# Patient Record
Sex: Female | Born: 1960 | Race: White | Hispanic: No | Smoking: Current every day smoker
Health system: Southern US, Community
[De-identification: ages and names within clinical notes are randomized; demographics above are authoritative.]

## PROBLEM LIST (undated history)

## (undated) DIAGNOSIS — K219 Gastro-esophageal reflux disease without esophagitis: Secondary | ICD-10-CM

## (undated) DIAGNOSIS — J40 Bronchitis, not specified as acute or chronic: Secondary | ICD-10-CM

## (undated) DIAGNOSIS — M5136 Other intervertebral disc degeneration, lumbar region: Secondary | ICD-10-CM

## (undated) DIAGNOSIS — M1990 Unspecified osteoarthritis, unspecified site: Secondary | ICD-10-CM

## (undated) DIAGNOSIS — F329 Major depressive disorder, single episode, unspecified: Secondary | ICD-10-CM

## (undated) DIAGNOSIS — R221 Localized swelling, mass and lump, neck: Secondary | ICD-10-CM

## (undated) DIAGNOSIS — E042 Nontoxic multinodular goiter: Secondary | ICD-10-CM

## (undated) DIAGNOSIS — E039 Hypothyroidism, unspecified: Secondary | ICD-10-CM

## (undated) DIAGNOSIS — F4310 Post-traumatic stress disorder, unspecified: Secondary | ICD-10-CM

## (undated) DIAGNOSIS — Z8719 Personal history of other diseases of the digestive system: Secondary | ICD-10-CM

## (undated) DIAGNOSIS — IMO0002 Reserved for concepts with insufficient information to code with codable children: Secondary | ICD-10-CM

## (undated) DIAGNOSIS — Z951 Presence of aortocoronary bypass graft: Secondary | ICD-10-CM

## (undated) DIAGNOSIS — G4730 Sleep apnea, unspecified: Secondary | ICD-10-CM

## (undated) DIAGNOSIS — M51369 Other intervertebral disc degeneration, lumbar region without mention of lumbar back pain or lower extremity pain: Secondary | ICD-10-CM

## (undated) DIAGNOSIS — G5603 Carpal tunnel syndrome, bilateral upper limbs: Secondary | ICD-10-CM

## (undated) DIAGNOSIS — G8929 Other chronic pain: Secondary | ICD-10-CM

## (undated) DIAGNOSIS — H269 Unspecified cataract: Secondary | ICD-10-CM

## (undated) DIAGNOSIS — G709 Myoneural disorder, unspecified: Secondary | ICD-10-CM

## (undated) DIAGNOSIS — T7840XA Allergy, unspecified, initial encounter: Secondary | ICD-10-CM

## (undated) DIAGNOSIS — F319 Bipolar disorder, unspecified: Secondary | ICD-10-CM

## (undated) DIAGNOSIS — I10 Essential (primary) hypertension: Secondary | ICD-10-CM

## (undated) DIAGNOSIS — I2510 Atherosclerotic heart disease of native coronary artery without angina pectoris: Secondary | ICD-10-CM

## (undated) DIAGNOSIS — E7800 Pure hypercholesterolemia, unspecified: Secondary | ICD-10-CM

## (undated) DIAGNOSIS — I219 Acute myocardial infarction, unspecified: Secondary | ICD-10-CM

## (undated) DIAGNOSIS — M549 Dorsalgia, unspecified: Secondary | ICD-10-CM

## (undated) DIAGNOSIS — R0989 Other specified symptoms and signs involving the circulatory and respiratory systems: Secondary | ICD-10-CM

## (undated) DIAGNOSIS — F419 Anxiety disorder, unspecified: Secondary | ICD-10-CM

## (undated) DIAGNOSIS — R9430 Abnormal result of cardiovascular function study, unspecified: Secondary | ICD-10-CM

## (undated) DIAGNOSIS — J189 Pneumonia, unspecified organism: Secondary | ICD-10-CM

## (undated) DIAGNOSIS — F32A Depression, unspecified: Secondary | ICD-10-CM

## (undated) HISTORY — DX: Reserved for concepts with insufficient information to code with codable children: IMO0002

## (undated) HISTORY — DX: Unspecified osteoarthritis, unspecified site: M19.90

## (undated) HISTORY — DX: Nontoxic multinodular goiter: E04.2

## (undated) HISTORY — DX: Dorsalgia, unspecified: M54.9

## (undated) HISTORY — PX: BACK SURGERY: SHX140

## (undated) HISTORY — PX: TUBAL LIGATION: SHX77

## (undated) HISTORY — PX: BREAST SURGERY: SHX581

## (undated) HISTORY — DX: Major depressive disorder, single episode, unspecified: F32.9

## (undated) HISTORY — DX: Localized swelling, mass and lump, neck: R22.1

## (undated) HISTORY — PX: CHOLECYSTECTOMY: SHX55

## (undated) HISTORY — PX: COLONOSCOPY: SHX174

## (undated) HISTORY — DX: Depression, unspecified: F32.A

## (undated) HISTORY — DX: Allergy, unspecified, initial encounter: T78.40XA

## (undated) HISTORY — DX: Other chronic pain: G89.29

## (undated) HISTORY — PX: BREAST EXCISIONAL BIOPSY: SUR124

## (undated) HISTORY — DX: Acute myocardial infarction, unspecified: I21.9

## (undated) HISTORY — PX: WRIST SURGERY: SHX841

## (undated) HISTORY — DX: Myoneural disorder, unspecified: G70.9

## (undated) HISTORY — DX: Unspecified cataract: H26.9

## (undated) HISTORY — DX: Other specified symptoms and signs involving the circulatory and respiratory systems: R09.89

## (undated) HISTORY — DX: Abnormal result of cardiovascular function study, unspecified: R94.30

## (undated) HISTORY — DX: Presence of aortocoronary bypass graft: Z95.1

---

## 1982-05-01 HISTORY — PX: ABDOMINAL HYSTERECTOMY: SHX81

## 1999-01-24 ENCOUNTER — Encounter: Admission: RE | Admit: 1999-01-24 | Discharge: 1999-04-24 | Payer: Self-pay

## 1999-05-17 ENCOUNTER — Encounter: Payer: Self-pay | Admitting: Emergency Medicine

## 1999-05-17 ENCOUNTER — Emergency Department (HOSPITAL_COMMUNITY): Admission: EM | Admit: 1999-05-17 | Discharge: 1999-05-17 | Payer: Self-pay | Admitting: Emergency Medicine

## 2000-10-17 ENCOUNTER — Other Ambulatory Visit: Admission: RE | Admit: 2000-10-17 | Discharge: 2000-10-17 | Payer: Self-pay | Admitting: Internal Medicine

## 2000-11-13 ENCOUNTER — Encounter: Admission: RE | Admit: 2000-11-13 | Discharge: 2000-11-13 | Payer: Self-pay | Admitting: Internal Medicine

## 2000-11-13 ENCOUNTER — Encounter: Payer: Self-pay | Admitting: Internal Medicine

## 2000-12-17 ENCOUNTER — Emergency Department (HOSPITAL_COMMUNITY): Admission: EM | Admit: 2000-12-17 | Discharge: 2000-12-17 | Payer: Self-pay | Admitting: *Deleted

## 2000-12-17 ENCOUNTER — Encounter: Payer: Self-pay | Admitting: *Deleted

## 2001-05-01 ENCOUNTER — Emergency Department (HOSPITAL_COMMUNITY): Admission: EM | Admit: 2001-05-01 | Discharge: 2001-05-01 | Payer: Self-pay | Admitting: Emergency Medicine

## 2001-05-21 ENCOUNTER — Emergency Department (HOSPITAL_COMMUNITY): Admission: EM | Admit: 2001-05-21 | Discharge: 2001-05-21 | Payer: Self-pay | Admitting: Emergency Medicine

## 2001-05-27 ENCOUNTER — Ambulatory Visit (HOSPITAL_COMMUNITY): Admission: RE | Admit: 2001-05-27 | Discharge: 2001-05-27 | Payer: Self-pay | Admitting: Orthopedic Surgery

## 2001-05-27 ENCOUNTER — Encounter: Payer: Self-pay | Admitting: Orthopedic Surgery

## 2001-05-28 ENCOUNTER — Emergency Department (HOSPITAL_COMMUNITY): Admission: EM | Admit: 2001-05-28 | Discharge: 2001-05-28 | Payer: Self-pay | Admitting: Emergency Medicine

## 2001-09-19 ENCOUNTER — Emergency Department (HOSPITAL_COMMUNITY): Admission: EM | Admit: 2001-09-19 | Discharge: 2001-09-20 | Payer: Self-pay | Admitting: Emergency Medicine

## 2002-08-02 ENCOUNTER — Ambulatory Visit (HOSPITAL_COMMUNITY): Admission: RE | Admit: 2002-08-02 | Discharge: 2002-08-02 | Payer: Self-pay | Admitting: Orthopedic Surgery

## 2002-08-02 ENCOUNTER — Encounter: Payer: Self-pay | Admitting: Orthopedic Surgery

## 2003-08-18 ENCOUNTER — Emergency Department (HOSPITAL_COMMUNITY): Admission: EM | Admit: 2003-08-18 | Discharge: 2003-08-18 | Payer: Self-pay | Admitting: Emergency Medicine

## 2003-10-07 ENCOUNTER — Emergency Department (HOSPITAL_COMMUNITY): Admission: EM | Admit: 2003-10-07 | Discharge: 2003-10-07 | Payer: Self-pay | Admitting: Emergency Medicine

## 2003-12-12 ENCOUNTER — Emergency Department (HOSPITAL_COMMUNITY): Admission: EM | Admit: 2003-12-12 | Discharge: 2003-12-12 | Payer: Self-pay | Admitting: Emergency Medicine

## 2005-09-04 ENCOUNTER — Emergency Department (HOSPITAL_COMMUNITY): Admission: EM | Admit: 2005-09-04 | Discharge: 2005-09-04 | Payer: Self-pay | Admitting: Emergency Medicine

## 2006-05-01 HISTORY — PX: CORONARY ARTERY BYPASS GRAFT: SHX141

## 2006-11-09 ENCOUNTER — Emergency Department (HOSPITAL_COMMUNITY): Admission: EM | Admit: 2006-11-09 | Discharge: 2006-11-09 | Payer: Self-pay | Admitting: Emergency Medicine

## 2006-11-20 ENCOUNTER — Ambulatory Visit: Payer: Self-pay | Admitting: Internal Medicine

## 2006-11-20 ENCOUNTER — Inpatient Hospital Stay (HOSPITAL_COMMUNITY): Admission: EM | Admit: 2006-11-20 | Discharge: 2006-11-28 | Payer: Self-pay | Admitting: Emergency Medicine

## 2006-11-20 DIAGNOSIS — I219 Acute myocardial infarction, unspecified: Secondary | ICD-10-CM

## 2006-11-20 HISTORY — DX: Acute myocardial infarction, unspecified: I21.9

## 2006-11-21 ENCOUNTER — Ambulatory Visit: Payer: Self-pay | Admitting: Cardiothoracic Surgery

## 2006-11-28 ENCOUNTER — Ambulatory Visit: Payer: Self-pay | Admitting: *Deleted

## 2006-12-19 ENCOUNTER — Ambulatory Visit: Payer: Self-pay | Admitting: Cardiology

## 2006-12-19 LAB — CONVERTED CEMR LAB
Basophils Absolute: 0 10*3/uL (ref 0.0–0.1)
Calcium: 9.2 mg/dL (ref 8.4–10.5)
Chloride: 107 meq/L (ref 96–112)
Eosinophils Absolute: 0.3 10*3/uL (ref 0.0–0.6)
Eosinophils Relative: 4.2 % (ref 0.0–5.0)
GFR calc non Af Amer: 96 mL/min
MCHC: 34.6 g/dL (ref 30.0–36.0)
MCV: 86.3 fL (ref 78.0–100.0)
Platelets: 265 10*3/uL (ref 150–400)
RBC: 4.23 M/uL (ref 3.87–5.11)
WBC: 7.1 10*3/uL (ref 4.5–10.5)

## 2006-12-21 ENCOUNTER — Encounter: Admission: RE | Admit: 2006-12-21 | Discharge: 2006-12-21 | Payer: Self-pay | Admitting: Cardiothoracic Surgery

## 2006-12-21 ENCOUNTER — Ambulatory Visit: Payer: Self-pay | Admitting: Cardiothoracic Surgery

## 2006-12-25 ENCOUNTER — Ambulatory Visit: Payer: Self-pay | Admitting: Internal Medicine

## 2006-12-28 ENCOUNTER — Ambulatory Visit: Payer: Self-pay | Admitting: Cardiothoracic Surgery

## 2007-01-04 ENCOUNTER — Ambulatory Visit: Payer: Self-pay | Admitting: Cardiothoracic Surgery

## 2007-01-18 ENCOUNTER — Ambulatory Visit: Payer: Self-pay | Admitting: Cardiology

## 2007-02-06 ENCOUNTER — Ambulatory Visit: Payer: Self-pay | Admitting: Internal Medicine

## 2007-02-06 LAB — CONVERTED CEMR LAB
ALT: 8 U/L
AST: 13 U/L
Albumin: 4.2 g/dL
Alkaline Phosphatase: 119 U/L — ABNORMAL HIGH
BUN: 16 mg/dL
CO2: 19 meq/L
Calcium: 9 mg/dL
Chloride: 110 meq/L
Cholesterol: 114 mg/dL
Creatinine, Ser: 0.7 mg/dL
Glucose, Bld: 111 mg/dL — ABNORMAL HIGH
HDL: 28 mg/dL — ABNORMAL LOW
LDL Cholesterol: 61 mg/dL
Potassium: 4 meq/L
Sodium: 142 meq/L
Total Bilirubin: 0.4 mg/dL
Total CHOL/HDL Ratio: 4.1
Total Protein: 6.9 g/dL
Triglycerides: 125 mg/dL
VLDL: 25 mg/dL

## 2007-02-14 ENCOUNTER — Ambulatory Visit: Payer: Self-pay | Admitting: Internal Medicine

## 2007-02-14 ENCOUNTER — Encounter (HOSPITAL_COMMUNITY): Admission: RE | Admit: 2007-02-14 | Discharge: 2007-05-01 | Payer: Self-pay | Admitting: Cardiology

## 2007-02-25 ENCOUNTER — Ambulatory Visit: Payer: Self-pay | Admitting: Cardiology

## 2007-04-19 ENCOUNTER — Ambulatory Visit: Payer: Self-pay

## 2007-05-01 ENCOUNTER — Ambulatory Visit (HOSPITAL_COMMUNITY): Admission: RE | Admit: 2007-05-01 | Discharge: 2007-05-02 | Payer: Self-pay | Admitting: Specialist

## 2007-05-02 ENCOUNTER — Encounter (HOSPITAL_COMMUNITY): Admission: RE | Admit: 2007-05-02 | Discharge: 2007-06-29 | Payer: Self-pay | Admitting: Cardiology

## 2007-05-02 HISTORY — PX: KNEE SURGERY: SHX244

## 2007-05-17 ENCOUNTER — Ambulatory Visit: Payer: Self-pay | Admitting: Internal Medicine

## 2007-05-17 LAB — CONVERTED CEMR LAB
Basophils Relative: 0 % (ref 0–1)
Hemoglobin: 15 g/dL (ref 12.0–15.0)
Lymphs Abs: 2.7 10*3/uL (ref 0.7–4.0)
MCHC: 32.1 g/dL (ref 30.0–36.0)
MCV: 92.1 fL (ref 78.0–100.0)
Monocytes Absolute: 0.5 10*3/uL (ref 0.1–1.0)
Monocytes Relative: 5 % (ref 3–12)
Neutro Abs: 5.9 10*3/uL (ref 1.7–7.7)
RBC: 5.07 M/uL (ref 3.87–5.11)

## 2007-08-02 ENCOUNTER — Ambulatory Visit: Payer: Self-pay | Admitting: Cardiology

## 2007-09-02 ENCOUNTER — Ambulatory Visit: Payer: Self-pay | Admitting: Internal Medicine

## 2007-09-02 ENCOUNTER — Encounter (INDEPENDENT_AMBULATORY_CARE_PROVIDER_SITE_OTHER): Payer: Self-pay | Admitting: Family Medicine

## 2007-09-03 ENCOUNTER — Ambulatory Visit (HOSPITAL_COMMUNITY): Admission: RE | Admit: 2007-09-03 | Discharge: 2007-09-03 | Payer: Self-pay | Admitting: Family Medicine

## 2008-04-17 ENCOUNTER — Ambulatory Visit: Payer: Self-pay | Admitting: Internal Medicine

## 2008-05-08 ENCOUNTER — Ambulatory Visit: Payer: Self-pay | Admitting: Cardiology

## 2008-05-21 ENCOUNTER — Ambulatory Visit: Payer: Self-pay | Admitting: Internal Medicine

## 2008-05-21 ENCOUNTER — Encounter (INDEPENDENT_AMBULATORY_CARE_PROVIDER_SITE_OTHER): Payer: Self-pay | Admitting: Adult Health

## 2008-05-21 LAB — CONVERTED CEMR LAB
Albumin: 4.1 g/dL (ref 3.5–5.2)
CO2: 18 meq/L — ABNORMAL LOW (ref 19–32)
Calcium: 9 mg/dL (ref 8.4–10.5)
Chloride: 108 meq/L (ref 96–112)
Cholesterol: 128 mg/dL (ref 0–200)
Glucose, Bld: 119 mg/dL — ABNORMAL HIGH (ref 70–99)
Sodium: 140 meq/L (ref 135–145)
Total Bilirubin: 0.6 mg/dL (ref 0.3–1.2)
Total Protein: 6.8 g/dL (ref 6.0–8.3)
Triglycerides: 153 mg/dL — ABNORMAL HIGH (ref <150)
VLDL: 31 mg/dL (ref 0–40)

## 2008-07-20 ENCOUNTER — Ambulatory Visit: Payer: Self-pay | Admitting: Internal Medicine

## 2008-08-05 ENCOUNTER — Encounter: Admission: RE | Admit: 2008-08-05 | Discharge: 2008-08-05 | Payer: Self-pay | Admitting: Internal Medicine

## 2008-09-23 ENCOUNTER — Ambulatory Visit: Payer: Self-pay | Admitting: Internal Medicine

## 2008-09-24 ENCOUNTER — Ambulatory Visit: Payer: Self-pay | Admitting: Internal Medicine

## 2008-10-01 ENCOUNTER — Ambulatory Visit (HOSPITAL_COMMUNITY): Admission: RE | Admit: 2008-10-01 | Discharge: 2008-10-01 | Payer: Self-pay | Admitting: Internal Medicine

## 2008-10-27 ENCOUNTER — Ambulatory Visit: Payer: Self-pay | Admitting: Internal Medicine

## 2008-10-29 ENCOUNTER — Ambulatory Visit: Payer: Self-pay | Admitting: Family Medicine

## 2008-11-05 ENCOUNTER — Ambulatory Visit: Payer: Self-pay | Admitting: Internal Medicine

## 2008-11-19 ENCOUNTER — Ambulatory Visit: Payer: Self-pay | Admitting: Internal Medicine

## 2008-11-20 ENCOUNTER — Emergency Department (HOSPITAL_COMMUNITY): Admission: EM | Admit: 2008-11-20 | Discharge: 2008-11-20 | Payer: Self-pay | Admitting: Emergency Medicine

## 2008-11-20 DIAGNOSIS — F17200 Nicotine dependence, unspecified, uncomplicated: Secondary | ICD-10-CM | POA: Insufficient documentation

## 2008-11-21 ENCOUNTER — Emergency Department (HOSPITAL_COMMUNITY): Admission: EM | Admit: 2008-11-21 | Discharge: 2008-11-21 | Payer: Self-pay | Admitting: Emergency Medicine

## 2008-11-27 ENCOUNTER — Ambulatory Visit: Payer: Self-pay | Admitting: Cardiology

## 2008-12-10 ENCOUNTER — Ambulatory Visit: Payer: Self-pay | Admitting: Internal Medicine

## 2009-01-13 ENCOUNTER — Encounter (INDEPENDENT_AMBULATORY_CARE_PROVIDER_SITE_OTHER): Payer: Self-pay | Admitting: Adult Health

## 2009-01-13 ENCOUNTER — Ambulatory Visit: Payer: Self-pay | Admitting: Internal Medicine

## 2009-01-13 LAB — CONVERTED CEMR LAB
ALT: 10 U/L
AST: 18 U/L
Albumin: 4.2 g/dL
Alkaline Phosphatase: 98 U/L
BUN: 18 mg/dL
CO2: 20 meq/L
Calcium: 8.9 mg/dL
Chloride: 108 meq/L
Cholesterol: 128 mg/dL
Creatinine, Ser: 0.69 mg/dL
Glucose, Bld: 142 mg/dL — ABNORMAL HIGH
HDL: 32 mg/dL — ABNORMAL LOW
LDL Cholesterol: 69 mg/dL
Potassium: 4.1 meq/L
Sodium: 140 meq/L
Total Bilirubin: 0.4 mg/dL
Total CHOL/HDL Ratio: 4
Total Protein: 6.6 g/dL
Triglycerides: 135 mg/dL
VLDL: 27 mg/dL

## 2009-01-27 ENCOUNTER — Ambulatory Visit: Payer: Self-pay | Admitting: Internal Medicine

## 2009-01-27 ENCOUNTER — Ambulatory Visit (HOSPITAL_COMMUNITY): Admission: RE | Admit: 2009-01-27 | Discharge: 2009-01-27 | Payer: Self-pay | Admitting: Internal Medicine

## 2009-01-27 ENCOUNTER — Emergency Department (HOSPITAL_COMMUNITY): Admission: EM | Admit: 2009-01-27 | Discharge: 2009-01-27 | Payer: Self-pay | Admitting: Emergency Medicine

## 2009-02-03 ENCOUNTER — Ambulatory Visit: Payer: Self-pay | Admitting: Internal Medicine

## 2009-02-11 ENCOUNTER — Ambulatory Visit: Payer: Self-pay | Admitting: Internal Medicine

## 2009-04-28 ENCOUNTER — Ambulatory Visit: Payer: Self-pay | Admitting: Internal Medicine

## 2009-04-28 ENCOUNTER — Encounter (INDEPENDENT_AMBULATORY_CARE_PROVIDER_SITE_OTHER): Payer: Self-pay | Admitting: Adult Health

## 2009-04-28 LAB — CONVERTED CEMR LAB
ALT: 9 units/L (ref 0–35)
CO2: 20 meq/L (ref 19–32)
Calcium: 9.4 mg/dL (ref 8.4–10.5)
Chloride: 106 meq/L (ref 96–112)
Cholesterol: 150 mg/dL (ref 0–200)
Creatinine, Ser: 0.66 mg/dL (ref 0.40–1.20)
Glucose, Bld: 108 mg/dL — ABNORMAL HIGH (ref 70–99)
Total Bilirubin: 0.6 mg/dL (ref 0.3–1.2)
Total CHOL/HDL Ratio: 4.7
Total Protein: 7 g/dL (ref 6.0–8.3)
Triglycerides: 207 mg/dL — ABNORMAL HIGH (ref <150)
VLDL: 41 mg/dL — ABNORMAL HIGH (ref 0–40)

## 2009-05-06 ENCOUNTER — Ambulatory Visit (HOSPITAL_COMMUNITY): Admission: RE | Admit: 2009-05-06 | Discharge: 2009-05-06 | Payer: Self-pay | Admitting: Family Medicine

## 2009-07-28 ENCOUNTER — Ambulatory Visit: Payer: Self-pay | Admitting: Internal Medicine

## 2009-09-29 ENCOUNTER — Ambulatory Visit: Payer: Self-pay | Admitting: Internal Medicine

## 2009-09-29 ENCOUNTER — Encounter (INDEPENDENT_AMBULATORY_CARE_PROVIDER_SITE_OTHER): Payer: Self-pay | Admitting: Adult Health

## 2009-09-29 LAB — CONVERTED CEMR LAB
BUN: 14 mg/dL (ref 6–23)
Basophils Relative: 1 % (ref 0–1)
CO2: 23 meq/L (ref 19–32)
Calcium: 9.9 mg/dL (ref 8.4–10.5)
Chloride: 106 meq/L (ref 96–112)
Cholesterol: 123 mg/dL (ref 0–200)
Creatinine, Ser: 0.59 mg/dL (ref 0.40–1.20)
Eosinophils Absolute: 0.1 10*3/uL (ref 0.0–0.7)
Eosinophils Relative: 1 % (ref 0–5)
Glucose, Bld: 104 mg/dL — ABNORMAL HIGH (ref 70–99)
HCT: 43.6 % (ref 36.0–46.0)
HDL: 35 mg/dL — ABNORMAL LOW (ref >39)
Lymphs Abs: 3.4 10*3/uL (ref 0.7–4.0)
MCHC: 31.9 g/dL (ref 30.0–36.0)
MCV: 89.5 fL (ref 78.0–100.0)
Monocytes Relative: 5 % (ref 3–12)
Neutrophils Relative %: 56 % (ref 43–77)
RBC: 4.87 M/uL (ref 3.87–5.11)
Total Bilirubin: 0.4 mg/dL (ref 0.3–1.2)
Total CHOL/HDL Ratio: 3.5
Triglycerides: 133 mg/dL (ref <150)
VLDL: 27 mg/dL (ref 0–40)
WBC: 9.3 10*3/uL (ref 4.0–10.5)

## 2009-09-30 ENCOUNTER — Ambulatory Visit: Payer: Self-pay | Admitting: Internal Medicine

## 2009-10-12 ENCOUNTER — Ambulatory Visit: Payer: Self-pay | Admitting: Internal Medicine

## 2009-10-13 ENCOUNTER — Ambulatory Visit: Payer: Self-pay | Admitting: Internal Medicine

## 2009-11-03 ENCOUNTER — Ambulatory Visit: Payer: Self-pay | Admitting: Cardiology

## 2009-12-28 ENCOUNTER — Ambulatory Visit: Payer: Self-pay | Admitting: Internal Medicine

## 2009-12-29 ENCOUNTER — Ambulatory Visit: Payer: Self-pay | Admitting: Family Medicine

## 2010-02-08 ENCOUNTER — Ambulatory Visit (HOSPITAL_COMMUNITY): Admission: RE | Admit: 2010-02-08 | Discharge: 2010-02-08 | Payer: Self-pay | Admitting: Family Medicine

## 2010-04-26 ENCOUNTER — Encounter (INDEPENDENT_AMBULATORY_CARE_PROVIDER_SITE_OTHER): Payer: Self-pay | Admitting: *Deleted

## 2010-04-26 LAB — CONVERTED CEMR LAB
ALT: <8 units/L (ref 0–35)
AST: 13 units/L (ref 0–37)
Albumin ELP: 58.2 % (ref 55.8–66.1)
Alpha-1-Globulin: 5.4 % — ABNORMAL HIGH (ref 2.9–4.9)
Anti Nuclear Antibody(ANA): NEGATIVE
Beta Globulin: 6.5 % (ref 4.7–7.2)
CO2: 23 meq/L (ref 19–32)
Calcium: 9.2 mg/dL (ref 8.4–10.5)
Chloride: 109 meq/L (ref 96–112)
Creatinine, Ser: 0.7 mg/dL (ref 0.40–1.20)
HCV Ab: NEGATIVE
Hep B Core Total Ab: NEGATIVE
Potassium: 3.6 meq/L (ref 3.5–5.3)
Sed Rate: 19 mm/hr (ref 0–22)
Sodium: 142 meq/L (ref 135–145)
Total Protein, Serum Electrophoresis: 6.3 g/dL (ref 6.0–8.3)
Total Protein: 6.3 g/dL (ref 6.0–8.3)

## 2010-05-22 ENCOUNTER — Encounter: Payer: Self-pay | Admitting: Cardiothoracic Surgery

## 2010-05-23 ENCOUNTER — Encounter: Payer: Self-pay | Admitting: Internal Medicine

## 2010-05-31 NOTE — Assessment & Plan Note (Signed)
Summary: f1y   Visit Type:  1 year follow up  CC:  No cardiac complains.  History of Present Illness: Stable at current time.  Doing reasonably well, and staying active.  Unfortunately, continues to smoke.  Restarted when her daughter got sick, and has not been able to quit.  Does stay very active.    Current Medications (verified): 1)  Fish Oil 1200 Mg Caps (Omega-3 Fatty Acids) .... Take 1 Capsule By Mouth Once A Day 2)  Aspirin 81 Mg Tbec (Aspirin) .... Take One Tablet By Mouth Daily 3)  Toprol Xl 25 Mg Xr24h-Tab (Metoprolol Succinate) .... Take 1 Tablet By Mouth Once A Day 4)  Lipitor 40 Mg Tabs (Atorvastatin Calcium) .... Take One Tablet By Mouth Daily. 5)  Claritin 10 Mg Tabs (Loratadine) .... As Needed 6)  Neurontin 100 Mg Caps (Gabapentin) .... Take 1 Capsule By Mouth Two Times A Day 7)  Calcium Carbonate-Vitamin D 600-400 Mg-Unit  Tabs (Calcium Carbonate-Vitamin D) .... Take 1 Tablet By Mouth Two Times A Day 8)  Multivitamins   Tabs (Multiple Vitamin) .... Take 1 Tablet By Mouth Once A Day 9)  Cymbalta 60 Mg Cpep (Duloxetine Hcl) .... Take 1 Capsule By Mouth Once A Day 10)  Fish Oil 1000 Mg Caps (Omega-3 Fatty Acids) .... Take 1 Capsule By Mouth Two Times A Day  Allergies: 1)  ! Morphine 2)  ! Ibuprofen 3)  ! * Steroids By Mouth 4)  ! * Bees  Past History:  Past Medical History: Last updated: 11/20/2008 Current Problems:  CAD (ICD-414.00)- HYPERCHOLESTEROLEMIA (ICD-272.0) DEPRESSION (ICD-311) BACK PAIN, CHRONIC (ICD-724.5) TOBACCO ABUSE (ICD-305.1)  Past Surgical History: Last updated: 11/20/2008   Left knee arthroscopy- 2008  coronary artery bypass grafting x3 (left internal mammary artery to LAD, saphenous vein graft to RV marginal, saphenous vein graft to posterior descending).  -2008  hysterectomy  jaw surgery.        Family History: Last updated: 11/20/2008  Notable for coronary artery disease in both her mother   and her father.   Social  History: Last updated: 11/20/2008  She is married.  She has a39 year old daughter who has   recurrent cancer.  She smokes half a pack of cigarettes per day for the   past 25 years.  She works as an Airline pilot.      Vital Signs:  Patient profile:   50 year old female Height:      61 inches Weight:      157 pounds BMI:     29.77 Pulse rate:   66 / minute Pulse rhythm:   regular Resp:     18 per minute BP sitting:   106 / 60  (left arm) Cuff size:   large  Vitals Entered By: Vikki Ports (November 03, 2009 12:22 PM)  Physical Exam  General:  alert, young female in NAD. Head:  normocephalic and atraumatic Eyes:  PERRLA/EOM intact; conjunctiva and lids normal. Neck:  No bruits. Lungs:  Clear bilaterally to auscultation and percussion. Heart:  PMI non displaced. Normal S1 and S2.  Without murmur or rub.  Extremities:  birth mark. R leg.   EKG  Procedure date:  11/03/2009  Findings:      NSR. Inferior MI, indeterminate age.   Impression & Recommendations:  Problem # 1:  CAD, ARTERY BYPASS GRAFT (ICD-414.04)  stable at present.  Continues on medical therpay.  Role of smoking reviewed with patient in detail . The following medications were removed from the  medication list:    Plavix 75 Mg Tabs (Clopidogrel bisulfate) .Marland Kitchen... Take one tablet by mouth daily Her updated medication list for this problem includes:    Aspirin 81 Mg Tbec (Aspirin) .Marland Kitchen... Take one tablet by mouth daily    Toprol Xl 25 Mg Xr24h-tab (Metoprolol succinate) .Marland Kitchen... Take 1 tablet by mouth once a day  Orders: EKG w/ Interpretation (93000)  Problem # 2:  HYPERCHOLESTEROLEMIA (ICD-272.0)  followed at [facility].  LDL 77, HDL 32. Her updated medication list for this problem includes:    Lipitor 40 Mg Tabs (Atorvastatin calcium) .Marland Kitchen... Take one tablet by mouth daily.  Orders: EKG w/ Interpretation (93000)  Problem # 3:  TOBACCO ABUSE (ICD-305.1) still struggling with this.  See above.  Discussed the  importance in office.    Patient Instructions: 1)  Your physician recommends that you continue on your current medications as directed. Please refer to the Current Medication list given to you today. 2)  Your physician wants you to follow-up in:   1 YEAR. You will receive a reminder letter in the mail two months in advance. If you don't receive a letter, please call our office to schedule the follow-up appointment.

## 2010-08-10 ENCOUNTER — Emergency Department (HOSPITAL_COMMUNITY)
Admission: EM | Admit: 2010-08-10 | Discharge: 2010-08-10 | Disposition: A | Discharge status: Home / Self Care | Payer: Self-pay | Attending: Emergency Medicine | Admitting: Emergency Medicine

## 2010-08-10 ENCOUNTER — Inpatient Hospital Stay (INDEPENDENT_AMBULATORY_CARE_PROVIDER_SITE_OTHER)
Admission: RE | Admit: 2010-08-10 | Discharge: 2010-08-10 | Disposition: A | Discharge status: Home / Self Care | Payer: Self-pay | Source: Ambulatory Visit | Attending: Emergency Medicine | Admitting: Emergency Medicine

## 2010-08-10 DIAGNOSIS — M79609 Pain in unspecified limb: Secondary | ICD-10-CM | POA: Insufficient documentation

## 2010-08-10 DIAGNOSIS — E119 Type 2 diabetes mellitus without complications: Secondary | ICD-10-CM | POA: Insufficient documentation

## 2010-08-10 DIAGNOSIS — I10 Essential (primary) hypertension: Secondary | ICD-10-CM | POA: Insufficient documentation

## 2010-08-10 DIAGNOSIS — I8000 Phlebitis and thrombophlebitis of superficial vessels of unspecified lower extremity: Secondary | ICD-10-CM | POA: Insufficient documentation

## 2010-08-10 DIAGNOSIS — I252 Old myocardial infarction: Secondary | ICD-10-CM | POA: Insufficient documentation

## 2010-08-23 ENCOUNTER — Telehealth: Payer: Self-pay | Admitting: Cardiology

## 2010-08-23 ENCOUNTER — Ambulatory Visit (HOSPITAL_COMMUNITY)
Admission: RE | Admit: 2010-08-23 | Discharge: 2010-08-23 | Disposition: A | Discharge status: Home / Self Care | Payer: Self-pay | Source: Ambulatory Visit | Attending: Family Medicine | Admitting: Family Medicine

## 2010-08-23 DIAGNOSIS — M79609 Pain in unspecified limb: Secondary | ICD-10-CM | POA: Insufficient documentation

## 2010-08-23 DIAGNOSIS — M7989 Other specified soft tissue disorders: Secondary | ICD-10-CM | POA: Insufficient documentation

## 2010-08-23 DIAGNOSIS — I82819 Embolism and thrombosis of superficial veins of unspecified lower extremities: Secondary | ICD-10-CM | POA: Insufficient documentation

## 2010-08-23 NOTE — Telephone Encounter (Signed)
Left message for pt to call back  °

## 2010-08-23 NOTE — Telephone Encounter (Signed)
I spoke with the pt and she developed knots in her right leg about 2 weeks ago.  The pt did go to the ER for evaluation and was diagnosed with superficial thrombophlebitis.  The had a repeat doppler today with [facility] and was started on Coumadin 5mg  by Dr Clelia Croft at [facility].  The pt will be followed in the coumadin clinic at [facility].  They are attempting to get the pt an appt with physician at [facility].  The pt was calling our office to see if she needed an appt with Dr Riley Kill.  I made her aware that at this time she could continue follow-up with Dr Clelia Croft and [facility].  The pt will call back to make an appt with Dr Riley Kill in July.

## 2010-08-23 NOTE — Telephone Encounter (Signed)
Pt calling with info for nurse re some problems she been having

## 2010-08-25 ENCOUNTER — Encounter (INDEPENDENT_AMBULATORY_CARE_PROVIDER_SITE_OTHER): Payer: Self-pay | Admitting: Vascular Surgery

## 2010-08-25 DIAGNOSIS — I8000 Phlebitis and thrombophlebitis of superficial vessels of unspecified lower extremity: Secondary | ICD-10-CM

## 2010-08-26 NOTE — Assessment & Plan Note (Signed)
OFFICE VISIT  [name], [name] M DOB:  [birth year]                                       08/25/2010 GMWNU#:[id number]  CHIEF COMPLAINT:  Right leg pain.  HISTORY OF PRESENT ILLNESS:  The patient is a 50 year old female referred by Dr. Clelia Croft with [facility] for evaluation of right lower extremity pain.  The patient has apparently had recent episodes of superficial thrombophlebitis in the right leg.  The patient noted she had developed several knots in her leg over the last few weeks.  She had a venous duplex ultrasound performed on April 11 and on April 24.  This showed no evidence of DVT but multiple areas of thrombosed superficial varicosities.  The pain has increased over the last 2 weeks.  She has currently been taking Percocet for the pain with some relief.  The patient has a history of long-term chronic right leg swelling.  This has been intermittent in character in the past.  She has no prior history of DVT.  She has no family history of hypercoagulable state. She denies any prior right leg trauma or operations.  She did have her left saphenous vein used for coronary artery bypass grafting in 2009. She was recently placed on Coumadin by Dr. Clelia Croft for these multiple superficial thrombosed varicosities.  The patient has a history of a "birth mark" in this right lower extremity which is unchanged.  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  PAST MEDICAL HISTORY:  Is also significant for hypertension, elevated cholesterol and coronary artery disease.  She had coronary artery bypass grafting in 2008.  She also has a history of neuropathy and chronic back pain and is on disability for this.  PAST SURGICAL HISTORY:  Coronary artery bypass grafting, left knee operation.  SOCIAL HISTORY:  She is married, she has 2 children.  She currently smokes  half a pack of cigarettes per day.  She does not consume alcohol regularly.  FAMILY HISTORY:  Coronary artery disease at early age in her mother and father.  REVIEW OF SYSTEMS:  Height is 5 feet 1 inch, 150 pounds. VASCULAR:  As described above. CARDIAC:  She has a history of chest pain but not recently. ENT:  She has had some decline in her eyesight. PSYCHIATRIC:  She has mild depression and anxiety. All other systems were negative.  MEDICATIONS: 1. Lipitor 20 mg once a day. 2. Toprol 40 mg once a day. 3. Neurontin 200 mg t.i.d. 4. Coumadin 5 mg once daily. 5. Fish Oil 1200 mg twice a day. 6. Tramadol 50 mg four times a day. 7. Percocet 10/325 as needed for pain.  ALLERGIES:  She has allergies listed to oral steroids which apparently have caused angioedema; morphine which caused nausea and vomiting; ibuprofen which caused a rash.  PHYSICAL EXAM:  Vital signs:  Blood pressure is 101/58 in the right arm, heart rate 71 and regular.  Temperature is 97.9.  HEENT:  Unremarkable. Neck:  Has 2+ carotid pulses without bruit.  Chest:  Clear to auscultation.  Cardiac:  Regular rate and rhythm without murmur. Abdomen:  Soft, nontender, nondistended.  No masses.  Extremities:  She has 2+ femoral and 2+ dorsalis pedis pulses bilaterally.  The right calf is warm to the touch  compared to the left.  There are a large cluster of posterior medial varicosities in the calf which are slightly tender to palpation.  These are not present in the left lower extremity.  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  Neurological:  Shows symmetric upper extremity and lower extremity motor strength which is 5/5.  Skin:  Has no open ulcers or rashes.  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  I believe the best course of action for right now is conservative management to  try to decrease the inflammation and see if her symptoms will improve somewhat.  Although she is at some slight risk of increased bleeding, I believe she does need a course of anti-inflammatory and I am going to start her on Naprosyn 250 mg twice a day today.  She will follow up with me in 2-3 weeks.  As the inflammation decreases we will get a venous reflux exam to see if she has evidence of incompetency and also make sure that the deep system is patent.  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  If she continues to be symptomatic we would consider excision of some of the varicosities in her right leg for symptomatic relief.  We will make sure that she fails conservative measures though before considering this.    Janetta Hora. Fields, MD Electronically Signed  CEF/MEDQ  D:  08/25/2010  T:  08/26/2010  Job:  [id number]

## 2010-09-13 NOTE — Cardiovascular Report (Signed)
[name], [name]                ACCOUNT NO.:  [id number]   MEDICAL RECORD NO.:  [id number]          PATIENT TYPE:  INP   LOCATION:  2399                         FACILITY:  [facility]   PHYSICIAN:  Arturo Morton. Riley Kill, MD, FACCDATE OF BIRTH:  1960-07-09   DATE OF PROCEDURE:  11/21/2006  DATE OF DISCHARGE:                            CARDIAC CATHETERIZATION   INDICATIONS:  The patient is a 50 year old female with multiple issues,  who presented to the emergency room with evidence of an acute inferior  wall myocardial infarction after developing chest pain at home.  She was  seen by Dr. Gala Romney, and brought promptly and emergently to the  cardiac catheterization laboratory for further evaluation.  In the  laboratory, she was quickly prepped, and consent was obtained by verbal  consent.   PROCEDURES:  1. Left heart catheterization.  2. Selective coronary arteriography.  3. Selective left ventriculography.  4. Aortic root aortography.  5. Percutaneous angioplasty and stenting of the mid and proximal right      coronary artery.   DESCRIPTION OF PROCEDURE:  The patient was brought promptly to the  cardiac catheterization laboratory, and seen by Dr. Gala Romney as  described above.  She was prepped and draped in the usual fashion, and  through an anterior puncture the right femoral artery was easily  entered.  A 6-French sheath was initially placed.  We then performed  views of the left coronary artery with standard Judkins catheters.  This  was followed by use of a guiding catheter for RCA angiography.  This  demonstrated a high-grade stenosis in the mid right coronary.  She had  received previously intravenous heparin and ACT was checked and found to  be appropriate.  Chewable aspirin and oral clopidogrel were  administered, the first on transfer to [place]. [facility].  The patient received bivalirudin.  ACT from the heparin was nearly  therapeutic.  The lesion was crossed  with a Prowater wire.  Initial  inflation was performed with a 2 mm balloon.  This was followed by a 2  mm x 12 Maverick.  Even with low level inflations, the patient developed  a retrograde dissection extending from the lesion to what appeared to be  back to the ostium.  This was reviewed by Dr. Donata Clay and myself.  We  initially dilated then with a 3 mm balloon, and the mid vessel was  subsequently stented using a 3 x 23 Vision stent.  There still appeared  to be adventitial staining, and this appeared to extend up into the  adventitial surface overlying the ostium.  We used a 3.5 x 32 Liberte  stent then to stent across into the previously stented area, and also  back to and crossing the ostium.  With this, there was marked  improvement in the appearance of the artery.  There was some adventitial  staining that appeared to be right at the aortic root, we post dilated  with a 4 mm balloon and Dr. Donata Clay came to the laboratory to review  the films with [place].  Post dilatation was then  done with a 4 mm balloon  and there was excellent antegrade flow with resolution of any EKG  changes.  Moreover, the patient was kept in the laboratory for nearly an  hour, and there was no evidence of significant progression of the  adventitial staining around the ostium.  Left ventriculography was then  performed in the RAO projection followed by aortic root aortography.  Dr. Donata Clay and I agree the patient should be treated medically at  this point, with a follow-up CT in 24-48 hours.  I then explained the  procedure to the family.  Following this, the femoral sheath was sewn  into place after removal of all catheters.  She was taken to the  coronary care unit in satisfactory clinical condition.   HEMODYNAMIC DATA:  1. Central aortic pressure initially was 125/69, mean 91.  2. Left ventricular pressure 173/23.  3. No gradient on pullback across aortic valve.   ANGIOGRAPHIC DATA:  1.  Ventriculography in the RAO projection reveals an area of      inferobasal hypo to akinesis.  Ejection fraction estimate would be      in the range of 45%.  There appears to be at least 1+ mitral      regurgitation.  2. The aortic root demonstrates slight contrast hang-up over what      appears to be the adventitial space overlying the ostium of the      right coronary.  There does not appear to be any evidence of      significant aortic dissection, and there is no evidence of aortic      regurgitation.  3. The left main is free of critical disease.  4. The left anterior descending artery demonstrates some what appears      to be vasoconstriction.  There is a 50% area of narrowing just      proximal to the septal perforator.  The distal vessel has mild      luminal irregularity.  5. The circumflex has a marginal that has a superior branch with about      50% ostial narrowing and inferior branch with 30% narrowing.  There      is an AV circumflex without critical disease.  6. The right coronary artery is a fairly large-caliber vessel.  There      is subtotal occlusion in the midvessel.  Following balloon      dilatation with a 2 and then a 2.5 Maverick, there appeared to be      retrograde dissection from the lesion site extending back to the      ostium.  This whole area was entirely covered with two overlapping      stents.  The mid stent was a 3.5 x 23 Vision stent and the proximal      stent was a 3.5 x 32 Liberte stent.  There was a generous area of      overlap.  The stents were aggressively post dilated using 4 mm      Quantum Maverick balloon.  There was good runoff into the distal      vessel with what appeared to be TIMI-3 flow.   CONCLUSION:  1. 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  2. 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  3. No evidence of significant aortic dissection.  4. Mild to moderate reduction in left ventricular function with an      inferobasal wall motion abnormality and concomitant mitral      regurgitation.   PLAN:  1. A CT will be obtained at 24-48 hours to ensure the integrity of the      aorta.  2. Aspirin and Plavix will be given according to protocol.  3. Cardiac rehab will be initiated.  4. Risk factor modification will be necessary to improve long-term      outcome.      Arturo Morton. Riley Kill, MD, [facility]  Electronically Signed     TDS/MEDQ  D:  11/21/2006  T:  11/21/2006  Job:  [id number]   cc:   Bevelyn Buckles. Bensimhon, MD  [facility]

## 2010-09-13 NOTE — Op Note (Signed)
NAMEKANESHA, [name]                ACCOUNT NO.:  [id number]   MEDICAL RECORD NO.:  [id number]          PATIENT TYPE:  INP   LOCATION:  2316                         FACILITY:  [facility]   PHYSICIAN:  Kerin Perna, M.D.  DATE OF BIRTH:  01/02/1961   DATE OF PROCEDURE:  11/21/2006  DATE OF DISCHARGE:                               OPERATIVE REPORT   OPERATIONS:  1. Emergency coronary artery bypass grafting x3 (left internal mammary      artery to LAD, saphenous vein graft to RV marginal, saphenous vein      graft to posterior descending).  2. Endoscopic vein harvest of the greater saphenous vein from the left      leg.   SURGEON:  Kerin Perna, M.D.   FIRST ASSISTANT:  Salvatore Decent. Dorris Fetch, M.D.   SECOND ASSISTANT:  Rowe Clack P.A.-C.Marland Kitchen   PREOPERATIVE DIAGNOSIS:  Acute inferior wall myocardial infarction, with  reocclusion of right coronary following percutaneous coronary  intervention, complicated by dissection.   POSTOPERATIVE DIAGNOSIS:  Acute inferior wall myocardial infarction,  with reocclusion of right coronary following percutaneous coronary  intervention, complicated by dissection.   ANESTHESIA:  General.   INDICATIONS:  The patient is a 50 year old white female smoker who  presented on the evening of November 20, 2006 with acute chest pain, nausea,  and shortness of breath, and EKG changes of an inferior MI.  She went  directly to the catheterization lab, where the right coronary was opened  with a PCI by Dr. Riley Kill.  This was associated with a probable  dissection of the right coronary which was treated with two 23 mm  coronary stents.  She remained stable for the next 5 hours, but then  developed chest pain with EKG changes and was found in the  catheterization lab to have occlusion of the right coronary.  This was  opened with percutaneous intervention.  However, it was not felt that  this would result on a long-term a stable outcome.  Emergency surgical  revascularization was discussed with Dr. Riley Kill, and it was his  recommendation that we proceed with that at this time due to the high  risk of rethrombosis of the stented vessel.  I discussed the situation  the patient in the catheterization lab and also reviewed the situation  with the patient's family in the waiting room and discussed the  indications, benefits, and alternatives of emergency heart bypass  surgery for coronary disease.  She also had a 50% stenosis of the  proximal to mid-LAD.  They understood that because of her high dose of  anticoagulation including Plavix, Angiomax, and Integrilin, that she  would be at risk for postoperative bleeding and transfusion  requirements.  Both patient and family demonstrated their understanding  agreed to proceed with surgery under what I felt was an informed  consent.   OPERATIVE FINDINGS:  Transesophageal echo showed the inferior wall to be  hypokinetic.  There was 1+ mitral regurgitation.  Following placement of  the bypass grafts, LV function was baseline, but the hemodynamics were  very stable.  The patient  received a platelet transfusion in the  operating room for persistent coagulopathy after reversal of heparin  with protamine.   PROCEDURE:  The patient was brought directly from the catheterization  lab the operating room, where general anesthesia was induced, and a  transesophageal 2D echocardiogram was performed by the anesthesiologist.  The chest was prepped and draped, as was the rest of the body, and the  patient underwent a sternal incision with endoscopic vein harvest from  the left leg simultaneously.  The left internal mammary artery was  harvested as a pedicle graft from its origin at the subclavian vessels.  The sternal retractor was placed, and the pericardium was opened and  suspended.  After the vein had been harvested and inspected and found be  adequate, the patient was given heparin, and pursestrings were placed  in  the ascending aorta and right atrium.  The patient was cannulated and  placed on bypass.  The coronaries were identified for grafting.  The  posterior descending was a small vessel, 1 mm, but graftable.  The RV  marginal was slightly bigger, 1.2 mm vessel, graftable, and the LAD was  a 1.5 mm vessel.  The inferior wall was carefully inspected and there is  no evidence of hemorrhage or edema or old scarring as well.  Cardioplegia catheters were placed for both antegrade and retrograde  cold blood cardioplegia, and the patient was cooled to 32 degrees.  The  aortic crossclamp was applied, and cardioplegia was delivered.  There  was good cardioplegic arrest, with  septal temperature less than the 12  degrees.   The distal coronary anastomoses were performed.  The first distal  anastomosis was the RV marginal branch on the right.  This was a large  1.25 mm vessel, with proximal disease and prior occlusion.  A reverse  saphenous vein was sewn end-to-side with running 7-0 Prolene, and there  was good flow through graft.  The second distal anastomosis was the  posterior descending branch which was actually in the posterolateral  distribution on the inferolateral wall.  A reverse saphenous vein was  sewn end-to-side with a running 8-0 Prolene to this 1 mm vessel, with  adequate flow through graft, and cardioplegia was redosed.  The third  distal anastomosis was to the distal third of the LAD, where there was a  1.5-mm vessel.  More proximally, it was intramyocardial.  The left IMA  pedicle was brought through an opening created in the left lateral  pericardium.  It was brought down onto the LAD and sewn end-to-side with  a running 8-Prolene Prolene.  There was good flow through the  anastomosis after briefly releasing the pedicle bulldog on the mammary  artery.  The bulldog was reapplied, and the pedicle was secured to the  epicardium.  Cardioplegia was redosed.   While the crossclamp was  still in place, two proximal vein anastomoses  were placed on the ascending aorta using a 4 mm punch with a running 6-0  Prolene.  Prior to tying down the final proximal anastomosis, air was  vented from the coronaries using a dose of retrograde warm blood  cardioplegia (Hot Shot).  The final proximal anastomosis was tied down,  and the crossclamp was removed.   The heart resumed a spontaneous rhythm.  Air was aspirated from the vein  grafts with a 27-gauge needle, and these were opened and they had good  flow.  Hemostasis was documented at the proximal and distal anastomoses.  The patient  was rewarmed to 37 degrees.  Temporary pacing wires were  applied.  The lungs re-expanded, and the ventilator was resumed.  The  patient was weaned from bypass without difficulty, with stable  hemodynamics.  Protamine was administered without adverse reaction.  The  cannulas were removed.  The mediastinum was irrigated with warm  antibiotic irrigation.  The leg incision was irrigated and closed in a  standard fashion.  The pericardium was closed superiorly.  Two  mediastinal and a left pleural chest tube were placed and brought out  through separate  incisions.  The sternum was closed with interrupted steel wire.  The  pectoralis fascia was closed with a running #1 Vicryl.  The subcutaneous  and skin layers were closed with a running Vicryl, and sterile dressings  were applied.  Total bypass time was 95 minutes.  The crossclamp time  was 60 minutes.      Kerin Perna, M.D.  Electronically Signed     PV/MEDQ  D:  11/21/2006  T:  11/22/2006  Job:  [id number]   cc:   Arturo Morton. Riley Kill, MD, [facility]  [facility] Office

## 2010-09-13 NOTE — Assessment & Plan Note (Signed)
[facility]                            CARDIOLOGY OFFICE NOTE   NAME:[name], [name]                       MRN:          [id number]  DATE:12/19/2006                            DOB:          06/25/1960    HISTORY OF PRESENT ILLNESS:  [name] is in today for a follow up visit.  In general, she has been stable.  She has had some sharp right sided  chest discomfort which has been going on since her surgery.  The patient  presented with an acute infarction and underwent stenting of the right  coronary artery and a moderately complicated procedure.  She developed  early stent thrombosis and was reopened.  However, we felt that the risk  of rethrombosis was relatively high, and we recommended going ahead and  performing emergent coronary artery bypass graft surgery.  She generally  did well with this and is slowly and gradually improved.  A point of  emphasis has been to get her to not smoke.  She has had chronic low back  pain as well and some depression.  Since discharge from the hospital,  she has gradually improved.   CURRENT MEDICATIONS:  1. Plavix 75 mg daily.  2. Paxil 20 mg daily.  3. Fish oil 1200 mg daily.  4. Aspirin 81 mg daily.  5. Toprol XL 25 mg daily.  6. Lipitor 40 mg daily.   PHYSICAL EXAMINATION:  GENERAL:  She is alert and oriented in no acute  distress.  There is some tenderness over the chest and the median  sternotomy is well-healed.  EXTREMITIES:  Do not reveal significant edema.   STUDIES:  Electrocardiogram demonstrates normal sinus rhythm with T wave  inversion in V3-6 and the inferior leads but preserved R waves in the  inferior leads with small inferior cues.   Hemoglobin 12.6, hematocrit 36.5, white cell count 7100.  BUN and  creatinine are normal with potassium of 4.0.   IMPRESSION:  1. Status post emergent cardiac coronary artery bypass graft surgery      for recurrent stent thrombosis following percutaneous coronary      angioplasty.  2. Depression.  3. History of tobacco use.  4. Question of hyperthrombotic state or nonresponsiveness to      antiplatelet agents, although, currently doing well.   PLAN:  1  Return to clinic in 4-6 weeks.  1. Follow up with cardiac surgeons.  2. Encouraged discontinuation of smoking.     Arturo Morton. Riley Kill, MD, [facility]  Electronically Signed    TDS/MedQ  DD: 01/18/2007  DT: 01/18/2007  Job #: [id number]

## 2010-09-13 NOTE — Assessment & Plan Note (Signed)
[facility]                            CARDIOLOGY OFFICE NOTE   NAME:[name]                       MRN:          [id number]  DATE:02/25/2007                            DOB:          1960-06-22    [name] is in today for a followup visit. To briefly summarize, she  is really doing pretty well. She denies any ongoing chest pain. She was  in rehab and not having any major problems. She had a lipid profile done  with a cholesterol of 114, HDL of 28 and LDL of 61. Glucose was 111 and  alkaline phosphatase was slightly elevated. She has been in rehab  without much difficulty.   MEDICATIONS:  1. Plavix 75 mg daily.  2. Paxil 20 mg daily.  3. Fish oil 1200 mg daily.  4. Aspirin 81 mg daily.  5. Toprol XL 25 mg daily.  6. Lipitor 40 mg q nightly.   PHYSICAL EXAMINATION:  Blood pressure 120/70, pulse 68.  The lung fields are clear.  Cardiac rhythm is regular. There is no murmur, rub or gallop noted.  EXTREMITIES: Reveal no edema.   IMPRESSION:  1. Coronary disease status post emergency coronary artery bypass graft      surgery after abrupt re-closure in the setting of myocardial      infarction.  2. Hypercholesterolemia, on lipid lowering therapy.  3. Former tobacco user.   PLAN:  Return to clinic in six weeks for a stress test.     Arturo Morton. [name] Kill, MD, [facility]  Electronically Signed    TDS/MedQ  DD: 02/25/2007  DT: 02/25/2007  Job #: [id number]

## 2010-09-13 NOTE — Assessment & Plan Note (Signed)
[facility]                            CARDIOLOGY OFFICE NOTE   NAME:[name], SARAHANNE NOVAKOWSKI                       MRN:          [id number]  DATE:01/18/2007                            DOB:          1960-06-01    [name] is in for a followup visit.  In general, she has really been quite  stable.  She has not been having any ongoing chest pain.  She feels  really quite well.  She underwent revascularization surgery.  She had a  stitch incision infection at the distal end of her sternum, but this was  treated with antibiotics and is essentially healed, and Dr. Donata Clay  has turned her over to our care at this point.  She has only smoked  once, and it made her sick so she stopped.  She does need arthroscopic  surgery, but Dr. Donata Clay told her this should be put off until  November and I agree.  She is now on Ultram.   MEDICATIONS:  1. Plavix 75 mg daily.  2. Paxil 20 mg daily.  3. Fish oil daily.  4. Aspirin 81 mg daily.  5. Toprol XL 25 mg daily.  6. Lipitor 40 daily.   PHYSICAL EXAMINATION:  The blood pressure is 108/70, the pulse is 60.  The lung fields, really, are quite clear.  The median sternotomy is healing nicely.  There is no obvious stitch infection at this point in time.  The extremities reveal no edema.   No EKG was done today.   IMPRESSION:  1. Coronary artery disease, status post acute inferior wall infarction      treated with stenting and subsequent urgent revascularization for      surgery for early stent thrombosis.  2. Hypercholesterolemia, on lipid-lowering therapy.  3. Former smoker.  4. Orthopedic issues.   DISPOSITION:  1. I think it would be safe for her to stop Plavix at the present      time.  2. Return to clinic in approximately 6 weeks.  3. We will do preoperative evaluation at that time.  4. Repeat chest x-ray when she returns in 6 weeks to review prior      nodule and left ventricular bulge.     Arturo Morton.  Riley Kill, MD, [facility]  Electronically Signed    TDS/MedQ  DD: 01/18/2007  DT: 01/18/2007  Job #: [id number]

## 2010-09-13 NOTE — Discharge Summary (Signed)
[name], [name]                ACCOUNT NO.:  [id number]   MEDICAL RECORD NO.:  [id number]          PATIENT TYPE:  INP   LOCATION:  2030                         FACILITY:  [facility]   PHYSICIAN:  Kerin Perna, M.D.  DATE OF BIRTH:  [birth year]   DATE OF ADMISSION:  11/20/2006  DATE OF DISCHARGE:  11/28/2006                               DISCHARGE SUMMARY   HISTORY OF PRESENT ILLNESS:  The patient is a 50 year old female with no  previous cardiac history.  [name] does have a history of ongoing tobacco  use, depression, chronic low back pain on multiple narcotics.  [name] has a  strong family history of coronary artery disease, both in her mother and  father.  [name] has undergone recent jaw surgery, on the day prior to  admission, to replace some missing bone, which [name] states had been  depleted due to her chronic narcotic medications.  On the date of  admission, [name] had an onset of severe chest pain associated with  shortness of breath and was promptly seen by EMS.  An EKG, in the field,  showed inferior ST elevation with some minor posterior involvement.  On  arrival to the emergency room, [name] was pain free with resolution of her  ST segment elevation.  [name] was seen promptly by cardiology with the  initial assessment of an acute inferior posterior ST elevation  myocardial infarction and was taken promptly to the cardiac  catheterization lab.   REVIEW OF SYSTEMS:  Please see the history and physical noted at the  time of admission.   PAST MEDICAL HISTORY:  1. Notable for chronic low back pain on multiple narcotics pending      surgery.  2. Recent jaw surgery.  3. Depression.  4. Ongoing tobacco use, 1/2 a pack a day x20 years.   MEDICATIONS PRIOR TO ADMISSION:  Included, although may not have been  complete, Valium, Darvocet, Paxil, penicillin, hydromorphone.   ALLERGIES:  [name], DUE TO NAUSEA.   SOCIAL HISTORY:  Please see the history and physical done at the  time of  admission.   FAMILY HISTORY:  Please see the history and physical done at the time of  admission.   PHYSICAL EXAMINATION:  Please see the history and physical done at the  time of admission.   HOSPITAL COURSE:  The patient was taken promptly to the cardiac  catheterization lab by Dr. Riley Kill.  A PTCA and stent of the right  coronary artery was done with a 99% lesion going to 0%.  [name] did have  evidence of retrograde dissection of the right coronary with a lesion  back to the ostium after dilation.  The artery was stented and Dr. Donata Clay of cardiac surgery was consulted.  The PTCA showed a small area of  ostial and root staining, which did not progress over an hour.  A root  injection showed no ascending dissection with unobstructive flow to the  right coronary artery stent.  The patient, unfortunately, developed  recurrent chest pain.  Additionally, [name] had recurrent ST elevation and  was taken back to the catheterization lab.  The patient was found to  have a proximal occlusion, as well as some bifurcated dissection below  the stent with possible extension to both the AV and acute marginal  arteries.  A repeat PTCA of the proximal and distal RCA was done.  Reestablishment of TIMI III flow was obtained.  Dr. Donata Clay was again  reconsulted and emergent surgery was felt to be the best option for  proceeding.   PROCEDURE:  On November 21, 2006, [name] was taken promptly from the cardiac  catheterization lab to the cardiac OR, where [name] underwent the following  procedure emergently, coronary artery bypass grafting x3.  The following  grafts were placed:  1. Left internal mammary artery to the left anterior descending.  2. Saphenous vein graft to the right coronary artery.  3. Saphenous vein graft to the posterior descending.   [name] tolerated this procedure well and was taken to the surgical  intensive care unit in stable condition.   POSTOPERATIVE HOSPITAL COURSE:  Patient  has, overall, done well.  [name]  initially did require some pressor support with Neo-Synephrine and  dopamine for hypotension.  This has been improved and [name] was weaned  without difficulty from the vent.  [name] has had some postoperative  bronchitis, which required aggressive respiratory therapy and pulmonary  toilet.  This has shown a gradual improvement with time.  Oxygen has  been weaned and [name] maintained good saturations on room air.  The  patient did have some bronchitis component to her pulmonary status and  [name] was treated, as well as with a course of oral Ceftin, which [name] will  finish as an outpatient.  [name] has required a general diuresis, but has  responded well to Lasix.  All routine lines, monitors and drainage  devices has been discontinued in the standard fashion.   LABORATORY DATA:  Reveal a mild postoperative anemia with most recent  hemoglobin and hematocrit, dated November 27, 2006, of 9.9 and 28.1  respectively.  Electrolytes, BUN and creatinine are within normal  limits.  [name] has some clotting parameters being checked with current  results pending.  It is questioned as to whether [name] may be a poor  responder to Plavix.  These labs are investigating and are being  followed by the cardiologist.   The patient has seen the smoking cessation consultant and [name] hopes to  quit.  [name] has been given education and contact information in this  regard for further followup.   Her overall status is felt to be stable for discharge on November 28, 2006.   MEDICATIONS AT DISCHARGE:  Are as follows:  1. Aspirin 81 mg daily.  2. Toprol-XL 25 mg daily.  3. Lipitor 40 mg daily.  4. Ultram  50 mg 1-2 every 6 hours as needed.  5. [name] is to continue her hydromorphone 4 mg 3 times daily as needed      for her severe pain as [name] did previously at home.   OTHER MEDICATIONS:  Include:  1. Ceftin 500 mg twice daily for an additional 7 days.  2. Plavix 75 mg daily.  3. Claritin 10 mg daily as  needed.  4. Paxil 20 mg daily.  5. [name] is also to continue her fish oil supplement that [name] was taking      previously.   FOLLOWUP:  The patient was instructed to follow up with Dr. Riley Kill in 2  weeks.  Dr. Donata Clay on  December 21, 2006 at 11:30 a.m.   FINAL DIAGNOSIS:  Acute ST elevation myocardial infarction, requiring  percutaneous transluminal coronary angioplasty and stenting with  subsequent emergent cardiac surgery as described above for surgical  evaluation.   OTHER DIAGNOSES:  Include:  1. Chronic tobacco abuse.  2. Recent jaw surgery.  3. Depression.  4. Chronic low back pain on multiple narcotics preoperatively.      Rowe Clack, P.A.-C.      Kerin Perna, M.D.  Electronically Signed    WEG/MEDQ  D:  11/28/2006  T:  11/28/2006  Job:  [id number]   cc:   Arturo Morton. Riley Kill, MD, [facility]  Jene Every, M.D.

## 2010-09-13 NOTE — Assessment & Plan Note (Signed)
[facility]                            CARDIOLOGY OFFICE NOTE   NAME:[name], BAKER KOGLER                       MRN:          [id number]  DATE:05/11/2008                            DOB:          [birth year]    [name] is in for a followup visit.  Generally, she is getting along  reasonably well from a cardiac standpoint.  She has really been somewhat  depressed.  Unfortunately, her daughter, who had had a widespread  malignancy recently died.  She would like to go to the gym.  She is back  to smoking about one-fourth packs of cigarettes a day.  In addition, she  has gained much of the weight back.   CURRENT MEDICATIONS:  1. Plavix 75 mg daily.  2. Paxil 20 mg daily.  3. Fish oil 1200 mg daily.  4. Aspirin 81 mg daily.  5. Toprol-XL 25 mg daily.  6. Lipitor 40 mg nightly.   PHYSICAL EXAMINATION:  GENERAL:  She is alert and oriented and in no  distress.  VITAL SIGNS:  Blood pressure is 118/70 and the pulse is 62.  LUNG:  Fields are clear.  CARDIAC:  Rhythm is regular without an S4 gallop.   Electrocardiogram demonstrates normal sinus rhythm.  There are inferior  Qs compatible with inferior infarct of indeterminate age.   IMPRESSION:  1. Coronary artery disease with subsequent abrupt closure in urgent      revascularization surgery.  2. Hypercholesterolemia on lipid lowering therapy.  3. Continued tobacco use.   PLAN:  1. Return to clinic in 6 months.  2. Follow up on the patient's sugar with fasting lipid, liver profile,      and glucose.  3. Counseling regarding tobacco use today.     Arturo Morton. Riley Kill, MD, [facility]  Electronically Signed    TDS/MedQ  DD: 05/11/2008  DT: 05/11/2008  Job #: [id number]

## 2010-09-13 NOTE — Procedures (Signed)
[facility]                              EXERCISE TREADMILL   NAME:[name], [name] PEDRETTI                       MRN:          [id number]  DATE:04/19/2007                            DOB:          06/15/1960    Exercise tolerance test.   Duration of exercise 8 minutes 31 seconds, maximum heart rate 121,  percent of PMHR 69%.   COMMENTS:  [name] exercised today on the Bruce protocol.  Overall  exercise tolerance was relatively good.  She experienced no chest pain.  The test was terminated due to fatigue.  The rest electrocardiogram  demonstrates normal sinus rhythm at maximum stress.  No significant ST  depression was noted.  The study did not reveal significant ischemia at  a good work load, although the heart rate was subdiagnostic due to beta  blockade therapy.   CONCLUSIONS:  Excellent exercise tolerance in a patient who has  undergone revascularization surgery with an emergent procedure with an  internal mammary to the left anterior descending, saphenous vein graft  to the right ventricular marginal, and saphenous vein graft to the  posterior descending.  The patient's lipids are under control.  She is  currently not smoking.  Based on her findings continued medical therapy  will be warranted and we will see her back in followup in 4 months.  At  that time, we may elect to discontinue her Plavix.     Arturo Morton. Riley Kill, MD, [facility]  Electronically Signed    TDS/MedQ  DD: 04/19/2007  DT: 04/20/2007  Job #: [id number]

## 2010-09-13 NOTE — Assessment & Plan Note (Signed)
OFFICE VISIT   [name], [name]  DOB:  10-27-1960                                        January 04, 2007  CHART #:  [id number]   CURRENT PROBLEMS:  1. Emergency CABG x3 11/21/2006 for acute DMI with occlusion of the      right coronary.  2. Tenderness and swelling of a lower sternal incision.   PRESENT ILLNESS:  [name] returns for a wound check. She was placed  on oral Keflex for some mild swelling with a lower sternal incision with  a suture abscess which was removed. She has completed a course of Keflex  and now feels much better. She still has some mild generalized sternal  soreness, but is otherwise progressing with her rehabilitation and has  sent no other significant complaints.   PHYSICAL EXAMINATION:  VITAL SIGNS:  Blood pressure 110/70, pulse 70,  respirations 18, saturation 97%.  CHEST:  Breath sounds are clear. The sternum is stable. There is no more  indurated erythema or signs of an infection.   PLAN:  She will finish her course of antibiotics and continue with her  rehab program. She will return as needed.   [name], M.D.  Electronically Signed   PV/MEDQ  D:  01/04/2007  T:  01/05/2007  Job:  [id number]

## 2010-09-13 NOTE — H&P (Signed)
NAMECHANTALLE, [name]                ACCOUNT NO.:  [id number]   MEDICAL RECORD NO.:  [id number]          PATIENT TYPE:  INP   LOCATION:  1824                         FACILITY:  [facility]   PHYSICIAN:  Bevelyn Buckles. Bensimhon, MDDATE OF BIRTH:  [birth year]   DATE OF ADMISSION:  11/20/2006  DATE OF DISCHARGE:                              HISTORY & PHYSICAL   PRIMARY CARE PHYSICIAN:  Unknown.   ORTHOPEDIST:  Jene Every, M.D.   REASON FOR ADMISSION:  Acute anterior/posterior ST elevation, myocardial  infarction.   HISTORY:  [name] is a 50 year old woman without any history of  previous cardiac disease.  She does have a history of ongoing tobacco  use, depression, and chronic low back pain on multiple narcotics.  She  also has a strong family history of coronary artery disease in both her  mother and her father.  Apparently she underwent jaw surgery yesterday  to replace some missing bone which she says has been depleted due to her  chronic narcotic medications.   Tonight about 9:30 she had the onset of severe chest pain and shortness  of breath while at rest.  EMS was activated.  EKG in the field showed  inferior ST elevation with some minor posterior involvement.  On arrival  to the ER she was pain free with resolution of her ST segment elevation.   REVIEW OF SYSTEMS:  She actually does admit that she had an episode of  chest pain last Saturday.  She denies any palpitations.  No orthopnea,  no PND, no upper or lower extremity edema.  She has not had any melena  or bright red blood per rectum.  She does have chronic back pain and is  pending back surgery.  The remainder of the review of systems is  negative except for HPI and problem list.   PAST MEDICAL HISTORY:  1. Notable for chronic low back pain on multiple narcotics pending      surgery.  2. Recent jaw surgery.  3. Depression.  4. Ongoing tobacco use, half pack a day x20 years.   CURRENT MEDICATIONS:  She does not have her  exact list.  Apparently she  is on Valium, Darvocet, Paxil twice a day, penicillin for her jaw  surgery, and hydromorphone.   ALLERGIES:  She is intolerant of MORPHINE due to nausea.   SOCIAL HISTORY:  She is married.  She has a 104 year old daughter who has  recurrent cancer.  She smokes half a pack of cigarettes per day for the  past 25 years.  She works as an Airline pilot.   FAMILY HISTORY:  Notable for coronary artery disease in both her mother  and her father.   PHYSICAL EXAMINATION:  She is lying flat in bed.  She is obviously  anxious and tearful.  No acute distress otherwise.  Blood pressure is 130/80, heart rate is 63.  Oxygen saturations are in  the high 90s on 2 L nasal cannula.  HEENT:  Normal.  NECK:  Supple.  There is no JVD.  Carotids are 2+ bilaterally and  bruits.  There is no  lymphadenopathy or thyromegaly.  CARDIAC:  PMI is nondisplaced.  She has distant heart sounds.  She has a  regular rate and rhythm.  No murmurs, rubs, or gallops..  LUNGS:  Clear with decreased air movement throughout.  ABDOMEN:  Soft, nontender, nondistended.  There is no  hepatosplenomegaly.  No bruits, no masses.  Good bowel sounds.  EXTREMITIES:  Warm with no cyanosis, clubbing, or edema.  Distal pulses  are 1+ bilaterally.  There are no femoral bruits.  No rashes.  NEUROLOGIC:  She is alert and oriented x3.  Cranial nerves II-XII are  intact.  She moves all four extremities without difficulty.  Affect is  tearful.   LABORATORY DATA:  All labs are pending.   EKG by EMS shows sinus rhythm with 2-3 mm of ST elevation inferiorly  with ST depression in V1 and V2.  Current EKG shows just minimal ST  elevation in lead III and F.   ASSESSMENT:  1. Acute inferior-posterior ST elevation and myocardial infarction.  2. Tobacco use, ongoing.  3. Depression.  4. Chronic back pain with heavy narcotic use.   PLAN:  Plan will be to take her emergently to the cardiac  catheterization lab.  She has  been given four baby aspirin, started on  nitroglycerin, and heparin.      Bevelyn Buckles. Bensimhon, MD  Electronically Signed     DRB/MEDQ  D:  11/20/2006  T:  11/21/2006  Job:  [id number]

## 2010-09-13 NOTE — Assessment & Plan Note (Signed)
OFFICE VISIT   [name], [name]  DOB:  08-15-1960                                        December 21, 2006  CHART #:  [id number]   CURRENT PROBLEMS:  1. Status post emergency CABG times three, 11/21/2006, for acute DMI      with occlusion of right coronary PCI and dissection.  2. History of smoking.  3. Chronic low back pain.  4. Depression.   PRESENT ILLNESS:  [name] is a 50 year old reformed smoker who  returns for her first office visit after undergoing emergency CABG x 3  one month ago when she presented with an acute MI and was treated with  PCI with initial profusion, but was subsequent thrombosis of the stents.  She had two vessel coronary disease and subsequently underwent IMA  grafting of the LAD and vein grafts of the artery marginal and posterior  descending without complication.  She was discharged home on the fifth  postoperative day in sinus rhythm on aspirin, Toprol XL 25 mg, Lipitor  40 mg, Plavix 75 mg, Paxil 20 mg and short course of Lasix and potassium  and Xopenex inhaler.  Since returning home she has done very well.  There has been no recurrent angina.  The surgical incisions are healing  well and she anxious to improve her activity level.   PHYSICAL EXAMINATION:  Blood pressure 103/70, pulse 70 and regular,  respirations 18, saturation 96%.  She is alert and pleasant.  Breath  sounds are clear and equal.  The sternum is stable and well-healed.  Cardiac rhythm is regular without S3, gallop or rub.  Her leg incision  is well-healed and there is no peripheral edema.  Peripheral pulses are  intact.   A PA and lateral chest x-ray taken today shows clear lung fields, no  pleural effusion, with sternal wires well-aligned.   IMPRESSION AND PLAN:  The patient has done well one month out after  emergency CABG.  I told her that she could resume driving and normal  daily activities but to avoid lifting more than 15 to 20 pounds until  three months after surgery.  She was encouraged to start cardiac rehab.  She was encouraged also to remain off cigarettes, which she has been  successful with for the past 30 days.  She will continue her current  medications, including Plavix, under Dr. Rosalyn Charters direction.  I will  see her back as needed.   Kerin Perna, M.D.  Electronically Signed   PV/MEDQ  D:  12/21/2006  T:  12/22/2006  Job:  [id number]   cc:   Arturo Morton. Riley Kill, MD, [facility]

## 2010-09-13 NOTE — Op Note (Signed)
[name], [name]                ACCOUNT NO.:  [id number]   MEDICAL RECORD NO.:  [id number]          PATIENT TYPE:  INP   LOCATION:  2316                         FACILITY:  [facility]   PHYSICIAN:  Bedelia Person, M.D.        DATE OF BIRTH:  1961-05-01   DATE OF PROCEDURE:  11/21/2006  DATE OF DISCHARGE:                               OPERATIVE REPORT   PROCEDURE:  Intraoperative transesophageal echocardiography.   The patient is emergent coronary artery bypass grafting having suffered  an acute myocardial infarction. She has no history of esophageal or  gastric medical conditions.  TEE will be used to intraoperatively to  assess left ventricular function as well as valvular function.   After induction of general anesthesia, the airway was secured with an  oral endotracheal tube.  The gastric contents were suctioned with an  orogastric tube which was hen removed.  The transesophageal probe was  heavily lubricated and placed in a sleeve which was also heavily  lubricated and placed blindly down the oropharynx to the 40-45 cm mark.  Throughout the case, it remained in that position and in the neutral  unflexed position during bypass.  At the completion of the case, the  probe was removed.  There was no evidence of oral or pharyngeal damage.  The prebypass examination revealed the left ventricle to be normal size.  There was severe hypokinesis of the inferior wall.  No other segmental  defects were detected.  The left atrium was normal in size.  The  appendage was clean.  The septum was intact.  The mitral valve had two  leaflets that opened and closed appropriately.  There was no evidence of  calcification or other masses.  The color Doppler revealed a trace  central mitral insufficiency flow.  The aortic valve had three leaflets.  They all opened and closed appropriately.  There was no evidence of  calcification.  Color Doppler revealed no aortic insufficiency.  The  right heart exam showed  tricuspid valve to be normal in appearance and  function.  Color Doppler did reveal mild tricuspid insufficiency with  the Swan-Ganz catheter across the valve.  The patient underwent coronary  artery bypass grafting under cardiopulmonary bypass with completion of  the bypass.  Low dose inotropic support with dopamine had been  instituted.  The postbypass examination  showed the left ventricle to  now be dynamic in function.  There continued to remain severe  hypokinesis to akinesis of the inferior wall.  No other new segmental  defects were detected, and as noted earlier, the remainder of the left  ventricle was dynamic in function.  Mitral valve again exhibited just  trace insufficiency flow in the central pattern.  Aortic valve was again  normal and there were no other significant changes on the examination.           ______________________________  Bedelia Person, M.D.     LK/MEDQ  D:  11/22/2006  T:  11/22/2006  Job:  [id number]

## 2010-09-13 NOTE — Assessment & Plan Note (Signed)
OFFICE VISIT   [name], [name]  DOB:  12-18-1960                                        December 28, 2006  CHART #:  [id number]   [name] returns for a wound check.  She is complaining of some  tightness and swelling over the lower sternal incision.   PHYSICAL EXAMINATION:  On exam today, she has a temperature of 100.2.  She has a small suture abscess at the lower sternal incision.  The  suture is removed and there is no deep sinus tract.  She has some  indurated scar tissue at the lower pole of the incision.  The sternum  itself is stable.  Breath sounds are clear and the heart rate is  regular.  Her blood pressure is 100/70.   PLAN:  The patient will be given a course of oral Keflex 500 t.i.d. and  return for a wound check in one week.   Kerin Perna, M.D.  Electronically Signed   PV/MEDQ  D:  12/28/2006  T:  12/29/2006  Job:  [id number]

## 2010-09-13 NOTE — Assessment & Plan Note (Signed)
[facility]                            CARDIOLOGY OFFICE NOTE   NAME:[name], [name] HORNBECK                       MRN:          [id number]  DATE:08/02/2007                            DOB:          1960-09-14    [name] is in for follow-up.  She has been stable.  She has not been  having any ongoing chest pain.  She has unfortunately started to smoke  again and is smoking three cigarettes a day.  She has also gained quite  a bit of weight much related to the stress of her daughter.  Her  daughter is undergoing chemotherapy, and has had significant progression  in her sarcoma.  The patient denies any current symptoms.   MEDICATIONS:  1. Plavix 75 mg daily.  2. Paxil 20 mg daily.  3. Fish oil 1200 mg daily.  4. Aspirin 81 mg daily.  5. Toprol XL 25 mg daily.  6. Lipitor 40 mg q.h.s.   PHYSICAL EXAMINATION:  GENERAL:  She is alert and oriented,  nondistressed.  VITAL SIGNS:  Blood pressure is 114/76, pulse 61.  LUNGS:  Lung fields are clear.  NECK:  Jugular veins are not distended.  There are no carotid bruits.  CHEST:  The sternotomy is well-healed.  CARDIAC:  Cardiac rhythm is regular.   Electrocardiogram demonstrates normal sinus rhythm.  There is evidence  of inferior Q waves.  Otherwise, it is unremarkable.  There are Q's in  V5 and V6.   IMPRESSION:  1. Coronary artery disease with acute stent thrombosis leading to      reopening and then subsequent urgent revascularization surgery.  2. Hypercholesterolemia on lipid lowering therapy.  3. Smoker.  4. Orthopedic issues.   PLAN:  1. Continue current medical regimen.  2. Return to clinic in six months.  3. Encourage her to see her primary, and she should have her sugar      rechecked at some point.    Arturo Morton. Riley Kill, MD, [facility]  Electronically Signed   TDS/MedQ  DD: 08/02/2007  DT: 08/02/2007  Job #: [id number]

## 2010-09-13 NOTE — Op Note (Signed)
[name], [name]                ACCOUNT NO.:  [id number]   MEDICAL RECORD NO.:  [id number]          PATIENT TYPE:  AMB   LOCATION:  DAY                          FACILITY:  [facility]   PHYSICIAN:  Jene Every, M.D.    DATE OF BIRTH:  [birth year]   DATE OF PROCEDURE:  05/01/2007  DATE OF DISCHARGE:                               OPERATIVE REPORT   PREOPERATIVE DIAGNOSIS:  Medial meniscus tear left knee.   POSTOPERATIVE DIAGNOSIS:  Medial meniscus tear left knee, grade 3  chondromalacia medial femoral condyle, grade 3 chondromalacia patella.   PROCEDURE PERFORMED:  Left knee arthroscopy, partial medial  meniscectomy, chondroplasty medial femoral condyle, chondroplasty of  patella.   ANESTHESIA:  General.   ASSISTANT:  None.   BRIEF HISTORY AND INDICATION:  A 50 year old with refractory knee pain,  MRI indicating possible meniscus tear, patellofemoral pain as well.  Operative intervention indicated for diagnosis and treatment.  Risks and  benefits discussed including bleeding, infection, damage to  neurovascular structures, no change in symptoms, worsening symptoms,  need for repeat debridement in the future, etc.   DESCRIPTION OF PROCEDURE:  The patient in supine position, after  induction of adequate general anesthesia, 1 gram of Kefzol, left lower  extremity was prepped and draped in the usual sterile fashion.  A  lateral parapatellar portal and superomedial parapatellar portal was  fashioned with a #11 blade.  Ingress cannula atraumatically placed.  Irrigant was utilized to insufflate the joint.  Under direct  visualization, medial parapatellar portal was fashioned with a #11 blade  after localization with an 18 gauge needle sparing the medial meniscus.  No evidence of significant grade 3 changes of the femoral condyle  weightbearing service.  A 4.2 Cuda was introduced and utilized to  perform chondroplasty of loose cartilaginous debris.  This was further  contoured with a  ArthroWand.  There was a small tear in the anterior  horn of the medial meniscus.  This was shaved with a 3.2 Cuda shaver as  well as a ArthroWand.  The remnants of the medial meniscus was stable to  probe palpation.  ACL and PCL was unremarkable.  Lateral compartment  revealed normal femoral condyle.  Tibial plateau and meniscus stable to  probe palpation without evidence of tearing.   The patellofemoral joint, however, indicated significant degenerative  grade 3 changes of patella.  This was shaved with a 4.2 Cuda shaver and  then further contoured with an ArthroWand.  There was normal  patellofemoral tracking.  Gutters were unremarkable.  Sulcus was  unremarkable as well.  The knee was copiously lavaged, all compartments  were reexamined.  There was no other loose cartilaginous debris or  residual meniscus tear amenable to arthroscopic intervention.  Full  flexion extension without incarceration of the meniscus.   All instrumentation was then removed.  Portals were closed with 4-0  nylon simple suture, 25% Marcaine with epinephrine was infiltrated in  the joint.  Wound was dressed sterilely.  She was awoken without  difficulty and transported to the recovery room in satisfactory  addition.   The patient had  no complication.   COMPLICATIONS:  None.      Jene Every, M.D.  Electronically Signed     JB/MEDQ  D:  05/01/2007  T:  05/01/2007  Job:  [id number]

## 2010-09-13 NOTE — Cardiovascular Report (Signed)
NAMEDEEM, MARMOL                ACCOUNT NO.:  [id number]   MEDICAL RECORD NO.:  [id number]          PATIENT TYPE:  INP   LOCATION:  2316                         FACILITY:  [facility]   PHYSICIAN:  Arturo Morton. Riley Kill, MD, FACCDATE OF BIRTH:  1960/11/08   DATE OF PROCEDURE:  11/21/2006  DATE OF DISCHARGE:                            CARDIAC CATHETERIZATION   INDICATIONS:  Ms. Hyacinth Meeker presented late last evening with an acute  inferior wall myocardial infarction.  She underwent emergency cardiac  catheterization which demonstrated a high-grade stenosis in the mid  right coronary.  Reperfusion was successfully reestablished with some  resolution of chest pain.  The infarct lesion was then subsequently  dilated.  She had a fairly significant dissection and we were unsure as  to whether this represented a retrograde and antegrade dissection from  the lesion site as it occurred just after the 2.5-mm balloon  predilatation, or whether it could represent a more proximal guide  dissection.  However, just after balloon dilatation at the infarct site,  the true lumen was clearly mostly reestablished.  The lesion was then  stented successfully and we placed a second stent proximally up to the  ostium.  With this, there was an excellent angiographic result.  We had  Dr. Donata Clay come to the catheterization laboratory.  The  anticoagulation strategy of the HORIZONS trial was used.  We considered  using glycoprotein inhibitors following the procedure, but because of  the adventitial staining along the aortic root we elected not to use  eptifibatide or ReoPro.  The patient did well and went to the CCU in  satisfactory clinical condition.  She then developed recurrent chest  pain with ST-elevation and was brought back emergently to the cardiac  catheterization for further evaluation.Marland Kitchen   PROCEDURE:  Percutaneous angioplasty of the proximal and distal right  coronary artery.   DESCRIPTION OF THE  PROCEDURE:  The patient was brought back to the  catheterization laboratory from the CCU.  The 6-French sheath was re-  exchanged for a 6-French sheath, and intravenous heparin was  administered.  ACTs were checked.  We then administered glycoprotein  inhibitors, and a repeat ACT was checked.  The vessel was noted to be  occluded and the adventitial staining was no longer present.  The  occlusion was approximately 10-15 mm inside the vessel.  We subsequently  were able to get wires down both the acute marginal branch and into the  distal vessel.  A dilatation was done in the distal vessel beyond the  acute marginal takeoff, and also more proximally.  The more proximal  area where the site of total occlusion was was dilated with a 4-mm  balloon.  Much of this in the proximal and mid stent appeared to be  clot, but more distally had the appearance of continued dissection  distally, which as previously noted was noted just after the 2.5-mm  balloon dilatation at the acute infarct site.  With wires down both  vessels and re-establishment of flow, the patient's ST elevation  basically resolved and she was chest-pain free.  I brought  Dr. Donata Clay  to the catheterization laboratory and he and I discussed the case  extensively.  Given all the circumstances, there appeared to be good  target sites in the posterolateral and inferolateral segment.  Also, the  initial sets of enzymes were not markedly increased, and the reperfusion  times had been short in both instances.  The patient did appear to be  perfectly stable at this point in time.  Given all the circumstances, it  was felt that long-term flow could be best established in the acute  marginal and distal right coronary artery by revascularization surgery.  In addition, the patient does have evidence of moderate plaquing of  other coronary vessels as well.  We felt there was still significant  viable myocardium in the inferior territory and it  was felt that  revascularization surgery would give her her best option at long-term  result.  With this, we did have good flow and we elected to remove all  the catheters.  She had a small hematoma in the right groin and the 6-  French sheath was upgraded to a 7-French sheath.  Eptifibatide was  discontinued.  Plans were made to take her to the operating room for  revascularization surgery.  This was explained to the family as well as  the patient.   ANGIOGRAPHIC DATA:  At the completion of the procedure last night, the  vessel was widely patent.  Following representation this morning, the  vessel was totally occluded approximately 10-15 mm within the stent.  After re-establishment distally, there was evidence of what appeared to  be continued dissection probably extending into the continuation portion  of the right coronary artery as previously noted.  Likewise, there  appeared to be some probably into the acute marginal, but both distal  targets appeared to be graftable according to Dr. Donata Clay.   CONCLUSIONS:  1. Acute myocardial infarction treated with primary stenting with      subsequent dissection and adjunct stenting.  2. Acute reocclusion, probably related to both thrombosis as well as      continued distal dissection, with successful re-establishment of      flow.   DISPOSITION:  The urgent revascularization surgery has been recommended.  The patient left the laboratory in very stable condition, and with  resolution of chest pain and ST elevation.      Arturo Morton. Riley Kill, MD, [facility]  Electronically Signed     TDS/MEDQ  D:  11/21/2006  T:  11/21/2006  Job:  [id number]

## 2010-09-15 ENCOUNTER — Encounter (INDEPENDENT_AMBULATORY_CARE_PROVIDER_SITE_OTHER): Payer: Self-pay

## 2010-09-15 ENCOUNTER — Ambulatory Visit (INDEPENDENT_AMBULATORY_CARE_PROVIDER_SITE_OTHER): Payer: Self-pay | Admitting: Vascular Surgery

## 2010-09-15 DIAGNOSIS — M79609 Pain in unspecified limb: Secondary | ICD-10-CM

## 2010-09-15 DIAGNOSIS — I8000 Phlebitis and thrombophlebitis of superficial vessels of unspecified lower extremity: Secondary | ICD-10-CM

## 2010-09-16 NOTE — Assessment & Plan Note (Signed)
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  She was last seen on April 26.  At that time she had been placed on Coumadin for superficial thrombosis of veins as well as a thrombophlebitis in her right leg.  She was placed on nonsteroidals at that time.  She returns today for further followup.  She states that the pain has essentially resolved in her right leg.  She has noticed some more prominent varicosities in the right leg.  PHYSICAL EXAM:  Blood pressure is 129/77 in the left arm, heart rate is 66 and regular.  Temperature is 97.8.  Right lower extremity has multiple varicosities but this is much improved from her previous visit and is now essentially nontender to touch.  She had a venous duplex ultrasound today which showed a thrombosed varicose vein but no reflux in the deep or superficial venous system on the right side.  The patient's symptoms of thrombophlebitis are now resolving.  I believe she can stop her nonsteroidal anti-inflammatories at this point.  I believe also she has probably had sufficient Coumadin therapy at this point since she has been on Coumadin for almost a month.  Her risk of propagating to a DVT from this at this point should be fairly low.  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  Most likely these can usually be treated with nonsteroidal anti-inflammatories.  We also did prescribe for her today compression garments for her right lower extremity and I gave her Ace wraps today to use until all the pain is completely resolved before using a  compression stocking.  She will follow up with [place] on an as- needed basis.    Janetta Hora. Fields, MD Electronically Signed  CEF/MEDQ  D:  09/15/2010  T:  09/16/2010  Job:  [id number]  cc:   Norberto Sorenson, MD

## 2010-09-24 NOTE — Procedures (Unsigned)
DUPLEX DEEP VENOUS EXAM - LOWER EXTREMITY  INDICATION:  Pain/swelling.  HISTORY:  Edema:  Chronic right lower extremity swelling. Trauma/Surgery:  No. Pain:  Right medial calf tenderness. PE:  No. Previous DVT:  No, history of thrombosis of right calf varicosities. Anticoagulants:  Coumadin.  DUPLEX EXAM:               CFV   SFV   PopV  PTV    GSV               R  L  R  L  R  L  R   L  R  L Thrombosis    o  o  o     o     o      o Spontaneous   +  +  +     +     +      + Phasic        +  +  +     +     +      + Augmentation  +  +  +     +     +      + Compressible  +  +  +     +     +      + Competent     +  +  +     +     +      +  Legend:  + - yes  o - no  p - partial  D - decreased   IMPRESSION: 1. No evidence of deep venous thrombosis noted in the right lower     extremity. 2. 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  No evidence of thrombus noted in the right     great or small saphenous veins. 3. No clinically significant reflux of >500 milliseconds noted in the     right lower extremity venous system.         _____________________________ Janetta Hora Fields, MD  CH/MEDQ  D:  09/16/2010  T:  09/16/2010  Job:  [id number]

## 2011-01-24 ENCOUNTER — Telehealth: Payer: Self-pay | Admitting: *Deleted

## 2011-02-01 ENCOUNTER — Ambulatory Visit (INDEPENDENT_AMBULATORY_CARE_PROVIDER_SITE_OTHER): Payer: Self-pay | Admitting: Cardiothoracic Surgery

## 2011-02-01 ENCOUNTER — Ambulatory Visit
Admission: RE | Admit: 2011-02-01 | Discharge: 2011-02-01 | Disposition: A | Discharge status: Home / Self Care | Payer: Self-pay | Source: Ambulatory Visit | Attending: Cardiothoracic Surgery | Admitting: Cardiothoracic Surgery

## 2011-02-01 ENCOUNTER — Other Ambulatory Visit: Payer: Self-pay | Admitting: Cardiothoracic Surgery

## 2011-02-01 ENCOUNTER — Encounter: Payer: Self-pay | Admitting: Cardiothoracic Surgery

## 2011-02-01 ENCOUNTER — Telehealth: Payer: Self-pay

## 2011-02-01 VITALS — BP 121/76 | HR 71 | Resp 16 | Ht 62.0 in | Wt 154.0 lb

## 2011-02-01 DIAGNOSIS — I2510 Atherosclerotic heart disease of native coronary artery without angina pectoris: Secondary | ICD-10-CM

## 2011-02-01 DIAGNOSIS — G8929 Other chronic pain: Secondary | ICD-10-CM | POA: Insufficient documentation

## 2011-02-01 DIAGNOSIS — K432 Incisional hernia without obstruction or gangrene: Secondary | ICD-10-CM

## 2011-02-01 NOTE — Patient Instructions (Signed)
Stop smoking and lose 10 lbs. Return if the small incisional hernia is more uncomfortable.

## 2011-02-01 NOTE — Progress Notes (Signed)
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 She is felt a small knot or popping sensation at the lower left and of the a sternal incision. The sternal incision healed well. The chest x-ray shows no fractured or displaced sternal wires. The patient has gained considerable weight since her surgery due to a depression over the death of her daughter. She has been followed by Dr. Tedra [place] for coronary disease since surgery and has no evidence recurrent angina or new cardiac issues.  Current Outpatient Prescriptions  Medication Sig Dispense Refill  . aspirin 81 MG tablet Take 81 mg by mouth daily.        Marland Kitchen atorvastatin (LIPITOR) 40 MG tablet Take 40 mg by mouth daily.        . DULoxetine (CYMBALTA) 60 MG capsule Take 60 mg by mouth daily.        . fish oil-omega-3 fatty acids 1000 MG capsule Take 2 g by mouth daily.        Marland Kitchen gabapentin (NEURONTIN) 300 MG capsule Take 300 mg by mouth 2 (two) times daily. Takes 2 in the am and 4 in the pm       . HYDROcodone-acetaminophen (VICODIN) 5-500 MG per tablet Take 1 tablet by mouth every 6 (six) hours as needed.        . Magnesium 250 MG TABS Take by mouth 1 day or 1 dose.        . metoprolol succinate (TOPROL-XL) 25 MG 24 hr tablet Take 25 mg by mouth daily.        . traMADol (ULTRAM) 50 MG tablet Take 40 mg by mouth 3 (three) times daily. Takes 2 tabs in the am, one tab at noon, and one tab in the pm          Review of Systems: No fever no night sweats she has lost 8 pounds on a diet. No difficulty with the saphenous vein harvest from the left leg.  Physical Exam Blood pressure 120/78 pulse 70 and regular saturation 95% on room air she is afebrile. General appearance is a young Caucasian female no acute distress. Breath sounds are clear and equal. The sternum is stable without click or movement. There is no sign of cellulitis. At the distal aspect of the sternal incision there is a  less than a fingertip of fascial defect of the left with some fat the is easily reduced. Cardiac rhythm is regular no murmur or gallop.     Diagnostic Tests: PA and lateral chest x-ray shows intact sternal wires normal cardiac silhouette no pleural effusion normal cardiac size.    Impression: Small incisional hernia at the distal aspect of the sternal incision. This should not cause significant symptoms or represent a risk for incarceration.    Plan: She'll return here if the small defect gets worse more symptomatic or she wishes reexam.

## 2011-02-03 LAB — COMPREHENSIVE METABOLIC PANEL
Albumin: 3.5
BUN: 11
Creatinine, Ser: 0.67
GFR calc Af Amer: >60
Total Protein: 6.5

## 2011-02-03 LAB — CBC
HCT: 41.9
MCV: 85
Platelets: 242
RDW: 15.5

## 2011-02-13 LAB — CBC
HCT: 22.2 — ABNORMAL LOW
HCT: 25.4 — ABNORMAL LOW
HCT: 26.3 — ABNORMAL LOW
HCT: 28 — ABNORMAL LOW
HCT: 28.1 — ABNORMAL LOW
HCT: 28.7 — ABNORMAL LOW
HCT: 29.9 — ABNORMAL LOW
HCT: 30.8 — ABNORMAL LOW
HCT: 31.3 — ABNORMAL LOW
HCT: 41.2
HCT: 43.6
Hemoglobin: 10.1 — ABNORMAL LOW
Hemoglobin: 10.6 — ABNORMAL LOW
Hemoglobin: 10.9 — ABNORMAL LOW
Hemoglobin: 14.1
Hemoglobin: 15.1 — ABNORMAL HIGH
Hemoglobin: 7.7 — CL
Hemoglobin: 8.6 — ABNORMAL LOW
Hemoglobin: 9.1 — ABNORMAL LOW
Hemoglobin: 9.7 — ABNORMAL LOW
Hemoglobin: 9.7 — ABNORMAL LOW
Hemoglobin: 9.9 — ABNORMAL LOW
MCHC: 33.6
MCHC: 33.8
MCHC: 33.9
MCHC: 34.2
MCHC: 34.5
MCHC: 34.7
MCHC: 34.7
MCHC: 34.8
MCHC: 34.9
MCHC: 35.1
MCV: 86.1
MCV: 86.5
MCV: 86.7
MCV: 87
MCV: 87
MCV: 87.1
MCV: 88.2
MCV: 88.4
MCV: 88.5
MCV: 88.7
Platelets: 131 — ABNORMAL LOW
Platelets: 133 — ABNORMAL LOW
Platelets: 152
Platelets: 172
Platelets: 182
Platelets: 189
Platelets: 241
Platelets: 299
Platelets: 341
RBC: 2.55 — ABNORMAL LOW
RBC: 2.88 — ABNORMAL LOW
RBC: 3.02 — ABNORMAL LOW
RBC: 3.23 — ABNORMAL LOW
RBC: 3.24 — ABNORMAL LOW
RBC: 3.24 — ABNORMAL LOW
RBC: 3.38 — ABNORMAL LOW
RBC: 3.49 — ABNORMAL LOW
RBC: 3.6 — ABNORMAL LOW
RBC: 4.72
RBC: 5.07
RDW: 12.9
RDW: 13.1
RDW: 13.2
RDW: 13.2
RDW: 13.2
RDW: 13.4
RDW: 13.4
RDW: 13.6
RDW: 13.6
WBC: 10.6 — ABNORMAL HIGH
WBC: 11.2 — ABNORMAL HIGH
WBC: 12.4 — ABNORMAL HIGH
WBC: 12.6 — ABNORMAL HIGH
WBC: 12.7 — ABNORMAL HIGH
WBC: 12.9 — ABNORMAL HIGH
WBC: 13 — ABNORMAL HIGH
WBC: 7.6
WBC: 8.2
WBC: 8.3

## 2011-02-13 LAB — I-STAT EC8
Acid-Base Excess: 1
BUN: 5 — ABNORMAL LOW
BUN: 5 — ABNORMAL LOW
Bicarbonate: 24.3 — ABNORMAL HIGH
Bicarbonate: 27.1 — ABNORMAL HIGH
Chloride: 102
Glucose, Bld: 118 — ABNORMAL HIGH
HCT: 26 — ABNORMAL LOW
HCT: 31 — ABNORMAL LOW
Hemoglobin: 10.5 — ABNORMAL LOW
Hemoglobin: 8.8 — ABNORMAL LOW
Operator id: 241191
Operator id: 284241
Potassium: 3.3 — ABNORMAL LOW
Potassium: 3.5
Sodium: 140
Sodium: 143
TCO2: 21
TCO2: 26
pCO2 arterial: 22.1 — ABNORMAL LOW
pCO2 arterial: 47.1 — ABNORMAL HIGH
pH, Arterial: 7.576 — ABNORMAL HIGH

## 2011-02-13 LAB — PROTIME-INR
INR: 0.9
INR: 1.1
INR: 1.2
Prothrombin Time: 14
Prothrombin Time: 15.1
Prothrombin Time: 33.2 — ABNORMAL HIGH

## 2011-02-13 LAB — COMPREHENSIVE METABOLIC PANEL
ALT: 10
Alkaline Phosphatase: 84
Alkaline Phosphatase: 86
BUN: 10
CO2: 23
Chloride: 105
Chloride: 99
Glucose, Bld: 128 — ABNORMAL HIGH
Glucose, Bld: 129 — ABNORMAL HIGH
Potassium: 3.7
Potassium: 4
Sodium: 138
Total Bilirubin: 0.7
Total Protein: 6.5

## 2011-02-13 LAB — CREATININE, SERUM
Creatinine, Ser: 0.58
Creatinine, Ser: 0.66
GFR calc Af Amer: >60
GFR calc Af Amer: >60
GFR calc non Af Amer: >60
GFR calc non Af Amer: >60

## 2011-02-13 LAB — CARDIAC PANEL(CRET KIN+CKTOT+MB+TROPI)
CK, MB: 213 — ABNORMAL HIGH
CK, MB: 5.3 — ABNORMAL HIGH
Relative Index: 11.4 — ABNORMAL HIGH
Total CK: 1870 — ABNORMAL HIGH
Troponin I: 31.35

## 2011-02-13 LAB — BASIC METABOLIC PANEL
BUN: 11
BUN: 12
BUN: 3 — ABNORMAL LOW
BUN: 7
BUN: 9
BUN: 9
CO2: 24
CO2: 27
CO2: 29
CO2: 30
CO2: 31
CO2: 32
Calcium: 8 — ABNORMAL LOW
Calcium: 8.2 — ABNORMAL LOW
Calcium: 8.5
Calcium: 8.6
Calcium: 8.6
Calcium: 8.8
Chloride: 100
Chloride: 101
Chloride: 103
Chloride: 104
Chloride: 108
Chloride: 99
Creatinine, Ser: 0.56
Creatinine, Ser: 0.59
Creatinine, Ser: 0.66
Creatinine, Ser: 0.67
Creatinine, Ser: 0.69
Creatinine, Ser: 0.71
GFR calc Af Amer: >60
GFR calc Af Amer: >60
GFR calc Af Amer: >60
GFR calc Af Amer: >60
GFR calc Af Amer: >60
GFR calc Af Amer: >60
GFR calc non Af Amer: >60
GFR calc non Af Amer: >60
GFR calc non Af Amer: >60
GFR calc non Af Amer: >60
GFR calc non Af Amer: >60
GFR calc non Af Amer: >60
Glucose, Bld: 103 — ABNORMAL HIGH
Glucose, Bld: 107 — ABNORMAL HIGH
Glucose, Bld: 107 — ABNORMAL HIGH
Glucose, Bld: 115 — ABNORMAL HIGH
Glucose, Bld: 118 — ABNORMAL HIGH
Glucose, Bld: 97
Potassium: 3.3 — ABNORMAL LOW
Potassium: 3.5
Potassium: 3.6
Potassium: 3.7
Potassium: 3.7
Potassium: 3.8
Sodium: 135
Sodium: 136
Sodium: 137
Sodium: 137
Sodium: 139
Sodium: 141

## 2011-02-13 LAB — POCT CARDIAC MARKERS
CKMB, poc: <1 — ABNORMAL LOW
Myoglobin, poc: 61

## 2011-02-13 LAB — POCT I-STAT 4, (NA,K, GLUC, HGB,HCT)
Glucose, Bld: 119 — ABNORMAL HIGH
Glucose, Bld: 99
HCT: 23 — ABNORMAL LOW
HCT: 23 — ABNORMAL LOW
HCT: 35 — ABNORMAL LOW
Hemoglobin: 11.9 — ABNORMAL LOW
Hemoglobin: 7.5 — CL
Hemoglobin: 7.8 — CL
Hemoglobin: 7.8 — CL
Operator id: 3342
Potassium: 3.2 — ABNORMAL LOW
Potassium: 4.3
Potassium: 4.6
Sodium: 137
Sodium: 141

## 2011-02-13 LAB — POCT I-STAT 3, ART BLOOD GAS (G3+)
Acid-base deficit: 5 — ABNORMAL HIGH
Bicarbonate: 19.2 — ABNORMAL LOW
Bicarbonate: 24.8 — ABNORMAL HIGH
O2 Saturation: 100
O2 Saturation: 94
O2 Saturation: 96
Operator id: 241191
Operator id: 241191
Patient temperature: 36.3
Patient temperature: 37.9
pCO2 arterial: 36.5
pCO2 arterial: 39.9
pH, Arterial: 7.355
pO2, Arterial: 307 — ABNORMAL HIGH
pO2, Arterial: 55 — ABNORMAL LOW

## 2011-02-13 LAB — PREPARE FRESH FROZEN PLASMA

## 2011-02-13 LAB — I-STAT 8, (EC8 V) (CONVERTED LAB)
Acid-Base Excess: 6 — ABNORMAL HIGH
Acid-Base Excess: 9 — ABNORMAL HIGH
Chloride: 100
Chloride: 98
Glucose, Bld: 138 — ABNORMAL HIGH
HCT: 36
Hemoglobin: 10.5 — ABNORMAL LOW
Hemoglobin: 12.2
Operator id: 193041
Potassium: 3.3 — ABNORMAL LOW
Potassium: 3.8
Sodium: 137
TCO2: 31

## 2011-02-13 LAB — LIPID PANEL
LDL Cholesterol: 170 — ABNORMAL HIGH
Triglycerides: 88

## 2011-02-13 LAB — DIFFERENTIAL
Basophils Absolute: 0
Basophils Relative: 0
Basophils Relative: 2 — ABNORMAL HIGH
Eosinophils Absolute: 0.1
Eosinophils Absolute: 0.2
Monocytes Absolute: 0.5
Monocytes Relative: 7
Neutro Abs: 10.4 — ABNORMAL HIGH
Neutrophils Relative %: 50
Neutrophils Relative %: 82 — ABNORMAL HIGH

## 2011-02-13 LAB — POCT I-STAT GLUCOSE
Glucose, Bld: 161 — ABNORMAL HIGH
Operator id: 238531
Operator id: 3342

## 2011-02-13 LAB — CK TOTAL AND CKMB (NOT AT ARMC)
CK, MB: 125.5 — ABNORMAL HIGH
Relative Index: 7.6 — ABNORMAL HIGH
Total CK: 1644 — ABNORMAL HIGH

## 2011-02-13 LAB — FACTOR 5 LEIDEN

## 2011-02-13 LAB — CROSSMATCH
ABO/RH(D): O POS
Antibody Screen: NEGATIVE

## 2011-02-13 LAB — BLOOD GAS, ARTERIAL
Acid-base deficit: 3.6 — ABNORMAL HIGH
Bicarbonate: 21.5
O2 Saturation: 93.2
Patient temperature: 98.6
TCO2: 22.8
pO2, Arterial: 71 — ABNORMAL LOW

## 2011-02-13 LAB — PROTHROMBIN GENE MUTATION

## 2011-02-13 LAB — APTT
aPTT: 32
aPTT: >200

## 2011-02-13 LAB — MAGNESIUM
Magnesium: 2.1
Magnesium: 2.2
Magnesium: 2.7 — ABNORMAL HIGH

## 2011-02-13 LAB — POCT I-STAT CREATININE
Creatinine, Ser: 0.8
Operator id: 274862

## 2011-02-13 LAB — PREPARE PLATELET PHERESIS

## 2011-02-13 LAB — POCT I-STAT 3, VENOUS BLOOD GAS (G3P V)
O2 Saturation: 73
TCO2: 21
pH, Ven: 7.296

## 2011-02-28 ENCOUNTER — Other Ambulatory Visit: Payer: Self-pay | Admitting: Family Medicine

## 2011-02-28 ENCOUNTER — Ambulatory Visit (HOSPITAL_COMMUNITY)
Admission: RE | Admit: 2011-02-28 | Discharge: 2011-02-28 | Disposition: A | Discharge status: Home / Self Care | Payer: Self-pay | Source: Ambulatory Visit | Attending: Family Medicine | Admitting: Family Medicine

## 2011-02-28 DIAGNOSIS — Z01818 Encounter for other preprocedural examination: Secondary | ICD-10-CM | POA: Insufficient documentation

## 2011-02-28 DIAGNOSIS — M25532 Pain in left wrist: Secondary | ICD-10-CM

## 2011-03-30 ENCOUNTER — Emergency Department (HOSPITAL_COMMUNITY): Payer: Self-pay

## 2011-03-30 ENCOUNTER — Emergency Department (HOSPITAL_COMMUNITY)
Admission: EM | Admit: 2011-03-30 | Discharge: 2011-03-31 | Disposition: A | Discharge status: Home / Self Care | Payer: Self-pay | Attending: Emergency Medicine | Admitting: Emergency Medicine

## 2011-03-30 ENCOUNTER — Encounter (HOSPITAL_COMMUNITY): Payer: Self-pay | Admitting: Emergency Medicine

## 2011-03-30 DIAGNOSIS — R0602 Shortness of breath: Secondary | ICD-10-CM | POA: Insufficient documentation

## 2011-03-30 DIAGNOSIS — R05 Cough: Secondary | ICD-10-CM

## 2011-03-30 DIAGNOSIS — E119 Type 2 diabetes mellitus without complications: Secondary | ICD-10-CM | POA: Insufficient documentation

## 2011-03-30 DIAGNOSIS — R059 Cough, unspecified: Secondary | ICD-10-CM | POA: Insufficient documentation

## 2011-03-30 DIAGNOSIS — R5381 Other malaise: Secondary | ICD-10-CM | POA: Insufficient documentation

## 2011-03-30 DIAGNOSIS — I252 Old myocardial infarction: Secondary | ICD-10-CM | POA: Insufficient documentation

## 2011-03-30 DIAGNOSIS — R0789 Other chest pain: Secondary | ICD-10-CM | POA: Insufficient documentation

## 2011-03-30 DIAGNOSIS — R002 Palpitations: Secondary | ICD-10-CM | POA: Insufficient documentation

## 2011-03-30 DIAGNOSIS — I2510 Atherosclerotic heart disease of native coronary artery without angina pectoris: Secondary | ICD-10-CM | POA: Insufficient documentation

## 2011-03-30 DIAGNOSIS — E7800 Pure hypercholesterolemia, unspecified: Secondary | ICD-10-CM | POA: Insufficient documentation

## 2011-03-30 DIAGNOSIS — M549 Dorsalgia, unspecified: Secondary | ICD-10-CM | POA: Insufficient documentation

## 2011-03-30 DIAGNOSIS — Z79899 Other long term (current) drug therapy: Secondary | ICD-10-CM | POA: Insufficient documentation

## 2011-03-30 DIAGNOSIS — I10 Essential (primary) hypertension: Secondary | ICD-10-CM | POA: Insufficient documentation

## 2011-03-30 DIAGNOSIS — R269 Unspecified abnormalities of gait and mobility: Secondary | ICD-10-CM | POA: Insufficient documentation

## 2011-03-30 DIAGNOSIS — F329 Major depressive disorder, single episode, unspecified: Secondary | ICD-10-CM | POA: Insufficient documentation

## 2011-03-30 DIAGNOSIS — Z9889 Other specified postprocedural states: Secondary | ICD-10-CM | POA: Insufficient documentation

## 2011-03-30 DIAGNOSIS — Z7982 Long term (current) use of aspirin: Secondary | ICD-10-CM | POA: Insufficient documentation

## 2011-03-30 DIAGNOSIS — G8929 Other chronic pain: Secondary | ICD-10-CM | POA: Insufficient documentation

## 2011-03-30 DIAGNOSIS — F3289 Other specified depressive episodes: Secondary | ICD-10-CM | POA: Insufficient documentation

## 2011-03-30 DIAGNOSIS — R5383 Other fatigue: Secondary | ICD-10-CM | POA: Insufficient documentation

## 2011-03-30 HISTORY — DX: Essential (primary) hypertension: I10

## 2011-03-30 HISTORY — DX: Pure hypercholesterolemia, unspecified: E78.00

## 2011-03-30 HISTORY — DX: Atherosclerotic heart disease of native coronary artery without angina pectoris: I25.10

## 2011-03-30 LAB — BASIC METABOLIC PANEL
CO2: 21 mEq/L (ref 19–32)
Calcium: 9.5 mg/dL (ref 8.4–10.5)
Chloride: 106 mEq/L (ref 96–112)
Creatinine, Ser: 0.91 mg/dL (ref 0.50–1.10)
Glucose, Bld: 205 mg/dL — ABNORMAL HIGH (ref 70–99)

## 2011-03-30 LAB — CBC
HCT: 44 % (ref 36.0–46.0)
Hemoglobin: 15.6 g/dL — ABNORMAL HIGH (ref 12.0–15.0)
MCH: 30.4 pg (ref 26.0–34.0)
MCV: 85.8 fL (ref 78.0–100.0)
RBC: 5.13 MIL/uL — ABNORMAL HIGH (ref 3.87–5.11)

## 2011-03-30 LAB — POCT I-STAT TROPONIN I

## 2011-03-30 MED ORDER — OXYCODONE-ACETAMINOPHEN 5-325 MG PO TABS
2.0000 | ORAL_TABLET | Freq: Once | ORAL | Status: DC
  Filled 2011-03-30: qty 2

## 2011-03-30 MED ORDER — ONDANSETRON HCL 4 MG/2ML IJ SOLN
4.0000 mg | Freq: Once | INTRAMUSCULAR | Status: AC
  Administered 2011-03-31: 4 mg via INTRAVENOUS
  Filled 2011-03-30: qty 2

## 2011-03-30 MED ORDER — ALBUTEROL SULFATE (5 MG/ML) 0.5% IN NEBU
5.0000 mg | INHALATION_SOLUTION | Freq: Once | RESPIRATORY_TRACT | Status: AC
  Administered 2011-03-30: 5 mg via RESPIRATORY_TRACT
  Filled 2011-03-30: qty 1

## 2011-03-30 MED ORDER — SODIUM CHLORIDE 0.9 % IV BOLUS (SEPSIS): 1000.0000 mL | Freq: Once | INTRAVENOUS | Status: DC

## 2011-03-30 MED ORDER — KETOROLAC TROMETHAMINE 30 MG/ML IJ SOLN
30.0000 mg | Freq: Once | INTRAMUSCULAR | Status: AC
  Administered 2011-03-31: 30 mg via INTRAVENOUS
  Filled 2011-03-30: qty 1

## 2011-03-30 MED ORDER — IPRATROPIUM BROMIDE 0.02 % IN SOLN
0.5000 mg | RESPIRATORY_TRACT | Status: AC
  Administered 2011-03-30: 0.5 mg via RESPIRATORY_TRACT
  Filled 2011-03-30: qty 2.5

## 2011-03-30 NOTE — ED Provider Notes (Signed)
History     CSN: [id number] Arrival date & time: 03/30/2011  8:19 PM   First MD Initiated Contact with Patient 03/30/11 2121      Chief Complaint  Patient presents with  . Palpitations    (Consider location/radiation/quality/duration/timing/severity/associated sxs/prior treatment) HPI Comments: Patient reports to the emergency department with a chief complaint of shortness of breath, cough, and chest palpitations.  While in the emergency department she began having chest pain that was intermittent.  She is currently not having any chest pain but is concerned because it feels similar to when she had her bypass surgery.  Patient states that she's recently had a cold with a productive cough for the past week.  She denies fevers, night sweats, chills, orthopnea, PND, unilateral leg swelling or lower extremity edema, and DOE.  Of note piece patient says she has a history in of possible DVT or thrombophlebitis and she was on Coumadin however she is not taking it now.  She is currently having right calf pain and lower back pain as well.  The history is provided by the patient.    Past Medical History  Diagnosis Date  . Back pain, chronic   . Depression   . Myocardial infarction 11/20/06  . Hypertension   . Hypercholesteremia   . Diabetes mellitus   . Coronary artery disease     Past Surgical History  Procedure Date  . Mandible surgery   . Coronary artery bypass graft   . Knee surgery     Family History  Problem Relation Age of Onset  . Heart disease Mother   . Heart disease Father     History  Substance Use Topics  . Smoking status: Current Everyday Smoker  . Smokeless tobacco: Never Used  . Alcohol Use: No    OB History    Grav Para Term Preterm Abortions TAB SAB Ect Mult Living                  Review of Systems  Constitutional: Positive for fatigue. Negative for fever, chills and diaphoresis.  HENT: Positive for congestion. Negative for sore throat, rhinorrhea,  sneezing, trouble swallowing and sinus pressure.   Eyes: Negative for visual disturbance.  Respiratory: Positive for cough, chest tightness, shortness of breath and wheezing.   Cardiovascular: Positive for chest pain and palpitations.  Gastrointestinal: Negative for nausea, vomiting, abdominal pain, diarrhea and blood in stool.  Genitourinary: Negative for dysuria, urgency, hematuria and flank pain.  Musculoskeletal: Positive for back pain and gait problem.  Skin: Negative for rash.  Neurological: Negative for syncope, weakness, numbness and headaches.  All other systems reviewed and are negative.    Allergies  Ibuprofen; Prednisone; and Morphine  Home Medications   Current Outpatient Rx  Name Route Sig Dispense Refill  . ASPIRIN 81 MG PO TABS Oral Take 81 mg by mouth daily.      Marland Kitchen BIOTIN PO Oral Take 1 tablet by mouth daily.      . DULOXETINE HCL 60 MG PO CPEP Oral Take 60 mg by mouth daily.      . OMEGA-3 FATTY ACIDS 1000 MG PO CAPS Oral Take 2 g by mouth daily.      Marland Kitchen GABAPENTIN 300 MG PO CAPS Oral Take 300 mg by mouth 3 (three) times daily. Take one capsule every morning, 1 every afternoon, and 2 at bedtime    . HYDROCODONE-ACETAMINOPHEN 5-500 MG PO TABS Oral Take 1 tablet by mouth every 6 (six) hours as needed.      Marland Kitchen  METOPROLOL SUCCINATE 25 MG PO TB24 Oral Take 25 mg by mouth daily.      Marland Kitchen ROSUVASTATIN CALCIUM 20 MG PO TABS Oral Take 20 mg by mouth at bedtime.      . TRAMADOL HCL 50 MG PO TABS Oral Take 50 mg by mouth 3 (three) times daily. Takes 2 tabs in the am, one tab at noon, and one tab in the pm    . TRAZODONE HCL 50 MG PO TABS Oral Take 50 mg by mouth at bedtime.        BP 141/69  Pulse 79  Temp 97.8 F (36.6 C)  Resp 18  SpO2 96%  Physical Exam  Nursing note and vitals reviewed. Constitutional: She is oriented to person, place, and time. She appears well-developed and well-nourished. No distress.  HENT:  Head: Normocephalic and atraumatic.  Eyes: EOM are  normal.       Normal appearance  Neck: Normal range of motion. Neck supple.  Cardiovascular: Normal rate, regular rhythm, normal heart sounds and intact distal pulses.   Pulmonary/Chest: Effort normal and breath sounds normal.  Abdominal: Soft. Bowel sounds are normal. She exhibits no distension and no mass. There is no tenderness. There is no rebound and no guarding.       No CVA tenderness  Neurological: She is alert and oriented to person, place, and time.  Skin: Skin is warm and dry. No rash noted.  Psychiatric: She has a normal mood and affect. Her behavior is normal.    ED Course  Procedures (including critical care time)  Labs Reviewed  CBC - Abnormal; Notable for the following:    WBC 10.9 (*)    RBC 5.13 (*)    Hemoglobin 15.6 (*)    All other components within normal limits  BASIC METABOLIC PANEL - Abnormal; Notable for the following:    Potassium 3.3 (*)    Glucose, Bld 205 (*)    GFR calc non Af Amer 73 (*)    GFR calc Af Amer 84 (*)    All other components within normal limits  POCT I-STAT TROPONIN I  I-STAT TROPONIN I  I-STAT TROPONIN I   No results found.   No diagnosis found.  Pt states she currently takes Toradol and Percocet for her back pain.  The same medication will be given here for pain relief.  She states she does not have an allergy to these medications.  Pt presents with URI like symptoms. Acute CAD ruled out with negative troponin x2, CXR & EKG. These results have been discussed with Cardiology.   Dr. Gala Romney the on call cardiologist for [facility] was consulted who states they will follow up with the pt as an OP next week. She has been instructed to call and set up that appointment.   Pt is hemodynamically stable and has no complaints.   MDM  Cough SOB          [place], [place] 03/31/11 [phone]

## 2011-03-30 NOTE — ED Notes (Signed)
PT. REPORTS PALPITATIONS WITH SOB AND PRODUCTIVE COUGH ONSET THIS AFTERNOON .

## 2011-03-31 ENCOUNTER — Encounter (HOSPITAL_COMMUNITY): Payer: Self-pay | Admitting: Radiology

## 2011-03-31 MED ORDER — IOHEXOL 300 MG/ML  SOLN
100.0000 mL | Freq: Once | INTRAMUSCULAR | Status: AC | PRN
  Administered 2011-03-31: 100 mL via INTRAVENOUS

## 2011-03-31 NOTE — ED Provider Notes (Signed)
Medical screening examination/treatment/procedure(s) were performed by non-physician practitioner and as supervising physician I was immediately available for consultation/collaboration.  Ethelda Chick, MD 03/31/11 [phone]

## 2011-03-31 NOTE — ED Notes (Signed)
D/c instructions reviewed w/ pt and family - pt and family deny any further questions or concerns at present.\ 

## 2011-04-05 ENCOUNTER — Ambulatory Visit (INDEPENDENT_AMBULATORY_CARE_PROVIDER_SITE_OTHER): Payer: Self-pay | Admitting: Cardiology

## 2011-04-05 ENCOUNTER — Encounter: Payer: Self-pay | Admitting: Cardiology

## 2011-04-05 DIAGNOSIS — I2510 Atherosclerotic heart disease of native coronary artery without angina pectoris: Secondary | ICD-10-CM

## 2011-04-05 DIAGNOSIS — E7800 Pure hypercholesterolemia, unspecified: Secondary | ICD-10-CM

## 2011-04-05 DIAGNOSIS — I8000 Phlebitis and thrombophlebitis of superficial vessels of unspecified lower extremity: Secondary | ICD-10-CM

## 2011-04-05 DIAGNOSIS — F17200 Nicotine dependence, unspecified, uncomplicated: Secondary | ICD-10-CM

## 2011-04-05 NOTE — Patient Instructions (Addendum)
Your physician has requested that you have an exercise tolerance test. For further information please visit [email or web address] Please also follow instruction sheet, as given.  Your physician has requested that you have an echocardiogram. Echocardiography is a painless test that uses sound waves to create images of your heart. It provides your doctor with information about the size and shape of your heart and how well your heart's chambers and valves are working. This procedure takes approximately one hour. There are no restrictions for this procedure.  Your physician recommends that you continue on your current medications as directed. Please refer to the Current Medication list given to you today.  THE PT WILL CALL BACK TO SCHEDULE GXT AND ECHO AFTER SHE IS FEELING BETTER.

## 2011-04-09 DIAGNOSIS — I8000 Phlebitis and thrombophlebitis of superficial vessels of unspecified lower extremity: Secondary | ICD-10-CM | POA: Insufficient documentation

## 2011-04-09 NOTE — Assessment & Plan Note (Signed)
Currently on statins.  Should have follow up labs.  Not been done in a while.

## 2011-04-09 NOTE — Progress Notes (Signed)
HPI:  [name] is in for a follow up visit.  She is overall doing pretty well.  She denies any chest pain at present.  She did go to see Dr. Donata Clay about her lower sternum, as the lower cartilage was out of line.  No specific treatment was planned at this time.  We did go over in great detail the need to take care of herself so that recurrent problems would not occur.  She is aware of what she needs to do to maintain good cardiac habits---even if she has a way to go on this.  She has continued to smoke, and in review of her vascular study, she did have superficial phlebitis, but not deep.  She was apparently seen in OP clinic for this, although the details are lacking.   Current Outpatient Prescriptions  Medication Sig Dispense Refill  . aspirin 81 MG tablet Take 81 mg by mouth 2 (two) times daily.       Marland Kitchen BIOTIN PO Take 1 tablet by mouth daily.        . DULoxetine (CYMBALTA) 60 MG capsule Take 60 mg by mouth daily.        . fish oil-omega-3 fatty acids 1000 MG capsule Take 2 g by mouth daily.        Marland Kitchen gabapentin (NEURONTIN) 300 MG capsule Take 300 mg by mouth 3 (three) times daily. Take one capsule every morning, 1 every afternoon, and 2 at bedtime      . HYDROcodone-acetaminophen (VICODIN) 5-500 MG per tablet Take 1 tablet by mouth every 6 (six) hours as needed.        . metoprolol succinate (TOPROL-XL) 25 MG 24 hr tablet Take 25 mg by mouth daily.        . rosuvastatin (CRESTOR) 20 MG tablet Take 20 mg by mouth at bedtime.        . traMADol (ULTRAM) 50 MG tablet Take 50 mg by mouth 3 (three) times daily. Takes 2 tabs in the am, one tab at noon, and one tab in the pm      . traZODone (DESYREL) 50 MG tablet Take 50 mg by mouth at bedtime.          Allergies  Allergen Reactions  . Ibuprofen Swelling and Rash  . Prednisone Swelling and Rash  . Morphine     REACTION: Nausea    Past Medical History  Diagnosis Date  . Back pain, chronic   . Depression   . Myocardial infarction  11/20/06  . Hypertension   . Hypercholesteremia   . Diabetes mellitus   . Coronary artery disease     Past Surgical History  Procedure Date  . Mandible surgery   . Coronary artery bypass graft   . Knee surgery     Family History  Problem Relation Age of Onset  . Heart disease Mother   . Cancer Mother   . Heart disease Father   . Stroke Father     History   Social History  . Marital Status: Widowed    Spouse Name: N/A    Number of Children: N/A  . Years of Education: N/A   Occupational History  . Not on file.   Social History Main Topics  . Smoking status: Current Everyday Smoker  . Smokeless tobacco: Never Used  . Alcohol Use: No  . Drug Use: Not on file  . Sexually Active: Not on file   Other Topics Concern  . Not on file  Social History Narrative  . No narrative on file    ROS: Please see the HPI.  All other systems reviewed and negative.  PHYSICAL EXAM:  BP 130/68  Pulse 60  Ht 5\' 2"  (1.575 m)  Wt 71.215 kg (157 lb)  BMI 28.72 kg/m2  General: Well developed, well nourished, very pleasant female,  in no acute distress. Head:  Normocephalic and atraumatic. Neck: no JVD Lungs: Clear to auscultation and percussion. Heart: Normal S1 and S2.  No murmur, rubs or gallops.  Abdomen:  Normal bowel sounds; soft; non tender; no organomegaly Pulses: Pulses normal in all 4 extremities. Extremities: No clubbing or cyanosis. No edema.  Multiple varicosities.  Neurologic: Alert and oriented x 3.  EKG:  WNL  ASSESSMENT AND PLAN:

## 2011-04-09 NOTE — Assessment & Plan Note (Signed)
Not quite sure what the status of this was.  She apparently might have been on warfarin for a bit.  Not currently.  Role of smoking reviewed.

## 2011-04-09 NOTE — Progress Notes (Signed)
Patient ID: [name], female   DOB: [birth year], 50 y.o.   MRN: [id number]

## 2011-04-09 NOTE — Assessment & Plan Note (Addendum)
No active symptoms.  Continue medical regimen.  Encouraged not to smoke.

## 2011-04-09 NOTE — Assessment & Plan Note (Signed)
She understands the variables, and has set a quit date.  Lets see if she can stick to it.

## 2011-04-10 ENCOUNTER — Telehealth: Payer: Self-pay

## 2011-04-10 NOTE — Telephone Encounter (Signed)
[name]  She needs a lipid and liver profile. TS

## 2011-04-12 ENCOUNTER — Telehealth: Payer: Self-pay | Admitting: *Deleted

## 2011-04-12 NOTE — Telephone Encounter (Signed)
Script sent to pt for labs to [facility]

## 2011-04-12 NOTE — Telephone Encounter (Signed)
Spoke with [name], aware dr Riley Kill wants her to have lipid and hepatic checked. She is having blood work at [facility], script sent to [name] for her to have labs done at that time.

## 2011-04-20 ENCOUNTER — Encounter: Payer: Self-pay | Admitting: Cardiology

## 2011-04-28 ENCOUNTER — Encounter: Payer: Self-pay | Admitting: Cardiology

## 2011-05-03 ENCOUNTER — Ambulatory Visit (HOSPITAL_COMMUNITY): Payer: Self-pay | Attending: Cardiology | Admitting: Radiology

## 2011-05-03 DIAGNOSIS — I2510 Atherosclerotic heart disease of native coronary artery without angina pectoris: Secondary | ICD-10-CM | POA: Insufficient documentation

## 2011-05-03 DIAGNOSIS — E7800 Pure hypercholesterolemia, unspecified: Secondary | ICD-10-CM

## 2011-05-03 DIAGNOSIS — E785 Hyperlipidemia, unspecified: Secondary | ICD-10-CM | POA: Insufficient documentation

## 2011-05-08 ENCOUNTER — Telehealth: Payer: Self-pay | Admitting: Cardiology

## 2011-05-08 NOTE — Telephone Encounter (Signed)
PT RTN [name], PLS CALL [phone]

## 2011-05-08 NOTE — Telephone Encounter (Signed)
Pt aware of Echo results by phone.  

## 2011-05-08 NOTE — Telephone Encounter (Signed)
Left message to call back  

## 2011-05-08 NOTE — Telephone Encounter (Signed)
New msg Pt was calling about echo results please call

## 2011-05-15 ENCOUNTER — Encounter: Payer: Self-pay | Admitting: Physician Assistant

## 2011-08-19 ENCOUNTER — Emergency Department (HOSPITAL_COMMUNITY): Payer: Medicare Other

## 2011-08-19 ENCOUNTER — Encounter (HOSPITAL_COMMUNITY): Payer: Self-pay | Admitting: *Deleted

## 2011-08-19 ENCOUNTER — Emergency Department (HOSPITAL_COMMUNITY)
Admission: EM | Admit: 2011-08-19 | Discharge: 2011-08-19 | Disposition: A | Discharge status: Home / Self Care | Payer: Medicare Other | Attending: Emergency Medicine | Admitting: Emergency Medicine

## 2011-08-19 DIAGNOSIS — R0602 Shortness of breath: Secondary | ICD-10-CM | POA: Diagnosis not present

## 2011-08-19 DIAGNOSIS — M549 Dorsalgia, unspecified: Secondary | ICD-10-CM | POA: Insufficient documentation

## 2011-08-19 DIAGNOSIS — E119 Type 2 diabetes mellitus without complications: Secondary | ICD-10-CM | POA: Insufficient documentation

## 2011-08-19 DIAGNOSIS — G8929 Other chronic pain: Secondary | ICD-10-CM | POA: Diagnosis not present

## 2011-08-19 DIAGNOSIS — M25559 Pain in unspecified hip: Secondary | ICD-10-CM | POA: Diagnosis not present

## 2011-08-19 DIAGNOSIS — S0180XA Unspecified open wound of other part of head, initial encounter: Secondary | ICD-10-CM | POA: Diagnosis not present

## 2011-08-19 DIAGNOSIS — R079 Chest pain, unspecified: Secondary | ICD-10-CM | POA: Insufficient documentation

## 2011-08-19 DIAGNOSIS — W19XXXA Unspecified fall, initial encounter: Secondary | ICD-10-CM

## 2011-08-19 DIAGNOSIS — F329 Major depressive disorder, single episode, unspecified: Secondary | ICD-10-CM | POA: Diagnosis not present

## 2011-08-19 DIAGNOSIS — E7800 Pure hypercholesterolemia, unspecified: Secondary | ICD-10-CM | POA: Diagnosis not present

## 2011-08-19 DIAGNOSIS — S2239XA Fracture of one rib, unspecified side, initial encounter for closed fracture: Secondary | ICD-10-CM | POA: Diagnosis not present

## 2011-08-19 DIAGNOSIS — M25519 Pain in unspecified shoulder: Secondary | ICD-10-CM | POA: Insufficient documentation

## 2011-08-19 DIAGNOSIS — W108XXA Fall (on) (from) other stairs and steps, initial encounter: Secondary | ICD-10-CM | POA: Insufficient documentation

## 2011-08-19 DIAGNOSIS — Z79899 Other long term (current) drug therapy: Secondary | ICD-10-CM | POA: Insufficient documentation

## 2011-08-19 DIAGNOSIS — I2510 Atherosclerotic heart disease of native coronary artery without angina pectoris: Secondary | ICD-10-CM | POA: Insufficient documentation

## 2011-08-19 DIAGNOSIS — Z9181 History of falling: Secondary | ICD-10-CM | POA: Diagnosis not present

## 2011-08-19 DIAGNOSIS — S0990XA Unspecified injury of head, initial encounter: Secondary | ICD-10-CM | POA: Insufficient documentation

## 2011-08-19 DIAGNOSIS — I252 Old myocardial infarction: Secondary | ICD-10-CM | POA: Insufficient documentation

## 2011-08-19 DIAGNOSIS — S0181XA Laceration without foreign body of other part of head, initial encounter: Secondary | ICD-10-CM

## 2011-08-19 DIAGNOSIS — F3289 Other specified depressive episodes: Secondary | ICD-10-CM | POA: Diagnosis not present

## 2011-08-19 DIAGNOSIS — M545 Low back pain, unspecified: Secondary | ICD-10-CM | POA: Diagnosis not present

## 2011-08-19 DIAGNOSIS — I10 Essential (primary) hypertension: Secondary | ICD-10-CM | POA: Diagnosis not present

## 2011-08-19 DIAGNOSIS — Z7982 Long term (current) use of aspirin: Secondary | ICD-10-CM | POA: Diagnosis not present

## 2011-08-19 DIAGNOSIS — R51 Headache: Secondary | ICD-10-CM | POA: Insufficient documentation

## 2011-08-19 LAB — URINALYSIS, ROUTINE W REFLEX MICROSCOPIC
Ketones, ur: NEGATIVE mg/dL
Leukocytes, UA: NEGATIVE
Nitrite: NEGATIVE
Specific Gravity, Urine: 1.022 (ref 1.005–1.030)
pH: 5.5 (ref 5.0–8.0)

## 2011-08-19 MED ORDER — OXYCODONE-ACETAMINOPHEN 5-325 MG PO TABS
1.0000 | ORAL_TABLET | Freq: Once | ORAL | Status: AC
  Administered 2011-08-19: 1 via ORAL
  Filled 2011-08-19: qty 1

## 2011-08-19 MED ORDER — DIPHENHYDRAMINE HCL 25 MG PO CAPS
25.0000 mg | ORAL_CAPSULE | Freq: Once | ORAL | Status: AC
  Administered 2011-08-19: 25 mg via ORAL
  Filled 2011-08-19: qty 1

## 2011-08-19 MED ORDER — TETANUS-DIPHTH-ACELL PERTUSSIS 5-2.5-18.5 LF-MCG/0.5 IM SUSP
0.5000 mL | Freq: Once | INTRAMUSCULAR | Status: AC
  Administered 2011-08-19: 0.5 mL via INTRAMUSCULAR
  Filled 2011-08-19: qty 0.5

## 2011-08-19 MED ORDER — OXYCODONE-ACETAMINOPHEN 5-325 MG PO TABS: 1.0000 | ORAL_TABLET | Freq: Four times a day (QID) | ORAL | Status: AC | PRN

## 2011-08-19 NOTE — Discharge Instructions (Signed)
Return to the ED with any concerns including difficulty breathing, vomiting, seizure activity, fainting, redness or drainage from wound, fever, or any other alarming symptoms  You should use the incentive spirometer approximately 10 times per hour to help prevent you from developing pneumonia

## 2011-08-19 NOTE — ED Notes (Signed)
To ED for eval after falling down 6 steps and hitting head on concrete blocks. Right side rib pain, head pain, and right shoulder pain. Denies neck pain. Speaks in full clear sentences.

## 2011-08-19 NOTE — ED Provider Notes (Signed)
I saw and evaluated the patient, reviewed the resident's note and I agree with the findings and plan.  I saw this patient primarily, laceration was repaired by resident  Ethelda Chick, MD 08/19/11 [phone]

## 2011-08-19 NOTE — ED Provider Notes (Signed)
History     CSN: [id number]  Arrival date & time 08/19/11  1724   First MD Initiated Contact with Patient 08/19/11 1755      Chief Complaint  Patient presents with  . Fall  . Shortness of Breath    (Consider location/radiation/quality/duration/timing/severity/associated sxs/prior treatment) HPI Patient presents after a fall tonight. She states that she was at home and fell down her front steps and hit head on concrete. She estimates she fell down 6 steps. She complains of pain in her right ribs, right shoulder and right side of her low back. She didn't strike her head. She denies losing consciousness. She's had no vomiting or seizure activity. She denies any pain in her neck. She states that movement and palpation as well as taking deep breaths makes her pain worse. She has not taken anything prior to arrival for pain. There no other associated systemic symptoms. There no other alleviating or modifying factors. She denies using alcohol or other illicit substances tonight prior to the fall  Past Medical History  Diagnosis Date  . Back pain, chronic   . Depression   . Myocardial infarction 11/20/06  . Hypertension   . Hypercholesteremia   . Diabetes mellitus   . Coronary artery disease     Past Surgical History  Procedure Date  . Mandible surgery   . Coronary artery bypass graft   . Knee surgery     Family History  Problem Relation Age of Onset  . Heart disease Mother   . Cancer Mother   . Heart disease Father   . Stroke Father     History  Substance Use Topics  . Smoking status: Current Everyday Smoker  . Smokeless tobacco: Never Used  . Alcohol Use: No    OB History    Grav Para Term Preterm Abortions TAB SAB Ect Mult Living                  Review of Systems ROS reviewed and all otherwise negative except for mentioned in HPI  Allergies  Ibuprofen; Prednisone; Morphine; and Coconut oil  Home Medications   Current Outpatient Rx  Name Route Sig Dispense  Refill  . ASPIRIN 81 MG PO TABS Oral Take 81 mg by mouth 2 (two) times daily.     Marland Kitchen BIOTIN PO Oral Take 1 tablet by mouth daily.      . DULOXETINE HCL 60 MG PO CPEP Oral Take 60 mg by mouth daily.      . OMEGA-3 FATTY ACIDS 1000 MG PO CAPS Oral Take 2 g by mouth daily.      Marland Kitchen GABAPENTIN 300 MG PO CAPS Oral Take 300 mg by mouth 3 (three) times daily. Take one capsule every morning, 1 every afternoon, and 2 at bedtime    . HYDROCODONE-ACETAMINOPHEN 5-500 MG PO TABS Oral Take 1 tablet by mouth every 6 (six) hours as needed.      Marland Kitchen MELATONIN 10 MG PO CAPS Oral Take 1 tablet by mouth daily.    Marland Kitchen METOPROLOL SUCCINATE ER 25 MG PO TB24 Oral Take 25 mg by mouth daily.      Marland Kitchen ROSUVASTATIN CALCIUM 20 MG PO TABS Oral Take 20 mg by mouth at bedtime.      . TRAMADOL HCL 50 MG PO TABS Oral Take 50 mg by mouth 3 (three) times daily. Takes 2 tabs in the am, one tab at noon, and one tab in the pm    . TRAZODONE HCL 50 MG  PO TABS Oral Take 50 mg by mouth at bedtime.      . OXYCODONE-ACETAMINOPHEN 5-325 MG PO TABS Oral Take 1-2 tablets by mouth every 6 (six) hours as needed for pain. 15 tablet 0    BP 125/89  Pulse 67  Temp(Src) 98.3 F (36.8 C) (Oral)  Resp 16  SpO2 100% Vitals reviewed Physical Exam Physical Examination: General appearance - alert, uncomfortable appearing, and in no distress Mental status - alert, oriented to person, place, and time Head- contusion over right forehead, brow Face- no significant bony tenderness to palpation, no midface tenderness or instability Eyes - pupils equal and reactive, extraocular eye movements intact Mouth - mucous membranes moist, pharynx normal without lesions Chest - breath sounds symmetric clear to auscultation, no wheezes, rales or rhonchi, symmetric air entry, chest wall with tenderness to palpation on right side at mid axillary line, no crepitus, no step offs Heart - normal rate, regular rhythm, normal S1, S2, no murmurs, rubs, clicks or gallops Abdomen  - soft, nontender, nondistended, no masses or organomegaly Back- lumbar ttp, no c/t tenderness. Also right paraspinous tenderness and CVA tenderness Musculoskeletal - pain with ROM of right shoulder and right hip, no bony point tenderness, otherwise no joint tenderness, deformity or swelling, extremities distally NVI Extremities - peripheral pulses normal, no pedal edema, no clubbing or cyanosis Skin- small abrasion/superficial laceration < 1cm of right forehead Psych-anxious, cooperative  ED Course  Procedures (including critical care time)   Labs Reviewed  URINALYSIS, ROUTINE W REFLEX MICROSCOPIC  LAB REPORT - SCANNED   No results found.   1. Rib fracture   2. Fall   3. Minor head injury   4. Laceration of forehead       MDM  Patient presents after a fall with pain in her right hip right shoulder low back right chest wall and after having this heavy overhead. Workup in the ED reveals a nondisplaced right-sided rib fracture with no associated pneumothorax or hemothorax. She otherwise had reassuring x-rays. A small laceration over her right brow/forehead was repaired by the resident as in a separate procedure note was documented. She was given pain control as well as incentive spirometer for her rib fracture. She was discharged with strict return precautions and was agreeable with this plan.        Ethelda Chick, MD 08/24/11 [phone]

## 2011-08-19 NOTE — ED Provider Notes (Signed)
History     CSN: [id number]  Arrival date & time 08/19/11  1724   First MD Initiated Contact with Patient 08/19/11 1755      Chief Complaint  Patient presents with  . Fall  . Shortness of Breath    (Consider location/radiation/quality/duration/timing/severity/associated sxs/prior treatment) HPI  Past Medical History  Diagnosis Date  . Back pain, chronic   . Depression   . Myocardial infarction 11/20/06  . Hypertension   . Hypercholesteremia   . Diabetes mellitus   . Coronary artery disease     Past Surgical History  Procedure Date  . Mandible surgery   . Coronary artery bypass graft   . Knee surgery     Family History  Problem Relation Age of Onset  . Heart disease Mother   . Cancer Mother   . Heart disease Father   . Stroke Father     History  Substance Use Topics  . Smoking status: Current Everyday Smoker  . Smokeless tobacco: Never Used  . Alcohol Use: No    OB History    Grav Para Term Preterm Abortions TAB SAB Ect Mult Living                  Review of Systems  Allergies  Ibuprofen; Prednisone; Morphine; and Coconut oil  Home Medications   Current Outpatient Rx  Name Route Sig Dispense Refill  . ASPIRIN 81 MG PO TABS Oral Take 81 mg by mouth 2 (two) times daily.     Marland Kitchen BIOTIN PO Oral Take 1 tablet by mouth daily.      . DULOXETINE HCL 60 MG PO CPEP Oral Take 60 mg by mouth daily.      . OMEGA-3 FATTY ACIDS 1000 MG PO CAPS Oral Take 2 g by mouth daily.      Marland Kitchen GABAPENTIN 300 MG PO CAPS Oral Take 300 mg by mouth 3 (three) times daily. Take one capsule every morning, 1 every afternoon, and 2 at bedtime    . HYDROCODONE-ACETAMINOPHEN 5-500 MG PO TABS Oral Take 1 tablet by mouth every 6 (six) hours as needed.      Marland Kitchen MELATONIN 10 MG PO CAPS Oral Take 1 tablet by mouth daily.    Marland Kitchen METOPROLOL SUCCINATE ER 25 MG PO TB24 Oral Take 25 mg by mouth daily.      Marland Kitchen ROSUVASTATIN CALCIUM 20 MG PO TABS Oral Take 20 mg by mouth at bedtime.      . TRAMADOL HCL  50 MG PO TABS Oral Take 50 mg by mouth 3 (three) times daily. Takes 2 tabs in the am, one tab at noon, and one tab in the pm    . TRAZODONE HCL 50 MG PO TABS Oral Take 50 mg by mouth at bedtime.      . OXYCODONE-ACETAMINOPHEN 5-325 MG PO TABS Oral Take 1-2 tablets by mouth every 6 (six) hours as needed for pain. 15 tablet 0    BP 124/65  Pulse 64  Temp(Src) 98.1 F (36.7 C) (Oral)  Resp 22  SpO2 98%  Physical Exam  ED Course  LACERATION REPAIR Date/Time: 08/19/2011 11:08 PM Performed by: Consuello Masse Authorized by: Ethelda Chick Consent: Verbal consent obtained. Patient identity confirmed: verbally with patient and arm band Body area: head/neck Location details: forehead Laceration length: 1 cm Foreign bodies: no foreign bodies Tendon involvement: none Nerve involvement: none Vascular damage: no Anesthesia method: Given lac repair was only 2 sutures pt opted for no local anesthetic.  Patient sedated: no Preparation: Patient was prepped and draped in the usual sterile fashion. Irrigation solution: saline Irrigation method: syringe Amount of cleaning: standard Debridement: none Degree of undermining: none Skin closure: 5-0 nylon Number of sutures: 2 Technique: simple Approximation: close Approximation difficulty: simple Patient tolerance: Patient tolerated the procedure well with no immediate complications.   (including critical care time)   Labs Reviewed  URINALYSIS, ROUTINE W REFLEX MICROSCOPIC   Dg Ribs Unilateral W/chest Right  08/19/2011  *RADIOLOGY REPORT*  Clinical Data: Fall, shortness of breath, right rib pain  RIGHT RIBS AND CHEST - 3+ VIEW  Comparison: CT chest dated 03/30/2011  Findings: Lungs are essentially clear. No pleural effusion or pneumothorax.  The heart is top normal in size. Postsurgical changes related to prior CABG.  Right lateral 7th rib fracture.  IMPRESSION: Right lateral 7th rib fracture.  Original Report Authenticated By: Charline Bills, M.D.   Dg Lumbar Spine Complete  08/19/2011  *RADIOLOGY REPORT*  Clinical Data: Fall, low back pain  LUMBAR SPINE - COMPLETE 4+ VIEW  Comparison: MRI lumbar spine dated 02/08/2010  Findings: Five lumbar-type vertebral bodies.  Mild straightening of the lumbar spine, unchanged.  No evidence of fracture or dislocation.  Vertebral body heights are maintained.  Mild multilevel degenerative changes, most prominent at L5-S1.  The visualized bony pelvis appears intact.  Surgical clips in the left upper abdomen.  IMPRESSION: No fracture or dislocation is seen.  Mild multilevel degenerative changes.  Original Report Authenticated By: Charline Bills, M.D.   Dg Shoulder Right  08/19/2011  *RADIOLOGY REPORT*  Clinical Data: Fall, right shoulder/scapula pain  RIGHT SHOULDER - 2+ VIEW  Comparison: None.  Findings: No fracture or dislocation is seen.  The joint spaces are preserved.  The visualized soft tissues are unremarkable.  Visualized right lung is clear.  IMPRESSION: No fracture or dislocation is seen.  Original Report Authenticated By: Charline Bills, M.D.   Dg Hip Complete Right  08/19/2011  *RADIOLOGY REPORT*  Clinical Data: Fall, right hip pain  RIGHT HIP - COMPLETE 2+ VIEW  Comparison: None.  Findings: No fracture or dislocation is seen.  The bilateral hip joint spaces are symmetric.  Visualized bony pelvis appears intact.  Degenerative changes of the lower lumbar spine.  IMPRESSION: No fracture or dislocation is seen.  Original Report Authenticated By: Charline Bills, M.D.   Ct Head Wo Contrast  08/19/2011  *RADIOLOGY REPORT*  Clinical Data: The patient fell.  Shortness of breath.  CT HEAD WITHOUT CONTRAST  Technique:  Contiguous axial images were obtained from the base of the skull through the vertex without contrast.  Comparison: None.  Findings: The study is technically limited due to patient positioning.  As imaged, the ventricles and sulci appear symmetrical.  No mass effect or midline  shift.  No abnormal extra- axial fluid collections.  Gray-white matter junctions appear distinct.  Basal cisterns are not effaced.  Ventricles are not dilated.  No evidence of acute intracranial hemorrhage.  There is some mucosal thickening in the paranasal sinuses without acute air- fluid level.  No depressed fractures in the skull.  IMPRESSION: No acute intracranial abnormalities demonstrated.  Original Report Authenticated By: Marlon Pel, M.D.     1. Rib fracture   2. Fall   3. Minor head injury   4. Laceration of forehead       MDM  See procedure note.        Consuello Masse, MD 08/19/11 [phone]

## 2011-08-19 NOTE — ED Notes (Signed)
Resident aware pt is in room with suture cart.

## 2011-09-01 DIAGNOSIS — M545 Low back pain, unspecified: Secondary | ICD-10-CM | POA: Diagnosis not present

## 2011-10-31 DIAGNOSIS — M545 Low back pain, unspecified: Secondary | ICD-10-CM | POA: Diagnosis not present

## 2011-10-31 DIAGNOSIS — R69 Illness, unspecified: Secondary | ICD-10-CM | POA: Diagnosis not present

## 2011-10-31 DIAGNOSIS — I8000 Phlebitis and thrombophlebitis of superficial vessels of unspecified lower extremity: Secondary | ICD-10-CM | POA: Diagnosis not present

## 2011-10-31 DIAGNOSIS — M4800 Spinal stenosis, site unspecified: Secondary | ICD-10-CM | POA: Diagnosis not present

## 2011-10-31 DIAGNOSIS — E785 Hyperlipidemia, unspecified: Secondary | ICD-10-CM | POA: Diagnosis not present

## 2011-11-01 ENCOUNTER — Other Ambulatory Visit (HOSPITAL_COMMUNITY): Payer: Self-pay | Admitting: Family Medicine

## 2011-11-01 ENCOUNTER — Ambulatory Visit (HOSPITAL_COMMUNITY)
Admission: RE | Admit: 2011-11-01 | Discharge: 2011-11-01 | Disposition: A | Discharge status: Home / Self Care | Payer: Medicare Other | Source: Ambulatory Visit | Attending: Family Medicine | Admitting: Family Medicine

## 2011-11-01 DIAGNOSIS — R930 Abnormal findings on diagnostic imaging of skull and head, not elsewhere classified: Secondary | ICD-10-CM | POA: Diagnosis not present

## 2011-11-01 DIAGNOSIS — Z4789 Encounter for other orthopedic aftercare: Secondary | ICD-10-CM | POA: Diagnosis not present

## 2011-11-01 DIAGNOSIS — F4310 Post-traumatic stress disorder, unspecified: Secondary | ICD-10-CM | POA: Diagnosis not present

## 2011-11-01 DIAGNOSIS — R52 Pain, unspecified: Secondary | ICD-10-CM | POA: Diagnosis not present

## 2011-11-01 DIAGNOSIS — S2239XA Fracture of one rib, unspecified side, initial encounter for closed fracture: Secondary | ICD-10-CM | POA: Diagnosis not present

## 2011-11-01 DIAGNOSIS — F319 Bipolar disorder, unspecified: Secondary | ICD-10-CM | POA: Diagnosis not present

## 2011-11-01 DIAGNOSIS — W19XXXA Unspecified fall, initial encounter: Secondary | ICD-10-CM | POA: Insufficient documentation

## 2011-11-22 DIAGNOSIS — F4310 Post-traumatic stress disorder, unspecified: Secondary | ICD-10-CM | POA: Diagnosis not present

## 2011-11-22 DIAGNOSIS — F319 Bipolar disorder, unspecified: Secondary | ICD-10-CM | POA: Diagnosis not present

## 2012-01-23 DIAGNOSIS — F4310 Post-traumatic stress disorder, unspecified: Secondary | ICD-10-CM | POA: Diagnosis not present

## 2012-01-23 DIAGNOSIS — F319 Bipolar disorder, unspecified: Secondary | ICD-10-CM | POA: Diagnosis not present

## 2012-02-01 DIAGNOSIS — F4310 Post-traumatic stress disorder, unspecified: Secondary | ICD-10-CM | POA: Diagnosis not present

## 2012-02-01 DIAGNOSIS — F319 Bipolar disorder, unspecified: Secondary | ICD-10-CM | POA: Diagnosis not present

## 2012-02-08 ENCOUNTER — Ambulatory Visit (INDEPENDENT_AMBULATORY_CARE_PROVIDER_SITE_OTHER): Payer: Medicare Other | Admitting: Family Medicine

## 2012-02-08 VITALS — BP 122/58 | HR 76 | Temp 98.7°F | Resp 16 | Ht 62.0 in | Wt 159.4 lb

## 2012-02-08 DIAGNOSIS — E7800 Pure hypercholesterolemia, unspecified: Secondary | ICD-10-CM | POA: Diagnosis not present

## 2012-02-08 DIAGNOSIS — Z79899 Other long term (current) drug therapy: Secondary | ICD-10-CM | POA: Diagnosis not present

## 2012-02-08 DIAGNOSIS — I8000 Phlebitis and thrombophlebitis of superficial vessels of unspecified lower extremity: Secondary | ICD-10-CM

## 2012-02-08 DIAGNOSIS — R7309 Other abnormal glucose: Secondary | ICD-10-CM | POA: Diagnosis not present

## 2012-02-08 DIAGNOSIS — E119 Type 2 diabetes mellitus without complications: Secondary | ICD-10-CM | POA: Diagnosis not present

## 2012-02-08 DIAGNOSIS — M549 Dorsalgia, unspecified: Secondary | ICD-10-CM

## 2012-02-08 DIAGNOSIS — I2510 Atherosclerotic heart disease of native coronary artery without angina pectoris: Secondary | ICD-10-CM | POA: Diagnosis not present

## 2012-02-08 DIAGNOSIS — R7303 Prediabetes: Secondary | ICD-10-CM | POA: Insufficient documentation

## 2012-02-08 DIAGNOSIS — G589 Mononeuropathy, unspecified: Secondary | ICD-10-CM

## 2012-02-08 DIAGNOSIS — K219 Gastro-esophageal reflux disease without esophagitis: Secondary | ICD-10-CM

## 2012-02-08 DIAGNOSIS — G629 Polyneuropathy, unspecified: Secondary | ICD-10-CM | POA: Insufficient documentation

## 2012-02-08 DIAGNOSIS — M48061 Spinal stenosis, lumbar region without neurogenic claudication: Secondary | ICD-10-CM | POA: Insufficient documentation

## 2012-02-08 LAB — COMPREHENSIVE METABOLIC PANEL
Albumin: 4.1 g/dL (ref 3.5–5.2)
Alkaline Phosphatase: 109 U/L (ref 39–117)
BUN: 12 mg/dL (ref 6–23)
Creat: 0.79 mg/dL (ref 0.50–1.10)
Glucose, Bld: 100 mg/dL — ABNORMAL HIGH (ref 70–99)
Potassium: 4.2 mEq/L (ref 3.5–5.3)

## 2012-02-08 MED ORDER — METOPROLOL SUCCINATE ER 25 MG PO TB24: 25.0000 mg | ORAL_TABLET | Freq: Every day | ORAL | Status: DC

## 2012-02-08 MED ORDER — GABAPENTIN 400 MG PO CAPS: 800.0000 mg | ORAL_CAPSULE | Freq: Three times a day (TID) | ORAL | Status: DC

## 2012-02-08 MED ORDER — LISINOPRIL 5 MG PO TABS: 5.0000 mg | ORAL_TABLET | Freq: Every day | ORAL | Status: DC

## 2012-02-08 MED ORDER — ROSUVASTATIN CALCIUM 20 MG PO TABS: 20.0000 mg | ORAL_TABLET | Freq: Every day | ORAL | Status: DC

## 2012-02-08 MED ORDER — OMEPRAZOLE 40 MG PO CPDR: 40.0000 mg | DELAYED_RELEASE_CAPSULE | Freq: Every day | ORAL | Status: DC

## 2012-02-08 MED ORDER — OXYCODONE-ACETAMINOPHEN 5-325 MG PO TABS: 1.0000 | ORAL_TABLET | Freq: Three times a day (TID) | ORAL | Status: DC | PRN

## 2012-02-08 NOTE — Progress Notes (Signed)
Subjective:    Patient ID: [name], female    DOB: 09/09/1960, 51 y.o.   MRN: [id number] Chief Complaint  Patient presents with  . 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 She is a former pt of mine from [facility] and is here to re-establish care.  Went to [employer] w/ bipolar and PTSD and realized she been living w/ it for a while.  Doing seroquiel 150 and 50 prn qhs and helping sleeping and slight better in moods.  But gaining weight Sugar has been running high 200s fasting Vision changing Needing more vicodin has right leg and foot becoming more painful and now traveling to left low back and hip.  Keeping on falling from Rt foot and leg being numb.  Gabapentin definitely helping. Doesn't know if it is idiopathic or from worsening spintal stenosis or DM. Ribs still hurting from injury.  Past Medical History  Diagnosis Date  . Back pain, chronic   . Depression   . Myocardial infarction 11/20/06  . Hypertension   . Hypercholesteremia   . Coronary artery disease   . Allergy   . Arthritis   . Neuromuscular disorder   . Heart attack   . Bronchitis     hx-no inhalers at home  . Diabetes mellitus     diet controlled  . DDD (degenerative disc disease), lumbar    Current Outpatient Prescriptions on File Prior to Visit  Medication Sig Dispense Refill  . aspirin 81 MG tablet Take 81 mg by mouth 2 (two) times daily.       . DULoxetine (CYMBALTA) 60 MG capsule Take 60 mg by mouth daily.        . fish oil-omega-3 fatty acids 1000 MG capsule Take 2 g by mouth daily.         No current facility-administered medications on file prior to visit.   Allergies  Allergen Reactions  . Ibuprofen Swelling and Rash  . Prednisone Swelling and Rash  .  Morphine     REACTION: Nausea  . Coconut Oil Swelling and Rash     Review of Systems  Constitutional: Positive for unexpected weight change. Negative for fever and chills.  Eyes: Positive for visual disturbance.  Respiratory: Positive for choking. Negative for cough and shortness of breath.   Cardiovascular: Positive for chest pain and leg swelling. Negative for palpitations.  Gastrointestinal: Negative for nausea, vomiting, abdominal pain, diarrhea and constipation.  Genitourinary: Positive for flank pain. Negative for urgency, frequency, decreased urine volume and difficulty urinating.  Musculoskeletal: Positive for myalgias, back pain, arthralgias and gait problem. Negative for joint swelling.  Skin: Positive for color change. Negative for rash.  Neurological: Positive for tremors, weakness and numbness. Negative for seizures.  Hematological: Bruises/bleeds easily.  Psychiatric/Behavioral: Positive for sleep disturbance. Negative for dysphoric mood. The patient is not nervous/anxious.       BP 122/58  Pulse 76  Temp(Src) 98.7 F (37.1 C) (Oral)  Resp 16  Ht 5\' 2"  (1.575 m)  Wt 159 lb 6.4 oz (72.303 kg)  BMI 29.15 kg/m2  SpO2 94% Objective:   Physical Exam  Constitutional: She is oriented to person, place, and time. She appears well-developed and well-nourished. No distress.  HENT:  Head: Normocephalic and atraumatic.  Right Ear: External ear normal.  Left Ear: External ear normal.  Eyes: Conjunctivae are normal. No scleral icterus.  Neck: Normal range of motion. Neck supple. No thyromegaly present.  Cardiovascular: Normal rate, regular rhythm, normal heart sounds and intact distal pulses.   Pulmonary/Chest: Effort normal and breath sounds normal. No respiratory distress.  Musculoskeletal: Normal range of motion. She exhibits no edema.       Right hip: Normal.       Left hip: Normal.       Lumbar back: She exhibits tenderness, pain and spasm. She exhibits normal range of  motion, no bony tenderness and no deformity.  Lymphadenopathy:    She has no cervical adenopathy.  Neurological: She is alert and oriented to person, place, and time. She displays atrophy. No sensory deficit. She exhibits abnormal muscle tone. Coordination and gait abnormal.  Reflex Scores:      Patellar reflexes are 0 on the right side and 1+ on the left side.      Achilles reflexes are 0 on the right side and 1+ on the left side. Skin: Skin is warm and dry. She is not diaphoretic. No erythema.  Psychiatric: She has a normal mood and affect. Her behavior is normal.          Results for orders placed in visit on 02/08/12  COMPREHENSIVE METABOLIC PANEL      Result Value Range   Sodium 143  135 - 145 mEq/L   Potassium 4.2  3.5 - 5.3 mEq/L   Chloride 109  96 - 112 mEq/L   CO2 26  19 - 32 mEq/L   Glucose, Bld 100 (*) 70 - 99 mg/dL   BUN 12  6 - 23 mg/dL   Creat 1.61  0.96 - 0.45 mg/dL   Total Bilirubin 0.4  0.3 - 1.2 mg/dL   Alkaline Phosphatase 109  39 - 117 U/L   AST 13  0 - 37 U/L   ALT <8  0 - 35 U/L   Total Protein 6.7  6.0 - 8.3 g/dL   Albumin 4.1  3.5 - 5.2 g/dL   Calcium 9.6  8.4 - 40.9 mg/dL  MICROALBUMIN, URINE      Result Value Range   Microalb, Ur 0.50  0.00 - 1.89 mg/dL  POCT GLYCOSYLATED HEMOGLOBIN (HGB A1C)      Result Value Range   Hemoglobin A1C 5.9      Assessment & Plan:   HYPERCHOLESTEROLEMIA - goal LDL <70. Check at f/u  Diabetes mellitus - Plan: POCT glycosylated hemoglobin (Hb A1C), Microalbumin, urine, Ambulatory referral to Ophthalmology - diet controlled and a1c in pre-DM range so really pt is now qualifying into pre-DM!!  Unfortunately, she is gaining wait due to her MSK prob so continue to monitor.  CAD - s/p MI  BACK PAIN, CHRONIC due to lumbar spinal stenosis with new/worsening neuropathy/radiculopathy- Plan: MR Lumbar Spine Wo Contrast  Superficial phlebitis and thrombophlebitis of the leg - stable  Encounter for long-term (current) use  of other medications - Plan: Comprehensive metabolic panel, DRUG SCREEN, URINE, NO CONFIRMATION  Gastroesophageal reflux disease  Meds ordered this encounter  Medications  . QUEtiapine (SEROQUEL) 100 MG tablet    Sig: Take 300 mg by mouth at bedtime. Takes 150 mg at night and then if trouble falling asleep takes 50 mg extended release  . oxyCODONE-acetaminophen (ROXICET) 5-325 MG per tablet    Sig: Take 1 tablet by mouth every 8 (eight) hours as needed for pain.  Dispense:  90 tablet    Refill:  0  . gabapentin (NEURONTIN) 400 MG capsule    Sig: Take 2 capsules (800 mg total) by mouth 3 (three) times daily. Take one capsule every morning, 1 every afternoon, and 2 at bedtime    Dispense:  180 capsule    Refill:  2  . lisinopril (PRINIVIL,ZESTRIL) 5 MG tablet    Sig: Take 1 tablet (5 mg total) by mouth daily.    Dispense:  30 tablet    Refill:  1  . omeprazole (PRILOSEC) 40 MG capsule    Sig: Take 1 capsule (40 mg total) by mouth daily.    Dispense:  30 capsule    Refill:  3  . rosuvastatin (CRESTOR) 20 MG tablet    Sig: Take 1 tablet (20 mg total) by mouth at bedtime.    Dispense:  30 tablet    Refill:  2  . metoprolol succinate (TOPROL-XL) 25 MG 24 hr tablet    Sig: Take 1 tablet (25 mg total) by mouth daily.    Dispense:  90 tablet    Refill:  3

## 2012-02-08 NOTE — Patient Instructions (Addendum)
Diabetic Neuropathy  Diabetic neuropathy is a common complication caused by diabetes. Neuropathy is a term that means nerve disease or damage. If your diabetes is uncontrolled and you have high blood glucose (sugar) levels, over time, this can lead to damage to nerves throughout your body. There are three types of diabetic neuropathy:   · Peripheral.  · Autonomic.  · Focal.  PERIPHERAL NEUROPATHY  Peripheral neuropathy is the most common form of diabetic neuropathy. It causes damage to the nerves of the feet and legs and eventually the hands and arms.   SYMPTOMS   Peripheral neuropathy occurs slowly over time. The peripheral nerves sense touch, hot and cold, and pain. When these nerves no longer work:   · Your feet become numb.  · You can no longer feel pressure or pain in your feet.  · You may have burning, stabbing or aching pain.  This can lead to:  · Thick calluses over pressure areas.  · Pressure sores.  · Ulcers. Ulcers can become infected with germs (bacteria) and can even lead to infection in the bones of the feet.  DIAGNOSIS   The diagnosis of diabetic neuropathy is difficult at best. Sensory function testing can be done with:  · Light touch using a monofilament.  · Vibration with tuning fork.  · Sharp sensation with pin prick  Other tests that can help diagnose neuropathy are:  · Nerve Conduction Velocities (NCV). This checks the transmission of electrical current through a nerve.  · Electromyography (EMG). This shows how muscles respond to electrical signals transmitted by nearby nerves.  · Quantitative sensory testing, which is used to assess how your nerves respond to vibration and changes in temperature.  AUTONOMIC NEUROPATHY  The autonomic nervous system controls functions that you do not think about. Examples would be:   · Heart beat.  · Regulation of body temperature.  · Blood pressure.  · Urination.  · Digestion.  · Sweating.  · Sexual function.  SYMPTOMS   The symptoms of autonomic neuropathy vary  depending on which nerves are affected.   · There can be problems with digestion such as:  · Feeling sick to your stomach (nausea).  · Vomiting.  · Bloating.  · Constipation.  · Diarrhea.  · Abdominal pain.  · Difficulty with urination may occur because of the inability to sense when your bladder is full. You may have urine leakage (incontinence) or inability to empty your bladder completely (retention).  · Palpitations or a feeling of an abnormal heart beat.  · Blood pressure drops on arising (orthostatic hypotension). This can happen when you first sit up or stand up. It causes you to feel:  · Dizzy.  · Weak.  · Faint.  · Sexual functioning:  · In men, inability to attain and maintain an erection.  · In women, vaginal dryness and problems with decreased sexual desire and arousal.  DIAGNOSIS   Diagnosis is often based on reported symptoms. Tell your medical caregiver if you experience:   · Dizziness.  · Constipation.  · Diarrhea.  · Inappropriate urination or inability to urinate.  · Inability to get or maintain an erection.  Tests that may be done include:  · An EKG or Holter Monitor. These are tests that can help show problems with the heart rate or heart rhythm.  · X-rays can be used to find if there are problems with your ability to properly empty food from your stomach into the small intestine after eating.  FOCAL   NEUROPATHY  Focal neuropathy affects just one nerve tract and occurs suddenly. However, it usually improves by itself over time. It does not cause long term damage, and treatments are usually needed only until the problem improves.  SYMPTOMS   Examples include:   · Abnormal eye movements or abnormal alignment of both eyes.  · Weakness in the wrist.  · Foot drop, which results in inability to lift the foot properly. This causes abnormal walking or foot movement.  DIAGNOSIS   Diagnosis is made based on your symptoms and what your caregiver finds on your exam. Other tests that may be done  include:  · Nerve Conduction Velocities (NCV). This checks the transmission of electrical current through a nerve.  · Electromyography (EMG). This shows how muscles respond to electrical signals transmitted by nearby nerves.  · Quantitative sensory testing, which is used to assess how your nerves respond to vibration and changes in temperature.  TREATMENT  Once nerve damage occurs it cannot be reversed. The goal of treatment is to keep the disease from getting worse. If it gets worse, it will affect more nerve fibers. Controlling your blood (sugar) is the key. You will need to keep your blood glucose and A1c at the target range prescribed by your caregiver. Things that will help control blood glucose levels include:  · Blood glucose monitoring.  · Meal planning.  · Physical activity.  · Diabetes medication.  Over time, maintaining lower blood glucose levels helps lessen symptoms.  Sometimes, prescription pain medicine is needed. Focal neuropathy can be painful and unpredictable and occurs most often in older adults with diabetes.   SEEK MEDICAL CARE IF:   · You develop peripheral nerve symptoms such as burning, numbness, or pain in your feet, legs or hands.  · You develop autonomic nerve symptoms such as:  · Dizziness.  · Abnormal urinary control.  · Inability to get an erection.  · You develop focal nerve symptoms such as sudden abnormal eye movements or sudden foot drop.  Document Released: 06/26/2001 Document Revised: 07/10/2011 Document Reviewed: 09/25/2008  [employer]® Patient Information ©2013 [employer].

## 2012-02-13 ENCOUNTER — Other Ambulatory Visit: Payer: Self-pay | Admitting: *Deleted

## 2012-02-13 MED ORDER — GABAPENTIN 400 MG PO CAPS: 800.0000 mg | ORAL_CAPSULE | Freq: Three times a day (TID) | ORAL | Status: DC

## 2012-02-13 NOTE — Progress Notes (Signed)
Your kidney and liver test came back perfect.

## 2012-02-14 ENCOUNTER — Telehealth: Payer: Self-pay

## 2012-02-15 ENCOUNTER — Ambulatory Visit
Admission: RE | Admit: 2012-02-15 | Discharge: 2012-02-15 | Disposition: A | Discharge status: Home / Self Care | Payer: Medicare Other | Source: Ambulatory Visit | Attending: Family Medicine | Admitting: Family Medicine

## 2012-02-15 DIAGNOSIS — M47817 Spondylosis without myelopathy or radiculopathy, lumbosacral region: Secondary | ICD-10-CM | POA: Diagnosis not present

## 2012-02-15 DIAGNOSIS — M48061 Spinal stenosis, lumbar region without neurogenic claudication: Secondary | ICD-10-CM

## 2012-02-15 DIAGNOSIS — M549 Dorsalgia, unspecified: Secondary | ICD-10-CM

## 2012-02-15 DIAGNOSIS — M4310 Spondylolisthesis, site unspecified: Secondary | ICD-10-CM | POA: Diagnosis not present

## 2012-02-15 DIAGNOSIS — G629 Polyneuropathy, unspecified: Secondary | ICD-10-CM

## 2012-02-19 ENCOUNTER — Other Ambulatory Visit: Payer: Self-pay | Admitting: Family Medicine

## 2012-02-19 DIAGNOSIS — M549 Dorsalgia, unspecified: Secondary | ICD-10-CM

## 2012-02-19 DIAGNOSIS — M48061 Spinal stenosis, lumbar region without neurogenic claudication: Secondary | ICD-10-CM

## 2012-02-19 DIAGNOSIS — G629 Polyneuropathy, unspecified: Secondary | ICD-10-CM

## 2012-02-23 ENCOUNTER — Ambulatory Visit: Payer: Medicare Other | Admitting: Family Medicine

## 2012-02-29 DIAGNOSIS — E119 Type 2 diabetes mellitus without complications: Secondary | ICD-10-CM | POA: Diagnosis not present

## 2012-03-01 ENCOUNTER — Encounter: Payer: Self-pay | Admitting: Family Medicine

## 2012-03-14 ENCOUNTER — Telehealth: Payer: Self-pay

## 2012-03-14 DIAGNOSIS — M4807 Spinal stenosis, lumbosacral region: Secondary | ICD-10-CM

## 2012-03-14 NOTE — Telephone Encounter (Signed)
Patient is needing a refill on her percocet for her to pick up   [facility]#: [phone]

## 2012-03-15 NOTE — Telephone Encounter (Signed)
Dr. Clelia Croft, I see that you filled this 02/08/2012, but I'm not sure what your long-term plan is.  Please advise. Reply back to the clinical message pool. Thanks.

## 2012-03-19 DIAGNOSIS — F4310 Post-traumatic stress disorder, unspecified: Secondary | ICD-10-CM | POA: Diagnosis not present

## 2012-03-19 DIAGNOSIS — F319 Bipolar disorder, unspecified: Secondary | ICD-10-CM | POA: Diagnosis not present

## 2012-03-19 MED ORDER — OXYCODONE-ACETAMINOPHEN 5-325 MG PO TABS: 1.0000 | ORAL_TABLET | Freq: Three times a day (TID) | ORAL | Status: DC | PRN

## 2012-03-19 NOTE — Telephone Encounter (Signed)
Referral entered  

## 2012-03-19 NOTE — Telephone Encounter (Signed)
Thanks, instead of doing 2 Rx for her, [name] has signed your order for the Percocet and I have advised patient to pick up the Rx today. She states her insurance does not start until January she will have [facility] She states the Neurosurgeons do not take [facility]. She has seen Dr Shelle Iron at [facility] in the past for her back, he is an orthopedic Writer. She is agreeable to see him (in January) if he participates with [facility]. I can put in referral if you agree. If his office does not take [facility] and [facility] both take [employer], and both of these offices do participate with [employer], and they also both have spine specialists. Please let me know. [name]

## 2012-03-19 NOTE — Telephone Encounter (Signed)
Wonderful! Thanks Lanora Manis.  And thank you Amy for finding out possible referrals - that will work perfectly.  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

## 2012-03-19 NOTE — Telephone Encounter (Signed)
Patient called back to check on status of refill, I explained we are waiting on provider approval.

## 2012-03-19 NOTE — Telephone Encounter (Signed)
Yes, I will refill her percocet 5/325mg  1 tab po q8hrs prn pain. Disp #90, No refills.  Unfortunately, I will not be in the office until Fri to sign a rx so she will be able to pick it on Sat.  To help with her pain until then, please call in norco 5/325 mg 1 tab po q4hrs prn pain, Disp #20, no refills. Has pt's insurance came through yet?  Can we get her neurosurgery referral going?  I am worried that she could have permanent spinal nerve damage if we keep waiting. . . .  Thanks

## 2012-03-23 ENCOUNTER — Ambulatory Visit (INDEPENDENT_AMBULATORY_CARE_PROVIDER_SITE_OTHER): Payer: Medicare Other | Admitting: Family Medicine

## 2012-03-23 VITALS — BP 106/64 | HR 63 | Temp 97.8°F | Resp 16 | Ht 62.0 in | Wt 159.0 lb

## 2012-03-23 DIAGNOSIS — M48061 Spinal stenosis, lumbar region without neurogenic claudication: Secondary | ICD-10-CM | POA: Diagnosis not present

## 2012-03-23 DIAGNOSIS — M549 Dorsalgia, unspecified: Secondary | ICD-10-CM

## 2012-03-23 DIAGNOSIS — G8929 Other chronic pain: Secondary | ICD-10-CM

## 2012-03-23 DIAGNOSIS — J40 Bronchitis, not specified as acute or chronic: Secondary | ICD-10-CM | POA: Diagnosis not present

## 2012-03-23 MED ORDER — BENZONATATE 200 MG PO CAPS: 200.0000 mg | ORAL_CAPSULE | Freq: Three times a day (TID) | ORAL | Status: DC | PRN

## 2012-03-23 MED ORDER — OXYCODONE-ACETAMINOPHEN 5-325 MG PO TABS: 2.0000 | ORAL_TABLET | ORAL | Status: DC | PRN

## 2012-03-23 MED ORDER — AZITHROMYCIN 250 MG PO TABS: ORAL_TABLET | ORAL | Status: DC

## 2012-03-23 NOTE — Progress Notes (Signed)
Subjective:    Patient ID: [name], female    DOB: [birth year], 51 y.o.   MRN: [id number]  HPI  Dr. Casimiro Needle Hilts if dr. Shelle Iron can't see her.  Started feeling ill 3-4d prev with cough and she "wanted to get ahead of it." Last yr she got bronchitis and then was sick all last winter - could never really shake it. Dry cough and tickling itching throat and ears plugged. Some rhinitis and pnd. Temp was slightly over 100 - sweats at baseline w/ menopause.  Sleeping poorly at baseline but worse due to cough. Can't take any otc cough/cold meds other than coricidin products.  Seroquel increased to 300 qhs to try to sleep and going to consider sleep study to look at oxygen o/n.  Severe back pain radiating to left hip - feels like weight is pressing down onto her back and hips.  Has had several falls from left leg going out - numbness and weakness getting worse.  Difficult to walk - is using a cane.  Is considering going to the ER for a shot of dilaudid just so she can be out of pain for 24 hrs.  She had some vicodin left from before we switched her to the percocet and she has had to take a vicodin about 2 hrs after a percocet as it is wearing off - she was previously controlled on tid percocet but she thinks pain is worsening and she is becoming tolerant.  She is going to be referred to orthopedic spine surgery as soon as her [employer] medicare goes through after 05/02/2011. She would like to see Dr. Shelle Iron as she has seen him previously for a Worker's Comp issue but if he won't take her insurance she would be happy to see Dr. Lavada Mesi - he has taken care of her family members prev. Past Medical History  Diagnosis Date  . Back pain, chronic   . Depression   . Myocardial infarction 11/20/06  . Hypertension   . Hypercholesteremia   . Diabetes mellitus   . Coronary artery disease   . Allergy   . Arthritis     Current Outpatient Prescriptions on File Prior to Visit  Medication Sig Dispense  Refill  . aspirin 81 MG tablet Take 81 mg by mouth 2 (two) times daily.       Marland Kitchen BIOTIN PO Take 1 tablet by mouth daily.        . DULoxetine (CYMBALTA) 60 MG capsule Take 60 mg by mouth daily.        . fish oil-omega-3 fatty acids 1000 MG capsule Take 2 g by mouth daily.        Marland Kitchen gabapentin (NEURONTIN) 400 MG capsule Take 2 capsules (800 mg total) by mouth 3 (three) times daily.  180 capsule  2  . lisinopril (PRINIVIL,ZESTRIL) 5 MG tablet Take 1 tablet (5 mg total) by mouth daily.  30 tablet  1  . Melatonin 10 MG CAPS Take 1 tablet by mouth daily.      . metoprolol succinate (TOPROL-XL) 25 MG 24 hr tablet Take 1 tablet (25 mg total) by mouth daily.  90 tablet  3  . omeprazole (PRILOSEC) 40 MG capsule Take 1 capsule (40 mg total) by mouth daily.  30 capsule  3  . oxyCODONE-acetaminophen (ROXICET) 5-325 MG per tablet Take 1 tablet by mouth every 8 (eight) hours as needed for pain.  90 tablet  0  . QUEtiapine (SEROQUEL) 100 MG tablet Take 300  mg by mouth at bedtime. Takes 150 mg at night and then if trouble falling asleep takes 50 mg extended release      . rosuvastatin (CRESTOR) 20 MG tablet Take 1 tablet (20 mg total) by mouth at bedtime.  30 tablet  2  . traMADol (ULTRAM) 50 MG tablet Take 50 mg by mouth 3 (three) times daily. Takes 2 tabs in the am, one tab at noon, and one tab in the pm         Review of Systems  Constitutional: Positive for fever, diaphoresis and fatigue. Negative for chills and appetite change.  HENT: Positive for ear pain, congestion, sore throat, rhinorrhea, voice change and postnasal drip. Negative for hearing loss, nosebleeds, sneezing, trouble swallowing, sinus pressure and ear discharge.   Eyes: Negative for discharge and itching.  Respiratory: Positive for cough. Negative for shortness of breath.   Cardiovascular: Positive for leg swelling.  Musculoskeletal: Positive for myalgias, back pain, arthralgias and gait problem. Negative for joint swelling.  Skin: Positive  for color change.  Neurological: Positive for weakness and numbness.  Hematological: Negative for adenopathy. Bruises/bleeds easily.  Psychiatric/Behavioral: Positive for sleep disturbance.      BP 106/64  Pulse 63  Temp 97.8 F (36.6 C) (Oral)  Resp 16  Ht 5\' 2"  (1.575 m)  Wt 159 lb (72.122 kg)  BMI 29.08 kg/m2  SpO2 92% Objective:   Physical Exam  Constitutional: She is oriented to person, place, and time. She appears well-developed and well-nourished. No distress.  HENT:  Head: Normocephalic and atraumatic.  Right Ear: Tympanic membrane, external ear and ear canal normal.  Left Ear: Tympanic membrane, external ear and ear canal normal.  Nose: Mucosal edema and rhinorrhea present.  Mouth/Throat: Uvula is midline and mucous membranes are normal. No oropharyngeal exudate, posterior oropharyngeal edema, posterior oropharyngeal erythema or tonsillar abscesses.  Eyes: Conjunctivae normal are normal. Right eye exhibits no discharge. Left eye exhibits no discharge. No scleral icterus.  Neck: Normal range of motion. Neck supple.  Cardiovascular: Normal rate, regular rhythm, normal heart sounds and intact distal pulses.   Pulmonary/Chest: Effort normal and breath sounds normal.  Lymphadenopathy:       Head (right side): Submandibular adenopathy present. No preauricular and no posterior auricular adenopathy present.       Head (left side): Submandibular adenopathy present. No preauricular and no posterior auricular adenopathy present.    She has no cervical adenopathy.       Right: No supraclavicular adenopathy present.       Left: No supraclavicular adenopathy present.  Neurological: She is alert and oriented to person, place, and time. Gait abnormal.  Skin: Skin is warm and dry. She is not diaphoretic. No erythema.  Psychiatric: She has a normal mood and affect. Her behavior is normal.      Assessment & Plan:   1. Bronchitis  azithromycin (ZITHROMAX) 250 MG tablet, benzonatate  (TESSALON) 200 MG capsule  2. Back pain, chronic  oxyCODONE-acetaminophen (ROXICET) 5-325 MG per tablet  3. Spinal stenosis of lumbar region  oxyCODONE-acetaminophen (ROXICET) 5-325 MG per tablet.  Ok to increase oxycodone - changed sig from 1 tab q8hrs prn to 1-2 tabs q4hrs prn. Pt received rx for #90 tabs earlier this wk - about 4d prev - written on my behalf by [place], [place].  I gave pt an additional rx for #150 tabs today with the new sig. Pt will call when she runs out to let me know how her pain control is going.  Referral to Dr. Shelle Iron was placed by Amy on my behalf earlier this wk.

## 2012-03-25 ENCOUNTER — Telehealth: Payer: Self-pay

## 2012-03-25 NOTE — Telephone Encounter (Signed)
MRI printed and ready for pick up at front desk. Patient notified.

## 2012-03-25 NOTE — Telephone Encounter (Signed)
Patient states that she needs a copy of the MRI Results for her to pick up.  Please call patient when records are ready for pick-up.    [facility]#: [phone]

## 2012-04-04 ENCOUNTER — Other Ambulatory Visit: Payer: Self-pay | Admitting: *Deleted

## 2012-04-04 MED ORDER — LISINOPRIL 5 MG PO TABS: 5.0000 mg | ORAL_TABLET | Freq: Every day | ORAL | Status: DC

## 2012-04-05 NOTE — Telephone Encounter (Signed)
none

## 2012-05-06 ENCOUNTER — Telehealth: Payer: Self-pay

## 2012-05-06 MED ORDER — LISINOPRIL 5 MG PO TABS: 5.0000 mg | ORAL_TABLET | Freq: Every day | ORAL | Status: DC

## 2012-05-06 NOTE — Telephone Encounter (Signed)
Sent this in

## 2012-05-06 NOTE — Telephone Encounter (Signed)
[name] from [facility] states that they are unable to e prescribe medications at this time, this pt needs a refill on lisinopril 5 mg 1 daily. [facility]# [phone] [name] was the pharmacist that called for this request)

## 2012-05-07 ENCOUNTER — Telehealth: Payer: Self-pay

## 2012-05-07 NOTE — Telephone Encounter (Signed)
PT IN NEED OF HER OXYCODONE. PLEASE CALL [phone] WHEN READY FOR P/U

## 2012-05-08 ENCOUNTER — Other Ambulatory Visit: Payer: Self-pay | Admitting: Family Medicine

## 2012-05-08 DIAGNOSIS — M48061 Spinal stenosis, lumbar region without neurogenic claudication: Secondary | ICD-10-CM

## 2012-05-08 DIAGNOSIS — M549 Dorsalgia, unspecified: Secondary | ICD-10-CM

## 2012-05-08 DIAGNOSIS — G8929 Other chronic pain: Secondary | ICD-10-CM

## 2012-05-08 MED ORDER — OXYCODONE-ACETAMINOPHEN 5-325 MG PO TABS: 2.0000 | ORAL_TABLET | ORAL | Status: DC | PRN

## 2012-05-08 NOTE — Telephone Encounter (Signed)
rx printed and ready for pick up

## 2012-05-09 NOTE — Telephone Encounter (Signed)
Tried to call pt on # she left but VM not set up. LMOM for pt on H/cell # on record w/message Rx is ready for p/up.

## 2012-05-17 DIAGNOSIS — F319 Bipolar disorder, unspecified: Secondary | ICD-10-CM | POA: Diagnosis not present

## 2012-05-17 DIAGNOSIS — F4310 Post-traumatic stress disorder, unspecified: Secondary | ICD-10-CM | POA: Diagnosis not present

## 2012-05-19 ENCOUNTER — Telehealth: Payer: Self-pay | Admitting: Radiology

## 2012-05-19 NOTE — Telephone Encounter (Signed)
Atoravastatin 40 mg requested from [facility]. Please advise, looks like Crestor was given most recently please advise.

## 2012-05-20 ENCOUNTER — Other Ambulatory Visit: Payer: Self-pay | Admitting: Radiology

## 2012-05-20 MED ORDER — ROSUVASTATIN CALCIUM 20 MG PO TABS: 20.0000 mg | ORAL_TABLET | Freq: Every day | ORAL | Status: DC

## 2012-05-20 NOTE — Telephone Encounter (Signed)
We have received paper fax requesting the same. Please call patient to verify that they are indeed taking what we have on file, the Crestor.

## 2012-05-20 NOTE — Telephone Encounter (Signed)
She has not taken the Crestor, but wants to try. Advised this is sent for her, if it is too costly or she has any problems she will call me back.

## 2012-06-07 ENCOUNTER — Telehealth: Payer: Self-pay

## 2012-06-07 MED ORDER — TRAMADOL HCL 50 MG PO TABS: 50.0000 mg | ORAL_TABLET | Freq: Three times a day (TID) | ORAL | Status: DC

## 2012-06-07 NOTE — Telephone Encounter (Signed)
Yes, fine to refill x 2

## 2012-06-07 NOTE — Telephone Encounter (Signed)
Pharmacist called and stated that she had faxed over a request for RF and we had sent back asking for electronic request. They are unable to send electronically. Pt is requesting RF of Tramadol 50 mg 1 -2 tabs Q6 hrs prn pain #120. Pt last filled on 04/02/12. Dr Clelia Croft, do you want to RF this? Needs to be sent to [facility].

## 2012-06-07 NOTE — Telephone Encounter (Signed)
Sent this in for her. 

## 2012-06-10 ENCOUNTER — Telehealth: Payer: Self-pay | Admitting: Radiology

## 2012-06-10 ENCOUNTER — Other Ambulatory Visit: Payer: Self-pay | Admitting: Family Medicine

## 2012-06-10 DIAGNOSIS — M48061 Spinal stenosis, lumbar region without neurogenic claudication: Secondary | ICD-10-CM

## 2012-06-10 DIAGNOSIS — M549 Dorsalgia, unspecified: Secondary | ICD-10-CM

## 2012-06-10 DIAGNOSIS — G8929 Other chronic pain: Secondary | ICD-10-CM

## 2012-06-10 MED ORDER — OXYCODONE-ACETAMINOPHEN 5-325 MG PO TABS: 2.0000 | ORAL_TABLET | ORAL | Status: DC | PRN

## 2012-06-10 NOTE — Telephone Encounter (Signed)
Printed - hold for my sig later today please.  Also, please remind pt that she is due for an OV when she next needs a refill.  And has her new insurance came through yet so she can see a neurosurgeon before she develops irreversible weakness and numbness?

## 2012-06-10 NOTE — Telephone Encounter (Signed)
Please advise, patient is asking for Oxycodone renewal, please advise [phone]

## 2012-06-10 NOTE — Telephone Encounter (Signed)
Called patient to advise Rx ready for pick up, she has appt with you this Friday.

## 2012-06-14 ENCOUNTER — Ambulatory Visit: Payer: Medicare Other | Admitting: Family Medicine

## 2012-06-21 ENCOUNTER — Encounter: Payer: Self-pay | Admitting: Family Medicine

## 2012-06-21 ENCOUNTER — Ambulatory Visit (INDEPENDENT_AMBULATORY_CARE_PROVIDER_SITE_OTHER): Payer: Medicare Other | Admitting: Family Medicine

## 2012-06-21 VITALS — BP 106/56 | HR 75 | Temp 98.5°F | Resp 16 | Ht 62.0 in | Wt 156.4 lb

## 2012-06-21 DIAGNOSIS — M48061 Spinal stenosis, lumbar region without neurogenic claudication: Secondary | ICD-10-CM

## 2012-06-21 DIAGNOSIS — L659 Nonscarring hair loss, unspecified: Secondary | ICD-10-CM | POA: Diagnosis not present

## 2012-06-21 DIAGNOSIS — M549 Dorsalgia, unspecified: Secondary | ICD-10-CM

## 2012-06-21 DIAGNOSIS — Z23 Encounter for immunization: Secondary | ICD-10-CM | POA: Diagnosis not present

## 2012-06-21 LAB — CBC WITH DIFFERENTIAL/PLATELET
Hemoglobin: 16.1 g/dL — ABNORMAL HIGH (ref 12.0–15.0)
Lymphocytes Relative: 46 % (ref 12–46)
Lymphs Abs: 3.6 10*3/uL (ref 0.7–4.0)
Monocytes Relative: 9 % (ref 3–12)
Neutro Abs: 3.4 10*3/uL (ref 1.7–7.7)
Neutrophils Relative %: 42 % — ABNORMAL LOW (ref 43–77)
RBC: 5.45 MIL/uL — ABNORMAL HIGH (ref 3.87–5.11)
WBC: 7.9 10*3/uL (ref 4.0–10.5)

## 2012-06-21 LAB — TSH: TSH: 0.703 u[IU]/mL (ref 0.350–4.500)

## 2012-06-21 MED ORDER — RANITIDINE HCL 150 MG PO TABS: 150.0000 mg | ORAL_TABLET | Freq: Two times a day (BID) | ORAL | Status: DC

## 2012-06-21 MED ORDER — TRAMADOL HCL 50 MG PO TABS: 50.0000 mg | ORAL_TABLET | Freq: Three times a day (TID) | ORAL | Status: DC

## 2012-06-21 MED ORDER — OXYCODONE-ACETAMINOPHEN 5-325 MG PO TABS: 2.0000 | ORAL_TABLET | ORAL | Status: DC | PRN

## 2012-06-21 MED ORDER — CARVEDILOL 3.125 MG PO TABS: 3.1250 mg | ORAL_TABLET | Freq: Two times a day (BID) | ORAL | Status: DC

## 2012-06-21 MED ORDER — ROSUVASTATIN CALCIUM 20 MG PO TABS: 20.0000 mg | ORAL_TABLET | Freq: Every day | ORAL | Status: DC

## 2012-06-21 NOTE — Progress Notes (Signed)
Subjective:    Patient ID: [name], female    DOB: 06-Jun-1960, 52 y.o.   MRN: [id number] Chief Complaint  Patient presents with  . Medication Refill    HPI  Going to try to loose weight.  loosing her hair.  Has a cane. Has had a few falls - been able to catch self and often holds her furniture when walking through her house. She can tell when she doesn't take her tramadol.  No sharp pain going down hip.  Past Medical History  Diagnosis Date  . Back pain, chronic   . Depression   . Myocardial infarction 11/20/06  . Hypertension   . Hypercholesteremia   . Diabetes mellitus   . Coronary artery disease   . Allergy   . Arthritis    Current Outpatient Prescriptions on File Prior to Visit  Medication Sig Dispense Refill  . aspirin 81 MG tablet Take 81 mg by mouth 2 (two) times daily.       . DULoxetine (CYMBALTA) 60 MG capsule Take 60 mg by mouth daily.        . fish oil-omega-3 fatty acids 1000 MG capsule Take 2 g by mouth daily.        Marland Kitchen gabapentin (NEURONTIN) 400 MG capsule Take 2 capsules (800 mg total) by mouth 3 (three) times daily.  180 capsule  2  . lisinopril (PRINIVIL,ZESTRIL) 5 MG tablet Take 1 tablet (5 mg total) by mouth daily.  30 tablet  2  . metoprolol succinate (TOPROL-XL) 25 MG 24 hr tablet Take 1 tablet (25 mg total) by mouth daily.  90 tablet  3  . omeprazole (PRILOSEC) 40 MG capsule Take 1 capsule (40 mg total) by mouth daily.  30 capsule  3  . oxyCODONE-acetaminophen (ROXICET) 5-325 MG per tablet Take 2 tablets by mouth every 4 (four) hours as needed for pain.  150 tablet  0  . QUEtiapine (SEROQUEL) 100 MG tablet Take 300 mg by mouth at bedtime. Takes 150 mg at night and then if trouble falling asleep takes 50 mg extended release      . rosuvastatin (CRESTOR) 20 MG tablet Take 1 tablet (20 mg total) by mouth at bedtime.  90 tablet  0  . traMADol (ULTRAM) 50 MG tablet Take 1 tablet (50 mg total) by mouth 3 (three) times daily.  30 tablet  1  .  azithromycin (ZITHROMAX) 250 MG tablet Take 2 tabs PO x 1 dose, then 1 tab PO QD x 4 days  6 tablet  0  . benzonatate (TESSALON) 200 MG capsule Take 1 capsule (200 mg total) by mouth 3 (three) times daily as needed for cough.  30 capsule  0  . BIOTIN PO Take 1 tablet by mouth daily.        . Melatonin 10 MG CAPS Take 1 tablet by mouth daily.       No current facility-administered medications on file prior to visit.   History   Social History  . Marital Status: Married    Spouse Name: N/A    Number of Children: N/A  . Years of Education: N/A   Social History Main Topics  . Smoking status: Current Every Day Smoker  . Smokeless tobacco: Never Used  . Alcohol Use: No  . Drug Use: None  . Sexually Active: None   Other Topics Concern  . None   Social History Narrative  . None     Review of Systems  Constitutional: Positive for  fatigue. Negative for fever, chills, diaphoresis and appetite change.  HENT: Positive for dental problem.   Eyes: Negative for visual disturbance.  Respiratory: Negative for cough, chest tightness and shortness of breath.   Cardiovascular: Positive for leg swelling. Negative for chest pain and palpitations.  Endocrine:       Loosing hair  Genitourinary: Negative for decreased urine volume.  Musculoskeletal: Positive for myalgias, back pain, joint swelling, arthralgias and gait problem.  Skin: Positive for color change. Negative for rash.  Neurological: Negative for syncope and headaches.  Hematological: Negative for adenopathy. Bruises/bleeds easily.  Psychiatric/Behavioral: Negative for self-injury and dysphoric mood.      BP 106/56  Pulse 75  Temp(Src) 98.5 F (36.9 C) (Oral)  Resp 16  Ht 5\' 2"  (1.575 m)  Wt 156 lb 6.4 oz (70.943 kg)  BMI 28.6 kg/m2  SpO2 93% Objective:   Physical Exam  Constitutional: She is oriented to person, place, and time. She appears well-developed and well-nourished. No distress.  HENT:  Head: Normocephalic and  atraumatic.  Right Ear: External ear normal.  Left Ear: External ear normal.  Eyes: Conjunctivae are normal. No scleral icterus.  Neck: Normal range of motion. Neck supple. No thyromegaly present.  Cardiovascular: Normal rate, regular rhythm, normal heart sounds and intact distal pulses.   Pulmonary/Chest: Effort normal and breath sounds normal. No respiratory distress.  Musculoskeletal: She exhibits no edema.  Lymphadenopathy:    She has no cervical adenopathy.  Neurological: She is alert and oriented to person, place, and time. She displays atrophy and abnormal reflex. She exhibits abnormal muscle tone. Gait abnormal.  Skin: Skin is warm and dry. She is not diaphoretic. No erythema.  Psychiatric: She has a normal mood and affect. Her behavior is normal.      Assessment & Plan:  will have made an appt w/ dentist by 3 mo f/u.  Can go to [facility] for prophylactic vaccination and inoculation against influenza - Plan: Flu vaccine greater than or equal to 3yo preservative free IM  Back pain, chronic due to Spinal stenosis of lumbar region - Plan: TSH, CBC with Differential, DISCONTINUED: oxyCODONE-acetaminophen (ROXICET) 5-325 MG per tablet, DISCONTINUED: oxyCODONE-acetaminophen (ROXICET) 5-325 MG per tablet, DISCONTINUED: oxyCODONE-acetaminophen (ROXICET) 5-325 MG per tablet.  Pt just got insurance and has been delaying f/u with neurosurgery - Dr. Shelle Iron - as she is worried about spinal surgery though know she needs it before she develops more weakness in her legs that could be irreversible. Pt will have made appt w/ Dr. Shelle Iron to review MRI and weakness/numbness/poor gait before our next appt.  Pt is excited that under her new health insurance, they will help her get memberships to [employer], [employer]. I encouraged pt to join [employer] so she can do water therapy/aerobics.  Hair loss - Plan: TSH, CBC with Differential  HPL - not fasting today - Needs lipid panel at next visit in 3 mos.    Meds ordered  this encounter  Medications  . traMADol (ULTRAM) 50 MG tablet    Sig: Take 1 tablet (50 mg total) by mouth 3 (three) times daily.    Dispense:  90 tablet    Refill:  3  . ranitidine (ZANTAC) 150 MG tablet    Sig: Take 1 tablet (150 mg total) by mouth 2 (two) times daily.    Dispense:  60 tablet    Refill:  0  . carvedilol (COREG) 3.125 MG tablet    Sig: Take 1 tablet (3.125 mg total) by mouth  2 (two) times daily with a meal.    Dispense:  60 tablet    Refill:  3  . rosuvastatin (CRESTOR) 20 MG tablet    Sig: Take 1 tablet (20 mg total) by mouth at bedtime.    Dispense:  90 tablet    Refill:  0  . DISCONTD: oxyCODONE-acetaminophen (ROXICET) 5-325 MG per tablet    Sig: Take 2 tablets by mouth every 4 (four) hours as needed for pain. May fill on or after 07/08/12.    Dispense:  150 tablet    Refill:  0  . DISCONTD: oxyCODONE-acetaminophen (ROXICET) 5-325 MG per tablet    Sig: Take 2 tablets by mouth every 4 (four) hours as needed for pain. May fill on or after 08/08/12.    Dispense:  150 tablet    Refill:  0  . DISCONTD: oxyCODONE-acetaminophen (ROXICET) 5-325 MG per tablet    Sig: Take 2 tablets by mouth every 4 (four) hours as needed for pain. May fill on or after 09/07/12.    Dispense:  150 tablet    Refill:  0

## 2012-07-08 ENCOUNTER — Emergency Department (HOSPITAL_COMMUNITY): Payer: Medicare HMO

## 2012-07-08 ENCOUNTER — Encounter (HOSPITAL_COMMUNITY): Payer: Self-pay | Admitting: *Deleted

## 2012-07-08 ENCOUNTER — Emergency Department (HOSPITAL_COMMUNITY)
Admission: EM | Admit: 2012-07-08 | Discharge: 2012-07-08 | Disposition: A | Discharge status: Home / Self Care | Payer: Medicare HMO | Attending: Emergency Medicine | Admitting: Emergency Medicine

## 2012-07-08 DIAGNOSIS — S82843A Displaced bimalleolar fracture of unspecified lower leg, initial encounter for closed fracture: Secondary | ICD-10-CM | POA: Diagnosis not present

## 2012-07-08 DIAGNOSIS — S82891A Other fracture of right lower leg, initial encounter for closed fracture: Secondary | ICD-10-CM

## 2012-07-08 DIAGNOSIS — I10 Essential (primary) hypertension: Secondary | ICD-10-CM | POA: Insufficient documentation

## 2012-07-08 DIAGNOSIS — F3289 Other specified depressive episodes: Secondary | ICD-10-CM | POA: Insufficient documentation

## 2012-07-08 DIAGNOSIS — E119 Type 2 diabetes mellitus without complications: Secondary | ICD-10-CM | POA: Insufficient documentation

## 2012-07-08 DIAGNOSIS — I2510 Atherosclerotic heart disease of native coronary artery without angina pectoris: Secondary | ICD-10-CM | POA: Insufficient documentation

## 2012-07-08 DIAGNOSIS — Z8674 Personal history of sudden cardiac arrest: Secondary | ICD-10-CM | POA: Insufficient documentation

## 2012-07-08 DIAGNOSIS — I252 Old myocardial infarction: Secondary | ICD-10-CM | POA: Insufficient documentation

## 2012-07-08 DIAGNOSIS — S8253XA Displaced fracture of medial malleolus of unspecified tibia, initial encounter for closed fracture: Secondary | ICD-10-CM | POA: Insufficient documentation

## 2012-07-08 DIAGNOSIS — Z951 Presence of aortocoronary bypass graft: Secondary | ICD-10-CM | POA: Insufficient documentation

## 2012-07-08 DIAGNOSIS — G8929 Other chronic pain: Secondary | ICD-10-CM | POA: Insufficient documentation

## 2012-07-08 DIAGNOSIS — Y92009 Unspecified place in unspecified non-institutional (private) residence as the place of occurrence of the external cause: Secondary | ICD-10-CM | POA: Insufficient documentation

## 2012-07-08 DIAGNOSIS — F17200 Nicotine dependence, unspecified, uncomplicated: Secondary | ICD-10-CM | POA: Insufficient documentation

## 2012-07-08 DIAGNOSIS — F329 Major depressive disorder, single episode, unspecified: Secondary | ICD-10-CM | POA: Insufficient documentation

## 2012-07-08 DIAGNOSIS — Y9389 Activity, other specified: Secondary | ICD-10-CM | POA: Insufficient documentation

## 2012-07-08 DIAGNOSIS — Z7982 Long term (current) use of aspirin: Secondary | ICD-10-CM | POA: Insufficient documentation

## 2012-07-08 DIAGNOSIS — W1789XA Other fall from one level to another, initial encounter: Secondary | ICD-10-CM | POA: Insufficient documentation

## 2012-07-08 DIAGNOSIS — M549 Dorsalgia, unspecified: Secondary | ICD-10-CM | POA: Insufficient documentation

## 2012-07-08 DIAGNOSIS — Z8669 Personal history of other diseases of the nervous system and sense organs: Secondary | ICD-10-CM | POA: Insufficient documentation

## 2012-07-08 DIAGNOSIS — E7800 Pure hypercholesterolemia, unspecified: Secondary | ICD-10-CM | POA: Insufficient documentation

## 2012-07-08 DIAGNOSIS — Z8739 Personal history of other diseases of the musculoskeletal system and connective tissue: Secondary | ICD-10-CM | POA: Insufficient documentation

## 2012-07-08 DIAGNOSIS — Z79899 Other long term (current) drug therapy: Secondary | ICD-10-CM | POA: Insufficient documentation

## 2012-07-08 NOTE — Progress Notes (Signed)
Orthopedic Tech Progress Note Patient Details:  [name] [facility] 06/17/1960 [id number] ASO applied to Right LE; crutches fitted for patient height and comfort. Double checked with attending ER nurse to make sure ankle ASO brace was all doctor wanted for patient because of xray findings. Nurse checked again with doctor who stated that yes, ankle ASO was all patient would need at this time. Confirmed with nurse and patient set for discharge.  Ortho Devices Type of Ortho Device: ASO;Crutches Ortho Device/Splint Interventions: Application   Asia R Thompson 07/08/2012, 11:35 AM

## 2012-07-08 NOTE — ED Notes (Signed)
Per EMS- pt was walking when she slipped on her porch. Pt noted to have bruising and swelling to rt ankle. Pt received 200 mcg of fentanyl en route. No LOC.

## 2012-07-08 NOTE — ED Notes (Signed)
Ortho paged. 

## 2012-07-08 NOTE — ED Provider Notes (Signed)
I saw and evaluated the patient, reviewed the resident's note and I agree with the findings and plan.  Pt with ankle injury.  Swelling noted lateral malleolus with ttp.  No 5th MT ttp or proximal fibula ttp.  X-rays ordered.  Results show bimaleolar fracture.  Will place in splint, crutches, ice.  Follow up with ortho.  Celene Kras, MD 07/08/12 [phone]

## 2012-07-08 NOTE — ED Provider Notes (Addendum)
History     CSN: [id number]  Arrival date & time 07/08/12  4098   First MD Initiated Contact with Patient 07/08/12 [phone]      Chief Complaint  Patient presents with  . Ankle Injury   Patient is a 52 y.o. female presenting with lower extremity injury.  Ankle Injury Associated symptoms include joint swelling. Pertinent negatives include no chest pain, headaches, numbness or weakness.   52 y/o F with significant cardiac Hx and chronic back pain that is brought to ED by EMS secondary to a fall injuring her right ankle. Pt describes the event as she was at her home standing on her deck and stepped on the very edge losing balance and falling on her right ankle. This happens around 8:00 am, a neighbor witness the fall, pick pt from floor and place her inside pt's car. Since she could not drive, pt activate EMS who gave her 200 mg Fentanyl for pain and brought her here. Pt denies any other area of injury or pain besides her chronic back pain. She also denies any symptoms of lightheadedness, dizziness, numbness or weakness prior or after the event.  Pain is controlled at this time.  Past Medical History  Diagnosis Date  . Back pain, chronic   . Depression   . Myocardial infarction 11/20/06  . Hypertension   . Hypercholesteremia   . Diabetes mellitus   . Coronary artery disease   . Allergy   . Arthritis   . Neuromuscular disorder   . Heart attack     Past Surgical History  Procedure Laterality Date  . Mandible surgery    . Coronary artery bypass graft    . Knee surgery    . Abdominal hysterectomy    . Tubal ligation      Family History  Problem Relation Age of Onset  . Heart disease Mother   . Cancer Mother   . Heart disease Father   . Stroke Father   . Hypertension Sister   . Hypertension Brother   . Cancer Daughter   . Hypertension Maternal Grandmother   . Hypertension Maternal Grandfather   . Cancer Maternal Grandfather   . Hypertension Paternal Grandmother   .  Hypertension Paternal Grandfather     History  Substance Use Topics  . Smoking status: Current Every Day Smoker  . Smokeless tobacco: Never Used  . Alcohol Use: No    Review of Systems  HENT: Negative.   Respiratory: Negative.   Cardiovascular: Negative for chest pain and palpitations.  Gastrointestinal: Negative.   Genitourinary: Negative.   Musculoskeletal: Positive for back pain and joint swelling.  Skin: Positive for color change.  Neurological: Negative for dizziness, syncope, weakness, light-headedness, numbness and headaches.    Allergies  Ibuprofen; Prednisone; Morphine; and Coconut oil  Home Medications   Current Outpatient Rx  Name  Route  Sig  Dispense  Refill  . aspirin 81 MG tablet   Oral   Take 81 mg by mouth 2 (two) times daily.          Marland Kitchen BIOTIN PO   Oral   Take 1 tablet by mouth daily.           . carvedilol (COREG) 3.125 MG tablet   Oral   Take 1 tablet (3.125 mg total) by mouth 2 (two) times daily with a meal.   60 tablet   3   . DULoxetine (CYMBALTA) 60 MG capsule   Oral   Take 60 mg by mouth  daily.           . fish oil-omega-3 fatty acids 1000 MG capsule   Oral   Take 2 g by mouth daily.           Marland Kitchen gabapentin (NEURONTIN) 400 MG capsule   Oral   Take 2 capsules (800 mg total) by mouth 3 (three) times daily.   180 capsule   2   . lisinopril (PRINIVIL,ZESTRIL) 5 MG tablet   Oral   Take 1 tablet (5 mg total) by mouth daily.   30 tablet   2   . Melatonin 10 MG CAPS   Oral   Take 1 tablet by mouth daily.         Marland Kitchen omeprazole (PRILOSEC) 40 MG capsule   Oral   Take 1 capsule (40 mg total) by mouth daily.   30 capsule   3   . oxyCODONE-acetaminophen (ROXICET) 5-325 MG per tablet   Oral   Take 2 tablets by mouth every 4 (four) hours as needed for pain. May fill on or after 09/07/12.   150 tablet   0   . QUEtiapine (SEROQUEL) 100 MG tablet   Oral   Take 300 mg by mouth at bedtime. Takes 150 mg at night and then if  trouble falling asleep takes 50 mg extended release         . ranitidine (ZANTAC) 150 MG tablet   Oral   Take 1 tablet (150 mg total) by mouth 2 (two) times daily.   60 tablet   0   . rosuvastatin (CRESTOR) 20 MG tablet   Oral   Take 1 tablet (20 mg total) by mouth at bedtime.   90 tablet   0   . traMADol (ULTRAM) 50 MG tablet   Oral   Take 1 tablet (50 mg total) by mouth 3 (three) times daily.   90 tablet   3     BP 132/81  Pulse 60  Temp(Src) 98 F (36.7 C) (Oral)  Resp 14  SpO2 94%  Physical Exam Gen:  NAD HEENT: Moist mucous membranes  CV: Regular rate and rhythm, no murmurs rubs or gallops PULM: Clear to auscultation bilaterally. No wheezes/rales/rhonchi ABD: Soft, non tender, non distended, normal bowel sounds EXT: Edema and ecchymosis on right ankle. Tenderness on posterior edge of lateral malleoli.  Skin: Nevi along upper portion of right leg. Small excoriation about 3 cm on frontal aspect of right ankle.     Neuro: Alert and oriented x3. No focalization   ED Course  Procedures (including critical care time)  Labs Reviewed - No data to display Dg Ankle Complete Right  07/08/2012  *RADIOLOGY REPORT*  Clinical Data: Ankle pain and swelling after injury.  RIGHT ANKLE - COMPLETE 3+ VIEW  Comparison: None.  Findings: Moderately displaced oblique fracture of distal fibula is noted with overlying soft tissue swelling.  Mildly displaced fracture of medial malleolus is noted as well.  IMPRESSION: Mildly displaced medial malleolar fracture.  Moderately displaced oblique distal fibular fracture.   Original Report Authenticated By: Lupita Raider.,  M.D.     1. Ankle fracture, right, closed, initial encounter     MDM  Complete right ankle XR showed Displaced Medial Malleolar Fracture and Moderately Displaced Oblique Distal Fibular Fracture>> ankle splint and crutches. Evaluation by Orthopedic as outpatient   Dayarmys Piloto de Criselda Peaches, MD 07/08/12 1012  Dayarmys  Piloto de Criselda Peaches, MD 07/08/12 1155

## 2012-07-09 NOTE — ED Provider Notes (Signed)
Medical screening examination/treatment/procedure(s) were performed by non-physician practitioner and as supervising physician I was immediately available for consultation/collaboration.   Celene Kras, MD 07/09/12 [phone]

## 2012-07-11 ENCOUNTER — Encounter (HOSPITAL_BASED_OUTPATIENT_CLINIC_OR_DEPARTMENT_OTHER)
Admission: RE | Admit: 2012-07-11 | Discharge: 2012-07-11 | Disposition: A | Discharge status: Home / Self Care | Payer: Medicare HMO | Source: Ambulatory Visit | Attending: Orthopedic Surgery | Admitting: Orthopedic Surgery

## 2012-07-11 ENCOUNTER — Other Ambulatory Visit: Payer: Self-pay

## 2012-07-11 ENCOUNTER — Other Ambulatory Visit: Payer: Self-pay | Admitting: Orthopedic Surgery

## 2012-07-11 ENCOUNTER — Encounter (HOSPITAL_BASED_OUTPATIENT_CLINIC_OR_DEPARTMENT_OTHER): Payer: Self-pay | Admitting: *Deleted

## 2012-07-11 LAB — BASIC METABOLIC PANEL
BUN: 15 mg/dL (ref 6–23)
CO2: 22 mEq/L (ref 19–32)
Calcium: 9.2 mg/dL (ref 8.4–10.5)
GFR calc non Af Amer: >90 mL/min (ref >90)
Glucose, Bld: 162 mg/dL — ABNORMAL HIGH (ref 70–99)

## 2012-07-11 NOTE — Progress Notes (Signed)
Pt came in for ekg and bmet-reviewed all notes with dr Jamison Oka talked with pt-will not need to see dr Tedra Senegal prior to surgery

## 2012-07-11 NOTE — Progress Notes (Signed)
Patient's previous ekg and ekg from today reviewed by Dr. Sampson Goon. Ok for surgery tomorrow.

## 2012-07-12 ENCOUNTER — Encounter (HOSPITAL_BASED_OUTPATIENT_CLINIC_OR_DEPARTMENT_OTHER): Payer: Self-pay | Admitting: Anesthesiology

## 2012-07-12 ENCOUNTER — Ambulatory Visit (HOSPITAL_BASED_OUTPATIENT_CLINIC_OR_DEPARTMENT_OTHER)
Admission: RE | Admit: 2012-07-12 | Discharge: 2012-07-12 | Disposition: A | Discharge status: Home / Self Care | Payer: Medicare HMO | Source: Ambulatory Visit | Attending: Orthopedic Surgery | Admitting: Orthopedic Surgery

## 2012-07-12 ENCOUNTER — Encounter (HOSPITAL_BASED_OUTPATIENT_CLINIC_OR_DEPARTMENT_OTHER)
Admission: RE | Disposition: A | Discharge status: Home / Self Care | Payer: Self-pay | Source: Ambulatory Visit | Attending: Orthopedic Surgery

## 2012-07-12 ENCOUNTER — Ambulatory Visit (HOSPITAL_BASED_OUTPATIENT_CLINIC_OR_DEPARTMENT_OTHER): Payer: Medicare HMO | Admitting: Anesthesiology

## 2012-07-12 ENCOUNTER — Encounter (HOSPITAL_BASED_OUTPATIENT_CLINIC_OR_DEPARTMENT_OTHER): Payer: Self-pay | Admitting: Certified Registered"

## 2012-07-12 DIAGNOSIS — I2510 Atherosclerotic heart disease of native coronary artery without angina pectoris: Secondary | ICD-10-CM | POA: Insufficient documentation

## 2012-07-12 DIAGNOSIS — E7800 Pure hypercholesterolemia, unspecified: Secondary | ICD-10-CM | POA: Insufficient documentation

## 2012-07-12 DIAGNOSIS — Z951 Presence of aortocoronary bypass graft: Secondary | ICD-10-CM | POA: Insufficient documentation

## 2012-07-12 DIAGNOSIS — Z01812 Encounter for preprocedural laboratory examination: Secondary | ICD-10-CM | POA: Insufficient documentation

## 2012-07-12 DIAGNOSIS — I252 Old myocardial infarction: Secondary | ICD-10-CM | POA: Insufficient documentation

## 2012-07-12 DIAGNOSIS — F17200 Nicotine dependence, unspecified, uncomplicated: Secondary | ICD-10-CM | POA: Insufficient documentation

## 2012-07-12 DIAGNOSIS — F3289 Other specified depressive episodes: Secondary | ICD-10-CM | POA: Insufficient documentation

## 2012-07-12 DIAGNOSIS — E119 Type 2 diabetes mellitus without complications: Secondary | ICD-10-CM | POA: Insufficient documentation

## 2012-07-12 DIAGNOSIS — Z01810 Encounter for preprocedural cardiovascular examination: Secondary | ICD-10-CM | POA: Insufficient documentation

## 2012-07-12 DIAGNOSIS — S82853A Displaced trimalleolar fracture of unspecified lower leg, initial encounter for closed fracture: Secondary | ICD-10-CM | POA: Insufficient documentation

## 2012-07-12 DIAGNOSIS — F329 Major depressive disorder, single episode, unspecified: Secondary | ICD-10-CM | POA: Insufficient documentation

## 2012-07-12 DIAGNOSIS — X500XXA Overexertion from strenuous movement or load, initial encounter: Secondary | ICD-10-CM | POA: Insufficient documentation

## 2012-07-12 DIAGNOSIS — M129 Arthropathy, unspecified: Secondary | ICD-10-CM | POA: Insufficient documentation

## 2012-07-12 DIAGNOSIS — I10 Essential (primary) hypertension: Secondary | ICD-10-CM | POA: Insufficient documentation

## 2012-07-12 HISTORY — PX: ORIF ANKLE FRACTURE: SHX5408

## 2012-07-12 HISTORY — DX: Other intervertebral disc degeneration, lumbar region without mention of lumbar back pain or lower extremity pain: M51.369

## 2012-07-12 HISTORY — DX: Other intervertebral disc degeneration, lumbar region: M51.36

## 2012-07-12 HISTORY — DX: Bronchitis, not specified as acute or chronic: J40

## 2012-07-12 LAB — GLUCOSE, CAPILLARY
Glucose-Capillary: 124 mg/dL — ABNORMAL HIGH (ref 70–99)
Glucose-Capillary: 145 mg/dL — ABNORMAL HIGH (ref 70–99)

## 2012-07-12 LAB — POCT HEMOGLOBIN-HEMACUE: Hemoglobin: 15.6 g/dL — ABNORMAL HIGH (ref 12.0–15.0)

## 2012-07-12 SURGERY — OPEN REDUCTION INTERNAL FIXATION (ORIF) ANKLE FRACTURE
Anesthesia: General | Site: Ankle | Laterality: Right | Wound class: Clean

## 2012-07-12 MED ORDER — EPHEDRINE SULFATE 50 MG/ML IJ SOLN
INTRAMUSCULAR | Status: DC | PRN
  Administered 2012-07-12: 5 mg via INTRAVENOUS
  Administered 2012-07-12: 10 mg via INTRAVENOUS

## 2012-07-12 MED ORDER — MIDAZOLAM HCL 5 MG/5ML IJ SOLN
INTRAMUSCULAR | Status: DC | PRN
  Administered 2012-07-12: 2 mg via INTRAVENOUS

## 2012-07-12 MED ORDER — FENTANYL CITRATE 0.05 MG/ML IJ SOLN: 50.0000 ug | INTRAMUSCULAR | Status: DC | PRN

## 2012-07-12 MED ORDER — KETOROLAC TROMETHAMINE 30 MG/ML IJ SOLN
INTRAMUSCULAR | Status: DC | PRN
  Administered 2012-07-12: 30 mg via INTRAVENOUS

## 2012-07-12 MED ORDER — CEFAZOLIN SODIUM-DEXTROSE 2-3 GM-% IV SOLR
2.0000 g | Freq: Once | INTRAVENOUS | Status: AC
  Administered 2012-07-12: 2 g via INTRAVENOUS

## 2012-07-12 MED ORDER — FENTANYL CITRATE 0.05 MG/ML IJ SOLN
INTRAMUSCULAR | Status: DC | PRN
  Administered 2012-07-12: 100 ug via INTRAVENOUS
  Administered 2012-07-12 (×3): 25 ug via INTRAVENOUS
  Administered 2012-07-12: 50 ug via INTRAVENOUS

## 2012-07-12 MED ORDER — OXYCODONE-ACETAMINOPHEN 5-325 MG PO TABS: 1.0000 | ORAL_TABLET | Freq: Four times a day (QID) | ORAL | Status: DC | PRN

## 2012-07-12 MED ORDER — METOCLOPRAMIDE HCL 5 MG/ML IJ SOLN: 10.0000 mg | Freq: Once | INTRAMUSCULAR | Status: DC | PRN

## 2012-07-12 MED ORDER — MIDAZOLAM HCL 2 MG/2ML IJ SOLN: 1.0000 mg | INTRAMUSCULAR | Status: DC | PRN

## 2012-07-12 MED ORDER — CEFAZOLIN SODIUM-DEXTROSE 2-3 GM-% IV SOLR
2.0000 g | INTRAVENOUS | Status: AC
  Administered 2012-07-12: 2 g via INTRAVENOUS

## 2012-07-12 MED ORDER — DEXAMETHASONE SODIUM PHOSPHATE 4 MG/ML IJ SOLN
INTRAMUSCULAR | Status: DC | PRN
  Administered 2012-07-12: 4 mg via INTRAVENOUS

## 2012-07-12 MED ORDER — OXYCODONE HCL 5 MG PO TABS
5.0000 mg | ORAL_TABLET | Freq: Once | ORAL | Status: AC | PRN
  Administered 2012-07-12: 5 mg via ORAL

## 2012-07-12 MED ORDER — PROPOFOL 10 MG/ML IV BOLUS
INTRAVENOUS | Status: DC | PRN
  Administered 2012-07-12: 160 mg via INTRAVENOUS

## 2012-07-12 MED ORDER — LACTATED RINGERS IV SOLN
INTRAVENOUS | Status: DC
  Administered 2012-07-12 (×2): via INTRAVENOUS

## 2012-07-12 MED ORDER — ACETAMINOPHEN 10 MG/ML IV SOLN
1000.0000 mg | Freq: Once | INTRAVENOUS | Status: AC
Start: 2012-07-12 — End: 2012-07-12
  Administered 2012-07-12: 1000 mg via INTRAVENOUS

## 2012-07-12 MED ORDER — METOCLOPRAMIDE HCL 5 MG/ML IJ SOLN
INTRAMUSCULAR | Status: DC | PRN
  Administered 2012-07-12: 10 mg via INTRAVENOUS

## 2012-07-12 MED ORDER — ONDANSETRON HCL 4 MG/2ML IJ SOLN
INTRAMUSCULAR | Status: DC | PRN
  Administered 2012-07-12: 4 mg via INTRAVENOUS

## 2012-07-12 MED ORDER — OXYCODONE HCL 5 MG/5ML PO SOLN: 5.0000 mg | Freq: Once | ORAL | Status: AC | PRN

## 2012-07-12 MED ORDER — BUPIVACAINE HCL (PF) 0.5 % IJ SOLN
INTRAMUSCULAR | Status: DC | PRN
  Administered 2012-07-12: 20 mL

## 2012-07-12 MED ORDER — SCOPOLAMINE 1 MG/3DAYS TD PT72
1.0000 | MEDICATED_PATCH | Freq: Once | TRANSDERMAL | Status: DC
  Administered 2012-07-12: 1.5 mg via TRANSDERMAL

## 2012-07-12 MED ORDER — POVIDONE-IODINE 7.5 % EX SOLN: Freq: Once | CUTANEOUS | Status: DC

## 2012-07-12 MED ORDER — HYDROMORPHONE HCL PF 1 MG/ML IJ SOLN
0.2500 mg | INTRAMUSCULAR | Status: DC | PRN
  Administered 2012-07-12 (×3): 0.5 mg via INTRAVENOUS

## 2012-07-12 MED ORDER — LIDOCAINE HCL (CARDIAC) 20 MG/ML IV SOLN
INTRAVENOUS | Status: DC | PRN
  Administered 2012-07-12: 65 mg via INTRAVENOUS

## 2012-07-12 SURGICAL SUPPLY — 76 items
APL SKNCLS STERI-STRIP NONHPOA (GAUZE/BANDAGES/DRESSINGS)
BANDAGE ELASTIC 4 VELCRO ST LF (GAUZE/BANDAGES/DRESSINGS) ×2 IMPLANT
BANDAGE ELASTIC 6 VELCRO ST LF (GAUZE/BANDAGES/DRESSINGS) IMPLANT
BANDAGE ESMARK 6X9 LF (GAUZE/BANDAGES/DRESSINGS) IMPLANT
BENZOIN TINCTURE PRP APPL 2/3 (GAUZE/BANDAGES/DRESSINGS) IMPLANT
BIT DRILL 2.5X2.75 QC CALB (BIT) ×1 IMPLANT
BIT DRILL 2.9 CANN QC NONSTRL (BIT) ×1 IMPLANT
BIT DRILL 3.5X5.5 QC CALB (BIT) ×1 IMPLANT
BLADE SURG 15 STRL LF DISP TIS (BLADE) ×1 IMPLANT
BLADE SURG 15 STRL SS (BLADE) ×2
BNDG CMPR 9X4 STRL LF SNTH (GAUZE/BANDAGES/DRESSINGS) ×1
BNDG CMPR 9X6 STRL LF SNTH (GAUZE/BANDAGES/DRESSINGS) ×1
BNDG ESMARK 4X9 LF (GAUZE/BANDAGES/DRESSINGS) ×2 IMPLANT
BNDG ESMARK 6X9 LF (GAUZE/BANDAGES/DRESSINGS) ×2
CANISTER SUCTION 1200CC (MISCELLANEOUS) IMPLANT
CLOTH BEACON ORANGE TIMEOUT ST (SAFETY) ×2 IMPLANT
COVER TABLE BACK 60X90 (DRAPES) ×2 IMPLANT
CUFF TOURNIQUET SINGLE 18IN (TOURNIQUET CUFF) IMPLANT
DECANTER SPIKE VIAL GLASS SM (MISCELLANEOUS) IMPLANT
DRAPE EXTREMITY T 121X128X90 (DRAPE) ×2 IMPLANT
DRAPE OEC MINIVIEW 54X84 (DRAPES) ×2 IMPLANT
DRAPE U 20/CS (DRAPES) ×2 IMPLANT
DRAPE U-SHAPE 47X51 STRL (DRAPES) ×1 IMPLANT
DRSG EMULSION OIL 3X3 NADH (GAUZE/BANDAGES/DRESSINGS) ×2 IMPLANT
DURAPREP 26ML APPLICATOR (WOUND CARE) ×2 IMPLANT
ELECT REM PT RETURN 9FT ADLT (ELECTROSURGICAL) ×2
ELECTRODE REM PT RTRN 9FT ADLT (ELECTROSURGICAL) ×1 IMPLANT
GAUZE SPONGE 4X4 16PLY XRAY LF (GAUZE/BANDAGES/DRESSINGS) IMPLANT
GLOVE BIO SURGEON STRL SZ 6.5 (GLOVE) ×1 IMPLANT
GLOVE BIOGEL PI IND STRL 7.0 (GLOVE) IMPLANT
GLOVE BIOGEL PI IND STRL 8 (GLOVE) ×2 IMPLANT
GLOVE BIOGEL PI INDICATOR 7.0 (GLOVE) ×2
GLOVE BIOGEL PI INDICATOR 8 (GLOVE) ×2
GLOVE ECLIPSE 7.5 STRL STRAW (GLOVE) ×6 IMPLANT
GOWN BRE IMP PREV XXLGXLNG (GOWN DISPOSABLE) ×2 IMPLANT
GOWN PREVENTION PLUS XLARGE (GOWN DISPOSABLE) ×3 IMPLANT
GOWN PREVENTION PLUS XXLARGE (GOWN DISPOSABLE) ×2 IMPLANT
K-WIRE ACE 1.6X6 (WIRE) ×4
KWIRE ACE 1.6X6 (WIRE) IMPLANT
NEEDLE HYPO 22GX1.5 SAFETY (NEEDLE) IMPLANT
NS IRRIG 1000ML POUR BTL (IV SOLUTION) ×2 IMPLANT
PACK BASIN DAY SURGERY FS (CUSTOM PROCEDURE TRAY) ×2 IMPLANT
PAD CAST 4YDX4 CTTN HI CHSV (CAST SUPPLIES) ×1 IMPLANT
PADDING CAST ABS 4INX4YD NS (CAST SUPPLIES) ×3
PADDING CAST ABS COTTON 4X4 ST (CAST SUPPLIES) ×1 IMPLANT
PADDING CAST COTTON 4X4 STRL (CAST SUPPLIES) ×2
PADDING CAST COTTON 6X4 STRL (CAST SUPPLIES) IMPLANT
PENCIL BUTTON HOLSTER BLD 10FT (ELECTRODE) ×2 IMPLANT
PLATE ACE 100DEG 7HOLE (Plate) ×2 IMPLANT
SCREW ACE CAN 4.0 34M (Screw) ×1 IMPLANT
SCREW ACE CAN 4.0 44M (Screw) ×1 IMPLANT
SCREW CORTICAL 3.5MM  12MM (Screw) ×3 IMPLANT
SCREW CORTICAL 3.5MM 12MM (Screw) IMPLANT
SCREW NLOCK CANC HEX 4X14 (Screw) ×1 IMPLANT
SCREW NLOCK CANC HEX 4X16 (Screw) ×1 IMPLANT
SPLINT FAST PLASTER 5X30 (CAST SUPPLIES)
SPLINT FIBERGLASS 4X30 (CAST SUPPLIES) ×1 IMPLANT
SPLINT PLASTER CAST FAST 5X30 (CAST SUPPLIES) IMPLANT
SPONGE GAUZE 4X4 12PLY (GAUZE/BANDAGES/DRESSINGS) ×2 IMPLANT
SPONGE LAP 4X18 X RAY DECT (DISPOSABLE) IMPLANT
STAPLER VISISTAT (STAPLE) IMPLANT
STOCKINETTE 6  STRL (DRAPES) ×1
STOCKINETTE 6 STRL (DRAPES) ×1 IMPLANT
STRIP CLOSURE SKIN 1/2X4 (GAUZE/BANDAGES/DRESSINGS) IMPLANT
SUCTION FRAZIER TIP 10 FR DISP (SUCTIONS) ×1 IMPLANT
SUT ETHILON 4 0 PS 2 18 (SUTURE) ×1 IMPLANT
SUT MON AB 4-0 PC3 18 (SUTURE) IMPLANT
SUT VIC AB 2-0 SH 27 (SUTURE) ×2
SUT VIC AB 2-0 SH 27XBRD (SUTURE) ×1 IMPLANT
SUT VIC AB 3-0 FS2 27 (SUTURE) IMPLANT
SUT VICRYL 4-0 PS2 18IN ABS (SUTURE) IMPLANT
SYR 20CC LL (SYRINGE) IMPLANT
SYR BULB 3OZ (MISCELLANEOUS) ×2 IMPLANT
TOWEL OR NON WOVEN STRL DISP B (DISPOSABLE) ×2 IMPLANT
TUBE CONNECTING 20X1/4 (TUBING) IMPLANT
UNDERPAD 30X30 INCONTINENT (UNDERPADS AND DIAPERS) ×2 IMPLANT

## 2012-07-12 NOTE — Anesthesia Procedure Notes (Signed)
Procedure Name: LMA Insertion Date/Time: 07/12/2012 9:33 AM Performed by: Verlan Friends Pre-anesthesia Checklist: Patient identified, Emergency Drugs available, Suction available, Patient being monitored and Timeout performed Patient Re-evaluated:Patient Re-evaluated prior to inductionOxygen Delivery Method: Circle System Utilized Preoxygenation: Pre-oxygenation with 100% oxygen Intubation Type: IV induction Ventilation: Mask ventilation without difficulty LMA: LMA inserted LMA Size: 4.0 Number of attempts: 1 Airway Equipment and Method: bite block Placement Confirmation: positive ETCO2 Tube secured with: Tape Dental Injury: Teeth and Oropharynx as per pre-operative assessment

## 2012-07-12 NOTE — Progress Notes (Signed)
Post op d/c: pt states has med contract with Dr. Hadassah Pais re: pain medication has 120 of percocet at home.  Does not want today's Rx for percocet given by Dr Luiz Blare. Parents in attendance and aware of conversation. Rx shredded and witnessed by [employer].

## 2012-07-12 NOTE — Anesthesia Preprocedure Evaluation (Signed)
Anesthesia Evaluation  Patient identified by MRN, date of birth, ID band Patient awake    Reviewed: Allergy & Precautions, H&P , NPO status , Patient's Chart, lab work & pertinent test results, reviewed documented beta blocker date and time   Airway Mallampati: II TM Distance: >3 FB Neck ROM: full    Dental   Pulmonary neg pulmonary ROS,  breath sounds clear to auscultation        Cardiovascular hypertension, On Medications and On Home Beta Blockers + CAD, + Past MI, + CABG and + Peripheral Vascular Disease Rhythm:regular     Neuro/Psych PSYCHIATRIC DISORDERS  Neuromuscular disease    GI/Hepatic negative GI ROS, Neg liver ROS,   Endo/Other  diabetes, Oral Hypoglycemic Agents  Renal/GU negative Renal ROS  negative genitourinary   Musculoskeletal   Abdominal   Peds  Hematology negative hematology ROS (+)   Anesthesia Other Findings See surgeon's H&P   Reproductive/Obstetrics negative OB ROS                           Anesthesia Physical Anesthesia Plan  ASA: III  Anesthesia Plan: General   Post-op Pain Management:    Induction: Intravenous  Airway Management Planned: LMA  Additional Equipment:   Intra-op Plan:   Post-operative Plan: Extubation in OR  Informed Consent: I have reviewed the patients History and Physical, chart, labs and discussed the procedure including the risks, benefits and alternatives for the proposed anesthesia with the patient or authorized representative who has indicated his/her understanding and acceptance.   Dental Advisory Given  Plan Discussed with: CRNA and Surgeon  Anesthesia Plan Comments:         Anesthesia Quick Evaluation

## 2012-07-12 NOTE — H&P (Signed)
PREOPERATIVE H&P  Chief Complaint:R ankle pain  HPI: [name] is a 52 y.o. female who presents for evaluation of r ankle pain. It has been present for greater than 5 days and has been worsening. She has failed conservative measures. Pain is rated as severe.  Past Medical History  Diagnosis Date  . Back pain, chronic   . Depression   . Myocardial infarction 11/20/06  . Hypertension   . Hypercholesteremia   . Coronary artery disease   . Allergy   . Arthritis   . Neuromuscular disorder   . Heart attack   . Bronchitis     hx-no inhalers at home  . Diabetes mellitus     diet controlled  . DDD (degenerative disc disease), lumbar    Past Surgical History  Procedure Laterality Date  . Mandible surgery  2008  . Knee surgery  2009    lt   . Tubal ligation    . Breast surgery      rt br cystx2  . Abdominal hysterectomy  1984  . Coronary artery bypass graft  2008   History   Social History  . Marital Status: Married    Spouse Name: N/A    Number of Children: N/A  . Years of Education: N/A   Social History Main Topics  . Smoking status: Current Every Day Smoker -- 0.50 packs/day  . Smokeless tobacco: Never Used  . Alcohol Use: No  . Drug Use: None  . Sexually Active: None   Other Topics Concern  . None   Social History Narrative  . None   Family History  Problem Relation Age of Onset  . Heart disease Mother   . Cancer Mother   . Heart disease Father   . Stroke Father   . Hypertension Sister   . Hypertension Brother   . Cancer Daughter   . Hypertension Maternal Grandmother   . Hypertension Maternal Grandfather   . Cancer Maternal Grandfather   . Hypertension Paternal Grandmother   . Hypertension Paternal Grandfather    Allergies  Allergen Reactions  . Ibuprofen Swelling and Rash  . Prednisone Swelling and Rash  . Morphine     REACTION: Nausea  . Coconut Oil Swelling and Rash   Prior to Admission medications   Medication Sig Start  Date End Date Taking? Authorizing Matty Vanroekel  aspirin 81 MG tablet Take 81 mg by mouth 2 (two) times daily.     Historical Isatu Macinnes, MD  carvedilol (COREG) 3.125 MG tablet Take 1 tablet (3.125 mg total) by mouth 2 (two) times daily with a meal. 06/21/12   Sherren Mocha, MD  docusate sodium (COLACE) 100 MG capsule Take 100 mg by mouth 2 (two) times daily.    Historical Khailee Mick, MD  DULoxetine (CYMBALTA) 60 MG capsule Take 60 mg by mouth daily.      Historical Eavan Gonterman, MD  fish oil-omega-3 fatty acids 1000 MG capsule Take 2 g by mouth daily.      Historical Burna Atlas, MD  gabapentin (NEURONTIN) 400 MG capsule Take 2 capsules (800 mg total) by mouth 3 (three) times daily. 02/13/12   Sherren Mocha, MD  lisinopril (PRINIVIL,ZESTRIL) 5 MG tablet Take 1 tablet (5 mg total) by mouth daily. 05/06/12   Heather Jaquita Rector, PA-C  metoprolol succinate (TOPROL-XL) 25 MG 24 hr tablet Take 25 mg by mouth at bedtime.    Historical Candies Palm, MD  omeprazole (PRILOSEC) 40 MG capsule Take 1 capsule (40 mg total) by mouth  daily. 02/08/12   Sherren Mocha, MD  oxyCODONE-acetaminophen (ROXICET) 5-325 MG per tablet Take 2 tablets by mouth every 4 (four) hours as needed for pain. May fill on or after 09/07/12. 06/21/12   Sherren Mocha, MD  QUEtiapine (SEROQUEL XR) 300 MG 24 hr tablet Take 300 mg by mouth at bedtime. 300 mg + 50 mg=350 mg    Historical Abigaile Rossie, MD  QUEtiapine (SEROQUEL XR) 50 MG TB24 Take 50 mg by mouth at bedtime. 50 mg + 300 mg=350 mg    Historical Elspeth Blucher, MD  ranitidine (ZANTAC) 150 MG tablet Take 1 tablet (150 mg total) by mouth 2 (two) times daily. 06/21/12   Sherren Mocha, MD  rosuvastatin (CRESTOR) 20 MG tablet Take 1 tablet (20 mg total) by mouth at bedtime. 06/21/12   Sherren Mocha, MD  traMADol (ULTRAM) 50 MG tablet Take 1 tablet (50 mg total) by mouth 3 (three) times daily. 06/21/12   Sherren Mocha, MD     Positive ROS: none  All other systems have been reviewed and were otherwise negative with the exception of those  mentioned in the HPI and as above.  Physical Exam: There were no vitals filed for this visit.  General: Alert, no acute distress Cardiovascular: No pedal edema Respiratory: No cyanosis, no use of accessory musculature GI: No organomegaly, abdomen is soft and non-tender Skin: No lesions in the area of chief complaint Neurologic: Sensation intact distally Psychiatric: Patient is competent for consent with normal mood and affect Lymphatic: No axillary or cervical lymphadenopathy  MUSCULOSKELETAL: r ankle +STS and pain all ROM  X-RAY: bimal ankle fracture with mortise widening  Assessment/Plan: bi- malleolar fracture right  Plan for Procedure(s): OPEN REDUCTION INTERNAL FIXATION (ORIF) RIGHT ANKLE FRACTURE  The risks benefits and alternatives were discussed with the patient including but not limited to the risks of nonoperative treatment, versus surgical intervention including infection, bleeding, nerve injury, malunion, nonunion, hardware prominence, hardware failure, need for hardware removal, blood clots, cardiopulmonary complications, morbidity, mortality, among others, and they were willing to proceed.  Predicted outcome is good, although there will be at least a six to nine month expected recovery.  GRAVES,JOHN L, MD 07/12/2012 7:22 AM

## 2012-07-12 NOTE — Brief Op Note (Signed)
07/12/2012  10:51 AM  PATIENT:  [name]  52 y.o. female  PRE-OPERATIVE DIAGNOSIS:  bi- malleolar fracture right   POST-OPERATIVE DIAGNOSIS:  bi mallelar fracture of right ankle  PROCEDURE:  Procedure(s): OPEN REDUCTION INTERNAL FIXATION (ORIF) RIGHT ANKLE FRACTURE (Right)  SURGEON:  Surgeon(s) and Role:    * Harvie Junior, MD - Primary  PHYSICIAN ASSISTANT:   ASSISTANTS: bethune   ANESTHESIA:   general  EBL:  Total I/O In: 1000 [I.V.:1000] Out: -   BLOOD ADMINISTERED:none  DRAINS: none   LOCAL MEDICATIONS USED:  MARCAINE     SPECIMEN:  No Specimen  DISPOSITION OF SPECIMEN:  N/A  COUNTS:  YES  TOURNIQUET:  * Missing tourniquet times found for documented tourniquets in log:  16109 *  DICTATION: .Other Dictation: Dictation Number [id number]  PLAN OF CARE: Discharge to home after PACU  PATIENT DISPOSITION:  PACU - hemodynamically stable.   Delay start of Pharmacological VTE agent (>24hrs) due to surgical blood loss or risk of bleeding: no

## 2012-07-12 NOTE — Anesthesia Postprocedure Evaluation (Signed)
Anesthesia Post Note  Patient: [name]  Procedure(s) Performed: Procedure(s) (LRB): OPEN REDUCTION INTERNAL FIXATION (ORIF) RIGHT ANKLE FRACTURE (Right)  Anesthesia type: General  Patient location: PACU  Post pain: Pain level controlled  Post assessment: Patient's Cardiovascular Status Stable  Last Vitals:  Filed Vitals:   07/12/12 1300  BP: 112/57  Pulse: 72  Temp:   Resp: 13    Post vital signs: Reviewed and stable  Level of consciousness: alert  Complications: No apparent anesthesia complications

## 2012-07-12 NOTE — Transfer of Care (Signed)
Immediate Anesthesia Transfer of Care Note  Patient: [name]  Procedure(s) Performed: Procedure(s): OPEN REDUCTION INTERNAL FIXATION (ORIF) RIGHT ANKLE FRACTURE (Right)  Patient Location: PACU  Anesthesia Type:General  Level of Consciousness: sedated and unresponsive  Airway & Oxygen Therapy: Patient Spontanous Breathing and Patient connected to face mask oxygen  Post-op Assessment: Report given to PACU RN and Post -op Vital signs reviewed and stable  Post vital signs: Reviewed and stable  Complications: No apparent anesthesia complications

## 2012-07-15 ENCOUNTER — Encounter (HOSPITAL_BASED_OUTPATIENT_CLINIC_OR_DEPARTMENT_OTHER): Payer: Self-pay | Admitting: Orthopedic Surgery

## 2012-07-15 NOTE — Op Note (Signed)
NAMEJORDAIN, RADIN        ACCOUNT NO.:  [id number]  MEDICAL RECORD NO.:  [id number]  LOCATION:                                 FACILITY:  PHYSICIAN:  Harvie Junior, M.D.   DATE OF BIRTH:  [birth year]  DATE OF PROCEDURE:  07/12/2012 DATE OF DISCHARGE:                              OPERATIVE REPORT   PREOPERATIVE DIAGNOSIS:  Trimalleolar ankle fracture, right.  POSTOPERATIVE DIAGNOSIS:  Trimalleolar ankle fracture, right.  PROCEDURE:  Open reduction and internal fixation of medial malleolus with 2 cannulated screws and fixation of lateral malleolus with a 7-hole 1/3rd tubular plate and interfragmentary fixation.  SURGEON:  Harvie Junior, M.D.  ASSISTANT:  Marshia Ly, PA  ANESTHESIA:  General.  BRIEF HISTORY:  [name] is a 52 year old female, who had a twisting injury to her ankle suffered a comminuted fracture of the ankle.  We were consulted and talked about treating her that night versus an outpatient.  She wanted to be brought to the office and treated as an outpatient.  She showed up at the office.  X-ray showed a trimalleolar ankle fracture.  We talked about treatment options and felt that open reduction and internal fixation is the most appropriate course of action.  She was brought to the operating room for this procedure.  DESCRIPTION OF PROCEDURE:  The patient was brought to the operating room and after adequate anesthesia was obtained with general anesthetic, the patient was placed supine on the operating table.  The right leg was prepped and draped in usual sterile fashion.  Following this, the leg was exsanguinated.  Blood pressure tourniquet was inflated to 300 mmHg. Following this, a lateral incision was made in the subcutaneous tissue down to the level of fibula.  The fibular fracture was identified, manipulatively reduced, held in place.  Initial healing elements were removed as well as curetting the bone to get an anatomic  reduction. Once the patella was held with a clamp, attention turned to the medial side.  On the medial side, we were able to identify a small fragment which was held into an absolute anatomic reduced position and then we were able to put 2 screws across this to hold it in place to get good antirotation and compression.  Once it was done, attention turned back to the lateral side where an interfragmentary screw was placed with the fibula being held in anatomic reduction with a clamp.  A 7 hole 1/3rd tubular was used because the upper spiral of the fracture did kind of go in the bottom hole of the 6 hole plate.  We felt that 7 would be a better choice and this was used.  We got 3 great screws proximally, 2 screws in the distal fragment.  Anatomic alignment was achieved. Fluoro at this time showed anatomic alignment with good screw position and fixation.  The posterior malleolus was well reduced.  At this point, the wound was copiously and thoroughly lavaged, suctioned dry, closed on each side with 4-0 Vicryl on the medial side, 2-0 Vicryl on the lateral, and the skin was closed with 4-0 nylon interrupted.  Sterile compressive dressing was applied.  U and posterior splint with a posterior splint covering  along the great toe and sterile compressive dressing was then applied over the splint and the patient was taken to the recovery room and she was noted to be in satisfactory condition.  Estimated blood loss for procedure was none.     Harvie Junior, M.D.     Ranae Plumber  D:  07/12/2012  T:  07/13/2012  Job:  [id number]

## 2012-07-18 DIAGNOSIS — F319 Bipolar disorder, unspecified: Secondary | ICD-10-CM | POA: Diagnosis not present

## 2012-07-18 DIAGNOSIS — F4310 Post-traumatic stress disorder, unspecified: Secondary | ICD-10-CM | POA: Diagnosis not present

## 2012-07-29 ENCOUNTER — Telehealth: Payer: Self-pay

## 2012-07-29 NOTE — Telephone Encounter (Signed)
Yes to both.  I saw the ER note about pt too!!! That is horrible - I'm so sorry it happened to her.  Yes, fine to the oxycodone - I really appreciate her being so diligent about her contract. She can take try the zantac but if she is still having symptoms, then switch the omeprazole.

## 2012-07-29 NOTE — Telephone Encounter (Signed)
Called patient to advise.   Left detailed message

## 2012-07-29 NOTE — Telephone Encounter (Signed)
PT STATES SHE HAD FALLEN AND BROKE BOTH ANKLES SHE NOW HAVE PINS IN THEM AND HAD TO DOUBLE UP ON HER PAIN MEDS. WOULD LIKE TO SPEAK WITH SOMEONE ABOUT THE PAIN MEDICINES SHE WAS GIVEN FROM ANOTHER DR. PLEASE CALL [phone]

## 2012-07-29 NOTE — Telephone Encounter (Signed)
Called her, she states she has a pain contract with you. She did have recent ankle surg, plates and screws/ Dr Luiz Blare gave her a Rx for #40 of oxycodone she wants you to okay before she gets this filled. She wants to know also if you want her to take the omeprazole or the Zantac, I advised to take the Zantac.

## 2012-08-06 ENCOUNTER — Other Ambulatory Visit: Payer: Self-pay | Admitting: Radiology

## 2012-08-06 MED ORDER — LISINOPRIL 5 MG PO TABS: 5.0000 mg | ORAL_TABLET | Freq: Every day | ORAL | Status: DC

## 2012-08-06 NOTE — Telephone Encounter (Signed)
Pharmacy/ lisinopril

## 2012-08-09 ENCOUNTER — Telehealth: Payer: Self-pay

## 2012-08-09 NOTE — Telephone Encounter (Signed)
PT DROPPED OFF AN APPLICATION FOR DISABILITY PARKING PLACARD AND A CERTIFICATION OF DISABILITY PAPER.  BOTH OF THESE ARE TO GO TO DR. SHAW SHE SAYS.  PLEASE CALL [phone] WHEN THEY ARE READY TO PICK UP

## 2012-08-13 NOTE — Telephone Encounter (Signed)
These were placed at front desk for pick up patient was advised.

## 2012-08-13 NOTE — Telephone Encounter (Signed)
Completed yesterday and given to assistant.

## 2012-08-19 ENCOUNTER — Other Ambulatory Visit: Payer: Self-pay | Admitting: Radiology

## 2012-08-19 ENCOUNTER — Telehealth: Payer: Self-pay

## 2012-08-19 MED ORDER — RANITIDINE HCL 150 MG PO TABS: 150.0000 mg | ORAL_TABLET | Freq: Two times a day (BID) | ORAL | Status: DC

## 2012-08-19 NOTE — Telephone Encounter (Signed)
Advised her they are ready for pickup.

## 2012-08-19 NOTE — Telephone Encounter (Signed)
Pt states she dropped off two forms for dr Clelia Croft about two weeks ago. States one was for a handicap placard and the other was for taxes regarding her disability.   Pt has not heard back about these forms.   Best: [phone]  bf

## 2012-08-19 NOTE — Telephone Encounter (Signed)
Pharmacy called about zantac, this is sent. Dr Clelia Croft wants her to continue this.

## 2012-09-02 ENCOUNTER — Other Ambulatory Visit: Payer: Self-pay

## 2012-09-02 MED ORDER — GABAPENTIN 400 MG PO CAPS: 800.0000 mg | ORAL_CAPSULE | Freq: Three times a day (TID) | ORAL | Status: DC

## 2012-09-20 ENCOUNTER — Other Ambulatory Visit: Payer: Self-pay | Admitting: Physician Assistant

## 2012-09-20 MED ORDER — RANITIDINE HCL 150 MG PO TABS: 150.0000 mg | ORAL_TABLET | Freq: Two times a day (BID) | ORAL | Status: DC

## 2012-09-27 ENCOUNTER — Ambulatory Visit (INDEPENDENT_AMBULATORY_CARE_PROVIDER_SITE_OTHER): Payer: Medicare PPO | Admitting: Family Medicine

## 2012-09-27 ENCOUNTER — Encounter: Payer: Self-pay | Admitting: Family Medicine

## 2012-09-27 VITALS — BP 140/60 | HR 59 | Temp 98.2°F | Resp 16 | Ht 62.0 in | Wt 150.8 lb

## 2012-09-27 DIAGNOSIS — E7800 Pure hypercholesterolemia, unspecified: Secondary | ICD-10-CM

## 2012-09-27 DIAGNOSIS — I25810 Atherosclerosis of coronary artery bypass graft(s) without angina pectoris: Secondary | ICD-10-CM

## 2012-09-27 DIAGNOSIS — R7303 Prediabetes: Secondary | ICD-10-CM

## 2012-09-27 DIAGNOSIS — E785 Hyperlipidemia, unspecified: Secondary | ICD-10-CM

## 2012-09-27 DIAGNOSIS — M549 Dorsalgia, unspecified: Secondary | ICD-10-CM

## 2012-09-27 DIAGNOSIS — Z79899 Other long term (current) drug therapy: Secondary | ICD-10-CM

## 2012-09-27 DIAGNOSIS — M48061 Spinal stenosis, lumbar region without neurogenic claudication: Secondary | ICD-10-CM

## 2012-09-27 DIAGNOSIS — I8003 Phlebitis and thrombophlebitis of superficial vessels of lower extremities, bilateral: Secondary | ICD-10-CM

## 2012-09-27 LAB — COMPREHENSIVE METABOLIC PANEL
ALT: <8 U/L (ref 0–35)
AST: 13 U/L (ref 0–37)
Albumin: 4 g/dL (ref 3.5–5.2)
Alkaline Phosphatase: 110 U/L (ref 39–117)
Potassium: 4.5 mEq/L (ref 3.5–5.3)
Sodium: 140 mEq/L (ref 135–145)
Total Protein: 6.6 g/dL (ref 6.0–8.3)

## 2012-09-27 LAB — LIPID PANEL
HDL: 32 mg/dL — ABNORMAL LOW (ref >39)
LDL Cholesterol: 78 mg/dL (ref 0–99)

## 2012-09-27 MED ORDER — OXYCODONE-ACETAMINOPHEN 5-325 MG PO TABS: 1.0000 | ORAL_TABLET | Freq: Four times a day (QID) | ORAL | Status: DC | PRN

## 2012-09-27 MED ORDER — OMEPRAZOLE 40 MG PO CPDR: 40.0000 mg | DELAYED_RELEASE_CAPSULE | Freq: Every day | ORAL | Status: DC

## 2012-09-27 MED ORDER — LISINOPRIL 5 MG PO TABS: 5.0000 mg | ORAL_TABLET | Freq: Every day | ORAL | Status: DC

## 2012-09-27 MED ORDER — CARVEDILOL 3.125 MG PO TABS: 3.1250 mg | ORAL_TABLET | Freq: Two times a day (BID) | ORAL | Status: DC

## 2012-09-27 NOTE — Progress Notes (Signed)
Subjective:    Patient ID: [name], female    DOB: [birth year], 52 y.o.   MRN: [id number] Chief Complaint  Patient presents with  . Follow-up    MEDICATIONS  . Medication Refill   HPI  [name] is here to f/u on her chronic medical conditions and get meds refilled.  She fell 10 wks ago (beginning of March) with severe ankle fracture resulting in extensive ORIF - plates and screws.  Doing better - is now weight bearing but still swells a lot when on her feet so wearing compression stockings.  Still a lot of pain wear the plate is and can't push off the ball of her foot but she is regaining motion and overall healing well. She does not that her back pain did a lot better when she was using her walking during the ankle recovery and is now worsening again now that she is walking w/o assistance.  Dr. Luiz Blare, from [facility] who repaired her ankle, noted that she had had a lot of falls as well as a sig abnml L-spine MRI so suggested pt follow-up with the spine specialist at [facility]. Pt has to have her records and scans sent over from Dr. Ermelinda Das office before she can schedule.  She doesn't want to do injections - they didn't help at all - and she is not looking forward to being off her feet for back surgery - just joined the [employer] and wants to start exercising and loosing weight w/ her mother as soon as her ankle can tolerate it. Very proud that she managed to loose a few lbs in the past sev mos due to watching diet despite her immobilization.  No desire to increase pain medication any further despite uncontrolled pain due to severe constipation - stopped tramadol. Really wasn't helping and was expensive.  Toprol making her loose her hair  Getting 5 hrs sleep/night now which is an improvement.  Knows she needs to cont using walker/cane but not - will restart.  Past Medical History  Diagnosis Date  . Back pain, chronic   . Depression   . Myocardial infarction 11/20/06  .  Hypertension   . Hypercholesteremia   . Coronary artery disease   . Allergy   . Arthritis   . Neuromuscular disorder   . Heart attack   . Bronchitis     hx-no inhalers at home  . Diabetes mellitus     diet controlled  . DDD (degenerative disc disease), lumbar    Past Surgical History  Procedure Laterality Date  . Mandible surgery  2008  . Knee surgery  2009    lt   . Tubal ligation    . Breast surgery      rt br cystx2  . Abdominal hysterectomy  1984  . Coronary artery bypass graft  2008  . Orif ankle fracture Right 07/12/2012    Procedure: OPEN REDUCTION INTERNAL FIXATION (ORIF) RIGHT ANKLE FRACTURE;  Surgeon: Harvie Junior, MD;  Location:  SURGERY CENTER;  Service: Orthopedics;  Laterality: Right;   Current Outpatient Prescriptions on File Prior to Visit  Medication Sig Dispense Refill  . aspirin 81 MG tablet Take 81 mg by mouth 2 (two) times daily.       Marland Kitchen docusate sodium (COLACE) 100 MG capsule Take 100 mg by mouth 2 (two) times daily.      . DULoxetine (CYMBALTA) 60 MG capsule Take 60 mg by mouth daily.        Marland Kitchen  fish oil-omega-3 fatty acids 1000 MG capsule Take 2 g by mouth daily.        Marland Kitchen gabapentin (NEURONTIN) 400 MG capsule Take 2 capsules (800 mg total) by mouth 3 (three) times daily.  180 capsule  2  . QUEtiapine (SEROQUEL XR) 300 MG 24 hr tablet Take 300 mg by mouth at bedtime. 300 mg + 50 mg=350 mg      . QUEtiapine (SEROQUEL XR) 50 MG TB24 Take 50 mg by mouth at bedtime. 50 mg + 300 mg=350 mg      . ranitidine (ZANTAC) 150 MG tablet Take 1 tablet (150 mg total) by mouth 2 (two) times daily.  180 tablet  1  . rosuvastatin (CRESTOR) 20 MG tablet Take 1 tablet (20 mg total) by mouth at bedtime.  90 tablet  0   No current facility-administered medications on file prior to visit.   Allergies  Allergen Reactions  . Ibuprofen Swelling and Rash  . Prednisone Swelling and Rash  . Morphine     REACTION: Nausea  . Coconut Oil Swelling and Rash   History    Social History  . Marital Status: Married    Spouse Name: N/A    Number of Children: N/A  . Years of Education: N/A   Social History Main Topics  . Smoking status: Current Every Day Smoker -- 0.50 packs/day  . Smokeless tobacco: Never Used  . Alcohol Use: No  . Drug Use: None  . Sexually Active: None   Other Topics Concern  . None   Social History Narrative  . None    Review of Systems  Constitutional: Positive for fatigue. Negative for fever and chills.  Gastrointestinal: Negative for nausea, vomiting, abdominal pain, diarrhea and constipation.  Genitourinary: Negative for urgency, frequency, decreased urine volume and difficulty urinating.  Musculoskeletal: Positive for myalgias, back pain, joint swelling, arthralgias and gait problem.  Skin: Positive for color change and rash. Negative for wound.  Neurological: Positive for weakness and numbness.  Hematological: Negative for adenopathy. Bruises/bleeds easily.  Psychiatric/Behavioral: Positive for sleep disturbance.     BP 140/60  Pulse 59  Temp(Src) 98.2 F (36.8 C) (Oral)  Resp 16  Ht 5\' 2"  (1.575 m)  Wt 150 lb 12.8 oz (68.402 kg)  BMI 27.57 kg/m2  SpO2 93% Objective:   Physical Exam  Constitutional: She is oriented to person, place, and time. She appears well-developed and well-nourished. No distress.  HENT:  Head: Normocephalic and atraumatic.  Right Ear: External ear normal.  Left Ear: External ear normal.  Eyes: Conjunctivae are normal. No scleral icterus.  Neck: Normal range of motion. Neck supple. No thyromegaly present.  Cardiovascular: Normal rate, regular rhythm, normal heart sounds and intact distal pulses.   Pulses:      Dorsalis pedis pulses are 2+ on the right side.       Posterior tibial pulses are 2+ on the right side.  Pulmonary/Chest: Effort normal and breath sounds normal. No respiratory distress.  Musculoskeletal: She exhibits no edema.       Right ankle: She exhibits decreased range of  motion, swelling and ecchymosis. She exhibits no laceration and normal pulse. Tenderness.  Good flexion and extension at ankle, no inversion/eversion. Scars healed well  Lymphadenopathy:    She has no cervical adenopathy.  Neurological: She is alert and oriented to person, place, and time.  Skin: Skin is warm and dry. Rash noted. Rash is macular. She is not diaphoretic. There is erythema.  Diffuse non-blanching macular  erythema over right lower extremity due to chronic SVT  Psychiatric: She has a normal mood and affect. Her behavior is normal.      Assessment & Plan:  Encounter for long-term (current) use of other medications - Plan: Comprehensive metabolic panel, Lipid panel  Superficial phlebitis and thrombophlebitis of the leg, bilateral -asa and compression, chronic  BACK PAIN, CHRONIC due to Spinal stenosis of lumbar region - [facility] reviewed and appropriate.  Oxycodone refilled x 3 mos - no refill needed till 9/10 so pt will f/u in August. At that time, she plans to have established with the neurosurg/spine specialist at [facility]. Release signed today so we can send recs and she will req records transferred to [facility] from Dr. Ermelinda Das office.  Has to start using pref walker but at least cane at ALL times as pt is a HUGE fall risk due to her lower ext weakness and numbness - pt understands and is agreeable.  CAD, ARTERY BYPASS GRAFT - statin, bb, acei, asa - use carvedilol rather than toprol due to pt's concern of hair loss from the toprol.  Glucose intolerance (pre-diabetes) - a1c 5.9 6 mos prev. Recheck at f/u  HYPERCHOLESTEROLEMIA - Goal LDL <100 - check flp today  Meds ordered this encounter  Medications  . DISCONTD: oxyCODONE-acetaminophen (PERCOCET/ROXICET) 5-325 MG per tablet    Sig: Take 1-2 tablets by mouth every 6 (six) hours as needed for pain. NEED REFILL PER PATIENT 3 MONTHS  . DISCONTD: oxyCODONE-acetaminophen (PERCOCET/ROXICET) 5-325 MG per tablet    Sig:  Take 1-2 tablets by mouth every 6 (six) hours as needed for pain. May fill on or after 10/08/12    Dispense:  150 tablet    Refill:  0  . DISCONTD: oxyCODONE-acetaminophen (PERCOCET/ROXICET) 5-325 MG per tablet    Sig: Take 1-2 tablets by mouth every 6 (six) hours as needed for pain. May fill on or after 11/07/12    Dispense:  150 tablet    Refill:  0  . oxyCODONE-acetaminophen (PERCOCET/ROXICET) 5-325 MG per tablet    Sig: Take 1-2 tablets by mouth every 6 (six) hours as needed for pain. May fill on or after 12/08/12    Dispense:  150 tablet    Refill:  0  . DISCONTD: omeprazole (PRILOSEC) 40 MG capsule    Sig: Take 1 capsule (40 mg total) by mouth daily.    Dispense:  30 capsule    Refill:  3  . lisinopril (PRINIVIL,ZESTRIL) 5 MG tablet    Sig: Take 1 tablet (5 mg total) by mouth daily.    Dispense:  30 tablet    Refill:  3  . carvedilol (COREG) 3.125 MG tablet    Sig: Take 1 tablet (3.125 mg total) by mouth 2 (two) times daily with a meal.    Dispense:  60 tablet    Refill:  3  . omeprazole (PRILOSEC) 40 MG capsule    Sig: Take 1 capsule (40 mg total) by mouth daily.    Dispense:  30 capsule    Refill:  3

## 2012-11-20 ENCOUNTER — Telehealth: Payer: Self-pay

## 2012-11-20 DIAGNOSIS — Z1231 Encounter for screening mammogram for malignant neoplasm of breast: Secondary | ICD-10-CM

## 2012-11-20 DIAGNOSIS — Z1239 Encounter for other screening for malignant neoplasm of breast: Secondary | ICD-10-CM

## 2012-11-20 NOTE — Telephone Encounter (Signed)
Dr.Shaw, Pt is requesting a referral to have a mammogram done. Best# [phone]

## 2012-11-20 NOTE — Telephone Encounter (Signed)
Sure, does she have any concerns or is this just her routine screening mammogram?

## 2012-11-21 NOTE — Telephone Encounter (Signed)
Pt would like just her regular screening done. No concerns at this time. She was curious about the PAP schedule?

## 2012-11-25 ENCOUNTER — Telehealth: Payer: Self-pay | Admitting: Cardiology

## 2012-11-25 NOTE — Telephone Encounter (Signed)
Left message to advise

## 2012-11-25 NOTE — Telephone Encounter (Signed)
Spoke with patient who states she does not know who her cardiologist is now that Dr. Riley Kill has retired. Patient states Dr. Riley Kill recommended a stress test at [facility] on 12/12 and she has not scheduled yet due to different life circumstances that prevented her from doing so.  I asked patient if there was a particular cardiologist that she would like to see.  Patient states she would like to see whomever has the first available appointment as she is past due for f/u.  I advised patient that the first available appointment is with Dr. Myrtis Ser 8/25 but that I have to send her information to him for his approval. Patient verbalized understanding. I booked 30 min appointment with Dr. Myrtis Ser at 2:15 on 8/25 in [facility] so that the spot would be held.  Please notify patient if Dr. Myrtis Ser does not approve so that appointment can be rescheduled with someone else.

## 2012-11-25 NOTE — Telephone Encounter (Signed)
Yes, please order screening mammogram.  Pt does not need pap because she has had hysterectomy (not for cancer) if I recall correctly.  If she does have cervix - needs 3 yrs from last as long as all have been normal in the past 10 yrs.

## 2012-11-25 NOTE — Telephone Encounter (Signed)
New problem   Pt calling to see who she'll be seeing since dr Riley Kill retired

## 2012-11-27 NOTE — Telephone Encounter (Signed)
**Note De-Identified  Obfuscation** Dr Myrtis Ser will see this pt. Appointment has been confirmed with pt.

## 2012-12-04 ENCOUNTER — Other Ambulatory Visit: Payer: Self-pay

## 2012-12-04 MED ORDER — GABAPENTIN 400 MG PO CAPS: 800.0000 mg | ORAL_CAPSULE | Freq: Three times a day (TID) | ORAL | Status: DC

## 2012-12-05 ENCOUNTER — Ambulatory Visit
Admission: RE | Admit: 2012-12-05 | Discharge: 2012-12-05 | Disposition: A | Discharge status: Home / Self Care | Payer: Medicare HMO | Source: Ambulatory Visit | Attending: Family Medicine | Admitting: Family Medicine

## 2012-12-05 DIAGNOSIS — Z1231 Encounter for screening mammogram for malignant neoplasm of breast: Secondary | ICD-10-CM

## 2012-12-06 LAB — LIPID PANEL: LDL Cholesterol: 88 mg/dL

## 2012-12-08 ENCOUNTER — Ambulatory Visit (INDEPENDENT_AMBULATORY_CARE_PROVIDER_SITE_OTHER): Payer: Medicare PPO | Admitting: Family Medicine

## 2012-12-08 ENCOUNTER — Ambulatory Visit: Payer: Medicare PPO

## 2012-12-08 VITALS — BP 110/60 | HR 67 | Temp 98.0°F | Resp 16 | Ht 61.5 in | Wt 158.0 lb

## 2012-12-08 DIAGNOSIS — F17200 Nicotine dependence, unspecified, uncomplicated: Secondary | ICD-10-CM

## 2012-12-08 DIAGNOSIS — R7303 Prediabetes: Secondary | ICD-10-CM

## 2012-12-08 DIAGNOSIS — R599 Enlarged lymph nodes, unspecified: Secondary | ICD-10-CM

## 2012-12-08 DIAGNOSIS — R7309 Other abnormal glucose: Secondary | ICD-10-CM

## 2012-12-08 DIAGNOSIS — R590 Localized enlarged lymph nodes: Secondary | ICD-10-CM

## 2012-12-08 LAB — POCT CBC
Hemoglobin: 15.1 g/dL (ref 12.2–16.2)
Lymph, poc: 2.9 (ref 0.6–3.4)
MCH, POC: 30.1 pg (ref 27–31.2)
MCHC: 32.5 g/dL (ref 31.8–35.4)
MPV: 9.8 fL (ref 0–99.8)
POC MID %: 6.3 %M (ref 0–12)
WBC: 7.9 10*3/uL (ref 4.6–10.2)

## 2012-12-08 LAB — T3, FREE: T3, Free: 2.6 pg/mL (ref 2.3–4.2)

## 2012-12-08 LAB — RPR

## 2012-12-08 LAB — TSH: TSH: 0.589 u[IU]/mL (ref 0.350–4.500)

## 2012-12-08 MED ORDER — AMOXICILLIN-POT CLAVULANATE 875-125 MG PO TABS: 1.0000 | ORAL_TABLET | Freq: Two times a day (BID) | ORAL | Status: DC

## 2012-12-08 NOTE — Progress Notes (Signed)
Subjective:    Patient ID: [name], female    DOB: 20-Sep-1960, 52 y.o.   MRN: [id number] Chief Complaint  Patient presents with  . Adenopathy    on right side x 3 week    HPI  Current Outpatient Prescriptions on File Prior to Visit  Medication Sig Dispense Refill  . aspirin 81 MG tablet Take 81 mg by mouth 2 (two) times daily.       . carvedilol (COREG) 3.125 MG tablet Take 1 tablet (3.125 mg total) by mouth 2 (two) times daily with a meal.  60 tablet  3  . docusate sodium (COLACE) 100 MG capsule Take 100 mg by mouth 2 (two) times daily.      . DULoxetine (CYMBALTA) 60 MG capsule Take 60 mg by mouth daily.        . fish oil-omega-3 fatty acids 1000 MG capsule Take 2 g by mouth daily.        Marland Kitchen gabapentin (NEURONTIN) 400 MG capsule Take 2 capsules (800 mg total) by mouth 3 (three) times daily.  180 capsule  2  . lisinopril (PRINIVIL,ZESTRIL) 5 MG tablet Take 1 tablet (5 mg total) by mouth daily.  30 tablet  3  . oxyCODONE-acetaminophen (PERCOCET/ROXICET) 5-325 MG per tablet Take 1-2 tablets by mouth every 6 (six) hours as needed for pain. May fill on or after 12/08/12  150 tablet  0  . QUEtiapine (SEROQUEL XR) 300 MG 24 hr tablet Take 300 mg by mouth at bedtime. 300 mg + 50 mg=350 mg      . QUEtiapine (SEROQUEL XR) 50 MG TB24 Take 50 mg by mouth at bedtime. 50 mg + 300 mg=350 mg      . ranitidine (ZANTAC) 150 MG tablet Take 1 tablet (150 mg total) by mouth 2 (two) times daily.  180 tablet  1  . rosuvastatin (CRESTOR) 20 MG tablet Take 1 tablet (20 mg total) by mouth at bedtime.  90 tablet  0  . omeprazole (PRILOSEC) 40 MG capsule Take 1 capsule (40 mg total) by mouth daily.  30 capsule  3   No current facility-administered medications on file prior to visit.     Review of Systems  Gastrointestinal:       Heartburn      BP 110/60  Pulse 67  Temp(Src) 98 F (36.7 C) (Oral)  Resp 16  Ht 5' 1.5" (1.562 m)  Wt 158 lb (71.668 kg)  BMI 29.37 kg/m2  SpO2  95% Objective:   Physical Exam        Results for orders placed in visit on 12/08/12  POCT CBC      Result Value Range   WBC 7.9  4.6 - 10.2 K/uL   Lymph, poc 2.9  0.6 - 3.4   POC LYMPH PERCENT 36.5  10 - 50 %L   MID (cbc) 0.5  0 - 0.9   POC MID % 6.3  0 - 12 %M   POC Granulocyte 4.5  2 - 6.9   Granulocyte percent 57.2  37 - 80 %G   RBC 5.01  4.04 - 5.48 M/uL   Hemoglobin 15.1  12.2 - 16.2 g/dL   HCT, POC 40.9  81.1 - 47.9 %   MCV 92.9  80 - 97 fL   MCH, POC 30.1  27 - 31.2 pg   MCHC 32.5  31.8 - 35.4 g/dL   RDW, POC 91.4     Platelet Count, POC 196  142 -  424 K/uL   MPV 9.8  0 - 99.8 fL  GLUCOSE, POCT (MANUAL RESULT ENTRY)      Result Value Range   POC Glucose 87  70 - 99 mg/dl  POCT GLYCOSYLATED HEMOGLOBIN (HGB A1C)      Result Value Range   Hemoglobin A1C 5.8      [facility] reading (PRIMARY) by  Dr. Clelia Croft. CXR: No acute abnormality.  Prior cabg. Assessment & Plan:  Discuss brca testing at next visit.  Anterior cervical adenopathy - Plan: DG Chest 2 View, TSH, RPR, Epstein-Barr virus VCA antibody panel, ANA, T3, free, T4, Free, HIV antibody, POCT CBC, POCT glucose (manual entry), POCT glycosylated hemoglobin (Hb A1C), POCT SEDIMENTATION RATE  Tobacco use disorder - Plan: DG Chest 2 View, TSH, RPR, Epstein-Barr virus VCA antibody panel, ANA, T3, free, T4, Free, HIV antibody, POCT CBC, POCT glucose (manual entry), POCT glycosylated hemoglobin (Hb A1C), POCT SEDIMENTATION RATE  Glucose intolerance (pre-diabetes) - Plan: DG Chest 2 View, TSH, RPR, Epstein-Barr virus VCA antibody panel, ANA, T3, free, T4, Free, HIV antibody, POCT CBC, POCT glucose (manual entry), POCT glycosylated hemoglobin (Hb A1C), POCT SEDIMENTATION RATE  Meds ordered this encounter  Medications  . amoxicillin-clavulanate (AUGMENTIN) 875-125 MG per tablet    Sig: Take 1 tablet by mouth 2 (two) times daily.    Dispense:  20 tablet    Refill:  0

## 2012-12-09 ENCOUNTER — Encounter: Payer: Self-pay | Admitting: Family Medicine

## 2012-12-10 LAB — EPSTEIN-BARR VIRUS VCA ANTIBODY PANEL
EBV VCA IgG: 415 U/mL — ABNORMAL HIGH (ref <18.0)
EBV VCA IgM: <10 U/mL (ref <36.0)

## 2012-12-17 DIAGNOSIS — F319 Bipolar disorder, unspecified: Secondary | ICD-10-CM | POA: Diagnosis not present

## 2012-12-20 ENCOUNTER — Ambulatory Visit (INDEPENDENT_AMBULATORY_CARE_PROVIDER_SITE_OTHER): Payer: Medicare PPO | Admitting: Family Medicine

## 2012-12-20 ENCOUNTER — Encounter: Payer: Self-pay | Admitting: Cardiology

## 2012-12-20 VITALS — BP 118/76 | HR 78 | Temp 98.0°F | Resp 16 | Ht 62.0 in | Wt 156.0 lb

## 2012-12-20 DIAGNOSIS — M5136 Other intervertebral disc degeneration, lumbar region: Secondary | ICD-10-CM | POA: Insufficient documentation

## 2012-12-20 DIAGNOSIS — I2510 Atherosclerotic heart disease of native coronary artery without angina pectoris: Secondary | ICD-10-CM | POA: Insufficient documentation

## 2012-12-20 DIAGNOSIS — Z951 Presence of aortocoronary bypass graft: Secondary | ICD-10-CM

## 2012-12-20 DIAGNOSIS — G709 Myoneural disorder, unspecified: Secondary | ICD-10-CM | POA: Insufficient documentation

## 2012-12-20 DIAGNOSIS — M48061 Spinal stenosis, lumbar region without neurogenic claudication: Secondary | ICD-10-CM

## 2012-12-20 DIAGNOSIS — M549 Dorsalgia, unspecified: Secondary | ICD-10-CM

## 2012-12-20 DIAGNOSIS — G8929 Other chronic pain: Secondary | ICD-10-CM

## 2012-12-20 DIAGNOSIS — I10 Essential (primary) hypertension: Secondary | ICD-10-CM | POA: Insufficient documentation

## 2012-12-20 DIAGNOSIS — F329 Major depressive disorder, single episode, unspecified: Secondary | ICD-10-CM | POA: Insufficient documentation

## 2012-12-20 DIAGNOSIS — R9430 Abnormal result of cardiovascular function study, unspecified: Secondary | ICD-10-CM | POA: Insufficient documentation

## 2012-12-20 DIAGNOSIS — F32A Depression, unspecified: Secondary | ICD-10-CM | POA: Insufficient documentation

## 2012-12-20 DIAGNOSIS — R590 Localized enlarged lymph nodes: Secondary | ICD-10-CM

## 2012-12-20 DIAGNOSIS — E079 Disorder of thyroid, unspecified: Secondary | ICD-10-CM

## 2012-12-20 DIAGNOSIS — R599 Enlarged lymph nodes, unspecified: Secondary | ICD-10-CM

## 2012-12-20 DIAGNOSIS — M1990 Unspecified osteoarthritis, unspecified site: Secondary | ICD-10-CM | POA: Insufficient documentation

## 2012-12-20 DIAGNOSIS — E785 Hyperlipidemia, unspecified: Secondary | ICD-10-CM | POA: Insufficient documentation

## 2012-12-20 MED ORDER — CARVEDILOL 3.125 MG PO TABS: 3.1250 mg | ORAL_TABLET | Freq: Two times a day (BID) | ORAL | Status: DC

## 2012-12-20 MED ORDER — OXYCODONE-ACETAMINOPHEN 5-325 MG PO TABS: 1.0000 | ORAL_TABLET | Freq: Four times a day (QID) | ORAL | Status: DC | PRN

## 2012-12-20 MED ORDER — GABAPENTIN 400 MG PO CAPS
800.0000 mg | ORAL_CAPSULE | Freq: Three times a day (TID) | ORAL | Status: DC
Start: 2012-12-20 — End: 2013-12-03

## 2012-12-20 MED ORDER — LISINOPRIL 5 MG PO TABS: 5.0000 mg | ORAL_TABLET | Freq: Every day | ORAL | Status: DC

## 2012-12-20 MED ORDER — ROSUVASTATIN CALCIUM 20 MG PO TABS: 20.0000 mg | ORAL_TABLET | Freq: Every day | ORAL | Status: DC

## 2012-12-23 ENCOUNTER — Ambulatory Visit (INDEPENDENT_AMBULATORY_CARE_PROVIDER_SITE_OTHER): Payer: Medicare HMO | Admitting: Cardiology

## 2012-12-23 ENCOUNTER — Encounter: Payer: Self-pay | Admitting: Cardiology

## 2012-12-23 VITALS — BP 132/70 | HR 76 | Ht 62.0 in | Wt 155.0 lb

## 2012-12-23 DIAGNOSIS — F17200 Nicotine dependence, unspecified, uncomplicated: Secondary | ICD-10-CM

## 2012-12-23 DIAGNOSIS — I10 Essential (primary) hypertension: Secondary | ICD-10-CM

## 2012-12-23 DIAGNOSIS — E7800 Pure hypercholesterolemia, unspecified: Secondary | ICD-10-CM

## 2012-12-23 DIAGNOSIS — R0989 Other specified symptoms and signs involving the circulatory and respiratory systems: Secondary | ICD-10-CM | POA: Insufficient documentation

## 2012-12-23 DIAGNOSIS — R221 Localized swelling, mass and lump, neck: Secondary | ICD-10-CM

## 2012-12-23 DIAGNOSIS — I2510 Atherosclerotic heart disease of native coronary artery without angina pectoris: Secondary | ICD-10-CM

## 2012-12-23 DIAGNOSIS — R220 Localized swelling, mass and lump, head: Secondary | ICD-10-CM

## 2012-12-23 NOTE — Assessment & Plan Note (Signed)
The patient is to have ultrasound of the neck to assess a nodule in her neck. This is not the same study as the carotid Doppler that she needs at some point.

## 2012-12-23 NOTE — Assessment & Plan Note (Signed)
Patient underwent CABG 2008. Her EKG is abnormal but unchanged. She's not having any marked symptoms. Guidelines would recommend exercise testing at this time. This will be scheduled.

## 2012-12-23 NOTE — Progress Notes (Signed)
HPI  The patient is seen to follow her cardiac status. Her physician previously was Dr.Stuckey, who has retired. I have taken over her cardiology care at this point. I have reviewed the prior records. The patient underwent CABG in 2008. She has done well. She saw Dr. Riley Kill last December, 2012. He recommended an echo at that time. He was done January, 2013. Ejection fraction was 55-60% with no significant wall motion abnormalities. He also recommended a stress test. The patient has had some orthopedic injuries over time and there has not been a good opportunity proceed with testing.  She is not having any significant chest pain. She is taking appropriate medications.  The patient mentions that she has had a small nodule in her right neck that is being assessed by her primary physician. She is to have an ultrasound.  Allergies  Allergen Reactions  . Ibuprofen Swelling and Rash  . Prednisone Swelling and Rash  . Morphine     REACTION: Nausea  . Coconut Oil Swelling and Rash    Current Outpatient Prescriptions  Medication Sig Dispense Refill  . aspirin 81 MG tablet Take 81 mg by mouth 2 (two) times daily.       . carvedilol (COREG) 3.125 MG tablet Take 1 tablet (3.125 mg total) by mouth 2 (two) times daily with a meal.  60 tablet  3  . DULoxetine (CYMBALTA) 60 MG capsule Take 60 mg by mouth daily.        . fish oil-omega-3 fatty acids 1000 MG capsule Take 2 g by mouth daily.        Marland Kitchen gabapentin (NEURONTIN) 400 MG capsule Take 2 capsules (800 mg total) by mouth 3 (three) times daily.  180 capsule  2  . lisinopril (PRINIVIL,ZESTRIL) 5 MG tablet Take 1 tablet (5 mg total) by mouth daily.  30 tablet  3  . oxyCODONE-acetaminophen (PERCOCET/ROXICET) 5-325 MG per tablet Take 1-2 tablets by mouth every 6 (six) hours as needed for pain. May fill on or after 03/10/13  150 tablet  0  . QUEtiapine (SEROQUEL XR) 300 MG 24 hr tablet Take 300 mg by mouth at bedtime. 300 mg + 50 mg=350 mg      .  QUEtiapine (SEROQUEL XR) 50 MG TB24 Take 50 mg by mouth at bedtime. 50 mg + 300 mg=350 mg      . ranitidine (ZANTAC) 150 MG tablet Take 1 tablet (150 mg total) by mouth 2 (two) times daily.  180 tablet  1  . rosuvastatin (CRESTOR) 20 MG tablet Take 1 tablet (20 mg total) by mouth at bedtime.  90 tablet  1   No current facility-administered medications for this visit.    History   Social History  . Marital Status: Married    Spouse Name: N/A    Number of Children: N/A  . Years of Education: N/A   Occupational History  . Not on file.   Social History Main Topics  . Smoking status: Current Every Day Smoker -- 0.50 packs/day  . Smokeless tobacco: Never Used  . Alcohol Use: No  . Drug Use: Not on file  . Sexual Activity: Not on file   Other Topics Concern  . Not on file   Social History Narrative  . No narrative on file    Family History  Problem Relation Age of Onset  . Heart disease Mother   . Hyperlipidemia Mother   . Hypertension Mother   . Heart disease Father   . Stroke  Father   . Hypertension Father   . Hyperlipidemia Father   . Hypertension Sister   . Hypertension Brother   . Cancer Daughter     unknown  . Hypertension Maternal Grandmother   . Hypertension Maternal Grandfather   . Cancer Maternal Grandfather   . Hypertension Paternal Grandmother   . Hypertension Paternal Grandfather   . Cancer Maternal Aunt     breast and ovarian  . Cancer Paternal Aunt     breast    Past Medical History  Diagnosis Date  . Back pain, chronic   . Depression   . Myocardial infarction 11/20/06  . Hypertension   . Hypercholesteremia   . Coronary artery disease   . Allergy   . Arthritis   . Neuromuscular disorder   . Bronchitis     hx-no inhalers at home  . Diabetes mellitus     diet controlled  . DDD (degenerative disc disease), lumbar   . Ejection fraction     .  Marland Kitchen Hx of CABG     Past Surgical History  Procedure Laterality Date  . Mandible surgery  2008    . Knee surgery  2009    lt   . Tubal ligation    . Breast surgery      rt br cystx2  . Abdominal hysterectomy  1984  . Coronary artery bypass graft  2008  . Orif ankle fracture Right 07/12/2012    Procedure: OPEN REDUCTION INTERNAL FIXATION (ORIF) RIGHT ANKLE FRACTURE;  Surgeon: Harvie Junior, MD;  Location: [facility];  Service: Orthopedics;  Laterality: Right;    Patient Active Problem List   Diagnosis Date Noted  . Depression   . Coronary artery disease   . Hypertension   . Hypercholesteremia   . Neuromuscular disorder   . Arthritis   . DDD (degenerative disc disease), lumbar   . Ejection fraction   . Hx of CABG   . Glucose intolerance (pre-diabetes) 02/08/2012  . Encounter for long-term (current) use of other medications 02/08/2012  . Spinal stenosis of lumbar region 02/08/2012  . Neuropathy 02/08/2012  . Superficial phlebitis and thrombophlebitis of the leg 04/09/2011  . Back pain, chronic   . TOBACCO ABUSE 11/20/2008    ROS   Patient denies fever, chills, headache, sweats, rash, change in vision, change in hearing, chest pain, cough, nausea vomiting, urinary symptoms. All other systems are reviewed and are negative.  PHYSICAL EXAM  Patient is oriented to person time and place. Affect is normal. There is no jugulovenous distention. Lungs are clear. Respiratory effort is nonlabored. Cardiac exam reveals S1 and S2. There no clicks or significant murmurs. There is a soft right carotid bruit. The abdomen is soft. There is no peripheral edema. The area of her right ankle that was injured is healing. There no skin rashes.  Filed Vitals:   12/23/12 1413  BP: 132/70  Pulse: 76  Height: 5\' 2"  (1.575 m)  Weight: 155 lb (70.308 kg)   EKG is done today and reviewed by me. There sinus rhythm. There are old infralateral Q waves. There is no significant change from the prior tracing.  ASSESSMENT & PLAN

## 2012-12-23 NOTE — Patient Instructions (Addendum)
Your physician has requested that you have a lexiscan myoview. For further information please visit [email or web address] Please follow instruction sheet, as given.  Your physician has requested that you have a carotid duplex. This test is an ultrasound of the carotid arteries in your neck. It looks at blood flow through these arteries that supply the brain with blood. Allow one hour for this exam. There are no restrictions or special instructions.  Your physician wants you to follow-up in: 1 year. You will receive a reminder letter in the mail two months in advance. If you don't receive a letter, please call our office to schedule the follow-up appointment.

## 2012-12-23 NOTE — Assessment & Plan Note (Signed)
There is a right carotid bruit. The patient has known coronary disease. I cannot document that a Doppler has been done since her cardiac surgery in 2008. Carotid Doppler will be done.

## 2012-12-23 NOTE — Assessment & Plan Note (Signed)
Patient is on appropriate medication for her lipids.

## 2012-12-23 NOTE — Assessment & Plan Note (Signed)
Blood pressures controlled. No change in therapy. 

## 2012-12-23 NOTE — Assessment & Plan Note (Signed)
The patient is counseled to stop smoking.

## 2012-12-24 ENCOUNTER — Encounter: Payer: Self-pay | Admitting: *Deleted

## 2012-12-26 ENCOUNTER — Ambulatory Visit
Admission: RE | Admit: 2012-12-26 | Discharge: 2012-12-26 | Disposition: A | Discharge status: Home / Self Care | Payer: Medicare HMO | Source: Ambulatory Visit | Attending: Family Medicine | Admitting: Family Medicine

## 2012-12-26 ENCOUNTER — Encounter (INDEPENDENT_AMBULATORY_CARE_PROVIDER_SITE_OTHER): Payer: Medicare HMO

## 2012-12-26 DIAGNOSIS — R0989 Other specified symptoms and signs involving the circulatory and respiratory systems: Secondary | ICD-10-CM

## 2012-12-31 ENCOUNTER — Telehealth: Payer: Self-pay

## 2012-12-31 ENCOUNTER — Encounter: Payer: Self-pay | Admitting: Cardiology

## 2012-12-31 ENCOUNTER — Other Ambulatory Visit: Payer: Self-pay | Admitting: Radiology

## 2012-12-31 DIAGNOSIS — E049 Nontoxic goiter, unspecified: Secondary | ICD-10-CM

## 2012-12-31 NOTE — Telephone Encounter (Signed)
Pt called wanting to know her ultrasound results. Please call [phone]

## 2013-01-01 NOTE — Progress Notes (Signed)
Thank you for putting in those orders - they are ordered correctly.  Please make sure the imaging center knows we need a dedicated thyroid study. I sent her initially for an US of the neck soft tissues and they note that the thyroid is incidentally noticed and recommend a dedicated thyroid US but when you order a thyroid US the only option is the exact same thing I had ordered initially - an US of the neck soft tissues. Marland Kitchen Marland Kitchen

## 2013-01-01 NOTE — Telephone Encounter (Signed)
Patient was advised of report yesterday.

## 2013-01-02 ENCOUNTER — Other Ambulatory Visit: Payer: Self-pay | Admitting: Family Medicine

## 2013-01-02 ENCOUNTER — Other Ambulatory Visit: Payer: Medicare HMO

## 2013-01-02 ENCOUNTER — Ambulatory Visit
Admission: RE | Admit: 2013-01-02 | Discharge: 2013-01-02 | Disposition: A | Discharge status: Home / Self Care | Payer: Commercial Managed Care - HMO | Source: Ambulatory Visit | Attending: Family Medicine | Admitting: Family Medicine

## 2013-01-02 ENCOUNTER — Ambulatory Visit (HOSPITAL_COMMUNITY): Payer: Medicare HMO | Attending: Cardiology | Admitting: Radiology

## 2013-01-02 VITALS — BP 142/80 | HR 55 | Ht 62.0 in | Wt 156.0 lb

## 2013-01-02 DIAGNOSIS — F17200 Nicotine dependence, unspecified, uncomplicated: Secondary | ICD-10-CM | POA: Insufficient documentation

## 2013-01-02 DIAGNOSIS — Z951 Presence of aortocoronary bypass graft: Secondary | ICD-10-CM | POA: Diagnosis not present

## 2013-01-02 DIAGNOSIS — I2510 Atherosclerotic heart disease of native coronary artery without angina pectoris: Secondary | ICD-10-CM | POA: Diagnosis present

## 2013-01-02 DIAGNOSIS — Z8249 Family history of ischemic heart disease and other diseases of the circulatory system: Secondary | ICD-10-CM | POA: Diagnosis not present

## 2013-01-02 DIAGNOSIS — E785 Hyperlipidemia, unspecified: Secondary | ICD-10-CM | POA: Diagnosis not present

## 2013-01-02 DIAGNOSIS — E049 Nontoxic goiter, unspecified: Secondary | ICD-10-CM

## 2013-01-02 DIAGNOSIS — I252 Old myocardial infarction: Secondary | ICD-10-CM | POA: Insufficient documentation

## 2013-01-02 DIAGNOSIS — I10 Essential (primary) hypertension: Secondary | ICD-10-CM | POA: Diagnosis not present

## 2013-01-02 DIAGNOSIS — E041 Nontoxic single thyroid nodule: Secondary | ICD-10-CM

## 2013-01-02 MED ORDER — TECHNETIUM TC 99M SESTAMIBI GENERIC - CARDIOLITE
10.0000 | Freq: Once | INTRAVENOUS | Status: AC | PRN
  Administered 2013-01-02: 10 via INTRAVENOUS

## 2013-01-02 MED ORDER — TECHNETIUM TC 99M SESTAMIBI GENERIC - CARDIOLITE
30.0000 | Freq: Once | INTRAVENOUS | Status: AC | PRN
  Administered 2013-01-02: 30 via INTRAVENOUS

## 2013-01-02 MED ORDER — REGADENOSON 0.4 MG/5ML IV SOLN
0.4000 mg | Freq: Once | INTRAVENOUS | Status: AC
  Administered 2013-01-02: 0.4 mg via INTRAVENOUS

## 2013-01-02 NOTE — Progress Notes (Signed)
[facility] NUCLEAR MED [address] [place], [place] [address] [phone]    Cardiology Nuclear Med Study  [name] is a 52 y.o. female     MRN : [id number]     DOB: [birth year]  Procedure Date: 01/02/2013  Nuclear Med Background Indication for Stress Test:  Evaluation for Ischemia and Graft Patency History:  '08 MI>Stent-RCA, EF=45%>CABG; '13 Echo:EF=60% Cardiac Risk Factors: Family History - CAD, Hypertension, Lipids and Smoker  Symptoms:  No cardiac complaints.   Nuclear Pre-Procedure Caffeine/Decaff Intake:  None NPO After: 2 pm   Lungs:  Clear. O2 Sat: 94% on room air. IV 0.9% NS with Angio Cath:  22g  IV Site: R Antecubital  IV Started by:  Bonnita Levan, RN  Chest Size (in):  40 Cup Size: DD  Height: 5\' 2"  (1.575 m)  Weight:  156 lb (70.761 kg)  BMI:  Body mass index is 28.53 kg/(m^2). Tech Comments:  Coreg not held per protocol.    Nuclear Med Study 1 or 2 day study: 1 day  Stress Test Type:  Eugenie Birks  Reading MD: Tobias Alexander, MD  Order Authorizing Provider:  Willa Rough, MD  Resting Radionuclide: Technetium 39m Sestamibi  Resting Radionuclide Dose: 11.0 mCi   Stress Radionuclide:  Technetium 36m Sestamibi  Stress Radionuclide Dose: 33.0 mCi           Stress Protocol Rest HR: 55 Stress HR: 87  Rest BP: 142/80 Stress BP: 133/74  Exercise Time (min): 2:00 METS: n/a   Predicted Max HR: 169 bpm % Max HR: 51.48 bpm Rate Pressure Product: 01027   Dose of Adenosine (mg):  n/a Dose of Lexiscan: 0.4 mg  Dose of Atropine (mg): n/a Dose of Dobutamine: n/a mcg/kg/min (at max HR)  Stress Test Technologist: Smiley Houseman, CMA-N  Nuclear Technologist:  Harlow Asa, CNMT     Rest Procedure:  Myocardial perfusion imaging was performed at rest 45 minutes following the intravenous administration of Technetium 13m Sestamibi.  Rest ECG: NSR, possible prior anterior infarct, no ST-T wave changes  Stress Procedure:  The patient  received IV Lexiscan 0.4 mg over 15-seconds with concurrent low level exercise and then Technetium 38m Sestamibi was injected at 30-seconds while the patient continued walking one more minute.  She c/o chest fullness and lightheadedness with Lexiscan.  Quantitative spect images were obtained after a 45-minute delay.  Stress ECG: No significant change from baseline ECG  QPS Raw Data Images:  There is no interference from nuclear activity from structures below the diaphragm.   Stress Images:  There is decreased uptake in the apex and apical anterior wall. Rest Images:  There is decreased uptake in the apex and apical anterior wall. Subtraction (SDS):  There is a small, moderate intensity non-reversibible defect in the distal anterior wall and in the apex that is most consistent with a previous infarction. Transient Ischemic Dilatation (Normal <1.22):  n/a Lung/Heart Ratio (Normal <0.45):  0.42  Quantitative Gated Spect Images QGS EDV:  87 ml QGS ESV:  26 ml  Impression Exercise Capacity:  Lexiscan with low level exercise. BP Response:  Normal blood pressure response. Clinical Symptoms:  No significant symptoms noted. ECG Impression:  No significant ST segment change suggestive of ischemia. Comparison with Prior Nuclear Study: No previous nuclear study performed  Overall Impression:   1. A small fixed defect in the distal anterior wall that most likely represents prior infarction in the distal LAD.  2. There is no reversible  defect. 3. Good LV  Ejection fraction of 70% with hypokinesis of the apical anterior wall.  4. This is a low risk nuclear scan.   Tobias Alexander, Rexene Edison, MD  01/02/2013

## 2013-01-03 ENCOUNTER — Encounter: Payer: Self-pay | Admitting: Cardiology

## 2013-01-07 ENCOUNTER — Telehealth: Payer: Self-pay | Admitting: Cardiology

## 2013-01-07 ENCOUNTER — Inpatient Hospital Stay: Admission: RE | Admit: 2013-01-07 | Payer: Medicare HMO | Source: Ambulatory Visit

## 2013-01-07 ENCOUNTER — Other Ambulatory Visit (HOSPITAL_COMMUNITY)
Admission: RE | Admit: 2013-01-07 | Discharge: 2013-01-07 | Disposition: A | Discharge status: Home / Self Care | Payer: Medicare HMO | Source: Ambulatory Visit | Attending: Interventional Radiology | Admitting: Interventional Radiology

## 2013-01-07 ENCOUNTER — Ambulatory Visit
Admission: RE | Admit: 2013-01-07 | Discharge: 2013-01-07 | Disposition: A | Discharge status: Home / Self Care | Payer: Commercial Managed Care - HMO | Source: Ambulatory Visit | Attending: Family Medicine | Admitting: Family Medicine

## 2013-01-07 DIAGNOSIS — E041 Nontoxic single thyroid nodule: Secondary | ICD-10-CM

## 2013-01-07 NOTE — Telephone Encounter (Signed)
Follow Up  Pt returning call about results.

## 2013-01-10 ENCOUNTER — Ambulatory Visit (INDEPENDENT_AMBULATORY_CARE_PROVIDER_SITE_OTHER): Payer: Medicare HMO | Admitting: Endocrinology

## 2013-01-10 ENCOUNTER — Encounter: Payer: Self-pay | Admitting: Endocrinology

## 2013-01-10 VITALS — BP 132/80 | HR 70 | Ht 61.0 in | Wt 156.0 lb

## 2013-01-10 DIAGNOSIS — E042 Nontoxic multinodular goiter: Secondary | ICD-10-CM

## 2013-01-10 NOTE — Patient Instructions (Addendum)
Please come back for a follow-up appointment in 6 months When you came back, you will due to a repeat ultrasound and blood test.

## 2013-01-10 NOTE — Progress Notes (Signed)
Subjective:    Patient ID: [name], female    DOB: 12/27/1960, 52 y.o.   MRN: [id number]  HPI Pt says 1 month ago, she noted slight swelling at the anterior neck, but no assoc pain. Past Medical History  Diagnosis Date  . Back pain, chronic   . Depression   . Myocardial infarction 11/20/06  . Hypertension   . Hypercholesteremia   . Coronary artery disease   . Allergy   . Arthritis   . Neuromuscular disorder   . Bronchitis     hx-no inhalers at home  . Diabetes mellitus     diet controlled  . DDD (degenerative disc disease), lumbar   . Ejection fraction     .  Marland Kitchen Hx of CABG   . Carotid bruit   . Nodule of neck   . Multiple thyroid nodules     Past Surgical History  Procedure Laterality Date  . Mandible surgery  2008  . Knee surgery  2009    lt   . Tubal ligation    . Breast surgery      rt br cystx2  . Abdominal hysterectomy  1984  . Coronary artery bypass graft  2008  . Orif ankle fracture Right 07/12/2012    Procedure: OPEN REDUCTION INTERNAL FIXATION (ORIF) RIGHT ANKLE FRACTURE;  Surgeon: Harvie Junior, MD;  Location: [facility];  Service: Orthopedics;  Laterality: Right;    History   Social History  . Marital Status: Married    Spouse Name: N/A    Number of Children: N/A  . Years of Education: N/A   Occupational History  . Not on file.   Social History Main Topics  . Smoking status: Current Every Day Smoker -- 0.50 packs/day  . Smokeless tobacco: Never Used  . Alcohol Use: No  . Drug Use: Not on file  . Sexual Activity: Not on file   Other Topics Concern  . Not on file   Social History Narrative  . No narrative on file    Current Outpatient Prescriptions on File Prior to Visit  Medication Sig Dispense Refill  . aspirin 81 MG tablet Take 81 mg by mouth 2 (two) times daily.       . carvedilol (COREG) 3.125 MG tablet Take 1 tablet (3.125 mg total) by mouth 2 (two) times daily with a meal.  60 tablet  3  .  DULoxetine (CYMBALTA) 60 MG capsule Take 60 mg by mouth daily.        . fish oil-omega-3 fatty acids 1000 MG capsule Take 2 g by mouth daily.        Marland Kitchen gabapentin (NEURONTIN) 400 MG capsule Take 2 capsules (800 mg total) by mouth 3 (three) times daily.  180 capsule  2  . lisinopril (PRINIVIL,ZESTRIL) 5 MG tablet Take 1 tablet (5 mg total) by mouth daily.  30 tablet  3  . oxyCODONE-acetaminophen (PERCOCET/ROXICET) 5-325 MG per tablet Take 1-2 tablets by mouth every 6 (six) hours as needed for pain. May fill on or after 03/10/13  150 tablet  0  . QUEtiapine (SEROQUEL XR) 300 MG 24 hr tablet Take 300 mg by mouth at bedtime. 300 mg + 50 mg=350 mg      . QUEtiapine (SEROQUEL XR) 50 MG TB24 Take 50 mg by mouth at bedtime. 50 mg + 300 mg=350 mg      . ranitidine (ZANTAC) 150 MG tablet Take 1 tablet (150 mg total) by mouth 2 (two)  times daily.  180 tablet  1  . rosuvastatin (CRESTOR) 20 MG tablet Take 1 tablet (20 mg total) by mouth at bedtime.  90 tablet  1   No current facility-administered medications on file prior to visit.    Allergies  Allergen Reactions  . Ibuprofen Swelling and Rash  . Prednisone Swelling and Rash  . Morphine     REACTION: Nausea  . Coconut Oil Swelling and Rash    Family History  Problem Relation Age of Onset  . Heart disease Mother   . Hyperlipidemia Mother   . Hypertension Mother   . Heart disease Father   . Stroke Father   . Hypertension Father   . Hyperlipidemia Father   . Hypertension Sister   . Hypertension Brother   . Cancer Daughter     unknown  . Hypertension Maternal Grandmother   . Hypertension Maternal Grandfather   . Cancer Maternal Grandfather   . Hypertension Paternal Grandmother   . Hypertension Paternal Grandfather   . Cancer Maternal Aunt     breast and ovarian  . Cancer Paternal Aunt     breast  pt says dtr had radiation-associated benign goiter.  BP 132/80  Pulse 70  Ht 5\' 1"  (1.549 m)  Wt 156 lb (70.761 kg)  BMI 29.49 kg/m2   SpO2 96%  Review of Systems denies headache, hoarseness, double vision, palpitations, sob, diarrhea, myalgias, and tremor.  She reports weight gain, easy bruising, menopausal sxs, anxiety, rhinorrhea, acral numbness, excessive diaphoresis, and nocturia.    Objective:   Physical Exam VS: see vs page GEN: no distress HEAD: head: no deformity eyes: no periorbital swelling, no proptosis external nose and ears are normal mouth: no lesion seen NECK: supple, thyroid is not enlarged CHEST WALL: no deformity LUNGS:  Clear to auscultation CV: reg rate and rhythm, no murmur ABD: abdomen is soft, nontender.  no hepatosplenomegaly.  not distended.  no hernia.   MUSCULOSKELETAL: muscle bulk and strength are grossly normal.  no obvious joint swelling.  gait is normal and steady EXTEMITIES: no deformity.  no ulcer on the feet.  feet are of normal color and temp.   PULSES: dorsalis pedis intact bilat.  no carotid bruit NEURO:  cn 2-12 grossly intact.   readily moves all 4's.  sensation is intact to touch on the feet SKIN:  Normal texture and temperature.  No rash or suspicious lesion is visible.  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   NODES:  None palpable at the neck PSYCH: alert, oriented x3.  Does not appear anxious nor depressed.  (i reviewed thyroid US and cytol results) Lab Results  Component Value Date   TSH 0.589 12/08/2012      Assessment & Plan:  Multinodular goiter: low risk of malignancy. 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 Weight gain: not thyroid-related.

## 2013-01-31 ENCOUNTER — Ambulatory Visit (INDEPENDENT_AMBULATORY_CARE_PROVIDER_SITE_OTHER): Payer: Medicare PPO | Admitting: Family Medicine

## 2013-01-31 ENCOUNTER — Encounter: Payer: Self-pay | Admitting: Family Medicine

## 2013-01-31 VITALS — BP 124/62 | HR 68 | Temp 98.5°F | Resp 16 | Ht 63.0 in | Wt 158.0 lb

## 2013-01-31 DIAGNOSIS — M48061 Spinal stenosis, lumbar region without neurogenic claudication: Secondary | ICD-10-CM

## 2013-01-31 DIAGNOSIS — Z23 Encounter for immunization: Secondary | ICD-10-CM

## 2013-01-31 DIAGNOSIS — I2510 Atherosclerotic heart disease of native coronary artery without angina pectoris: Secondary | ICD-10-CM

## 2013-01-31 DIAGNOSIS — G8929 Other chronic pain: Secondary | ICD-10-CM

## 2013-01-31 DIAGNOSIS — M549 Dorsalgia, unspecified: Secondary | ICD-10-CM

## 2013-01-31 DIAGNOSIS — G4700 Insomnia, unspecified: Secondary | ICD-10-CM

## 2013-01-31 DIAGNOSIS — E042 Nontoxic multinodular goiter: Secondary | ICD-10-CM

## 2013-01-31 MED ORDER — OXYCODONE-ACETAMINOPHEN 5-325 MG PO TABS: 1.0000 | ORAL_TABLET | Freq: Four times a day (QID) | ORAL | Status: DC | PRN

## 2013-01-31 MED ORDER — TRAZODONE HCL 50 MG PO TABS: 50.0000 mg | ORAL_TABLET | Freq: Every evening | ORAL | Status: DC | PRN

## 2013-01-31 NOTE — Patient Instructions (Signed)
Make sure you come back to see me in mid-January and just come to the walk in clinic at 102 so that we can see about taking out that stitch in your ankle if it is still bothering you.  It if not bothering you, then schedule to see me at 104 to make sure there isn't to much of a wait.

## 2013-02-05 NOTE — Progress Notes (Signed)
Subjective:    Patient ID: [name], female    DOB: Sep 14, 1960, 52 y.o.   MRN: [id number] Chief Complaint  Patient presents with  . Medication Refill    percocet    HPI  I gave pt rx for Oct and Nov but pharmacy told her that she didn't so came in for a refill today even though she thought it was a little early.  Stitch from left ankle plate still really bothering her - wants to pop it out on her own.  Past Medical History  Diagnosis Date  . Back pain, chronic   . Depression   . Myocardial infarction 11/20/06  . Hypertension   . Hypercholesteremia   . Coronary artery disease   . Allergy   . Arthritis   . Neuromuscular disorder   . Bronchitis     hx-no inhalers at home  . Diabetes mellitus     diet controlled  . DDD (degenerative disc disease), lumbar   . Ejection fraction     .  Marland Kitchen Hx of CABG   . Carotid bruit   . Nodule of neck   . Multiple thyroid nodules    Current Outpatient Prescriptions on File Prior to Visit  Medication Sig Dispense Refill  . aspirin 81 MG tablet Take 81 mg by mouth 2 (two) times daily.       . Biotin 5 MG CAPS Take by mouth.      . carvedilol (COREG) 3.125 MG tablet Take 1 tablet (3.125 mg total) by mouth 2 (two) times daily with a meal.  60 tablet  3  . DULoxetine (CYMBALTA) 60 MG capsule Take 60 mg by mouth daily.        Marland Kitchen gabapentin (NEURONTIN) 400 MG capsule Take 2 capsules (800 mg total) by mouth 3 (three) times daily.  180 capsule  2  . lisinopril (PRINIVIL,ZESTRIL) 5 MG tablet Take 1 tablet (5 mg total) by mouth daily.  30 tablet  3  . QUEtiapine (SEROQUEL XR) 300 MG 24 hr tablet Take 300 mg by mouth at bedtime. 300 mg + 50 mg=350 mg      . QUEtiapine (SEROQUEL XR) 50 MG TB24 Take 50 mg by mouth at bedtime. 50 mg + 300 mg=350 mg      . ranitidine (ZANTAC) 150 MG tablet Take 1 tablet (150 mg total) by mouth 2 (two) times daily.  180 tablet  1  . rosuvastatin (CRESTOR) 20 MG tablet Take 1 tablet (20 mg total) by mouth at  bedtime.  90 tablet  1  . fish oil-omega-3 fatty acids 1000 MG capsule Take 2 g by mouth daily.         No current facility-administered medications on file prior to visit.   Allergies  Allergen Reactions  . Ibuprofen Swelling and Rash  . Prednisone Swelling and Rash  . Morphine     REACTION: Nausea  . Coconut Oil Swelling and Rash     Review of Systems  Constitutional: Negative for fever and chills.  Gastrointestinal: Negative for nausea, vomiting, abdominal pain, diarrhea and constipation.  Genitourinary: Negative for urgency, frequency, decreased urine volume and difficulty urinating.  Musculoskeletal: Positive for arthralgias, back pain, gait problem, joint swelling and myalgias.  Neurological: Negative for weakness and numbness.  Hematological: Negative for adenopathy. Does not bruise/bleed easily.  Psychiatric/Behavioral: Positive for sleep disturbance. Negative for dysphoric mood. The patient is not nervous/anxious.       BP 124/62  Pulse 68  Temp(Src) 98.5  F (36.9 C) (Oral)  Resp 16  Ht 5\' 3"  (1.6 m)  Wt 158 lb (71.668 kg)  BMI 28 kg/m2  SpO2 92% Objective:   Physical Exam  Constitutional: She is oriented to person, place, and time. She appears well-developed and well-nourished. No distress.  HENT:  Head: Normocephalic and atraumatic.  Right Ear: External ear normal.  Left Ear: External ear normal.  Eyes: Conjunctivae are normal. No scleral icterus.  Neck: Normal range of motion. Neck supple. No thyromegaly present.  Cardiovascular: Normal rate, regular rhythm, normal heart sounds and intact distal pulses.   Pulmonary/Chest: Effort normal and breath sounds normal. No respiratory distress.  Musculoskeletal: She exhibits no edema.  Lymphadenopathy:    She has no cervical adenopathy.  Neurological: She is alert and oriented to person, place, and time.  Skin: Skin is warm and dry. She is not diaphoretic. No erythema.  Psychiatric: She has a normal mood and  affect. Her behavior is normal.          Assessment & Plan:  Need for prophylactic vaccination and inoculation against influenza - Plan: Flu Vaccine QUAD 36+ mos IM  Insomnia  Spinal stenosis of lumbar region  Back pain, chronic - Reprinted prior rx. Pt to f/u in 3 months for additional refills - check [facility] again at that time though appropriate to date.  Multiple thyroid nodules - benign.  Going to have repeat US and endocrine recheck in 6 mos but likely no intervention needed.  Coronary artery disease - cardiology eval and NM stress test went beautifully - all came back as low risk.  Meds ordered this encounter  Medications  . traZODone (DESYREL) 50 MG tablet    Sig: Take 1 tablet (50 mg total) by mouth at bedtime as needed for sleep.    Dispense:  30 tablet    Refill:  3  . DISCONTD: oxyCODONE-acetaminophen (PERCOCET/ROXICET) 5-325 MG per tablet    Sig: Take 1-2 tablets by mouth every 6 (six) hours as needed for pain. May fill on or after 03/10/13    Dispense:  150 tablet    Refill:  0  . DISCONTD: oxyCODONE-acetaminophen (PERCOCET/ROXICET) 5-325 MG per tablet    Sig: Take 1-2 tablets by mouth every 6 (six) hours as needed for pain. May fill on or after 02/07/13    Dispense:  150 tablet    Refill:  0  . DISCONTD: oxyCODONE-acetaminophen (PERCOCET/ROXICET) 5-325 MG per tablet    Sig: Take 1-2 tablets by mouth every 6 (six) hours as needed for pain. May fill on or after 04/09/13    Dispense:  150 tablet    Refill:  0  . oxyCODONE-acetaminophen (PERCOCET/ROXICET) 5-325 MG per tablet    Sig: Take 1-2 tablets by mouth every 6 (six) hours as needed for pain. May fill on or after 05/10/2013    Dispense:  150 tablet    Refill:  0

## 2013-03-04 ENCOUNTER — Telehealth: Payer: Self-pay

## 2013-03-04 NOTE — Telephone Encounter (Signed)
[facility] reported that when they filled Rx for trazadone written by Dr Clelia Croft on 01/31/13, they filled it w/the 100 mg instead of 50 mg in error. They are RFing today and have RFd w/correct 50 mg strength and will advise pt of issue when she p/up RF. Dr Clelia Croft, [place].

## 2013-03-04 NOTE — Telephone Encounter (Signed)
noted 

## 2013-03-05 ENCOUNTER — Other Ambulatory Visit: Payer: Self-pay | Admitting: Family Medicine

## 2013-03-20 ENCOUNTER — Other Ambulatory Visit: Payer: Self-pay | Admitting: Family Medicine

## 2013-03-20 NOTE — Telephone Encounter (Signed)
Dr Clelia Croft, you recently saw pt for several chronic issues, but don't see discussion re: GERD or this Rx. You instr'd pt to RTC in Jan. Do you want to give her RFs until then or do you need to see her back now for this med? I've pended RFs for review.

## 2013-04-07 DIAGNOSIS — F3110 Bipolar disorder, current episode manic without psychotic features, unspecified: Secondary | ICD-10-CM | POA: Diagnosis not present

## 2013-04-07 DIAGNOSIS — F319 Bipolar disorder, unspecified: Secondary | ICD-10-CM | POA: Diagnosis not present

## 2013-04-15 ENCOUNTER — Ambulatory Visit (INDEPENDENT_AMBULATORY_CARE_PROVIDER_SITE_OTHER): Payer: Medicare PPO | Admitting: Family Medicine

## 2013-04-15 VITALS — BP 98/64 | HR 72 | Temp 97.7°F | Resp 18 | Ht 63.5 in | Wt 161.0 lb

## 2013-04-15 DIAGNOSIS — M62830 Muscle spasm of back: Secondary | ICD-10-CM

## 2013-04-15 DIAGNOSIS — J40 Bronchitis, not specified as acute or chronic: Secondary | ICD-10-CM

## 2013-04-15 DIAGNOSIS — F17200 Nicotine dependence, unspecified, uncomplicated: Secondary | ICD-10-CM

## 2013-04-15 DIAGNOSIS — M5380 Other specified dorsopathies, site unspecified: Secondary | ICD-10-CM

## 2013-04-15 MED ORDER — HYDROCODONE-HOMATROPINE 5-1.5 MG/5ML PO SYRP: 5.0000 mL | ORAL_SOLUTION | ORAL | Status: DC | PRN

## 2013-04-15 MED ORDER — METHOCARBAMOL 500 MG PO TABS: 500.0000 mg | ORAL_TABLET | Freq: Four times a day (QID) | ORAL | Status: DC

## 2013-04-15 MED ORDER — BENZONATATE 200 MG PO CAPS: 200.0000 mg | ORAL_CAPSULE | Freq: Three times a day (TID) | ORAL | Status: DC | PRN

## 2013-04-15 MED ORDER — CEFDINIR 300 MG PO CAPS: 600.0000 mg | ORAL_CAPSULE | Freq: Every day | ORAL | Status: DC

## 2013-04-15 NOTE — Progress Notes (Signed)
Subjective: Oh lady who has had an upper respiratory infection and bad cough since about Friday. She has coughed so much that is greatly aggravated her back problems. She has a history of lumbar spinal stenosis and disc disease, which apparently is inoperable for some reason, and has been on chronic pain medication for that. The spasms and pains have been intolerable. She is coughing day and night. She is not bringing up a lot of stuff. Has thick white mucus from her nose which doesn't want to blow out. She still smokes.  Objective: Obvious discomfort, walks with a cane, can barely move herself up and down. Her throat is slightly erythematous with some edema of the soft tissues and uvula. Neck supple without significant nodes but she does have moderate thyromegaly. The thyromegaly apparently is being evaluated. Her chest is clear on auscultation. Heart regular without murmurs. No rhonchi rales or wheezes were noted.  Assessment: Viral URI and bronchitis Secondary muscle spasms in back Chronic pain syndrome  Plan: Hycodan cough syrup Zithromax, although this probably is viral since she is a smoker her onset of a bacterial complication or increased. Robaxin for the muscle spasms Increase fluid intake to try and help keep the secretions thinner. Can also try some plain Mucinex in addition to the above cough syrup if needed. Return if worse or not improving

## 2013-04-15 NOTE — Patient Instructions (Addendum)
Hycodan cough syrup when needed for severe coughing. It will cause you some sedation. Use of the Tessalon (benzonatate) for milder cough Omnicef (Cefdinir), for infection. Robaxin for the muscle spasms Increase fluid intake to try and help keep the secretions thinner. Can also try some plain Mucinex in addition to the above cough syrup if needed. Return if worse or not improving

## 2013-06-03 ENCOUNTER — Other Ambulatory Visit: Payer: Self-pay | Admitting: Family Medicine

## 2013-06-18 ENCOUNTER — Other Ambulatory Visit: Payer: Self-pay | Admitting: Family Medicine

## 2013-07-07 ENCOUNTER — Other Ambulatory Visit: Payer: Self-pay | Admitting: Family Medicine

## 2013-07-11 ENCOUNTER — Encounter: Payer: Self-pay | Admitting: Endocrinology

## 2013-07-11 ENCOUNTER — Ambulatory Visit (INDEPENDENT_AMBULATORY_CARE_PROVIDER_SITE_OTHER): Payer: Medicare HMO | Admitting: Endocrinology

## 2013-07-11 VITALS — BP 126/70 | HR 73 | Temp 98.2°F | Ht 63.0 in | Wt 162.0 lb

## 2013-07-11 DIAGNOSIS — E042 Nontoxic multinodular goiter: Secondary | ICD-10-CM

## 2013-07-11 DIAGNOSIS — E0590 Thyrotoxicosis, unspecified without thyrotoxic crisis or storm: Secondary | ICD-10-CM

## 2013-07-11 LAB — T4, FREE: Free T4: 0.7 ng/dL (ref 0.60–1.60)

## 2013-07-11 LAB — TSH: TSH: 0.22 u[IU]/mL — AB (ref 0.35–5.50)

## 2013-07-11 NOTE — Progress Notes (Signed)
Subjective:    Patient ID: [name], female    DOB: 1961-04-22, 53 y.o.   MRN: [id number]  HPI Pt was found in mid-2014 to have a multinodular goiter.  It was found on [place], which was done to investigate lymphadenopathy of the neck.  The nodes appeared benign, but goiter was incidentally noted.  bx was c/w benign follicular nodule.  Chart review shows TSH was low in 2009, but not since.  Pt says she has never been on thyroid medication.   Past Medical History  Diagnosis Date  . Back pain, chronic   . Depression   . Myocardial infarction 11/20/06  . Hypertension   . Hypercholesteremia   . Coronary artery disease   . Allergy   . Arthritis   . Neuromuscular disorder   . Bronchitis     hx-no inhalers at home  . Diabetes mellitus     diet controlled  . DDD (degenerative disc disease), lumbar   . Ejection fraction     .  Marland Kitchen Hx of CABG   . Carotid bruit   . Nodule of neck   . Multiple thyroid nodules     Past Surgical History  Procedure Laterality Date  . Mandible surgery  2008  . Knee surgery  2009    lt   . Tubal ligation    . Breast surgery      rt br cystx2  . Abdominal hysterectomy  1984  . Coronary artery bypass graft  2008  . Orif ankle fracture Right 07/12/2012    Procedure: OPEN REDUCTION INTERNAL FIXATION (ORIF) RIGHT ANKLE FRACTURE;  Surgeon: Alta Corning, MD;  Location: [place];  Service: Orthopedics;  Laterality: Right;    History   Social History  . Marital Status: Married    Spouse Name: N/A    Number of Children: N/A  . Years of Education: N/A   Occupational History  . Not on file.   Social History Main Topics  . Smoking status: Current Every Day Smoker -- 0.50 packs/day  . Smokeless tobacco: Never Used  . Alcohol Use: No  . Drug Use: Not on file  . Sexual Activity: Not on file   Other Topics Concern  . Not on file   Social History Narrative  . No narrative on file    Current Outpatient Prescriptions on  File Prior to Visit  Medication Sig Dispense Refill  . ARIPiprazole (ABILIFY) 10 MG tablet Take 10 mg by mouth daily.      Marland Kitchen aspirin 81 MG tablet Take 81 mg by mouth 2 (two) times daily.       . benzonatate (TESSALON) 200 MG capsule Take 1 capsule (200 mg total) by mouth 3 (three) times daily as needed for cough.  30 capsule  0  . Biotin 5 MG CAPS Take by mouth.      . carvedilol (COREG) 3.125 MG tablet Take 1 tablet (3.125 mg total) by mouth 2 (two) times daily. PATIENT NEEDS OFFICE VISIT FOR ADDITIONAL REFILLS  60 tablet  0  . cefdinir (OMNICEF) 300 MG capsule Take 2 capsules (600 mg total) by mouth daily.  20 capsule  0  . DULoxetine (CYMBALTA) 60 MG capsule Take 60 mg by mouth daily.        . fish oil-omega-3 fatty acids 1000 MG capsule Take 2 g by mouth daily.        Marland Kitchen gabapentin (NEURONTIN) 400 MG capsule Take 2 capsules (800 mg total)  by mouth 3 (three) times daily.  180 capsule  2  . gabapentin (NEURONTIN) 400 MG capsule TAKE TWO CAPSULES THREE TIMES DAILY  180 capsule  5  . HYDROcodone-homatropine (HYCODAN) 5-1.5 MG/5ML syrup Take 5 mLs by mouth every 4 (four) hours as needed for cough.  120 mL  0  . lisinopril (PRINIVIL,ZESTRIL) 5 MG tablet Take 1 tablet (5 mg total) by mouth daily. PATIENT NEEDS OFFICE VISIT FOR ADDITIONAL REFILLS  30 tablet  0  . methocarbamol (ROBAXIN) 500 MG tablet Take 1 tablet (500 mg total) by mouth 4 (four) times daily.  40 tablet  0  . oxyCODONE-acetaminophen (PERCOCET/ROXICET) 5-325 MG per tablet Take 1-2 tablets by mouth every 6 (six) hours as needed for pain. May fill on or after 05/10/2013  150 tablet  0  . QUEtiapine (SEROQUEL XR) 300 MG 24 hr tablet Take 300 mg by mouth at bedtime. 300 mg + 50 mg=350 mg      . QUEtiapine (SEROQUEL XR) 50 MG TB24 Take 50 mg by mouth at bedtime. 50 mg + 300 mg=350 mg      . ranitidine (ZANTAC) 150 MG tablet Take 1 tablet (150 mg total) by mouth 2 (two) times daily.  180 tablet  1  . ranitidine (ZANTAC) 150 MG tablet Take 1  tablet (150 mg total) by mouth 2 (two) times daily. PATIENT NEEDS OFFICE VISIT FOR ADDITIONAL REFILLS  60 tablet  0  . rosuvastatin (CRESTOR) 20 MG tablet Take 1 tablet (20 mg total) by mouth daily. PATIENT NEEDS OFFICE VISIT FOR ADDITIONAL REFILLS  30 tablet  0  . traZODone (DESYREL) 50 MG tablet TAKE ONE TABLET EVERY NIGHT AT BEDTIME AS NEEDED FOR SLEEP  30 tablet  1   No current facility-administered medications on file prior to visit.    Allergies  Allergen Reactions  . Ibuprofen Swelling and Rash  . Prednisone Swelling and Rash  . Morphine     REACTION: Nausea  . Coconut Oil Swelling and Rash    Family History  Problem Relation Age of Onset  . Heart disease Mother   . Hyperlipidemia Mother   . Hypertension Mother   . Heart disease Father   . Stroke Father   . Hypertension Father   . Hyperlipidemia Father   . Hypertension Sister   . Hypertension Brother   . Cancer Daughter     unknown  . Hypertension Maternal Grandmother   . Hypertension Maternal Grandfather   . Cancer Maternal Grandfather   . Hypertension Paternal Grandmother   . Hypertension Paternal Grandfather   . Cancer Maternal Aunt     breast and ovarian  . Cancer Paternal Aunt     breast   BP 126/70  Pulse 73  Temp(Src) 98.2 F (36.8 C) (Oral)  Ht 5\' 3"  (1.6 m)  Wt 162 lb (73.483 kg)  BMI 28.70 kg/m2  SpO2 90%  Review of Systems Denies neck pain.    Objective:   Physical Exam VITAL SIGNS:  See vs page GENERAL: no distress Neck: 2-3 cm right thyroid nodule.  No palpable nodes.    Lab Results  Component Value Date   TSH 0.22* 07/11/2013      Assessment & Plan:  Multinodular goiter: clinically unchanged Hyperthyroidism: due to the goiter.

## 2013-07-11 NOTE — Patient Instructions (Addendum)
blood tests are being requested for you today.  We'll contact you with results. most of the time, a "lumpy thyroid" will eventually become overactive.  this is usually a slow process, happening over the span of many years. Please come back for a follow-up appointment in 6 months, when you'll be due for a follow-up ultrasound.

## 2013-07-17 ENCOUNTER — Ambulatory Visit (INDEPENDENT_AMBULATORY_CARE_PROVIDER_SITE_OTHER): Payer: Medicare PPO | Admitting: Emergency Medicine

## 2013-07-17 VITALS — BP 120/68 | HR 68 | Temp 98.2°F | Resp 16 | Ht 62.0 in | Wt 160.4 lb

## 2013-07-17 DIAGNOSIS — E041 Nontoxic single thyroid nodule: Secondary | ICD-10-CM

## 2013-07-17 DIAGNOSIS — M48061 Spinal stenosis, lumbar region without neurogenic claudication: Secondary | ICD-10-CM

## 2013-07-17 DIAGNOSIS — I2510 Atherosclerotic heart disease of native coronary artery without angina pectoris: Secondary | ICD-10-CM

## 2013-07-17 DIAGNOSIS — M62830 Muscle spasm of back: Secondary | ICD-10-CM

## 2013-07-17 DIAGNOSIS — G894 Chronic pain syndrome: Secondary | ICD-10-CM

## 2013-07-17 LAB — POCT UA - MICROSCOPIC ONLY
Casts, Ur, LPF, POC: NEGATIVE
Crystals, Ur, HPF, POC: NEGATIVE
MUCUS UA: NEGATIVE
WBC, UR, HPF, POC: NEGATIVE
Yeast, UA: NEGATIVE

## 2013-07-17 LAB — POCT URINALYSIS DIPSTICK
BILIRUBIN UA: NEGATIVE
GLUCOSE UA: NEGATIVE
Ketones, UA: NEGATIVE
Leukocytes, UA: NEGATIVE
NITRITE UA: NEGATIVE
Protein, UA: NEGATIVE
RBC UA: NEGATIVE
SPEC GRAV UA: 1.01
Urobilinogen, UA: 0.2
pH, UA: 5.5

## 2013-07-17 LAB — POCT CBC
Granulocyte percent: 50.7 %G (ref 37–80)
HEMATOCRIT: 48.2 % — AB (ref 37.7–47.9)
HEMOGLOBIN: 15.4 g/dL (ref 12.2–16.2)
LYMPH, POC: 3.9 — AB (ref 0.6–3.4)
MCH: 30 pg (ref 27–31.2)
MCHC: 32 g/dL (ref 31.8–35.4)
MCV: 94 fL (ref 80–97)
MID (cbc): 0.6 (ref 0–0.9)
MPV: 9.6 fL (ref 0–99.8)
POC GRANULOCYTE: 4.6 (ref 2–6.9)
POC LYMPH %: 42.9 % (ref 10–50)
POC MID %: 6.4 % (ref 0–12)
Platelet Count, POC: 229 10*3/uL (ref 142–424)
RBC: 5.13 M/uL (ref 4.04–5.48)
RDW, POC: 14.2 %
WBC: 9 10*3/uL (ref 4.6–10.2)

## 2013-07-17 MED ORDER — CARVEDILOL 3.125 MG PO TABS: 3.1250 mg | ORAL_TABLET | Freq: Two times a day (BID) | ORAL | Status: DC

## 2013-07-17 MED ORDER — RANITIDINE HCL 150 MG PO TABS
150.0000 mg | ORAL_TABLET | Freq: Two times a day (BID) | ORAL | Status: DC
Start: 2013-07-17 — End: 2013-12-03

## 2013-07-17 MED ORDER — TRAZODONE HCL 50 MG PO TABS: ORAL_TABLET | ORAL | Status: DC

## 2013-07-17 MED ORDER — ROSUVASTATIN CALCIUM 20 MG PO TABS
20.0000 mg | ORAL_TABLET | Freq: Every day | ORAL | Status: DC
Start: 2013-07-17 — End: 2013-07-18

## 2013-07-17 MED ORDER — GABAPENTIN 400 MG PO CAPS: ORAL_CAPSULE | ORAL | Status: DC

## 2013-07-17 MED ORDER — QUETIAPINE FUMARATE ER 300 MG PO TB24: 300.0000 mg | ORAL_TABLET | Freq: Every day | ORAL | Status: DC

## 2013-07-17 MED ORDER — DULOXETINE HCL 60 MG PO CPEP: 60.0000 mg | ORAL_CAPSULE | Freq: Every day | ORAL | Status: DC

## 2013-07-17 MED ORDER — METHOCARBAMOL 500 MG PO TABS: 500.0000 mg | ORAL_TABLET | Freq: Four times a day (QID) | ORAL | Status: DC

## 2013-07-17 MED ORDER — ARIPIPRAZOLE 10 MG PO TABS: 10.0000 mg | ORAL_TABLET | Freq: Every day | ORAL | Status: DC

## 2013-07-17 MED ORDER — OXYCODONE-ACETAMINOPHEN 5-325 MG PO TABS: 1.0000 | ORAL_TABLET | Freq: Four times a day (QID) | ORAL | Status: DC | PRN

## 2013-07-17 MED ORDER — QUETIAPINE FUMARATE ER 50 MG PO TB24: 50.0000 mg | ORAL_TABLET | Freq: Every day | ORAL | Status: DC

## 2013-07-17 NOTE — Patient Instructions (Signed)
Smoking Cessation Quitting smoking is important to your health and has many advantages. However, it is not always easy to quit since nicotine is a very addictive drug. Often times, people try 3 times or more before being able to quit. This document explains the best ways for you to prepare to quit smoking. Quitting takes hard work and a lot of effort, but you can do it. ADVANTAGES OF QUITTING SMOKING  You will live longer, feel better, and live better.  Your body will feel the impact of quitting smoking almost immediately.  Within 20 minutes, blood pressure decreases. Your pulse returns to its normal level.  After 8 hours, carbon monoxide levels in the blood return to normal. Your oxygen level increases.  After 24 hours, the chance of having a heart attack starts to decrease. Your breath, hair, and body stop smelling like smoke.  After 48 hours, damaged nerve endings begin to recover. Your sense of taste and smell improve.  After 72 hours, the body is virtually free of nicotine. Your bronchial tubes relax and breathing becomes easier.  After 2 to 12 weeks, lungs can hold more air. Exercise becomes easier and circulation improves.  The risk of having a heart attack, stroke, cancer, or lung disease is greatly reduced.  After 1 year, the risk of coronary heart disease is cut in half.  After 5 years, the risk of stroke falls to the same as a nonsmoker.  After 10 years, the risk of lung cancer is cut in half and the risk of other cancers decreases significantly.  After 15 years, the risk of coronary heart disease drops, usually to the level of a nonsmoker.  If you are pregnant, quitting smoking will improve your chances of having a healthy baby.  The people you live with, especially any children, will be healthier.  You will have extra money to spend on things other than cigarettes. QUESTIONS TO THINK ABOUT BEFORE ATTEMPTING TO QUIT You may want to talk about your answers with your  caregiver.  Why do you want to quit?  If you tried to quit in the past, what helped and what did not?  What will be the most difficult situations for you after you quit? How will you plan to handle them?  Who can help you through the tough times? Your family? Friends? A caregiver?  What pleasures do you get from smoking? What ways can you still get pleasure if you quit? Here are some questions to ask your caregiver:  How can you help me to be successful at quitting?  What medicine do you think would be best for me and how should I take it?  What should I do if I need more help?  What is smoking withdrawal like? How can I get information on withdrawal? GET READY  Set a quit date.  Change your environment by getting rid of all cigarettes, ashtrays, matches, and lighters in your home, car, or work. Do not let people smoke in your home.  Review your past attempts to quit. Think about what worked and what did not. GET SUPPORT AND ENCOURAGEMENT You have a better chance of being successful if you have help. You can get support in many ways.  Tell your family, friends, and co-workers that you are going to quit and need their support. Ask them not to smoke around you.  Get individual, group, or telephone counseling and support. Programs are available at local hospitals and health centers. Call your local health department for   information about programs in your area.  Spiritual beliefs and practices may help some smokers quit.  Download a "quit meter" on your computer to keep track of quit statistics, such as how long you have gone without smoking, cigarettes not smoked, and money saved.  Get a self-help book about quitting smoking and staying off of tobacco. LEARN NEW SKILLS AND BEHAVIORS  Distract yourself from urges to smoke. Talk to someone, go for a walk, or occupy your time with a task.  Change your normal routine. Take a different route to work. Drink tea instead of coffee.  Eat breakfast in a different place.  Reduce your stress. Take a hot bath, exercise, or read a book.  Plan something enjoyable to do every day. Reward yourself for not smoking.  Explore interactive web-based programs that specialize in helping you quit. GET MEDICINE AND USE IT CORRECTLY Medicines can help you stop smoking and decrease the urge to smoke. Combining medicine with the above behavioral methods and support can greatly increase your chances of successfully quitting smoking.  Nicotine replacement therapy helps deliver nicotine to your body without the negative effects and risks of smoking. Nicotine replacement therapy includes nicotine gum, lozenges, inhalers, nasal sprays, and skin patches. Some may be available over-the-counter and others require a prescription.  Antidepressant medicine helps people abstain from smoking, but how this works is unknown. This medicine is available by prescription.  Nicotinic receptor partial agonist medicine simulates the effect of nicotine in your brain. This medicine is available by prescription. Ask your caregiver for advice about which medicines to use and how to use them based on your health history. Your caregiver will tell you what side effects to look out for if you choose to be on a medicine or therapy. Carefully read the information on the package. Do not use any other product containing nicotine while using a nicotine replacement product.  RELAPSE OR DIFFICULT SITUATIONS Most relapses occur within the first 3 months after quitting. Do not be discouraged if you start smoking again. Remember, most people try several times before finally quitting. You may have symptoms of withdrawal because your body is used to nicotine. You may crave cigarettes, be irritable, feel very hungry, cough often, get headaches, or have difficulty concentrating. The withdrawal symptoms are only temporary. They are strongest when you first quit, but they will go away within  10 14 days. To reduce the chances of relapse, try to:  Avoid drinking alcohol. Drinking lowers your chances of successfully quitting.  Reduce the amount of caffeine you consume. Once you quit smoking, the amount of caffeine in your body increases and can give you symptoms, such as a rapid heartbeat, sweating, and anxiety.  Avoid smokers because they can make you want to smoke.  Do not let weight gain distract you. Many smokers will gain weight when they quit, usually less than 10 pounds. Eat a healthy diet and stay active. You can always lose the weight gained after you quit.  Find ways to improve your mood other than smoking. FOR MORE INFORMATION  www.smokefree.gov  Document Released: 04/11/2001 Document Revised: 10/17/2011 Document Reviewed: 07/27/2011 [employer] Patient Information 2014 [employer].  

## 2013-07-17 NOTE — Progress Notes (Signed)
[facility] [address] [place] [address] [phone]  Date:  07/17/2013   Name:  [name]   DOB:  1960/07/08   MRN:  [id number]  PCP:  [name], MD    Chief Complaint: Medication Refill   History of Present Illness:  [name] is a 53 y.o. very pleasant female patient who presents with the following:  Comes for labs and refills of her meds.  Is in a chronic pain management program with Dr. Brigitte Pulse.  Has an am cough whenever she takes her meds.  Non productive.  Resists referral to pain management.  Still smoking.  No interest in stopping.  Has recent (3 weeks) history of right low back pain after picking up an empty box.  No improvement with over the counter medications or other home remedies. Denies other complaint or health concern today.   Patient Active Problem List   Diagnosis Date Noted  . Hyperthyroidism 07/11/2013  . Multiple thyroid nodules   . Carotid bruit   . Nodule of neck   . Depression   . Coronary artery disease   . Hypertension   . Hypercholesteremia   . Neuromuscular disorder   . Arthritis   . DDD (degenerative disc disease), lumbar   . Ejection fraction   . Hx of CABG   . Glucose intolerance (pre-diabetes) 02/08/2012  . Encounter for long-term (current) use of other medications 02/08/2012  . Spinal stenosis of lumbar region 02/08/2012  . Neuropathy 02/08/2012  . Superficial phlebitis and thrombophlebitis of the leg 04/09/2011  . Back pain, chronic   . TOBACCO ABUSE 11/20/2008    Past Medical History  Diagnosis Date  . Back pain, chronic   . Depression   . Myocardial infarction 11/20/06  . Hypertension   . Hypercholesteremia   . Coronary artery disease   . Allergy   . Arthritis   . Neuromuscular disorder   . Bronchitis     hx-no inhalers at home  . Diabetes mellitus     diet controlled  . DDD (degenerative disc disease), lumbar   . Ejection fraction     .  Marland Kitchen Hx of CABG   .  Carotid bruit   . Nodule of neck   . Multiple thyroid nodules     Past Surgical History  Procedure Laterality Date  . Mandible surgery  2008  . Knee surgery  2009    lt   . Tubal ligation    . Breast surgery      rt br cystx2  . Abdominal hysterectomy  1984  . Coronary artery bypass graft  2008  . Orif ankle fracture Right 07/12/2012    Procedure: OPEN REDUCTION INTERNAL FIXATION (ORIF) RIGHT ANKLE FRACTURE;  Surgeon: Alta Corning, MD;  Location: [place];  Service: Orthopedics;  Laterality: Right;    History  Substance Use Topics  . Smoking status: Current Every Day Smoker -- 0.50 packs/day  . Smokeless tobacco: Never Used  . Alcohol Use: No    Family History  Problem Relation Age of Onset  . Heart disease Mother   . Hyperlipidemia Mother   . Hypertension Mother   . Heart disease Father   . Stroke Father   . Hypertension Father   . Hyperlipidemia Father   . Hypertension Sister   . Hypertension Brother   . Cancer Daughter     unknown  . Hypertension Maternal Grandmother   . Hypertension Maternal Grandfather   .  Cancer Maternal Grandfather   . Hypertension Paternal Grandmother   . Hypertension Paternal Grandfather   . Cancer Maternal Aunt     breast and ovarian  . Cancer Paternal Aunt     breast    Allergies  Allergen Reactions  . Ibuprofen Swelling and Rash  . Prednisone Swelling and Rash  . Morphine     REACTION: Nausea  . Coconut Oil Swelling and Rash    Medication list has been reviewed and updated.  Current Outpatient Prescriptions on File Prior to Visit  Medication Sig Dispense Refill  . ARIPiprazole (ABILIFY) 10 MG tablet Take 10 mg by mouth daily.      Marland Kitchen aspirin 81 MG tablet Take 81 mg by mouth 2 (two) times daily.       . benzonatate (TESSALON) 200 MG capsule Take 1 capsule (200 mg total) by mouth 3 (three) times daily as needed for cough.  30 capsule  0  . Biotin 5 MG CAPS Take by mouth.      . carvedilol (COREG) 3.125 MG  tablet Take 1 tablet (3.125 mg total) by mouth 2 (two) times daily. PATIENT NEEDS OFFICE VISIT FOR ADDITIONAL REFILLS  60 tablet  0  . DULoxetine (CYMBALTA) 60 MG capsule Take 60 mg by mouth daily.        . fish oil-omega-3 fatty acids 1000 MG capsule Take 2 g by mouth daily.        Marland Kitchen gabapentin (NEURONTIN) 400 MG capsule Take 2 capsules (800 mg total) by mouth 3 (three) times daily.  180 capsule  2  . gabapentin (NEURONTIN) 400 MG capsule TAKE TWO CAPSULES THREE TIMES DAILY  180 capsule  5  . HYDROcodone-homatropine (HYCODAN) 5-1.5 MG/5ML syrup Take 5 mLs by mouth every 4 (four) hours as needed for cough.  120 mL  0  . lisinopril (PRINIVIL,ZESTRIL) 5 MG tablet Take 1 tablet (5 mg total) by mouth daily. PATIENT NEEDS OFFICE VISIT FOR ADDITIONAL REFILLS  30 tablet  0  . methocarbamol (ROBAXIN) 500 MG tablet Take 1 tablet (500 mg total) by mouth 4 (four) times daily.  40 tablet  0  . oxyCODONE-acetaminophen (PERCOCET/ROXICET) 5-325 MG per tablet Take 1-2 tablets by mouth every 6 (six) hours as needed for pain. May fill on or after 05/10/2013  150 tablet  0  . QUEtiapine (SEROQUEL XR) 300 MG 24 hr tablet Take 300 mg by mouth at bedtime. 300 mg + 50 mg=350 mg      . QUEtiapine (SEROQUEL XR) 50 MG TB24 Take 50 mg by mouth at bedtime. 50 mg + 300 mg=350 mg      . ranitidine (ZANTAC) 150 MG tablet Take 1 tablet (150 mg total) by mouth 2 (two) times daily.  180 tablet  1  . ranitidine (ZANTAC) 150 MG tablet Take 1 tablet (150 mg total) by mouth 2 (two) times daily. PATIENT NEEDS OFFICE VISIT FOR ADDITIONAL REFILLS  60 tablet  0  . rosuvastatin (CRESTOR) 20 MG tablet Take 1 tablet (20 mg total) by mouth daily. PATIENT NEEDS OFFICE VISIT FOR ADDITIONAL REFILLS  30 tablet  0  . traZODone (DESYREL) 50 MG tablet TAKE ONE TABLET EVERY NIGHT AT BEDTIME AS NEEDED FOR SLEEP  30 tablet  1  . zolpidem (AMBIEN) 10 MG tablet       . cefdinir (OMNICEF) 300 MG capsule Take 2 capsules (600 mg total) by mouth daily.  20  capsule  0   No current facility-administered medications on file prior to  visit.    Review of Systems:  As per HPI, otherwise negative.    Physical Examination: Filed Vitals:   07/17/13 1314  BP: 120/68  Pulse: 68  Temp: 98.2 F (36.8 C)  Resp: 16   Filed Vitals:   07/17/13 1314  Height: 5\' 2"  (1.575 m)  Weight: 160 lb 6.4 oz (72.757 kg)   Body mass index is 29.33 kg/(m^2). Ideal Body Weight: Weight in (lb) to have BMI = 25: 136.4  GEN: WDWN, NAD, Non-toxic, A & O x 3 HEENT: Atraumatic, Normocephalic. Neck supple. No masses, No LAD. Ears and Nose: No external deformity. CV: RRR, No M/G/R. No JVD. No thrill. No extra heart sounds. PULM: CTA B, no wheezes, crackles, rhonchi. No retractions. No resp. distress. No accessory muscle use. ABD: S, NT, ND, +BS. No rebound. No HSM. EXTR: No c/c/e NEURO Normal gait.  PSYCH: Normally interactive. Conversant. Not depressed or anxious appearing.  Calm demeanor.    Assessment and Plan: Chronic low back pain CAD, HBP Cholesterolemia Tobacco abuse  Signed,  Ellison Carwin, MD

## 2013-07-18 LAB — COMPREHENSIVE METABOLIC PANEL
ALBUMIN: 4.2 g/dL (ref 3.5–5.2)
AST: 13 U/L (ref 0–37)
Alkaline Phosphatase: 101 U/L (ref 39–117)
BUN: 19 mg/dL (ref 6–23)
CALCIUM: 9.2 mg/dL (ref 8.4–10.5)
CHLORIDE: 103 meq/L (ref 96–112)
CO2: 27 mEq/L (ref 19–32)
Creat: 0.73 mg/dL (ref 0.50–1.10)
Glucose, Bld: 86 mg/dL (ref 70–99)
POTASSIUM: 4.2 meq/L (ref 3.5–5.3)
SODIUM: 138 meq/L (ref 135–145)
TOTAL PROTEIN: 6.9 g/dL (ref 6.0–8.3)
Total Bilirubin: 0.4 mg/dL (ref 0.2–1.2)

## 2013-07-18 LAB — LIPID PANEL
Cholesterol: 129 mg/dL (ref 0–200)
HDL: 34 mg/dL — ABNORMAL LOW (ref >39)
LDL Cholesterol: 61 mg/dL (ref 0–99)
Total CHOL/HDL Ratio: 3.8 Ratio
Triglycerides: 168 mg/dL — ABNORMAL HIGH (ref <150)
VLDL: 34 mg/dL (ref 0–40)

## 2013-07-18 LAB — TSH: TSH: 0.707 u[IU]/mL (ref 0.350–4.500)

## 2013-07-18 MED ORDER — ROSUVASTATIN CALCIUM 20 MG PO TABS: 20.0000 mg | ORAL_TABLET | Freq: Every day | ORAL | Status: DC

## 2013-07-18 NOTE — Addendum Note (Signed)
Addended by: Roselee Culver on: 07/18/2013 10:24 AM   Modules accepted: Orders

## 2013-07-21 ENCOUNTER — Encounter: Payer: Self-pay | Admitting: Gastroenterology

## 2013-07-23 ENCOUNTER — Ambulatory Visit (HOSPITAL_COMMUNITY)
Admission: RE | Admit: 2013-07-23 | Discharge: 2013-07-23 | Disposition: A | Discharge status: Home / Self Care | Payer: Medicare HMO | Source: Ambulatory Visit | Attending: Endocrinology | Admitting: Endocrinology

## 2013-07-23 DIAGNOSIS — E0590 Thyrotoxicosis, unspecified without thyrotoxic crisis or storm: Secondary | ICD-10-CM

## 2013-07-23 DIAGNOSIS — E042 Nontoxic multinodular goiter: Secondary | ICD-10-CM | POA: Insufficient documentation

## 2013-07-24 ENCOUNTER — Encounter (HOSPITAL_COMMUNITY)
Admission: RE | Admit: 2013-07-24 | Discharge: 2013-07-24 | Disposition: A | Discharge status: Home / Self Care | Payer: Medicare HMO | Source: Ambulatory Visit | Attending: Endocrinology | Admitting: Endocrinology

## 2013-07-24 DIAGNOSIS — E042 Nontoxic multinodular goiter: Secondary | ICD-10-CM | POA: Diagnosis present

## 2013-07-24 MED ORDER — SODIUM PERTECHNETATE TC 99M INJECTION
10.0000 | Freq: Once | INTRAVENOUS | Status: AC | PRN
  Administered 2013-07-24: 10 via INTRAVENOUS

## 2013-07-28 ENCOUNTER — Telehealth: Payer: Self-pay

## 2013-07-28 ENCOUNTER — Ambulatory Visit (INDEPENDENT_AMBULATORY_CARE_PROVIDER_SITE_OTHER): Payer: Medicare PPO | Admitting: Internal Medicine

## 2013-07-28 VITALS — BP 120/66 | HR 73 | Temp 97.6°F | Resp 16 | Ht 62.0 in | Wt 161.0 lb

## 2013-07-28 DIAGNOSIS — I809 Phlebitis and thrombophlebitis of unspecified site: Secondary | ICD-10-CM

## 2013-07-28 DIAGNOSIS — E0590 Thyrotoxicosis, unspecified without thyrotoxic crisis or storm: Secondary | ICD-10-CM

## 2013-07-28 DIAGNOSIS — M79609 Pain in unspecified limb: Secondary | ICD-10-CM

## 2013-07-28 DIAGNOSIS — M79661 Pain in right lower leg: Secondary | ICD-10-CM

## 2013-07-28 NOTE — Telephone Encounter (Signed)
[facility] called requesting that a referral be placed for RAI therapy. Pt called to set up appointment but referral was not placed.  Please advise Thanks!

## 2013-07-28 NOTE — Telephone Encounter (Signed)
Noted, Pt informed via voicemail.

## 2013-07-28 NOTE — Progress Notes (Signed)
   Subjective:    Patient ID: [name], female    DOB: Jul 15, 1960, 53 y.o.   MRN: [id number]  HPI Complaining of Pain, right calf, no know injury x 1  Day --with swelling and a palpable mass She has a history of thromboses in the superficial veins in this right leg and always has the question of a deeper blood clot She has a painful gait the last 24 hours She has had varicosities repair to left  Patient Active Problem List   Diagnosis Date Noted  . Hyperthyroidism 07/11/2013  . Multiple thyroid nodules   . Carotid bruit   . Nodule of neck   . Depression   . Coronary artery disease   . Hypertension   . Hypercholesteremia   . Neuromuscular disorder   . Arthritis   . DDD (degenerative disc disease), lumbar   . Ejection fraction   . Hx of CABG   . Glucose intolerance (pre-diabetes) 02/08/2012  . Encounter for long-term (current) use of other medications 02/08/2012  . Spinal stenosis of lumbar region 02/08/2012  . Neuropathy 02/08/2012  . Superficial phlebitis and thrombophlebitis of the leg 04/09/2011  . Back pain, chronic   . TOBACCO ABUSE 11/20/2008      Review of Systems Noncontributory     Objective:   Physical Exam BP 120/66  Pulse 73  Temp(Src) 97.6 F (36.4 C) (Oral)  Resp 16  Ht 5\' 2"  (1.575 m)  Wt 161 lb (73.029 kg)  BMI 29.44 kg/m2  SpO2 92% No acute distress The right calf is slightly swollen There are no palpable cords or masses She is tender in the midline of the calf but not the Achilles Ankle range of motion is good but Homans is positive No varicosities are seen She has a birthmark which covers most of this lower extremity but no edema       Assessment & Plan:  Right calf pain - Plan: US Venous Img Lower Unilateral Right  Thrombophlebitis  Doppler ultrasound//she is on an aspirin

## 2013-07-28 NOTE — Telephone Encounter (Signed)
Ok, i have ordered.  you will receive a phone call, about a day and time for an appointment. Please come back for a follow-up appointment about 6-8 weeks later.

## 2013-08-04 ENCOUNTER — Encounter (HOSPITAL_COMMUNITY)
Admission: RE | Admit: 2013-08-04 | Discharge: 2013-08-04 | Disposition: A | Discharge status: Home / Self Care | Payer: Medicare HMO | Source: Ambulatory Visit | Attending: Endocrinology | Admitting: Endocrinology

## 2013-08-04 DIAGNOSIS — E0590 Thyrotoxicosis, unspecified without thyrotoxic crisis or storm: Secondary | ICD-10-CM | POA: Insufficient documentation

## 2013-08-04 LAB — HCG, SERUM, QUALITATIVE: PREG SERUM: NEGATIVE

## 2013-08-04 MED ORDER — SODIUM IODIDE I 131 CAPSULE
29.3000 | Freq: Once | INTRAVENOUS | Status: AC | PRN
  Administered 2013-08-04: 29.3 via ORAL

## 2013-08-13 NOTE — Telephone Encounter (Signed)
Please close encounter, Thanks! SR °

## 2013-08-15 ENCOUNTER — Telehealth: Payer: Self-pay

## 2013-08-15 NOTE — Telephone Encounter (Signed)
Not sure I can offer much here. Please refer to the [facility] controlled substance policy.

## 2013-08-15 NOTE — Telephone Encounter (Signed)
Pt would like a refill on percocet.  Best# [phone]

## 2013-08-15 NOTE — Telephone Encounter (Signed)
Pt says Dr. Brigitte Pulse normally RF's this for her. She said she couldn't afford to RTC. Please advise.

## 2013-08-15 NOTE — Telephone Encounter (Signed)
She knows she MUST come in.

## 2013-08-15 NOTE — Telephone Encounter (Signed)
Pt was given #150 on 07/17/13. Left message on machine to call back

## 2013-08-16 MED ORDER — OXYCODONE-ACETAMINOPHEN 5-325 MG PO TABS: 1.0000 | ORAL_TABLET | Freq: Four times a day (QID) | ORAL | Status: DC | PRN

## 2013-08-16 NOTE — Telephone Encounter (Signed)
I have written a one month rx for percocet as Dr. Brigitte Pulse remains out of the office on leave.  Pt needs an appt with Dr. Brigitte Pulse in the next month.  No further refills.

## 2013-08-17 NOTE — Telephone Encounter (Signed)
Pt notified that rx is ready for pick up

## 2013-08-27 ENCOUNTER — Ambulatory Visit (AMBULATORY_SURGERY_CENTER): Payer: Commercial Managed Care - HMO | Admitting: *Deleted

## 2013-08-27 VITALS — Ht 62.0 in | Wt 163.4 lb

## 2013-08-27 DIAGNOSIS — Z1211 Encounter for screening for malignant neoplasm of colon: Secondary | ICD-10-CM

## 2013-08-27 MED ORDER — NA SULFATE-K SULFATE-MG SULF 17.5-3.13-1.6 GM/177ML PO SOLN: ORAL | Status: DC

## 2013-08-27 NOTE — Progress Notes (Signed)
Patient denies any allergies to eggs or soy. Patient denies any problems with anesthesia. No oxygen use at home, NO diet/wt loss pills per patient. EMMI education given to patient on colonoscopy.

## 2013-09-10 ENCOUNTER — Encounter: Payer: Self-pay | Admitting: Gastroenterology

## 2013-09-10 ENCOUNTER — Other Ambulatory Visit: Payer: Self-pay | Admitting: Gastroenterology

## 2013-09-10 ENCOUNTER — Ambulatory Visit (AMBULATORY_SURGERY_CENTER): Payer: Medicare HMO | Admitting: Gastroenterology

## 2013-09-10 VITALS — BP 120/75 | HR 57 | Temp 98.5°F | Resp 39 | Ht 62.0 in | Wt 163.0 lb

## 2013-09-10 DIAGNOSIS — D126 Benign neoplasm of colon, unspecified: Secondary | ICD-10-CM

## 2013-09-10 DIAGNOSIS — K648 Other hemorrhoids: Secondary | ICD-10-CM

## 2013-09-10 DIAGNOSIS — Z1211 Encounter for screening for malignant neoplasm of colon: Secondary | ICD-10-CM

## 2013-09-10 DIAGNOSIS — K5730 Diverticulosis of large intestine without perforation or abscess without bleeding: Secondary | ICD-10-CM

## 2013-09-10 MED ORDER — SODIUM CHLORIDE 0.9 % IV SOLN: 500.0000 mL | INTRAVENOUS | Status: DC

## 2013-09-10 NOTE — Patient Instructions (Signed)
YOU HAD AN ENDOSCOPIC PROCEDURE TODAY AT THE [facility]: Refer to the procedure report that was given to you for any specific questions about what was found during the examination.  If the procedure report does not answer your questions, please call your gastroenterologist to clarify.  If you requested that your care partner not be given the details of your procedure findings, then the procedure report has been included in a sealed envelope for you to review at your convenience later.  YOU SHOULD EXPECT: Some feelings of bloating in the abdomen. Passage of more gas than usual.  Walking can help get rid of the air that was put into your GI tract during the procedure and reduce the bloating. If you had a lower endoscopy (such as a colonoscopy or flexible sigmoidoscopy) you may notice spotting of blood in your stool or on the toilet paper. If you underwent a bowel prep for your procedure, then you may not have a normal bowel movement for a few days.  DIET: Your first meal following the procedure should be a light meal and then it is ok to progress to your normal diet.  A half-sandwich or bowl of soup is an example of a good first meal.  Heavy or fried foods are harder to digest and may make you feel nauseous or bloated.  Likewise meals heavy in dairy and vegetables can cause extra gas to form and this can also increase the bloating.  Drink plenty of fluids but you should avoid alcoholic beverages for 24 hours.  ACTIVITY: Your care partner should take you home directly after the procedure.  You should plan to take it easy, moving slowly for the rest of the day.  You can resume normal activity the day after the procedure however you should NOT DRIVE or use heavy machinery for 24 hours (because of the sedation medicines used during the test).    SYMPTOMS TO REPORT IMMEDIATELY: A gastroenterologist can be reached at any hour.  During normal business hours, 8:30 AM to 5:00 PM Monday through Friday,  call [phone].  After hours and on weekends, please call the GI answering service at [phone] who will take a message and have the physician on call contact you.   Following lower endoscopy (colonoscopy or flexible sigmoidoscopy):  Excessive amounts of blood in the stool  Significant tenderness or worsening of abdominal pains  Swelling of the abdomen that is new, acute  Fever of 100F or higher  FOLLOW UP: If any biopsies were taken you will be contacted by phone or by letter within the next 1-3 weeks.  Call your gastroenterologist if you have not heard about the biopsies in 3 weeks.  Our staff will call the home number listed on your records the next business day following your procedure to check on you and address any questions or concerns that you may have at that time regarding the information given to you following your procedure. This is a courtesy call and so if there is no answer at the home number and we have not heard from you through the emergency physician on call, we will assume that you have returned to your regular daily activities without incident.  SIGNATURES/CONFIDENTIALITY: You and/or your care partner have signed paperwork which will be entered into your electronic medical record.  These signatures attest to the fact that that the information above on your After Visit Summary has been reviewed and is understood.  Full responsibility of the confidentiality of this   discharge information lies with you and/or your care-partner.  Polyps removed and sent to pathology Moderate diverticulosis Internal hemorrhoids  Await pathology for final recall recommendations

## 2013-09-10 NOTE — Op Note (Signed)
[facility]  [address] [place], [address]   COLONOSCOPY PROCEDURE REPORT  PATIENT: [name], [name]  MR#: [id number] BIRTHDATE: 1960-08-23 , 6  yrs. old GENDER: Female ENDOSCOPIST: Inda Castle, MD REFERRED Katherina Mires, M.D. PROCEDURE DATE:  09/10/2013 PROCEDURE:   Colonoscopy with snare polypectomy First Screening Colonoscopy - Avg.  risk and is 50 yrs.  old or older Yes.  Prior Negative Screening - Now for repeat screening. N/A  History of Adenoma - Now for follow-up colonoscopy & has been > or = to 3 yrs.  N/A  Polyps Removed Today? Yes. ASA CLASS:   Class III INDICATIONS:average risk screening. MEDICATIONS: propofol (Diprivan) 200mg  IV  DESCRIPTION OF PROCEDURE:   After the risks benefits and alternatives of the procedure were thoroughly explained, informed consent was obtained.  A digital rectal exam revealed no abnormalities of the rectum.   The LB [id number]  endoscope was introduced through the anus and advanced to the cecum, which was identified by both the appendix and ileocecal valve. No adverse events experienced.   The quality of the prep was Suprep good  The instrument was then slowly withdrawn as the colon was fully examined.      COLON FINDINGS: A sessile polyp measuring 3 mm in size was found in the descending colon.  A polypectomy was performed.  The resection was complete and the polyp tissue was completely retrieved. Multiple sessile polyps measuring 3-8 mm in size were found in the sigmoid colon and rectum. Overlying mucosa appeared normal and polyps had the appearance of hyperplastic polyps.  The 3 largest polyps were removed with cold polypectomy snare..  The resection was complete and the polyp tissue was completely retrieved. Moderate diverticulosis was noted in the proximal sigmoid colon and descending colon.   Internal hemorrhoids were found.  Retroflexed views revealed no abnormalities. The time to cecum=3  minutes 21 seconds.  Withdrawal time=13 minutes 08 seconds.  The scope was withdrawn and the procedure completed. COMPLICATIONS: There were no complications.  ENDOSCOPIC IMPRESSION: 1.   Sessile polyp measuring 3 mm in size was found in the descending colon; polypectomy was performed 2.   Multiple sessile polyps measuring 3-8 mm in size were found in the sigmoid colon and rectum; polypectomy was performed 3.   Moderate diverticulosis was noted in the proximal sigmoid colon and descending colon 4.   Internal hemorrhoids  RECOMMENDATIONS: If the polyp(s) removed today in the descending colon are proven to be adenomatous (pre-cancerous) polyps, you will need a repeat colonoscopy in 5 years.  Otherwise you should continue to follow colorectal cancer screening guidelines for "routine risk" patients with colonoscopy in 10 years.  should the polyps in the rectosigmoid and rectum be adenomatous then I would followup sigmoidoscopy in 3-6 months and colonoscopy in 3 years.  You will receive a letter within 1-2 weeks with the results of your biopsy as well as final recommendations.  Please call my office if you have not received a letter after 3 weeks.   eSigned:  Inda Castle, MD 09/10/2013 12:03 PM   cc:   PATIENT NAME:  [name], [name] MR#: [id number]

## 2013-09-10 NOTE — Progress Notes (Signed)
Called to room to assist during endoscopic procedure.  Patient ID and intended procedure confirmed with present staff. Received instructions for my participation in the procedure from the performing physician.  

## 2013-09-11 ENCOUNTER — Telehealth: Payer: Self-pay | Admitting: *Deleted

## 2013-09-11 NOTE — Telephone Encounter (Signed)
No answer, message left for the patient. 

## 2013-09-12 ENCOUNTER — Ambulatory Visit (INDEPENDENT_AMBULATORY_CARE_PROVIDER_SITE_OTHER): Payer: Medicare PPO | Admitting: Family Medicine

## 2013-09-12 ENCOUNTER — Encounter: Payer: Self-pay | Admitting: Family Medicine

## 2013-09-12 VITALS — BP 118/64 | HR 72 | Resp 18 | Ht 62.5 in | Wt 152.0 lb

## 2013-09-12 DIAGNOSIS — E7800 Pure hypercholesterolemia, unspecified: Secondary | ICD-10-CM

## 2013-09-12 DIAGNOSIS — G589 Mononeuropathy, unspecified: Secondary | ICD-10-CM

## 2013-09-12 DIAGNOSIS — M48061 Spinal stenosis, lumbar region without neurogenic claudication: Secondary | ICD-10-CM

## 2013-09-12 DIAGNOSIS — G629 Polyneuropathy, unspecified: Secondary | ICD-10-CM

## 2013-09-12 MED ORDER — OXYCODONE-ACETAMINOPHEN 5-325 MG PO TABS: 1.0000 | ORAL_TABLET | ORAL | Status: DC | PRN

## 2013-09-12 MED ORDER — CARVEDILOL 3.125 MG PO TABS: 3.1250 mg | ORAL_TABLET | Freq: Two times a day (BID) | ORAL | Status: DC

## 2013-09-12 MED ORDER — ZOLPIDEM TARTRATE 5 MG PO TABS: 5.0000 mg | ORAL_TABLET | Freq: Every evening | ORAL | Status: DC | PRN

## 2013-09-12 MED ORDER — ROSUVASTATIN CALCIUM 20 MG PO TABS: 20.0000 mg | ORAL_TABLET | Freq: Every day | ORAL | Status: DC

## 2013-09-12 NOTE — Progress Notes (Signed)
Subjective:    Patient ID: [name], female    DOB: [birth year], 53 y.o.   MRN: [id number] Chief Complaint  Patient presents with  . Medication Refill    pended      HPI This chart was scribed for Shawnee Knapp, MD by Ladene Artist, ED Scribe. The patient was seen in room 22. Patient's care was started at 2:19 PM.  HPI Comments: [name] is a 53 y.o. female who presents to the [facility] for medication refill.   Pt had colonoscopy yesterday. She had 3 3-8 mm polyps removed. She was advised to eat more fiber and have another colonoscopy in 6 months. She also reports hemorrhoids.   Pt reports fatigue with hyperthyroidism. She states that some days it is hard to get out of the bed. She also had radioiodine and will begin synthroid in 3 days. She reports enlargement of thyroid since radiation.   Pt reports clot in her leg. She was supposed to follow up but states that no one ever called her. She took a baby ASA every day and states that it improved.   Pt's back pain has improved since March. She uses 5 tabs of Percocet daily. She is able to tolerate the pain at this point but has not yet returned to exercising. Pt states that she used to workout 5 days a week and has lost 11 lbs since last visit.   Pt also states that she is supposed to see a Periodontist.  Dr. Ouida Sills took her off of DM medication. She reports a bad experience with Dr. Ouida Sills. She states that he told her he was not giving her medication for her back because he thought that she was not being truthful about her back pain.   Pap smear is overdue. Pt has had a hysterectomy, she denies oophorectomy.   Past Medical History  Diagnosis Date  . Back pain, chronic   . Depression   . Myocardial infarction 11/20/06  . Hypertension   . Hypercholesteremia   . Coronary artery disease   . Allergy   . Arthritis   . Neuromuscular disorder   . Bronchitis     hx-no inhalers  at home  . Diabetes mellitus     diet controlled  . DDD (degenerative disc disease), lumbar   . Ejection fraction     .  Marland Kitchen Hx of CABG   . Carotid bruit   . Nodule of neck   . Multiple thyroid nodules    Current Outpatient Prescriptions on File Prior to Visit  Medication Sig Dispense Refill  . aspirin 81 MG tablet Take 81 mg by mouth 2 (two) times daily.       . carvedilol (COREG) 3.125 MG tablet Take 1 tablet (3.125 mg total) by mouth 2 (two) times daily. PATIENT NEEDS OFFICE VISIT FOR ADDITIONAL REFILLS  60 tablet  5  . DULoxetine (CYMBALTA) 60 MG capsule Take 1 capsule (60 mg total) by mouth daily.  30 capsule  5  . fish oil-omega-3 fatty acids 1000 MG capsule Take 2 g by mouth daily.        Marland Kitchen gabapentin (NEURONTIN) 400 MG capsule Take 2 capsules (800 mg total) by mouth 3 (three) times daily.  180 capsule  2  . oxyCODONE-acetaminophen (PERCOCET/ROXICET) 5-325 MG per tablet Take 1-2 tablets by mouth every 6 (six) hours as needed.  150 tablet  0  . QUEtiapine (SEROQUEL XR) 300 MG 24 hr  tablet Take 1 tablet (300 mg total) by mouth at bedtime. 300 mg + 50 mg=350 mg  30 tablet  5  . QUEtiapine (SEROQUEL XR) 50 MG TB24 24 hr tablet Take 1 tablet (50 mg total) by mouth at bedtime. 50 mg + 300 mg=350 mg  30 each  5  . ranitidine (ZANTAC) 150 MG tablet Take 1 tablet (150 mg total) by mouth 2 (two) times daily. PATIENT NEEDS OFFICE VISIT FOR ADDITIONAL REFILLS  60 tablet  5  . rosuvastatin (CRESTOR) 20 MG tablet Take 1 tablet (20 mg total) by mouth daily. PATIENT NEEDS OFFICE VISIT FOR ADDITIONAL REFILLS  30 tablet  0  . traZODone (DESYREL) 50 MG tablet TAKE ONE TABLET EVERY NIGHT AT BEDTIME AS NEEDED FOR SLEEP  30 tablet  5  . zolpidem (AMBIEN) 10 MG tablet        No current facility-administered medications on file prior to visit.   Allergies  Allergen Reactions  . Ibuprofen Swelling and Rash  . Prednisone Swelling and Rash  . Morphine Nausea And Vomiting    REACTION: Nausea  . Coconut  Oil Swelling and Rash   Review of Systems  Constitutional: Positive for fatigue.  Musculoskeletal: Positive for back pain (improved).       Objective:  BP 118/64  Pulse 72  Resp 18  Ht 5' 2.5" (1.588 m)  Wt 152 lb (68.947 kg)  BMI 27.34 kg/m2  SpO2 95%  Physical Exam  Nursing note and vitals reviewed. Constitutional: She appears well-developed and well-nourished. No distress.  HENT:  Head: Normocephalic and atraumatic.  Neck: Neck supple.  Cardiovascular: Normal rate and regular rhythm.   No murmur heard. Pulmonary/Chest: Effort normal.  Lungs clear  Lymphadenopathy:  Enlarged thyroid No nodules   Neurological: She is alert.  Skin: She is not diaphoretic.      Assessment & Plan:   Spinal stenosis of lumbar region  Pure hypercholesterolemia  Neuropathy  Meds ordered this encounter  Medications  . oxyCODONE-acetaminophen (PERCOCET/ROXICET) 5-325 MG per tablet    Sig: Take 1-2 tablets by mouth every 4 (four) hours as needed for moderate pain.    Dispense:  150 tablet    Refill:  0    Order Specific Question:  Supervising Provider    Answer:  Wardell Honour [[id number]]  . zolpidem (AMBIEN) 5 MG tablet    Sig: Take 1 tablet (5 mg total) by mouth at bedtime as needed for sleep.    Dispense:  30 tablet    Refill:  5  . carvedilol (COREG) 3.125 MG tablet    Sig: Take 1 tablet (3.125 mg total) by mouth 2 (two) times daily.    Dispense:  60 tablet    Refill:  5  . rosuvastatin (CRESTOR) 20 MG tablet    Sig: Take 1 tablet (20 mg total) by mouth daily.    Dispense:  30 tablet    Refill:  5  . oxyCODONE-acetaminophen (PERCOCET/ROXICET) 5-325 MG per tablet    Sig: Take 1-2 tablets by mouth every 4 (four) hours as needed for moderate pain.    Dispense:  150 tablet    Refill:  0    May fill on or after 10/12/13.    Order Specific Question:  Supervising Provider    Answer:  Wardell Honour [[id number]]  . oxyCODONE-acetaminophen (PERCOCET/ROXICET) 5-325 MG per tablet    Sig:  Take 1-2 tablets by mouth every 4 (four) hours as needed for moderate pain.  Dispense:  150 tablet    Refill:  0    May fill on or after 11/10/13.    Order Specific Question:  Supervising Provider    Answer:  Wardell Honour [[id number]]    I personally performed the services described in this documentation, which was scribed in my presence. The recorded information has been reviewed and considered, and addended by me as needed.  [name], MD MPH

## 2013-09-15 ENCOUNTER — Encounter: Payer: Self-pay | Admitting: Endocrinology

## 2013-09-15 ENCOUNTER — Ambulatory Visit (INDEPENDENT_AMBULATORY_CARE_PROVIDER_SITE_OTHER): Payer: Medicare HMO | Admitting: Endocrinology

## 2013-09-15 VITALS — BP 112/72 | HR 66 | Temp 98.1°F | Ht 62.5 in | Wt 160.0 lb

## 2013-09-15 DIAGNOSIS — E042 Nontoxic multinodular goiter: Secondary | ICD-10-CM | POA: Insufficient documentation

## 2013-09-15 LAB — TSH: TSH: 1.21 u[IU]/mL (ref 0.35–4.50)

## 2013-09-15 LAB — T4, FREE: Free T4: 0.65 ng/dL (ref 0.60–1.60)

## 2013-09-15 NOTE — Progress Notes (Signed)
Subjective:    Patient ID: [name], female    DOB: 08-06-1960, 53 y.o.   MRN: [id number]  HPI Pt returns for f/u of amultinodular goiter (dx'ed 2014; it was found on [place], which had been done to investigate lymphadenopathy of the neck; the nodes appeared benign, but goiter was incidentally noted; bx was c/w benign follicular nodule; she was noted in early 2015 to have suppressed TSH, and she received I-131 rx 6 in April of 2015).  pt states she feels no different, and well in general. Past Medical History  Diagnosis Date  . Back pain, chronic   . Depression   . Myocardial infarction 11/20/06  . Hypertension   . Hypercholesteremia   . Coronary artery disease   . Allergy   . Arthritis   . Neuromuscular disorder   . Bronchitis     hx-no inhalers at home  . Diabetes mellitus     diet controlled  . DDD (degenerative disc disease), lumbar   . Ejection fraction     .  Marland Kitchen Hx of CABG   . Carotid bruit   . Nodule of neck   . Multiple thyroid nodules     Past Surgical History  Procedure Laterality Date  . Mandible surgery  2008  . Knee surgery  2009    lt   . Tubal ligation    . Breast surgery      rt br cystx2  . Abdominal hysterectomy  1984  . Coronary artery bypass graft  2008  . Orif ankle fracture Right 07/12/2012    Procedure: OPEN REDUCTION INTERNAL FIXATION (ORIF) RIGHT ANKLE FRACTURE;  Surgeon: Alta Corning, MD;  Location: [facility];  Service: Orthopedics;  Laterality: Right;    History   Social History  . Marital Status: Married    Spouse Name: N/A    Number of Children: N/A  . Years of Education: N/A   Occupational History  . Not on file.   Social History Main Topics  . Smoking status: Current Every Day Smoker -- 0.50 packs/day    Types: Cigarettes  . Smokeless tobacco: Never Used  . Alcohol Use: No  . Drug Use: No  . Sexual Activity: Not on file   Other Topics Concern  . Not on file   Social History Narrative  . No  narrative on file    Current Outpatient Prescriptions on File Prior to Visit  Medication Sig Dispense Refill  . aspirin 81 MG tablet Take 81 mg by mouth 2 (two) times daily.       . carvedilol (COREG) 3.125 MG tablet Take 1 tablet (3.125 mg total) by mouth 2 (two) times daily.  60 tablet  5  . DULoxetine (CYMBALTA) 60 MG capsule Take 1 capsule (60 mg total) by mouth daily.  30 capsule  5  . fish oil-omega-3 fatty acids 1000 MG capsule Take 2 g by mouth daily.        Marland Kitchen gabapentin (NEURONTIN) 400 MG capsule Take 2 capsules (800 mg total) by mouth 3 (three) times daily.  180 capsule  2  . oxyCODONE-acetaminophen (PERCOCET/ROXICET) 5-325 MG per tablet Take 1-2 tablets by mouth every 4 (four) hours as needed for moderate pain.  150 tablet  0  . QUEtiapine (SEROQUEL XR) 300 MG 24 hr tablet Take 1 tablet (300 mg total) by mouth at bedtime. 300 mg + 50 mg=350 mg  30 tablet  5  . QUEtiapine (SEROQUEL XR) 50 MG TB24  24 hr tablet Take 1 tablet (50 mg total) by mouth at bedtime. 50 mg + 300 mg=350 mg  30 each  5  . ranitidine (ZANTAC) 150 MG tablet Take 1 tablet (150 mg total) by mouth 2 (two) times daily. PATIENT NEEDS OFFICE VISIT FOR ADDITIONAL REFILLS  60 tablet  5  . rosuvastatin (CRESTOR) 20 MG tablet Take 1 tablet (20 mg total) by mouth daily.  30 tablet  5  . traZODone (DESYREL) 50 MG tablet TAKE ONE TABLET EVERY NIGHT AT BEDTIME AS NEEDED FOR SLEEP  30 tablet  5  . zolpidem (AMBIEN) 10 MG tablet 5 mg.       . zolpidem (AMBIEN) 5 MG tablet Take 1 tablet (5 mg total) by mouth at bedtime as needed for sleep.  30 tablet  5   No current facility-administered medications on file prior to visit.    Allergies  Allergen Reactions  . Ibuprofen Swelling and Rash  . Prednisone Swelling and Rash  . Morphine Nausea And Vomiting    REACTION: Nausea  . Coconut Oil Swelling and Rash    Family History  Problem Relation Age of Onset  . Heart disease Mother   . Hyperlipidemia Mother   . Hypertension  Mother   . Heart disease Father   . Stroke Father   . Hypertension Father   . Hyperlipidemia Father   . Hypertension Sister   . Hypertension Brother   . Cancer Daughter     unknown  . Hypertension Maternal Grandmother   . Hypertension Maternal Grandfather   . Cancer Maternal Grandfather   . Hypertension Paternal Grandmother   . Hypertension Paternal Grandfather   . Cancer Maternal Aunt     breast and ovarian  . Cancer Paternal Aunt     breast  . Colon cancer Neg Hx     BP 112/72  Pulse 66  Temp(Src) 98.1 F (36.7 C) (Oral)  Ht 5' 2.5" (1.588 m)  Wt 160 lb (72.576 kg)  BMI 28.78 kg/m2  Review of Systems Denies weight change    Objective:   Physical Exam VITAL SIGNS:  See vs page GENERAL: no distress Neck: small multinodular goiter, with dominant 2-3 cm right lobe nodule.  No palpable nodes.   Lab Results  Component Value Date   TSH 1.21 09/15/2013      Assessment & Plan:  Multinodular goiter: clinically unchanged. Hyperthyroidism: due to the goiter, resolved with I-131 rx.  Patient Instructions  blood tests are being requested for you today.  We'll contact you with results. Please come back for a follow-up appointment in 6-8 weeks.

## 2013-09-15 NOTE — Patient Instructions (Signed)
blood tests are being requested for you today.  We'll contact you with results. Please come back for a follow-up appointment in 6-8 weeks.   

## 2013-09-17 ENCOUNTER — Encounter: Payer: Self-pay | Admitting: Gastroenterology

## 2013-10-06 ENCOUNTER — Telehealth: Payer: Self-pay | Admitting: Gastroenterology

## 2013-10-06 NOTE — Telephone Encounter (Signed)
Pt received path letter that states adenomatous polyp with 5 year recall. Pt thought she was supposed to have another colon done in 3-6 mths if they were precancerous. Pt would like to make sure she does not need colon done sooner than 5 years. Please advise.

## 2013-10-07 NOTE — Telephone Encounter (Signed)
Please explained that the multiple polyps in the sigmoid and rectum were hyperplastic polyps and not precancerous.  Therefore 5 year followup is recommended.  The polyp in the descending colon was precancerous and is the basis for the five-year recommendations.

## 2013-10-07 NOTE — Telephone Encounter (Signed)
Spoke with pt and she is aware.

## 2013-10-27 ENCOUNTER — Encounter: Payer: Self-pay | Admitting: Endocrinology

## 2013-10-27 ENCOUNTER — Ambulatory Visit (INDEPENDENT_AMBULATORY_CARE_PROVIDER_SITE_OTHER): Payer: Commercial Managed Care - HMO | Admitting: Endocrinology

## 2013-10-27 VITALS — BP 126/70 | HR 63 | Temp 98.1°F | Ht 62.0 in | Wt 166.0 lb

## 2013-10-27 DIAGNOSIS — E042 Nontoxic multinodular goiter: Secondary | ICD-10-CM

## 2013-10-27 LAB — TSH: TSH: 1.2 u[IU]/mL (ref 0.35–4.50)

## 2013-10-27 LAB — T4, FREE: Free T4: 0.67 ng/dL (ref 0.60–1.60)

## 2013-10-27 NOTE — Progress Notes (Signed)
Subjective:    Patient ID: [name], female    DOB: 1961/01/06, 53 y.o.   MRN: [id number]  HPI Pt returns for f/u of multinodular goiter (dx'ed 2014; it was found on [place], which had been done to investigate lymphadenopathy of the neck; the nodes appeared benign, but goiter was incidentally noted; bx was c/w benign follicular nodule; she was noted in early 2015 to have suppressed TSH, and she received I-131 rx in April of 2015).  pt states she feels no different, and well in general.   Past Medical History  Diagnosis Date  . Back pain, chronic   . Depression   . Myocardial infarction 11/20/06  . Hypertension   . Hypercholesteremia   . Coronary artery disease   . Allergy   . Arthritis   . Neuromuscular disorder   . Bronchitis     hx-no inhalers at home  . Diabetes mellitus     diet controlled  . DDD (degenerative disc disease), lumbar   . Ejection fraction     .  Marland Kitchen Hx of CABG   . Carotid bruit   . Nodule of neck   . Multiple thyroid nodules     Past Surgical History  Procedure Laterality Date  . Mandible surgery  2008  . Knee surgery  2009    lt   . Tubal ligation    . Breast surgery      rt br cystx2  . Abdominal hysterectomy  1984  . Coronary artery bypass graft  2008  . Orif ankle fracture Right 07/12/2012    Procedure: OPEN REDUCTION INTERNAL FIXATION (ORIF) RIGHT ANKLE FRACTURE;  Surgeon: Alta Corning, MD;  Location: [facility];  Service: Orthopedics;  Laterality: Right;    History   Social History  . Marital Status: Married    Spouse Name: N/A    Number of Children: N/A  . Years of Education: N/A   Occupational History  . Not on file.   Social History Main Topics  . Smoking status: Current Every Day Smoker -- 0.50 packs/day    Types: Cigarettes  . Smokeless tobacco: Never Used  . Alcohol Use: No  . Drug Use: No  . Sexual Activity: Not on file   Other Topics Concern  . Not on file   Social History Narrative  . No  narrative on file    Current Outpatient Prescriptions on File Prior to Visit  Medication Sig Dispense Refill  . aspirin 81 MG tablet Take 81 mg by mouth 2 (two) times daily.       . carvedilol (COREG) 3.125 MG tablet Take 1 tablet (3.125 mg total) by mouth 2 (two) times daily.  60 tablet  5  . DULoxetine (CYMBALTA) 60 MG capsule Take 1 capsule (60 mg total) by mouth daily.  30 capsule  5  . fish oil-omega-3 fatty acids 1000 MG capsule Take 2 g by mouth daily.        Marland Kitchen gabapentin (NEURONTIN) 400 MG capsule Take 2 capsules (800 mg total) by mouth 3 (three) times daily.  180 capsule  2  . oxyCODONE-acetaminophen (PERCOCET/ROXICET) 5-325 MG per tablet Take 1-2 tablets by mouth every 4 (four) hours as needed for moderate pain.  150 tablet  0  . QUEtiapine (SEROQUEL XR) 300 MG 24 hr tablet Take 1 tablet (300 mg total) by mouth at bedtime. 300 mg + 50 mg=350 mg  30 tablet  5  . QUEtiapine (SEROQUEL XR) 50 MG  TB24 24 hr tablet Take 1 tablet (50 mg total) by mouth at bedtime. 50 mg + 300 mg=350 mg  30 each  5  . ranitidine (ZANTAC) 150 MG tablet Take 1 tablet (150 mg total) by mouth 2 (two) times daily. PATIENT NEEDS OFFICE VISIT FOR ADDITIONAL REFILLS  60 tablet  5  . rosuvastatin (CRESTOR) 20 MG tablet Take 1 tablet (20 mg total) by mouth daily.  30 tablet  5  . traZODone (DESYREL) 50 MG tablet TAKE ONE TABLET EVERY NIGHT AT BEDTIME AS NEEDED FOR SLEEP  30 tablet  5  . zolpidem (AMBIEN) 10 MG tablet 5 mg.       . zolpidem (AMBIEN) 5 MG tablet Take 1 tablet (5 mg total) by mouth at bedtime as needed for sleep.  30 tablet  5   No current facility-administered medications on file prior to visit.    Allergies  Allergen Reactions  . Ibuprofen Swelling and Rash  . Prednisone Swelling and Rash  . Morphine Nausea And Vomiting    REACTION: Nausea  . Coconut Oil Swelling and Rash    Family History  Problem Relation Age of Onset  . Heart disease Mother   . Hyperlipidemia Mother   . Hypertension  Mother   . Heart disease Father   . Stroke Father   . Hypertension Father   . Hyperlipidemia Father   . Hypertension Sister   . Hypertension Brother   . Cancer Daughter     unknown  . Hypertension Maternal Grandmother   . Hypertension Maternal Grandfather   . Cancer Maternal Grandfather   . Hypertension Paternal Grandmother   . Hypertension Paternal Grandfather   . Cancer Maternal Aunt     breast and ovarian  . Cancer Paternal Aunt     breast  . Colon cancer Neg Hx     BP 126/70  Pulse 63  Temp(Src) 98.1 F (36.7 C) (Oral)  Ht 5\' 2"  (1.575 m)  Wt 166 lb (75.297 kg)  BMI 30.35 kg/m2  SpO2 91%   Review of Systems Denies weight change    Objective:   Physical Exam VITAL SIGNS:  See vs page GENERAL: no distress Neck: small multinodular goiter, with dominant 2-3 cm right lobe nodule.       Assessment & Plan:  Multinodular goiter, clinically unchanged. Hyperthyroidism, s/p i-131 rx.  Patient is advised the following: Patient Instructions  blood tests are being requested for you today.  We'll contact you with results. Please come back for a follow-up appointment in 6-8 weeks.

## 2013-10-27 NOTE — Patient Instructions (Signed)
blood tests are being requested for you today.  We'll contact you with results. Please come back for a follow-up appointment in 6-8 weeks.

## 2013-12-03 ENCOUNTER — Ambulatory Visit (INDEPENDENT_AMBULATORY_CARE_PROVIDER_SITE_OTHER): Payer: Commercial Managed Care - HMO | Admitting: Family Medicine

## 2013-12-03 VITALS — BP 120/68 | HR 64 | Temp 98.1°F | Resp 16 | Ht 62.0 in | Wt 162.6 lb

## 2013-12-03 DIAGNOSIS — E0590 Thyrotoxicosis, unspecified without thyrotoxic crisis or storm: Secondary | ICD-10-CM

## 2013-12-03 DIAGNOSIS — R7303 Prediabetes: Secondary | ICD-10-CM

## 2013-12-03 DIAGNOSIS — E042 Nontoxic multinodular goiter: Secondary | ICD-10-CM

## 2013-12-03 DIAGNOSIS — I8003 Phlebitis and thrombophlebitis of superficial vessels of lower extremities, bilateral: Secondary | ICD-10-CM

## 2013-12-03 DIAGNOSIS — M48061 Spinal stenosis, lumbar region without neurogenic claudication: Secondary | ICD-10-CM

## 2013-12-03 DIAGNOSIS — M549 Dorsalgia, unspecified: Secondary | ICD-10-CM

## 2013-12-03 DIAGNOSIS — G8929 Other chronic pain: Secondary | ICD-10-CM

## 2013-12-03 DIAGNOSIS — R7309 Other abnormal glucose: Secondary | ICD-10-CM

## 2013-12-03 DIAGNOSIS — E7800 Pure hypercholesterolemia, unspecified: Secondary | ICD-10-CM

## 2013-12-03 DIAGNOSIS — I10 Essential (primary) hypertension: Secondary | ICD-10-CM

## 2013-12-03 DIAGNOSIS — I8000 Phlebitis and thrombophlebitis of superficial vessels of unspecified lower extremity: Secondary | ICD-10-CM

## 2013-12-03 MED ORDER — CARVEDILOL 3.125 MG PO TABS: 3.1250 mg | ORAL_TABLET | Freq: Two times a day (BID) | ORAL | Status: DC

## 2013-12-03 MED ORDER — DULOXETINE HCL 60 MG PO CPEP: 60.0000 mg | ORAL_CAPSULE | Freq: Every day | ORAL | Status: DC

## 2013-12-03 MED ORDER — OXYCODONE-ACETAMINOPHEN 5-325 MG PO TABS: 1.0000 | ORAL_TABLET | ORAL | Status: DC | PRN

## 2013-12-03 MED ORDER — TRAZODONE HCL 50 MG PO TABS: ORAL_TABLET | ORAL | Status: DC

## 2013-12-03 MED ORDER — ROSUVASTATIN CALCIUM 20 MG PO TABS: 20.0000 mg | ORAL_TABLET | Freq: Every day | ORAL | Status: DC

## 2013-12-03 MED ORDER — RANITIDINE HCL 150 MG PO TABS: 150.0000 mg | ORAL_TABLET | Freq: Two times a day (BID) | ORAL | Status: DC

## 2013-12-03 MED ORDER — GABAPENTIN 400 MG PO CAPS: 800.0000 mg | ORAL_CAPSULE | Freq: Three times a day (TID) | ORAL | Status: DC

## 2013-12-03 NOTE — Progress Notes (Signed)
This chart was scribed for [name], MD by Einar Pheasant, ED Scribe. This patient was seen in room 1 and the patient's care was started at 6:44 PM.  Subjective:    Patient ID: [name], female    DOB: May 13, 1960, 53 y.o.   MRN: [id number]  Chief Complaint  Patient presents with  . Follow-up    here to see Dr. Brigitte Pulse for medication refills    HPI [name] is a 53 y.o. Female  Today pt is here for medication refill. For the past several month she has been followed by Dr. Loanne Drilling who has been treating her for multinodular goiter and hyperthyroidism which resolved with iodine radiation. Her thyroid levels have been normal at the last several checks and she is not requiring any thyroid medication.   Pt states that she is due for a check up with Dr. Loanne Drilling in two weeks. She states that she is scheduling her mammogram and pap smear soon. Pt states that they discontinued the Ambien and now she is on Trazodone and Seroquel which she states are working well. She states that these past weeks she has been able to have a full night of sleep.   [name] states that her pain level secondary to her history of chronic back pain has been "okay". She states that she hasn't been able to make it to the gym regularly lately, but she tries to go every once in a while. She states that her pain medicine works, with occasional increased doses at times. Pt states that she wants to hold off on switching her pain medication. She denies any other pertinent medical history.  Patient Active Problem List   Diagnosis Date Noted  . Multinodular goiter 09/15/2013  . Hyperthyroidism 07/11/2013  . Carotid bruit   . Depression   . Coronary artery disease   . Hypertension   . Hypercholesteremia   . Neuromuscular disorder   . Arthritis   . DDD (degenerative disc disease), lumbar   . Ejection fraction   . Hx of CABG   . Glucose intolerance (pre-diabetes) 02/08/2012  . Encounter for  long-term (current) use of other medications 02/08/2012  . Spinal stenosis of lumbar region 02/08/2012  . Neuropathy 02/08/2012  . Superficial phlebitis and thrombophlebitis of the leg 04/09/2011  . Back pain, chronic   . TOBACCO ABUSE 11/20/2008   Past Medical History  Diagnosis Date  . Back pain, chronic   . Depression   . Myocardial infarction 11/20/06  . Hypertension   . Hypercholesteremia   . Coronary artery disease   . Allergy   . Arthritis   . Neuromuscular disorder   . Bronchitis     hx-no inhalers at home  . Diabetes mellitus     diet controlled  . DDD (degenerative disc disease), lumbar   . Ejection fraction     .  Marland Kitchen Hx of CABG   . Carotid bruit   . Nodule of neck   . Multiple thyroid nodules    Past Surgical History  Procedure Laterality Date  . Mandible surgery  2008  . Knee surgery  2009    lt   . Tubal ligation    . Breast surgery      rt br cystx2  . Abdominal hysterectomy  1984  . Coronary artery bypass graft  2008  . Orif ankle fracture Right 07/12/2012    Procedure: OPEN REDUCTION INTERNAL FIXATION (ORIF) RIGHT ANKLE FRACTURE;  Surgeon: Karen Chafe  Berenice Primas, MD;  Location: [facility];  Service: Orthopedics;  Laterality: Right;   Allergies  Allergen Reactions  . Ibuprofen Swelling and Rash  . Prednisone Swelling and Rash  . Morphine Nausea And Vomiting    REACTION: Nausea  . Coconut Oil Swelling and Rash   Prior to Admission medications   Medication Sig Start Date End Date Taking? Authorizing Provider  aspirin 81 MG tablet Take 81 mg by mouth 2 (two) times daily.    Yes Historical Provider, MD  carvedilol (COREG) 3.125 MG tablet Take 1 tablet (3.125 mg total) by mouth 2 (two) times daily. 09/12/13  Yes Shawnee Knapp, MD  DULoxetine (CYMBALTA) 60 MG capsule Take 1 capsule (60 mg total) by mouth daily. 07/17/13  Yes Ellison Carwin, MD  fish oil-omega-3 fatty acids 1000 MG capsule Take 2 g by mouth daily.     Yes Historical Provider, MD    gabapentin (NEURONTIN) 400 MG capsule Take 2 capsules (800 mg total) by mouth 3 (three) times daily. 12/20/12  Yes Shawnee Knapp, MD  oxyCODONE-acetaminophen (PERCOCET/ROXICET) 5-325 MG per tablet Take 1-2 tablets by mouth every 4 (four) hours as needed for moderate pain. 09/12/13  Yes Shawnee Knapp, MD  QUEtiapine (SEROQUEL) 400 MG tablet Take 400 mg by mouth at bedtime.   Yes Historical Provider, MD  ranitidine (ZANTAC) 150 MG tablet Take 1 tablet (150 mg total) by mouth 2 (two) times daily. PATIENT NEEDS OFFICE VISIT FOR ADDITIONAL REFILLS 07/17/13  Yes Ellison Carwin, MD  rosuvastatin (CRESTOR) 20 MG tablet Take 1 tablet (20 mg total) by mouth daily. 09/12/13  Yes Shawnee Knapp, MD  traZODone (DESYREL) 50 MG tablet TAKE ONE TABLET EVERY NIGHT AT BEDTIME AS NEEDED FOR SLEEP 07/17/13  Yes Ellison Carwin, MD  QUEtiapine (SEROQUEL XR) 300 MG 24 hr tablet Take 300 mg by mouth at bedtime. 07/17/13 12/03/13  Ellison Carwin, MD  QUEtiapine (SEROQUEL XR) 50 MG TB24 24 hr tablet Take 100 mg by mouth at bedtime. 50 mg + 300 mg=350 mg 07/17/13 12/03/13  Ellison Carwin, MD   History   Social History  . Marital Status: Married    Spouse Name: N/A    Number of Children: N/A  . Years of Education: N/A   Occupational History  . Not on file.   Social History Main Topics  . Smoking status: Current Every Day Smoker -- 0.50 packs/day    Types: Cigarettes  . Smokeless tobacco: Never Used  . Alcohol Use: No  . Drug Use: No  . Sexual Activity: Not on file   Other Topics Concern  . Not on file   Social History Narrative  . No narrative on file     Review of Systems  Constitutional: Negative for fatigue and unexpected weight change.  Respiratory: Negative for chest tightness and shortness of breath.   Cardiovascular: Negative for chest pain, palpitations and leg swelling.  Gastrointestinal: Negative for abdominal pain and blood in stool.  Neurological: Negative for dizziness, syncope, light-headedness and  headaches.      Vital Signs: BP 120/68  Pulse 64  Temp(Src) 98.1 F (36.7 C) (Oral)  Resp 16  Ht 5\' 2"  (1.575 m)  Wt 162 lb 9.6 oz (73.755 kg)  BMI 29.73 kg/m2  SpO2 91%  Objective:   Physical Exam  Nursing note and vitals reviewed. Constitutional: She is oriented to person, place, and time. She appears well-developed and well-nourished. No distress.  HENT:  Head: Normocephalic and atraumatic.  Eyes:  Conjunctivae and EOM are normal.  Neck: Neck supple.  Cardiovascular: Normal rate.   Pulmonary/Chest: Effort normal. No respiratory distress.  Musculoskeletal: Normal range of motion.  Neurological: She is alert and oriented to person, place, and time.  Skin: Skin is warm and dry.  Psychiatric: She has a normal mood and affect. Her behavior is normal.   Assessment & Plan:   6:54 PM--Advised pt to return in approximately 3 months, fasting, for future labs.   Superficial phlebitis and thrombophlebitis of the leg, bilateral  Spinal stenosis of lumbar region  Hyperthyroidism  Multinodular goiter  Essential hypertension  Hypercholesteremia  Glucose intolerance (pre-diabetes)  Back pain, chronic  Meds ordered this encounter  Medications  . QUEtiapine (SEROQUEL) 400 MG tablet    Sig: Take 400 mg by mouth at bedtime.  Marland Kitchen QUEtiapine (SEROQUEL XR) 50 MG TB24 24 hr tablet    Sig: Take 100 mg by mouth at bedtime. 50 mg + 300 mg=350 mg  . QUEtiapine (SEROQUEL XR) 300 MG 24 hr tablet    Sig: Take 300 mg by mouth at bedtime.  . traZODone (DESYREL) 50 MG tablet    Sig: TAKE ONE TABLET EVERY NIGHT AT BEDTIME AS NEEDED FOR SLEEP    Dispense:  30 tablet    Refill:  5  . carvedilol (COREG) 3.125 MG tablet    Sig: Take 1 tablet (3.125 mg total) by mouth 2 (two) times daily.    Dispense:  60 tablet    Refill:  5  . DULoxetine (CYMBALTA) 60 MG capsule    Sig: Take 1 capsule (60 mg total) by mouth daily.    Dispense:  30 capsule    Refill:  5  . gabapentin (NEURONTIN) 400 MG  capsule    Sig: Take 2 capsules (800 mg total) by mouth 3 (three) times daily.    Dispense:  180 capsule    Refill:  2  . ranitidine (ZANTAC) 150 MG tablet    Sig: Take 1 tablet (150 mg total) by mouth 2 (two) times daily. PATIENT NEEDS OFFICE VISIT FOR ADDITIONAL REFILLS    Dispense:  60 tablet    Refill:  5  . rosuvastatin (CRESTOR) 20 MG tablet    Sig: Take 1 tablet (20 mg total) by mouth daily.    Dispense:  30 tablet    Refill:  5  . DISCONTD: oxyCODONE-acetaminophen (PERCOCET/ROXICET) 5-325 MG per tablet    Sig: Take 1-2 tablets by mouth every 4 (four) hours as needed for moderate pain.    Dispense:  150 tablet    Refill:  0    May fill on or after 12/08/13.  Marland Kitchen DISCONTD: oxyCODONE-acetaminophen (PERCOCET/ROXICET) 5-325 MG per tablet    Sig: Take 1-2 tablets by mouth every 4 (four) hours as needed for moderate pain.    Dispense:  150 tablet    Refill:  0    May fill on or after 01/08/14.  Marland Kitchen oxyCODONE-acetaminophen (PERCOCET/ROXICET) 5-325 MG per tablet    Sig: Take 1-2 tablets by mouth every 4 (four) hours as needed for moderate pain.    Dispense:  150 tablet    Refill:  0    May fill on or after 02/07/14.    I personally performed the services described in this documentation, which was scribed in my presence. The recorded information has been reviewed and considered, and addended by me as needed.  [name], MD MPH

## 2013-12-16 ENCOUNTER — Ambulatory Visit (INDEPENDENT_AMBULATORY_CARE_PROVIDER_SITE_OTHER): Payer: Commercial Managed Care - HMO | Admitting: Family Medicine

## 2013-12-16 ENCOUNTER — Ambulatory Visit (INDEPENDENT_AMBULATORY_CARE_PROVIDER_SITE_OTHER): Payer: Commercial Managed Care - HMO

## 2013-12-16 VITALS — BP 118/60 | HR 78 | Temp 98.2°F | Resp 17 | Ht 62.5 in | Wt 163.0 lb

## 2013-12-16 DIAGNOSIS — F17200 Nicotine dependence, unspecified, uncomplicated: Secondary | ICD-10-CM

## 2013-12-16 DIAGNOSIS — R0602 Shortness of breath: Secondary | ICD-10-CM

## 2013-12-16 DIAGNOSIS — J209 Acute bronchitis, unspecified: Secondary | ICD-10-CM

## 2013-12-16 LAB — POCT CBC
Granulocyte percent: 55.5 %G (ref 37–80)
HEMATOCRIT: 47.6 % (ref 37.7–47.9)
Hemoglobin: 15.4 g/dL (ref 12.2–16.2)
LYMPH, POC: 2.7 (ref 0.6–3.4)
MCH, POC: 29.1 pg (ref 27–31.2)
MCHC: 32.3 g/dL (ref 31.8–35.4)
MCV: 89.9 fL (ref 80–97)
MID (cbc): 0.6 (ref 0–0.9)
MPV: 7.7 fL (ref 0–99.8)
POC Granulocyte: 4.2 (ref 2–6.9)
POC LYMPH PERCENT: 36.6 %L (ref 10–50)
POC MID %: 7.9 %M (ref 0–12)
Platelet Count, POC: 178 10*3/uL (ref 142–424)
RBC: 5.3 M/uL (ref 4.04–5.48)
RDW, POC: 14.1 %
WBC: 7.5 10*3/uL (ref 4.6–10.2)

## 2013-12-16 MED ORDER — IPRATROPIUM-ALBUTEROL 0.5-2.5 (3) MG/3ML IN SOLN
3.0000 mL | RESPIRATORY_TRACT | Status: DC
  Administered 2013-12-16: 3 mL via RESPIRATORY_TRACT

## 2013-12-16 MED ORDER — ALBUTEROL SULFATE (2.5 MG/3ML) 0.083% IN NEBU: 2.5000 mg | INHALATION_SOLUTION | Freq: Once | RESPIRATORY_TRACT | Status: DC

## 2013-12-16 MED ORDER — IPRATROPIUM-ALBUTEROL 20-100 MCG/ACT IN AERS: 1.0000 | INHALATION_SPRAY | Freq: Four times a day (QID) | RESPIRATORY_TRACT | Status: DC

## 2013-12-16 MED ORDER — IPRATROPIUM BROMIDE 0.02 % IN SOLN: 0.5000 mg | Freq: Once | RESPIRATORY_TRACT | Status: DC

## 2013-12-16 MED ORDER — AZITHROMYCIN 250 MG PO TABS: ORAL_TABLET | ORAL | Status: DC

## 2013-12-16 MED ORDER — HYDROCOD POLST-CHLORPHEN POLST 10-8 MG/5ML PO LQCR: 5.0000 mL | Freq: Two times a day (BID) | ORAL | Status: DC | PRN

## 2013-12-16 NOTE — Patient Instructions (Signed)

## 2013-12-16 NOTE — Progress Notes (Signed)
Subjective:    Patient ID: [name], female    DOB: 25-Apr-1961, 53 y.o.   MRN: [id number] Chief Complaint  Patient presents with  . Cough  . Laryngitis  . Shortness of Breath    HPI Been ill with worsening sxs x 1 wk.  Past Medical History  Diagnosis Date  . Back pain, chronic   . Depression   . Myocardial infarction 11/20/06  . Hypertension   . Hypercholesteremia   . Coronary artery disease   . Allergy   . Arthritis   . Neuromuscular disorder   . Bronchitis     hx-no inhalers at home  . Diabetes mellitus     diet controlled  . DDD (degenerative disc disease), lumbar   . Ejection fraction     .  Marland Kitchen Hx of CABG   . Carotid bruit   . Nodule of neck   . Multiple thyroid nodules    Current Outpatient Prescriptions on File Prior to Visit  Medication Sig Dispense Refill  . aspirin 81 MG tablet Take 81 mg by mouth 2 (two) times daily.       . carvedilol (COREG) 3.125 MG tablet Take 1 tablet (3.125 mg total) by mouth 2 (two) times daily.  60 tablet  5  . DULoxetine (CYMBALTA) 60 MG capsule Take 1 capsule (60 mg total) by mouth daily.  30 capsule  5  . fish oil-omega-3 fatty acids 1000 MG capsule Take 2 g by mouth daily.        Marland Kitchen gabapentin (NEURONTIN) 400 MG capsule Take 2 capsules (800 mg total) by mouth 3 (three) times daily.  180 capsule  2  . oxyCODONE-acetaminophen (PERCOCET/ROXICET) 5-325 MG per tablet Take 1-2 tablets by mouth every 4 (four) hours as needed for moderate pain.  150 tablet  0  . QUEtiapine (SEROQUEL) 400 MG tablet Take 400 mg by mouth at bedtime.      . ranitidine (ZANTAC) 150 MG tablet Take 1 tablet (150 mg total) by mouth 2 (two) times daily. PATIENT NEEDS OFFICE VISIT FOR ADDITIONAL REFILLS  60 tablet  5  . rosuvastatin (CRESTOR) 20 MG tablet Take 1 tablet (20 mg total) by mouth daily.  30 tablet  5  . traZODone (DESYREL) 50 MG tablet TAKE ONE TABLET EVERY NIGHT AT BEDTIME AS NEEDED FOR SLEEP  30 tablet  5   No current  facility-administered medications on file prior to visit.   Allergies  Allergen Reactions  . Ibuprofen Swelling and Rash  . Prednisone Swelling and Rash  . Morphine Nausea And Vomiting    REACTION: Nausea  . Coconut Oil Swelling and Rash      Review of Systems  HENT: Positive for congestion, postnasal drip, rhinorrhea, sinus pressure, sore throat and voice change. Negative for ear discharge, ear pain and trouble swallowing.   Respiratory: Positive for cough, chest tightness, shortness of breath and wheezing.   Cardiovascular: Negative for chest pain and palpitations.  Skin: Negative for rash.  Hematological: Bruises/bleeds easily.      BP 118/60  Pulse 78  Temp(Src) 98.2 F (36.8 C) (Oral)  Resp 17  Ht 5' 2.5" (1.588 m)  Wt 163 lb (73.936 kg)  BMI 29.32 kg/m2  SpO2 92% Objective:   Physical Exam        Results for orders placed in visit on 12/16/13  POCT CBC      Result Value Ref Range   WBC 7.5  4.6 - 10.2 K/uL  Lymph, poc 2.7  0.6 - 3.4   POC LYMPH PERCENT 36.6  10 - 50 %L   MID (cbc) 0.6  0 - 0.9   POC MID % 7.9  0 - 12 %M   POC Granulocyte 4.2  2 - 6.9   Granulocyte percent 55.5  37 - 80 %G   RBC 5.30  4.04 - 5.48 M/uL   Hemoglobin 15.4  12.2 - 16.2 g/dL   HCT, POC 47.6  37.7 - 47.9 %   MCV 89.9  80 - 97 fL   MCH, POC 29.1  27 - 31.2 pg   MCHC 32.3  31.8 - 35.4 g/dL   RDW, POC 14.1     Platelet Count, POC 178  142 - 424 K/uL   MPV 7.7  0 - 99.8 fL   [facility] reading (PRIMARY) by  Dr. Brigitte Pulse. CXR: no acute abnormality  Assessment & Plan:   TOBACCO ABUSE - Plan: POCT CBC, DG Chest 2 View, DISCONTINUED: albuterol (PROVENTIL) (2.5 MG/3ML) 0.083% nebulizer solution 2.5 mg, DISCONTINUED: ipratropium (ATROVENT) nebulizer solution 0.5 mg  SOB (shortness of breath) - Plan: DISCONTINUED: ipratropium-albuterol (DUONEB) 0.5-2.5 (3) MG/3ML nebulizer solution 3 mL  Acute bronchitis, unspecified organism  Meds ordered this encounter  Medications  . DISCONTD:  albuterol (PROVENTIL) (2.5 MG/3ML) 0.083% nebulizer solution 2.5 mg    Sig:   . DISCONTD: ipratropium (ATROVENT) nebulizer solution 0.5 mg    Sig:   . DISCONTD: ipratropium-albuterol (DUONEB) 0.5-2.5 (3) MG/3ML nebulizer solution 3 mL    Sig:   . azithromycin (ZITHROMAX) 250 MG tablet    Sig: Take 2 tabs PO x 1 dose, then 1 tab PO QD x 4 days    Dispense:  6 tablet    Refill:  0  . chlorpheniramine-HYDROcodone (TUSSIONEX PENNKINETIC ER) 10-8 MG/5ML LQCR    Sig: Take 5 mLs by mouth every 12 (twelve) hours as needed.    Dispense:  120 mL    Refill:  0  . Ipratropium-Albuterol (COMBIVENT RESPIMAT) 20-100 MCG/ACT AERS respimat    Sig: Inhale 1 puff into the lungs every 6 (six) hours.    Dispense:  4 g    Refill:  1    [name], MD MPH

## 2014-01-02 ENCOUNTER — Ambulatory Visit (INDEPENDENT_AMBULATORY_CARE_PROVIDER_SITE_OTHER): Payer: Commercial Managed Care - HMO | Admitting: Cardiology

## 2014-01-02 ENCOUNTER — Encounter: Payer: Self-pay | Admitting: Cardiology

## 2014-01-02 VITALS — BP 104/60 | HR 62 | Ht 62.5 in | Wt 165.8 lb

## 2014-01-02 DIAGNOSIS — R0989 Other specified symptoms and signs involving the circulatory and respiratory systems: Secondary | ICD-10-CM | POA: Diagnosis not present

## 2014-01-02 DIAGNOSIS — I2510 Atherosclerotic heart disease of native coronary artery without angina pectoris: Secondary | ICD-10-CM

## 2014-01-02 DIAGNOSIS — F17200 Nicotine dependence, unspecified, uncomplicated: Secondary | ICD-10-CM | POA: Diagnosis not present

## 2014-01-02 DIAGNOSIS — E7800 Pure hypercholesterolemia, unspecified: Secondary | ICD-10-CM

## 2014-01-02 DIAGNOSIS — E0590 Thyrotoxicosis, unspecified without thyrotoxic crisis or storm: Secondary | ICD-10-CM

## 2014-01-02 NOTE — Patient Instructions (Signed)
**Note De-identified  Obfuscation** Your physician recommends that you continue on your current medications as directed. Please refer to the Current Medication list given to you today.  Your physician wants you to follow-up in: 1 year. You will receive a reminder letter in the mail two months in advance. If you don't receive a letter, please call our office to schedule the follow-up appointment.  

## 2014-01-02 NOTE — Assessment & Plan Note (Signed)
I counseled the patient again to stop smoking. She says that she does try.

## 2014-01-02 NOTE — Assessment & Plan Note (Signed)
We know that her carotids looked quite good in August, 2014.

## 2014-01-02 NOTE — Assessment & Plan Note (Signed)
Coronary disease is stable. Nuclear scan September, 2014 showed no ischemia. Ejection fraction was normal. No further workup.

## 2014-01-02 NOTE — Assessment & Plan Note (Signed)
She is on guideline directed therapy. No change in therapy.

## 2014-01-02 NOTE — Assessment & Plan Note (Signed)
Her thyroid is being treated appropriately. No change in therapy.

## 2014-01-02 NOTE — Progress Notes (Signed)
Patient ID: [name], female   DOB: 1961-03-23, 53 y.o.   MRN: [id number]    HPI  Patient is seen today to followup coronary disease. I saw her last August, 2014. She underwent bypass surgery in 2008. Historically her ejection fraction is normal. Decision was made to proceed with a stress nuclear study in September, 2014. This showed no significant abnormality and LV function was good. A carotid Doppler was done. The carotid arteries were stable. However abnormalities in the thyroid were noted. Since that time she's had very careful and complete evaluation and treatment of this. She actually received radioactive iodine and she is getting very careful followup. She's not having any significant chest pain. Unfortunately she does continue to smoke.  Allergies  Allergen Reactions  . Ibuprofen Swelling and Rash  . Prednisone Swelling and Rash  . Morphine Nausea And Vomiting    REACTION: Nausea  . Coconut Oil Swelling and Rash    Current Outpatient Prescriptions  Medication Sig Dispense Refill  . aspirin 81 MG tablet Take 81 mg by mouth 2 (two) times daily.       . carvedilol (COREG) 3.125 MG tablet Take 1 tablet (3.125 mg total) by mouth 2 (two) times daily.  60 tablet  5  . DULoxetine (CYMBALTA) 60 MG capsule Take 1 capsule (60 mg total) by mouth daily.  30 capsule  5  . fish oil-omega-3 fatty acids 1000 MG capsule Take 2 g by mouth daily.        Marland Kitchen gabapentin (NEURONTIN) 400 MG capsule Take 2 capsules (800 mg total) by mouth 3 (three) times daily.  180 capsule  2  . Ipratropium-Albuterol (COMBIVENT RESPIMAT) 20-100 MCG/ACT AERS respimat Inhale 1 puff into the lungs every 6 (six) hours.  4 g  1  . oxyCODONE-acetaminophen (PERCOCET/ROXICET) 5-325 MG per tablet Take 1-2 tablets by mouth every 4 (four) hours as needed for moderate pain.  150 tablet  0  . QUEtiapine (SEROQUEL) 400 MG tablet Take 400 mg by mouth at bedtime.      . ranitidine (ZANTAC) 150 MG tablet Take 1 tablet (150  mg total) by mouth 2 (two) times daily. PATIENT NEEDS OFFICE VISIT FOR ADDITIONAL REFILLS  60 tablet  5  . rosuvastatin (CRESTOR) 20 MG tablet Take 1 tablet (20 mg total) by mouth daily.  30 tablet  5  . traZODone (DESYREL) 50 MG tablet TAKE ONE TABLET EVERY NIGHT AT BEDTIME AS NEEDED FOR SLEEP  30 tablet  5   No current facility-administered medications for this visit.    History   Social History  . Marital Status: Married    Spouse Name: N/A    Number of Children: N/A  . Years of Education: N/A   Occupational History  . Not on file.   Social History Main Topics  . Smoking status: Current Every Day Smoker -- 0.50 packs/day for 29 years    Types: Cigarettes  . Smokeless tobacco: Never Used  . Alcohol Use: No  . Drug Use: No  . Sexual Activity: Not on file   Other Topics Concern  . Not on file   Social History Narrative  . No narrative on file    Family History  Problem Relation Age of Onset  . Heart disease Mother   . Hyperlipidemia Mother   . Hypertension Mother   . Heart disease Father   . Stroke Father   . Hypertension Father   . Hyperlipidemia Father   . Hypertension Sister   .  Hypertension Brother   . Cancer Daughter     unknown  . Hypertension Maternal Grandmother   . Hypertension Maternal Grandfather   . Cancer Maternal Grandfather   . Hypertension Paternal Grandmother   . Hypertension Paternal Grandfather   . Cancer Maternal Aunt     breast and ovarian  . Cancer Paternal Aunt     breast  . Colon cancer Neg Hx     Past Medical History  Diagnosis Date  . Back pain, chronic   . Depression   . Myocardial infarction 11/20/06  . Hypertension   . Hypercholesteremia   . Coronary artery disease   . Allergy   . Arthritis   . Neuromuscular disorder   . Bronchitis     hx-no inhalers at home  . Diabetes mellitus     diet controlled  . DDD (degenerative disc disease), lumbar   . Ejection fraction     .  Marland Kitchen Hx of CABG   . Carotid bruit   . Nodule  of neck   . Multiple thyroid nodules     Past Surgical History  Procedure Laterality Date  . Mandible surgery  2008  . Knee surgery  2009    lt   . Tubal ligation    . Breast surgery      rt br cystx2  . Abdominal hysterectomy  1984  . Coronary artery bypass graft  2008  . Orif ankle fracture Right 07/12/2012    Procedure: OPEN REDUCTION INTERNAL FIXATION (ORIF) RIGHT ANKLE FRACTURE;  Surgeon: Alta Corning, MD;  Location: [place] [facility];  Service: Orthopedics;  Laterality: Right;    Patient Active Problem List   Diagnosis Date Noted  . Multinodular goiter 09/15/2013  . Hyperthyroidism 07/11/2013  . Carotid bruit   . Depression   . Coronary artery disease   . Hypertension   . Hypercholesteremia   . Neuromuscular disorder   . Arthritis   . DDD (degenerative disc disease), lumbar   . Ejection fraction   . Hx of CABG   . Glucose intolerance (pre-diabetes) 02/08/2012  . Encounter for long-term (current) use of other medications 02/08/2012  . Spinal stenosis of lumbar region 02/08/2012  . Neuropathy 02/08/2012  . Superficial phlebitis and thrombophlebitis of the leg 04/09/2011  . Back pain, chronic   . TOBACCO ABUSE 11/20/2008    ROS   Patient denies fever, chills, headache, sweats, rash, change in vision, change in hearing, chest pain, cough, nausea or vomiting, urinary symptoms. All other systems are reviewed and are negative.  PHYSICAL EXAM  Patient is oriented to person time and place. Affect is normal. Head is atraumatic. Sclera and conjunctiva are normal. There is no jugulovenous distention. Lungs are clear. Respiratory effort is nonlabored. Cardiac exam reveals S1 and S2. Abdomen is soft. There is no peripheral edema. There no musculoskeletal deformities. There are no skin rashes.  Filed Vitals:   01/02/14 0905  BP: 104/60  Pulse: 62  Height: 5' 2.5" (1.588 m)  Weight: 165 lb 12.8 oz (75.206 kg)   EKG is done today and reviewed by me. There is normal  sinus rhythm with nonspecific ST changes. There are old inferior Q waves. There is no change from the past.  ASSESSMENT & PLAN

## 2014-01-13 ENCOUNTER — Encounter: Payer: Self-pay | Admitting: Endocrinology

## 2014-01-13 ENCOUNTER — Ambulatory Visit (INDEPENDENT_AMBULATORY_CARE_PROVIDER_SITE_OTHER): Payer: Medicare HMO | Admitting: Endocrinology

## 2014-01-13 VITALS — BP 122/78 | HR 68 | Temp 98.2°F | Wt 164.0 lb

## 2014-01-13 DIAGNOSIS — Z23 Encounter for immunization: Secondary | ICD-10-CM

## 2014-01-13 DIAGNOSIS — E0590 Thyrotoxicosis, unspecified without thyrotoxic crisis or storm: Secondary | ICD-10-CM

## 2014-01-13 LAB — TSH: TSH: 2.38 u[IU]/mL (ref 0.35–4.50)

## 2014-01-13 LAB — T4, FREE: Free T4: 0.4 ng/dL — ABNORMAL LOW (ref 0.60–1.60)

## 2014-01-13 NOTE — Progress Notes (Signed)
Subjective:    Patient ID: [name], female    DOB: July 22, 1960, 53 y.o.   MRN: [id number]  HPI Pt returns for f/u of multinodular goiter (dx'ed 2014; it was found on [place], which had been done to investigate lymphadenopathy of the neck; the nodes appeared benign, but goiter was incidentally noted; bx was c/w benign follicular nodule; she was noted in early 2015 to have suppressed TSH, and she received I-131 rx in April of 2015).  pt states she feels well in general, except for fatigue. Past Medical History  Diagnosis Date  . Back pain, chronic   . Depression   . Myocardial infarction 11/20/06  . Hypertension   . Hypercholesteremia   . Coronary artery disease   . Allergy   . Arthritis   . Neuromuscular disorder   . Bronchitis     hx-no inhalers at home  . Diabetes mellitus     diet controlled  . DDD (degenerative disc disease), lumbar   . Ejection fraction     .  Marland Kitchen Hx of CABG   . Carotid bruit   . Nodule of neck   . Multiple thyroid nodules     Past Surgical History  Procedure Laterality Date  . Mandible surgery  2008  . Knee surgery  2009    lt   . Tubal ligation    . Breast surgery      rt br cystx2  . Abdominal hysterectomy  1984  . Coronary artery bypass graft  2008  . Orif ankle fracture Right 07/12/2012    Procedure: OPEN REDUCTION INTERNAL FIXATION (ORIF) RIGHT ANKLE FRACTURE;  Surgeon: Alta Corning, MD;  Location: [facility];  Service: Orthopedics;  Laterality: Right;    History   Social History  . Marital Status: Married    Spouse Name: N/A    Number of Children: N/A  . Years of Education: N/A   Occupational History  . Not on file.   Social History Main Topics  . Smoking status: Current Every Day Smoker -- 0.50 packs/day for 29 years    Types: Cigarettes  . Smokeless tobacco: Never Used  . Alcohol Use: No  . Drug Use: No  . Sexual Activity: Not on file   Other Topics Concern  . Not on file   Social History  Narrative  . No narrative on file    Current Outpatient Prescriptions on File Prior to Visit  Medication Sig Dispense Refill  . aspirin 81 MG tablet Take 81 mg by mouth 2 (two) times daily.       . carvedilol (COREG) 3.125 MG tablet Take 1 tablet (3.125 mg total) by mouth 2 (two) times daily.  60 tablet  5  . DULoxetine (CYMBALTA) 60 MG capsule Take 1 capsule (60 mg total) by mouth daily.  30 capsule  5  . fish oil-omega-3 fatty acids 1000 MG capsule Take 2 g by mouth daily.        Marland Kitchen gabapentin (NEURONTIN) 400 MG capsule Take 2 capsules (800 mg total) by mouth 3 (three) times daily.  180 capsule  2  . Ipratropium-Albuterol (COMBIVENT RESPIMAT) 20-100 MCG/ACT AERS respimat Inhale 1 puff into the lungs every 6 (six) hours.  4 g  1  . oxyCODONE-acetaminophen (PERCOCET/ROXICET) 5-325 MG per tablet Take 1-2 tablets by mouth every 4 (four) hours as needed for moderate pain.  150 tablet  0  . QUEtiapine (SEROQUEL) 400 MG tablet Take 400 mg by mouth at  bedtime.      . ranitidine (ZANTAC) 150 MG tablet Take 1 tablet (150 mg total) by mouth 2 (two) times daily. PATIENT NEEDS OFFICE VISIT FOR ADDITIONAL REFILLS  60 tablet  5  . rosuvastatin (CRESTOR) 20 MG tablet Take 1 tablet (20 mg total) by mouth daily.  30 tablet  5  . traZODone (DESYREL) 50 MG tablet TAKE ONE TABLET EVERY NIGHT AT BEDTIME AS NEEDED FOR SLEEP  30 tablet  5   No current facility-administered medications on file prior to visit.    Allergies  Allergen Reactions  . Ibuprofen Swelling and Rash  . Prednisone Swelling and Rash  . Morphine Nausea And Vomiting    REACTION: Nausea  . Coconut Oil Swelling and Rash    Family History  Problem Relation Age of Onset  . Heart disease Mother   . Hyperlipidemia Mother   . Hypertension Mother   . Heart disease Father   . Stroke Father   . Hypertension Father   . Hyperlipidemia Father   . Hypertension Sister   . Hypertension Brother   . Cancer Daughter     unknown  . Hypertension  Maternal Grandmother   . Hypertension Maternal Grandfather   . Cancer Maternal Grandfather   . Hypertension Paternal Grandmother   . Hypertension Paternal Grandfather   . Cancer Maternal Aunt     breast and ovarian  . Cancer Paternal Aunt     breast  . Colon cancer Neg Hx     BP 122/78  Pulse 68  Temp(Src) 98.2 F (36.8 C) (Oral)  Wt 164 lb (74.39 kg)  SpO2 92%    Review of Systems She has weight gain    Objective:   Physical Exam VITAL SIGNS:  See vs page GENERAL: no distress Neck: small multinodular goiter, with dominant 2-3 cm right lobe nodule.    Lab Results  Component Value Date   TSH 2.38 01/13/2014      Assessment & Plan:  Hyperthyroidism, resolved with I-131 rx. Multinodular goiter, persistent after I-131 rx.  Patient is advised the following: Patient Instructions  blood tests are being requested for you today.  We'll contact you with results. Please come back for a follow-up appointment in January.

## 2014-01-13 NOTE — Patient Instructions (Addendum)
blood tests are being requested for you today.  We'll contact you with results. Please come back for a follow-up appointment in January.

## 2014-02-18 ENCOUNTER — Other Ambulatory Visit: Payer: Self-pay | Admitting: Family Medicine

## 2014-02-18 DIAGNOSIS — Z1231 Encounter for screening mammogram for malignant neoplasm of breast: Secondary | ICD-10-CM

## 2014-02-24 ENCOUNTER — Other Ambulatory Visit (INDEPENDENT_AMBULATORY_CARE_PROVIDER_SITE_OTHER): Payer: Commercial Managed Care - HMO

## 2014-02-24 ENCOUNTER — Other Ambulatory Visit: Payer: Self-pay | Admitting: Endocrinology

## 2014-02-24 DIAGNOSIS — Z23 Encounter for immunization: Secondary | ICD-10-CM

## 2014-02-24 DIAGNOSIS — E0590 Thyrotoxicosis, unspecified without thyrotoxic crisis or storm: Secondary | ICD-10-CM

## 2014-02-24 LAB — TSH: TSH: 5.89 u[IU]/mL — ABNORMAL HIGH (ref 0.35–4.50)

## 2014-02-24 LAB — T4, FREE: FREE T4: 0.46 ng/dL — AB (ref 0.60–1.60)

## 2014-02-24 MED ORDER — LEVOTHYROXINE SODIUM 25 MCG PO TABS: 25.0000 ug | ORAL_TABLET | Freq: Every day | ORAL | Status: DC

## 2014-02-25 ENCOUNTER — Ambulatory Visit (HOSPITAL_COMMUNITY): Payer: Medicaid Other

## 2014-02-27 ENCOUNTER — Ambulatory Visit (HOSPITAL_COMMUNITY): Payer: Medicaid Other

## 2014-03-12 ENCOUNTER — Ambulatory Visit (HOSPITAL_COMMUNITY)
Admission: RE | Admit: 2014-03-12 | Discharge: 2014-03-12 | Disposition: A | Discharge status: Home / Self Care | Payer: Medicare HMO | Source: Ambulatory Visit | Attending: Family Medicine | Admitting: Family Medicine

## 2014-03-12 DIAGNOSIS — Z1231 Encounter for screening mammogram for malignant neoplasm of breast: Secondary | ICD-10-CM

## 2014-03-13 ENCOUNTER — Ambulatory Visit (INDEPENDENT_AMBULATORY_CARE_PROVIDER_SITE_OTHER): Payer: Commercial Managed Care - HMO | Admitting: Family Medicine

## 2014-03-13 ENCOUNTER — Encounter: Payer: Self-pay | Admitting: Family Medicine

## 2014-03-13 ENCOUNTER — Ambulatory Visit (INDEPENDENT_AMBULATORY_CARE_PROVIDER_SITE_OTHER): Payer: Commercial Managed Care - HMO

## 2014-03-13 VITALS — BP 122/78 | HR 73 | Temp 98.6°F | Resp 16 | Ht 62.5 in | Wt 162.6 lb

## 2014-03-13 DIAGNOSIS — R7303 Prediabetes: Secondary | ICD-10-CM

## 2014-03-13 DIAGNOSIS — E7800 Pure hypercholesterolemia, unspecified: Secondary | ICD-10-CM

## 2014-03-13 DIAGNOSIS — M533 Sacrococcygeal disorders, not elsewhere classified: Secondary | ICD-10-CM

## 2014-03-13 DIAGNOSIS — F17200 Nicotine dependence, unspecified, uncomplicated: Secondary | ICD-10-CM

## 2014-03-13 DIAGNOSIS — J209 Acute bronchitis, unspecified: Secondary | ICD-10-CM

## 2014-03-13 DIAGNOSIS — K5900 Constipation, unspecified: Secondary | ICD-10-CM

## 2014-03-13 DIAGNOSIS — Z1272 Encounter for screening for malignant neoplasm of vagina: Secondary | ICD-10-CM

## 2014-03-13 DIAGNOSIS — Z23 Encounter for immunization: Secondary | ICD-10-CM

## 2014-03-13 DIAGNOSIS — Z720 Tobacco use: Secondary | ICD-10-CM

## 2014-03-13 DIAGNOSIS — Z1321 Encounter for screening for nutritional disorder: Secondary | ICD-10-CM

## 2014-03-13 DIAGNOSIS — Z79899 Other long term (current) drug therapy: Secondary | ICD-10-CM

## 2014-03-13 DIAGNOSIS — R7309 Other abnormal glucose: Secondary | ICD-10-CM

## 2014-03-13 LAB — CBC WITH DIFFERENTIAL/PLATELET
BASOS ABS: 0 10*3/uL (ref 0.0–0.1)
Basophils Relative: 0 % (ref 0–1)
Eosinophils Absolute: 0.2 10*3/uL (ref 0.0–0.7)
Eosinophils Relative: 2 % (ref 0–5)
HEMATOCRIT: 48.3 % — AB (ref 36.0–46.0)
Hemoglobin: 16.4 g/dL — ABNORMAL HIGH (ref 12.0–15.0)
LYMPHS PCT: 37 % (ref 12–46)
Lymphs Abs: 3.6 10*3/uL (ref 0.7–4.0)
MCH: 30.9 pg (ref 26.0–34.0)
MCHC: 34 g/dL (ref 30.0–36.0)
MCV: 91 fL (ref 78.0–100.0)
Monocytes Absolute: 0.6 10*3/uL (ref 0.1–1.0)
Monocytes Relative: 6 % (ref 3–12)
NEUTROS ABS: 5.4 10*3/uL (ref 1.7–7.7)
NEUTROS PCT: 55 % (ref 43–77)
Platelets: 179 10*3/uL (ref 150–400)
RBC: 5.31 MIL/uL — ABNORMAL HIGH (ref 3.87–5.11)
RDW: 14.6 % (ref 11.5–15.5)
WBC: 9.8 10*3/uL (ref 4.0–10.5)

## 2014-03-13 LAB — COMPLETE METABOLIC PANEL WITH GFR
ALBUMIN: 4.3 g/dL (ref 3.5–5.2)
ALK PHOS: 103 U/L (ref 39–117)
ALT: 8 U/L (ref 0–35)
AST: 18 U/L (ref 0–37)
BUN: 10 mg/dL (ref 6–23)
CO2: 24 mEq/L (ref 19–32)
Calcium: 9.7 mg/dL (ref 8.4–10.5)
Chloride: 108 mEq/L (ref 96–112)
Creat: 0.86 mg/dL (ref 0.50–1.10)
GFR, EST NON AFRICAN AMERICAN: 78 mL/min
GFR, Est African American: >89 mL/min
Glucose, Bld: 128 mg/dL — ABNORMAL HIGH (ref 70–99)
Potassium: 4.1 mEq/L (ref 3.5–5.3)
Sodium: 141 mEq/L (ref 135–145)
Total Bilirubin: 0.6 mg/dL (ref 0.2–1.2)
Total Protein: 7 g/dL (ref 6.0–8.3)

## 2014-03-13 LAB — LIPID PANEL
CHOL/HDL RATIO: 3.8 ratio
Cholesterol: 129 mg/dL (ref 0–200)
HDL: 34 mg/dL — AB (ref >39)
LDL Cholesterol: 61 mg/dL (ref 0–99)
Triglycerides: 171 mg/dL — ABNORMAL HIGH (ref <150)
VLDL: 34 mg/dL (ref 0–40)

## 2014-03-13 MED ORDER — OXYCODONE-ACETAMINOPHEN 5-325 MG PO TABS: 1.0000 | ORAL_TABLET | ORAL | Status: DC | PRN

## 2014-03-13 MED ORDER — OXYCODONE-ACETAMINOPHEN 5-325 MG PO TABS: 1.0000 | ORAL_TABLET | Freq: Three times a day (TID) | ORAL | Status: DC | PRN

## 2014-03-13 MED ORDER — AZITHROMYCIN 250 MG PO TABS: ORAL_TABLET | ORAL | Status: DC

## 2014-03-13 NOTE — Progress Notes (Addendum)
Subjective:    Patient ID: [name], female    DOB: [birth year], 53 y.o.   MRN: [id number]   This chart was scribed for Shawnee Knapp, MD by Hilda Lias, ED Scribe. the patient's care was started at 2:50 PM.  Chief Complaint  Patient presents with  . Gynecologic Exam    PAP SMEAR     Gynecologic Exam Associated symptoms include back pain, constipation and rash. Pertinent negatives include no abdominal pain, chills, diarrhea, fever, frequency, nausea, urgency or vomiting.    Past Medical History  Diagnosis Date  . Back pain, chronic   . Depression   . Myocardial infarction 11/20/06  . Hypertension   . Hypercholesteremia   . Coronary artery disease   . Allergy   . Arthritis   . Neuromuscular disorder   . Bronchitis     hx-no inhalers at home  . Diabetes mellitus     diet controlled  . DDD (degenerative disc disease), lumbar   . Ejection fraction     .  Marland Kitchen Hx of CABG   . Carotid bruit   . Nodule of neck   . Multiple thyroid nodules    Current Outpatient Prescriptions on File Prior to Visit  Medication Sig Dispense Refill  . aspirin 81 MG tablet Take 81 mg by mouth 2 (two) times daily.     . carvedilol (COREG) 3.125 MG tablet Take 1 tablet (3.125 mg total) by mouth 2 (two) times daily. 60 tablet 5  . DULoxetine (CYMBALTA) 60 MG capsule Take 1 capsule (60 mg total) by mouth daily. 30 capsule 5  . fish oil-omega-3 fatty acids 1000 MG capsule Take 2 g by mouth daily.      Marland Kitchen gabapentin (NEURONTIN) 400 MG capsule Take 2 capsules (800 mg total) by mouth 3 (three) times daily. 180 capsule 2  . levothyroxine (SYNTHROID, LEVOTHROID) 25 MCG tablet Take 1 tablet (25 mcg total) by mouth daily before breakfast. 30 tablet 2  . oxyCODONE-acetaminophen (PERCOCET/ROXICET) 5-325 MG per tablet Take 1-2 tablets by mouth every 4 (four) hours as needed for moderate pain. 150 tablet 0  . QUEtiapine (SEROQUEL) 400 MG tablet Take 400 mg by mouth at bedtime.    . ranitidine  (ZANTAC) 150 MG tablet Take 1 tablet (150 mg total) by mouth 2 (two) times daily. PATIENT NEEDS OFFICE VISIT FOR ADDITIONAL REFILLS 60 tablet 5  . rosuvastatin (CRESTOR) 20 MG tablet Take 1 tablet (20 mg total) by mouth daily. 30 tablet 5  . Ipratropium-Albuterol (COMBIVENT RESPIMAT) 20-100 MCG/ACT AERS respimat Inhale 1 puff into the lungs every 6 (six) hours. 4 g 1  . traZODone (DESYREL) 50 MG tablet TAKE ONE TABLET EVERY NIGHT AT BEDTIME AS NEEDED FOR SLEEP 30 tablet 5   No current facility-administered medications on file prior to visit.   Allergies  Allergen Reactions  . Ibuprofen Swelling and Rash  . Prednisone Swelling and Rash  . Morphine Nausea And Vomiting    REACTION: Nausea  . Coconut Oil Swelling and Rash      HPI Comments: [name] is a 53 y.o. female who presents to [facility] for a follow-up.   Pt has been seeing Dr. Loanne Drilling for hyperthyroid issues, which is currently treated with radioiodine ablation. Pt saw Dr. Loanne Drilling two months ago. Her TSH has remained stable after radiation treatment until two weeks ago when it increased to 5.8 when she was started on levothyroxine 25 daily. Pt states that  she has been taking medication for TSH regularly.   She saw her cardiologist two months ago, who made no changes to her medications for her coronary artery disease. Pt last had routine blood work 7 months prior. Pt had hysterectomy in 1984. No prior pap smears seen in the chart. Pt had colonoscopy in April of this year. No prior complete physical seen. Pt had her mammogram done yesterday. Pt had bone density test in 2011 that was normal.   Pt states that she has also been taking Wellbutrin regularly with sig improvement. Pt states that she took Klonopin for the first time last night, and notes that it was very strong but effective. She stated that she may need to have the dosage reduced the next time she is prescribed the medication and will try  cutting it in half.  Doing better now that her psychiatrist Dr. Adele Schilder decreased qhs seroquel from 400 to 300 mg qhs.    Pt also notes that she recently fell and has pain in her tailbone area. Pt notes that the pain worsens with increased time of sitting. Pt states that she feels like she needs an X-ray, and has not tried any remedies to counter the pain at home. Pt denies taking Vitamin D or Calcium supplements  Review of Systems  Constitutional: Positive for fatigue. Negative for fever and chills.  Gastrointestinal: Positive for constipation. Negative for nausea, vomiting, abdominal pain and diarrhea.  Genitourinary: Negative for urgency, frequency, decreased urine volume and difficulty urinating.  Musculoskeletal: Positive for myalgias, back pain, joint swelling, arthralgias and gait problem.  Skin: Positive for color change and rash. Negative for pallor and wound.  Neurological: Positive for weakness. Negative for numbness.  Hematological: Negative for adenopathy. Bruises/bleeds easily.       Objective:  BP 122/78 mmHg  Pulse 73  Temp(Src) 98.6 F (37 C) (Oral)  Resp 16  Ht 5' 2.5" (1.588 m)  Wt 162 lb 9.6 oz (73.755 kg)  BMI 29.25 kg/m2  SpO2 93%   Physical Exam  Constitutional: She is oriented to person, place, and time. She appears well-developed and well-nourished. No distress.  HENT:  Head: Normocephalic and atraumatic.  Right Ear: External ear normal.  Eyes: Conjunctivae are normal. No scleral icterus.  Cardiovascular: Normal rate.   Pulmonary/Chest: Effort normal.  Abdominal: She exhibits no distension.  Genitourinary: Vagina normal. There is no rash, tenderness or lesion on the right labia. There is no rash, tenderness or lesion on the left labia. Right adnexum displays no mass, no tenderness and no fullness. Left adnexum displays no mass, no tenderness and no fullness. No tenderness or bleeding in the vagina. No signs of injury around the vagina. No vaginal discharge  found.  Uterus and cervix surgically absent  Neurological: She is alert and oriented to person, place, and time.  Skin: Skin is warm and dry. She is not diaphoretic. No erythema.  Psychiatric: She has a normal mood and affect. Her behavior is normal.  Nursing note and vitals reviewed.  [facility] reading (PRIMARY) by  Dr. Brigitte Pulse. Sacral xray: constipation - cannot exclude prior fracture but no acute injury     Assessment & Plan:   Sacral back pain - Plan: DG Sacrum/Coccyx, DG Bone Density - needs DEXA early due to multiple fractures and smoking. Refilled chronic narcotics - f/u in OV for additional refills in 4 mos.  Acute bronchitis, unspecified organism - zpack - if sxs cont, rec RTC for CXR - rec PFTs and CXR if cough  persists due to tob use.  Tobacco use disorder - Plan: DG Bone Density - needs pneumovax today. Encourage cessation esp as hgb elevated.  Would be great candidate from low dose lung CT for lung ca screening when this is available through [facility].  Screening for vaginal cancer - Plan: Pap IG and HPV (high risk) DNA detection - pt really would like pap smear today as has been many years since any pelvic exam - had hysterectomy inc cervix (but no BSO) for benign menorrhagia so no further paps needed if nml.  Hypercholesteremia - Plan: Lipid panel  Encounter for vitamin deficiency screening - Plan: Vit D  25 hydroxy (rtn osteoporosis monitoring), DG Bone Density  Polypharmacy - Plan: COMPLETE METABOLIC PANEL WITH GFR, CBC with Differential - doing better now that followed by psych and klonopin and wellbutrin started with seroquel decrease.  Need for prophylactic vaccination against Streptococcus pneumoniae (pneumococcus) - Plan: Pneumococcal polysaccharide vaccine 23-valent greater than or equal to 2yo subcutaneous/IM  Constipation, unspecified constipation type  Prediabetes - check hgba1c at f/u  Meds ordered this encounter  Medications  . buPROPion (WELLBUTRIN SR) 150 MG 12 hr  tablet    Sig: Take 150 mg by mouth daily.  . QUEtiapine (SEROQUEL) 300 MG tablet    Sig: Take 300 mg by mouth at bedtime.  Marland Kitchen oxyCODONE-acetaminophen (PERCOCET/ROXICET) 5-325 MG per tablet    Sig: Take 1-2 tablets by mouth every 4 (four) hours as needed for moderate pain.    Dispense:  150 tablet    Refill:  0    May fill on or after 30d after date written  . oxyCODONE-acetaminophen (ROXICET) 5-325 MG per tablet    Sig: Take 1-2 tablets by mouth every 4 (four) hours as needed for severe pain.    Dispense:  150 tablet    Refill:  0  . oxyCODONE-acetaminophen (ROXICET) 5-325 MG per tablet    Sig: Take 1-2 tablets by mouth every 8 (eight) hours as needed for severe pain. May fill 60 d after date written    Dispense:  150 tablet    Refill:  0  . oxyCODONE-acetaminophen (ROXICET) 5-325 MG per tablet    Sig: Take 1-2 tablets by mouth every 4 (four) hours as needed for severe pain. May fill 90d after date written    Dispense:  150 tablet    Refill:  0  . azithromycin (ZITHROMAX) 250 MG tablet    Sig: Take 2 tabs PO x 1 dose, then 1 tab PO QD x 4 days    Dispense:  6 tablet    Refill:  0    I personally performed the services described in this documentation, which was scribed in my presence. The recorded information has been reviewed and considered, and addended by me as needed.  [name], MD MPH

## 2014-03-14 LAB — VITAMIN D 25 HYDROXY (VIT D DEFICIENCY, FRACTURES): VIT D 25 HYDROXY: 35 ng/mL (ref 30–89)

## 2014-03-17 LAB — PAP IG AND HPV HIGH-RISK: HPV DNA HIGH RISK: NOT DETECTED

## 2014-03-23 ENCOUNTER — Encounter: Payer: Self-pay | Admitting: Family Medicine

## 2014-03-23 ENCOUNTER — Telehealth: Payer: Self-pay

## 2014-03-23 NOTE — Telephone Encounter (Signed)
Pt dropped off ins. Form to be completed from her previous pe   Best phone for pt is [phone]

## 2014-03-23 NOTE — Telephone Encounter (Signed)
Form put in Dr. Raul Del box after filling out biometric information.

## 2014-03-23 NOTE — Telephone Encounter (Signed)
Form completed and put on team leader/Hansen's desk. Pt needs to fill out her waist in inches form.

## 2014-03-24 NOTE — Telephone Encounter (Signed)
Form completed- in pick up drawer. Pt notified.

## 2014-04-28 ENCOUNTER — Other Ambulatory Visit: Payer: Self-pay | Admitting: *Deleted

## 2014-04-28 ENCOUNTER — Other Ambulatory Visit (INDEPENDENT_AMBULATORY_CARE_PROVIDER_SITE_OTHER): Payer: Commercial Managed Care - HMO

## 2014-04-28 DIAGNOSIS — E0590 Thyrotoxicosis, unspecified without thyrotoxic crisis or storm: Secondary | ICD-10-CM

## 2014-04-28 LAB — TSH: TSH: 17.23 u[IU]/mL — AB (ref 0.35–4.50)

## 2014-04-28 LAB — T4, FREE: Free T4: 0.47 ng/dL — ABNORMAL LOW (ref 0.60–1.60)

## 2014-04-29 ENCOUNTER — Other Ambulatory Visit: Payer: Self-pay | Admitting: Endocrinology

## 2014-04-29 MED ORDER — LEVOTHYROXINE SODIUM 50 MCG PO TABS: 50.0000 ug | ORAL_TABLET | Freq: Every day | ORAL | Status: DC

## 2014-05-12 ENCOUNTER — Telehealth: Payer: Self-pay | Admitting: *Deleted

## 2014-05-12 NOTE — Telephone Encounter (Signed)
Phoned [facility] (opthalmology referral) and spoke with Jon Gills, who stated patient had not been seen since 2013.

## 2014-06-02 ENCOUNTER — Ambulatory Visit: Payer: Commercial Managed Care - HMO | Admitting: Endocrinology

## 2014-06-02 ENCOUNTER — Other Ambulatory Visit: Payer: Self-pay | Admitting: Family Medicine

## 2014-06-03 ENCOUNTER — Telehealth: Payer: Self-pay | Admitting: Endocrinology

## 2014-06-03 ENCOUNTER — Encounter: Payer: Self-pay | Admitting: Endocrinology

## 2014-06-03 ENCOUNTER — Ambulatory Visit (INDEPENDENT_AMBULATORY_CARE_PROVIDER_SITE_OTHER): Payer: Commercial Managed Care - HMO | Admitting: Endocrinology

## 2014-06-03 VITALS — BP 124/70 | HR 65 | Temp 98.7°F | Ht 62.0 in | Wt 170.0 lb

## 2014-06-03 DIAGNOSIS — E890 Postprocedural hypothyroidism: Secondary | ICD-10-CM

## 2014-06-03 LAB — TSH: TSH: 10.35 u[IU]/mL — AB (ref 0.35–4.50)

## 2014-06-03 MED ORDER — LEVOTHYROXINE SODIUM 75 MCG PO TABS: 75.0000 ug | ORAL_TABLET | Freq: Every day | ORAL | Status: DC

## 2014-06-03 NOTE — Progress Notes (Signed)
Subjective:    Patient ID: [name], female    DOB: Jul 13, 1960, 54 y.o.   MRN: [id number]  HPI Pt returns for f/u of multinodular goiter (dx'ed 2014; it was found on [place], which had been done to investigate lymphadenopathy of the neck; the nodes appeared benign, but goiter was incidentally noted; bx was c/w benign follicular nodule; she was noted in early 2015 to have suppressed TSH, and she received I-131 rx in April of 2015; in oct of 2015, she was noted to have elevated TSH, and was rx'ed synthroid).  since on the increased synthroid, pt states she feels no different, and well in general.   Past Medical History  Diagnosis Date  . Back pain, chronic   . Depression   . Myocardial infarction 11/20/06  . Hypertension   . Hypercholesteremia   . Coronary artery disease   . Allergy   . Arthritis   . Neuromuscular disorder   . Bronchitis     hx-no inhalers at home  . Diabetes mellitus     diet controlled  . DDD (degenerative disc disease), lumbar   . Ejection fraction     .  Marland Kitchen Hx of CABG   . Carotid bruit   . Nodule of neck   . Multiple thyroid nodules     Past Surgical History  Procedure Laterality Date  . Mandible surgery  2008  . Knee surgery  2009    lt   . Tubal ligation    . Breast surgery      rt br cystx2  . Abdominal hysterectomy  1984  . Coronary artery bypass graft  2008  . Orif ankle fracture Right 07/12/2012    Procedure: OPEN REDUCTION INTERNAL FIXATION (ORIF) RIGHT ANKLE FRACTURE;  Surgeon: Alta Corning, MD;  Location: [facility];  Service: Orthopedics;  Laterality: Right;    History   Social History  . Marital Status: Married    Spouse Name: N/A    Number of Children: N/A  . Years of Education: N/A   Occupational History  . Not on file.   Social History Main Topics  . Smoking status: Current Every Day Smoker -- 0.50 packs/day for 29 years    Types: Cigarettes  . Smokeless tobacco: Never Used  . Alcohol Use: No  .  Drug Use: No  . Sexual Activity: Not on file   Other Topics Concern  . Not on file   Social History Narrative    Current Outpatient Prescriptions on File Prior to Visit  Medication Sig Dispense Refill  . aspirin 81 MG tablet Take 81 mg by mouth 2 (two) times daily.     Marland Kitchen azithromycin (ZITHROMAX) 250 MG tablet Take 2 tabs PO x 1 dose, then 1 tab PO QD x 4 days 6 tablet 0  . buPROPion (WELLBUTRIN SR) 150 MG 12 hr tablet Take 150 mg by mouth daily.    . carvedilol (COREG) 3.125 MG tablet Take 1 tablet (3.125 mg total) by mouth 2 (two) times daily. 60 tablet 5  . DULoxetine (CYMBALTA) 60 MG capsule Take 1 capsule (60 mg total) by mouth daily. 30 capsule 5  . fish oil-omega-3 fatty acids 1000 MG capsule Take 2 g by mouth daily.      Marland Kitchen gabapentin (NEURONTIN) 400 MG capsule TAKE 2 CAPSULES (800 MG TOTAL) BY MOUTH 3 (THREE) TIMES DAILY. 180 capsule 2  . Ipratropium-Albuterol (COMBIVENT RESPIMAT) 20-100 MCG/ACT AERS respimat Inhale 1 puff into the lungs  every 6 (six) hours. 4 g 1  . oxyCODONE-acetaminophen (PERCOCET/ROXICET) 5-325 MG per tablet Take 1-2 tablets by mouth every 4 (four) hours as needed for moderate pain. 150 tablet 0  . oxyCODONE-acetaminophen (ROXICET) 5-325 MG per tablet Take 1-2 tablets by mouth every 4 (four) hours as needed for severe pain. 150 tablet 0  . oxyCODONE-acetaminophen (ROXICET) 5-325 MG per tablet Take 1-2 tablets by mouth every 8 (eight) hours as needed for severe pain. May fill 60 d after date written 150 tablet 0  . oxyCODONE-acetaminophen (ROXICET) 5-325 MG per tablet Take 1-2 tablets by mouth every 4 (four) hours as needed for severe pain. May fill 90d after date written 150 tablet 0  . QUEtiapine (SEROQUEL) 300 MG tablet Take 300 mg by mouth at bedtime.    . ranitidine (ZANTAC) 150 MG tablet Take 1 tablet (150 mg total) by mouth 2 (two) times daily. PATIENT NEEDS OFFICE VISIT FOR ADDITIONAL REFILLS 60 tablet 5  . rosuvastatin (CRESTOR) 20 MG tablet Take 1 tablet  (20 mg total) by mouth daily. 30 tablet 5   No current facility-administered medications on file prior to visit.    Allergies  Allergen Reactions  . Ibuprofen Swelling and Rash  . Prednisone Swelling and Rash  . Morphine Nausea And Vomiting    REACTION: Nausea  . Coconut Oil Swelling and Rash    Family History  Problem Relation Age of Onset  . Heart disease Mother   . Hyperlipidemia Mother   . Hypertension Mother   . Heart disease Father   . Stroke Father   . Hypertension Father   . Hyperlipidemia Father   . Hypertension Sister   . Hypertension Brother   . Cancer Daughter     unknown  . Hypertension Maternal Grandmother   . Hypertension Maternal Grandfather   . Cancer Maternal Grandfather   . Hypertension Paternal Grandmother   . Hypertension Paternal Grandfather   . Cancer Maternal Aunt     breast and ovarian  . Cancer Paternal Aunt     breast  . Colon cancer Neg Hx     BP 124/70 mmHg  Pulse 65  Temp(Src) 98.7 F (37.1 C) (Oral)  Ht 5\' 2"  (1.575 m)  Wt 170 lb (77.111 kg)  BMI 31.09 kg/m2  SpO2 91%  Review of Systems She has gained weight    Objective:   Physical Exam VITAL SIGNS:  See vs page.   GENERAL: no distress. Neck: small multinodular goiter, with dominant 2-3 cm right lobe nodule.    Lab Results  Component Value Date   TSH 10.35* 06/03/2014      Assessment & Plan:  Post-I-131 hypothyroidism: she needs increased rx  Patient is advised the following: Patient Instructions  blood tests are being requested for you today.  We'll contact you with results. Please come back for a follow-up appointment in 3 months.

## 2014-06-03 NOTE — Patient Instructions (Addendum)
blood tests are being requested for you today.  We'll contact you with results.   Please come back for a follow-up appointment in 3 months.    

## 2014-07-17 ENCOUNTER — Encounter: Payer: Self-pay | Admitting: Family Medicine

## 2014-07-17 ENCOUNTER — Ambulatory Visit (INDEPENDENT_AMBULATORY_CARE_PROVIDER_SITE_OTHER): Payer: Medicare PPO | Admitting: Family Medicine

## 2014-07-17 VITALS — BP 111/72 | HR 76 | Temp 98.1°F | Resp 16 | Ht 62.0 in | Wt 162.0 lb

## 2014-07-17 DIAGNOSIS — Z79899 Other long term (current) drug therapy: Secondary | ICD-10-CM

## 2014-07-17 DIAGNOSIS — R7303 Prediabetes: Secondary | ICD-10-CM

## 2014-07-17 DIAGNOSIS — M5430 Sciatica, unspecified side: Secondary | ICD-10-CM | POA: Diagnosis not present

## 2014-07-17 DIAGNOSIS — R7309 Other abnormal glucose: Secondary | ICD-10-CM | POA: Diagnosis not present

## 2014-07-17 DIAGNOSIS — D751 Secondary polycythemia: Secondary | ICD-10-CM | POA: Diagnosis not present

## 2014-07-17 LAB — CBC WITH DIFFERENTIAL/PLATELET
Basophils Absolute: 0 10*3/uL (ref 0.0–0.1)
Basophils Relative: 0 % (ref 0–1)
EOS ABS: 0.2 10*3/uL (ref 0.0–0.7)
Eosinophils Relative: 2 % (ref 0–5)
HCT: 48.7 % — ABNORMAL HIGH (ref 36.0–46.0)
Hemoglobin: 16.5 g/dL — ABNORMAL HIGH (ref 12.0–15.0)
Lymphocytes Relative: 37 % (ref 12–46)
Lymphs Abs: 3.4 10*3/uL (ref 0.7–4.0)
MCH: 30.2 pg (ref 26.0–34.0)
MCHC: 33.9 g/dL (ref 30.0–36.0)
MCV: 89 fL (ref 78.0–100.0)
MONO ABS: 0.6 10*3/uL (ref 0.1–1.0)
MPV: 10.4 fL (ref 8.6–12.4)
Monocytes Relative: 6 % (ref 3–12)
Neutro Abs: 5.1 10*3/uL (ref 1.7–7.7)
Neutrophils Relative %: 55 % (ref 43–77)
Platelets: 219 10*3/uL (ref 150–400)
RBC: 5.47 MIL/uL — ABNORMAL HIGH (ref 3.87–5.11)
RDW: 13.8 % (ref 11.5–15.5)
WBC: 9.2 10*3/uL (ref 4.0–10.5)

## 2014-07-17 LAB — POCT GLYCOSYLATED HEMOGLOBIN (HGB A1C): Hemoglobin A1C: 6.6

## 2014-07-17 MED ORDER — OXYCODONE-ACETAMINOPHEN 5-325 MG PO TABS: 1.0000 | ORAL_TABLET | ORAL | Status: DC | PRN

## 2014-07-17 MED ORDER — OXYCODONE-ACETAMINOPHEN 5-325 MG PO TABS: 1.0000 | ORAL_TABLET | Freq: Three times a day (TID) | ORAL | Status: DC | PRN

## 2014-07-17 MED ORDER — KETOROLAC TROMETHAMINE 60 MG/2ML IM SOLN
60.0000 mg | Freq: Once | INTRAMUSCULAR | Status: AC
  Administered 2014-07-17: 60 mg via INTRAMUSCULAR

## 2014-07-17 MED ORDER — DIAZEPAM 5 MG PO TABS: 5.0000 mg | ORAL_TABLET | Freq: Two times a day (BID) | ORAL | Status: DC | PRN

## 2014-07-17 MED ORDER — METHYLPREDNISOLONE ACETATE 80 MG/ML IJ SUSP
80.0000 mg | Freq: Once | INTRAMUSCULAR | Status: AC
  Administered 2014-07-17: 80 mg via INTRAMUSCULAR

## 2014-07-17 NOTE — Progress Notes (Signed)
Subjective:    Patient ID: [name], female    DOB: 1961-01-05, 54 y.o.   MRN: [id number] This chart was scribed for [name] Cheadle, MD by Zola Button, Medical Scribe. This patient was seen in Room 27 and the patient's care was started at 11:22 AM.   Chief Complaint  Patient presents with  . Medication Refill  . Follow-up  . Hyperglycemia    HPI HPI Comments: Melanny [name] is a 54 y.o. 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   She saw her endocrinologist last month. She had radioiodine therapy for her multinodular goiter last year and has been stable on synthroid, which was just increased from 50 mcg to 75 mcg daily. Patient had complete lab evalualtion in her last visit last November. She has pre-diabetes and was advised to increase exercise and stay on a low-carbohydrate diet.  Hypothyroidism: Patient has lost 8 pounds; she believes this is attributed to the change of thyroid medication.  Psychiatric: She states she has been doing well on Wellbutrin, but with occasional irritability. She has been able to cut down smoking significantly to 3 packs per week. Patient currently sees a psychiatrist at [facility] (she does not remember the name), but plans to switch because he only works once a week and is old and forgetful per patient. She reports having some difficulty sleeping since she was put on Klonopin and her Seroquel dose was decreased. She has an appointment with him in 3 days. 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  Back Pain: Patient notes having right lower back pain radiating to the anterior part of her right thigh that started 1 month ago. She attributes this pain to working because she  spends the majority of her time sitting at work. The pain is worsened when bearing weight on her right leg. She wants to quit the job and wants a note for this. Patient reports having associated right leg numbness. She denies any recent falls or injuries. She also reports having hemorrhoids since starting the job and tailbone pain; she plans to see an orthopedic specialist for this. Patient cannot tolerate flexeril.   Constipation: Patient has had chronic constipation. For this, she has been taking Miralax and colace with relief; she has been able to have 1 bowel movement a day.  Vitamins/Supplements: Patient is taking calcium and vitamin D qd.  Patient states her husband and family are doing well.   Past Medical History  Diagnosis Date  . Back pain, chronic   . Depression   . Myocardial infarction 11/20/06  . Hypertension   . Hypercholesteremia   . Coronary artery disease   . Allergy   . Arthritis   . Neuromuscular disorder   . Bronchitis     hx-no inhalers at home  . Diabetes mellitus     diet controlled  . DDD (degenerative disc disease), lumbar   . Ejection fraction     .  Marland Kitchen Hx of CABG   . Carotid bruit   . Nodule of neck   . Multiple thyroid nodules    Current Outpatient Prescriptions on File Prior to Visit  Medication Sig Dispense Refill  . aspirin 81 MG tablet Take 81 mg by mouth 2 (two) times daily.     Marland Kitchen  buPROPion (WELLBUTRIN SR) 150 MG 12 hr tablet Take 150 mg by mouth daily.    . carvedilol (COREG) 3.125 MG tablet Take 1 tablet (3.125 mg total) by mouth 2 (two) times daily. 60 tablet 5  . DULoxetine (CYMBALTA) 60 MG capsule Take 1 capsule (60 mg total) by mouth daily. 30 capsule 5  . fish oil-omega-3 fatty acids 1000 MG capsule Take 2 g by mouth daily.      Marland Kitchen gabapentin (NEURONTIN) 400 MG capsule TAKE 2 CAPSULES (800 MG TOTAL) BY MOUTH 3 (THREE) TIMES DAILY. 180 capsule 2  . Ipratropium-Albuterol (COMBIVENT RESPIMAT) 20-100 MCG/ACT AERS respimat Inhale 1 puff into  the lungs every 6 (six) hours. 4 g 1  . levothyroxine (SYNTHROID, LEVOTHROID) 75 MCG tablet Take 1 tablet (75 mcg total) by mouth daily before breakfast. 30 tablet 5  . QUEtiapine (SEROQUEL) 300 MG tablet Take 200 mg by mouth at bedtime.     . rosuvastatin (CRESTOR) 20 MG tablet Take 1 tablet (20 mg total) by mouth daily. 30 tablet 5   No current facility-administered medications on file prior to visit.   Allergies  Allergen Reactions  . Ibuprofen Swelling and Rash  . Prednisone Swelling and Rash  . Coconut Flavor   . Cyclobenzaprine   . Morphine Nausea And Vomiting    REACTION: Nausea  . Coconut Oil Swelling and Rash    Review of Systems  Constitutional: Negative for fever and chills.  Gastrointestinal: Negative for nausea, vomiting, abdominal pain, diarrhea and constipation.  Genitourinary: Negative for urgency, frequency, decreased urine volume and difficulty urinating.  Musculoskeletal: Positive for myalgias and back pain. Negative for joint swelling, arthralgias and gait problem.  Neurological: Positive for weakness. Negative for numbness.  Hematological: Negative for adenopathy. Does not bruise/bleed easily.  Psychiatric/Behavioral: Positive for sleep disturbance.       Objective:  BP 111/72 mmHg  Pulse 76  Temp(Src) 98.1 F (36.7 C)  Resp 16  Ht 5' 2"  (1.575 m)  Wt 162 lb (73.483 kg)  BMI 29.62 kg/m2  SpO2 92%  Physical Exam  Constitutional: She is oriented to person, place, and time. She appears well-developed and well-nourished. No distress.  HENT:  Head: Normocephalic and atraumatic.  Mouth/Throat: Oropharynx is clear and moist. No oropharyngeal exudate.  Eyes: Pupils are equal, round, and reactive to light.  Neck: Neck supple. No thyromegaly present.  Cardiovascular: Normal rate, regular rhythm, S1 normal, S2 normal and normal heart sounds.   No murmur heard. Pulmonary/Chest: Effort normal and breath sounds normal. No respiratory distress. She has no  wheezes. She has no rales.  CTAB. Good air movement.  Musculoskeletal: She exhibits no edema.  Low lumbar: sacral spinal muscles bilaterally with spasms and point tenderness. Positive straight leg raise bilaterally. Dorsiflexion 5/5 on the left, 4/5 on the right. Plantarflexion 5/5 on the left, 5/5 on the right. Hip flexors, quads and hamstings 5/5 on the left, 4/5 on the right.  Lymphadenopathy:    She has no cervical adenopathy.  Neurological: She is alert and oriented to person, place, and time. No cranial nerve deficit.  Reflex Scores:      Patellar reflexes are 2+ on the left side. 2+ patellar reflexes on the left, trace on the right. Antalgic gait.  Skin: Skin is warm and dry. No rash noted.  Psychiatric: She has a normal mood and affect. Her behavior is normal.  Nursing note and vitals reviewed.         Assessment & Plan:   Prediabetes -  Plan: POCT glycosylated hemoglobin (Hb A1C), Comprehensive metabolic panel  Polycythemia, secondary - Plan: CBC with Differential/Platelet  Sciatic leg pain - Plan: CBC with Differential/Platelet, methylPREDNISolone acetate (DEPO-MEDROL) injection 80 mg, ketorolac (TORADOL) injection 60 mg  Polypharmacy - Plan: Comprehensive metabolic panel  Meds ordered this encounter  Medications  . clonazePAM (KLONOPIN) 0.5 MG tablet    Sig: Take 0.5 mg by mouth 2 (two) times daily as needed for anxiety.  Marland Kitchen oxyCODONE-acetaminophen (PERCOCET/ROXICET) 5-325 MG per tablet    Sig: Take 1-2 tablets by mouth every 4 (four) hours as needed for moderate pain.    Dispense:  150 tablet    Refill:  0    May fill on or after 30d after date written  . oxyCODONE-acetaminophen (ROXICET) 5-325 MG per tablet    Sig: Take 1-2 tablets by mouth every 4 (four) hours as needed for severe pain.    Dispense:  150 tablet    Refill:  0  . oxyCODONE-acetaminophen (ROXICET) 5-325 MG per tablet    Sig: Take 1-2 tablets by mouth every 8 (eight) hours as needed for severe  pain. May fill 60 d after date written    Dispense:  150 tablet    Refill:  0  . oxyCODONE-acetaminophen (ROXICET) 5-325 MG per tablet    Sig: Take 1-2 tablets by mouth every 4 (four) hours as needed for severe pain. May fill 90d after date written    Dispense:  150 tablet    Refill:  0  . diazepam (VALIUM) 5 MG tablet    Sig: Take 1 tablet (5 mg total) by mouth every 12 (twelve) hours as needed for muscle spasms or sedation.    Dispense:  30 tablet    Refill:  1  . methylPREDNISolone acetate (DEPO-MEDROL) injection 80 mg    Sig:   . ketorolac (TORADOL) injection 60 mg    Sig:     I personally performed the services described in this documentation, which was scribed in my presence. The recorded information has been reviewed and considered, and addended by me as needed.  [name] Cheadle, MD MPH  Results for orders placed or performed in visit on 07/17/14  CBC with Differential/Platelet  Result Value Ref Range   WBC 9.2 4.0 - 10.5 K/uL   RBC 5.47 (H) 3.87 - 5.11 MIL/uL   Hemoglobin 16.5 (H) 12.0 - 15.0 g/dL   HCT 48.7 (H) 36.0 - 46.0 %   MCV 89.0 78.0 - 100.0 fL   MCH 30.2 26.0 - 34.0 pg   MCHC 33.9 30.0 - 36.0 g/dL   RDW 13.8 11.5 - 15.5 %   Platelets 219 150 - 400 K/uL   MPV 10.4 8.6 - 12.4 fL   Neutrophils Relative % 55 43 - 77 %   Neutro Abs 5.1 1.7 - 7.7 K/uL   Lymphocytes Relative 37 12 - 46 %   Lymphs Abs 3.4 0.7 - 4.0 K/uL   Monocytes Relative 6 3 - 12 %   Monocytes Absolute 0.6 0.1 - 1.0 K/uL   Eosinophils Relative 2 0 - 5 %   Eosinophils Absolute 0.2 0.0 - 0.7 K/uL   Basophils Relative 0 0 - 1 %   Basophils Absolute 0.0 0.0 - 0.1 K/uL   Smear Review Criteria for review not met   Comprehensive metabolic panel  Result Value Ref Range   Sodium 135 135 - 145 mEq/L   Potassium 4.4 3.5 - 5.3 mEq/L   Chloride 104 96 - 112 mEq/L  CO2 22 19 - 32 mEq/L   Glucose, Bld 116 (H) 70 - 99 mg/dL   BUN 13 6 - 23 mg/dL   Creat 0.83 0.50 - 1.10 mg/dL   Total Bilirubin 0.5 0.2 - 1.2  mg/dL   Alkaline Phosphatase 83 39 - 117 U/L   AST 12 0 - 37 U/L   ALT <8 0 - 35 U/L   Total Protein 6.9 6.0 - 8.3 g/dL   Albumin 4.3 3.5 - 5.2 g/dL   Calcium 9.9 8.4 - 10.5 mg/dL  POCT glycosylated hemoglobin (Hb A1C)  Result Value Ref Range   Hemoglobin A1C 6.6

## 2014-07-18 LAB — COMPREHENSIVE METABOLIC PANEL
AST: 12 U/L (ref 0–37)
Albumin: 4.3 g/dL (ref 3.5–5.2)
Alkaline Phosphatase: 83 U/L (ref 39–117)
BUN: 13 mg/dL (ref 6–23)
CHLORIDE: 104 meq/L (ref 96–112)
CO2: 22 meq/L (ref 19–32)
Calcium: 9.9 mg/dL (ref 8.4–10.5)
Creat: 0.83 mg/dL (ref 0.50–1.10)
Glucose, Bld: 116 mg/dL — ABNORMAL HIGH (ref 70–99)
POTASSIUM: 4.4 meq/L (ref 3.5–5.3)
Sodium: 135 mEq/L (ref 135–145)
Total Bilirubin: 0.5 mg/dL (ref 0.2–1.2)
Total Protein: 6.9 g/dL (ref 6.0–8.3)

## 2014-07-21 ENCOUNTER — Other Ambulatory Visit: Payer: Self-pay | Admitting: Family Medicine

## 2014-07-22 NOTE — Telephone Encounter (Signed)
Dr Brigitte Pulse, you just saw pt but don't see this med discussed. OK to RF?

## 2014-07-23 NOTE — Telephone Encounter (Signed)
Patient says she needs this medication ASAP. She says she will continue throwing up until she gets this medication. I advised her to come in, she hung up on me

## 2014-07-23 NOTE — Telephone Encounter (Signed)
I sent in 1 mos RF per protocol since pt is having so much trouble without it, and notified pt on VM that I have done this. Dr Brigitte Pulse, I pended 5 more RFs for you to review if you want to give her these, or if you need for her to come back in to be eval for this. Thank you.

## 2014-07-24 MED ORDER — RANITIDINE HCL 150 MG PO TABS: 150.0000 mg | ORAL_TABLET | Freq: Two times a day (BID) | ORAL | Status: DC

## 2014-07-29 ENCOUNTER — Encounter: Payer: Self-pay | Admitting: Family Medicine

## 2014-08-03 ENCOUNTER — Telehealth: Payer: Self-pay

## 2014-08-03 NOTE — Telephone Encounter (Signed)
Patient needs copy of letter where Dr. Brigitte Pulse put her out of work permanently.  Needs by the [employer]th for social security.   [phone]

## 2014-08-05 ENCOUNTER — Other Ambulatory Visit: Payer: Self-pay | Admitting: Family Medicine

## 2014-09-01 ENCOUNTER — Encounter: Payer: Self-pay | Admitting: Endocrinology

## 2014-09-01 ENCOUNTER — Ambulatory Visit (INDEPENDENT_AMBULATORY_CARE_PROVIDER_SITE_OTHER): Payer: Commercial Managed Care - HMO | Admitting: Endocrinology

## 2014-09-01 VITALS — BP 126/88 | HR 81 | Temp 99.0°F | Ht 62.0 in | Wt 168.0 lb

## 2014-09-01 DIAGNOSIS — E042 Nontoxic multinodular goiter: Secondary | ICD-10-CM

## 2014-09-01 DIAGNOSIS — E890 Postprocedural hypothyroidism: Secondary | ICD-10-CM | POA: Diagnosis not present

## 2014-09-01 LAB — TSH: TSH: 3.81 u[IU]/mL (ref 0.35–4.50)

## 2014-09-01 NOTE — Patient Instructions (Addendum)
blood tests are being requested for you today.  We'll contact you with results. Let's recheck the ultrasound.  you will receive a phone call, about a day and time for an appointment Please come back for a follow-up appointment in 4-6 months.

## 2014-09-01 NOTE — Progress Notes (Signed)
Subjective:    Patient ID: [name], female    DOB: 1961/03/22, 54 y.o.   MRN: [id number]  HPI Pt returns for f/u of multinodular goiter (dx'ed 2014; it was found on [place], which had been done to investigate lymphadenopathy of the neck; the nodes appeared benign, but goiter was incidentally noted; bx was c/w benign follicular nodule; she was noted in early 2015 to have suppressed TSH, and she received I-131 rx in April of 2015; in oct of 2015, she was noted to have elevated TSH, and was rx'ed synthroid).  since on the increased synthroid, pt states she feels no different, and well in general.  She does not notice any nodule in the neck Past Medical History  Diagnosis Date  . Back pain, chronic   . Depression   . Myocardial infarction 11/20/06  . Hypertension   . Hypercholesteremia   . Coronary artery disease   . Allergy   . Arthritis   . Neuromuscular disorder   . Bronchitis     hx-no inhalers at home  . Diabetes mellitus     diet controlled  . DDD (degenerative disc disease), lumbar   . Ejection fraction     .  Marland Kitchen Hx of CABG   . Carotid bruit   . Nodule of neck   . Multiple thyroid nodules     Past Surgical History  Procedure Laterality Date  . Mandible surgery  2008  . Knee surgery  2009    lt   . Tubal ligation    . Breast surgery      rt br cystx2  . Abdominal hysterectomy  1984  . Coronary artery bypass graft  2008  . Orif ankle fracture Right 07/12/2012    Procedure: OPEN REDUCTION INTERNAL FIXATION (ORIF) RIGHT ANKLE FRACTURE;  Surgeon: Alta Corning, MD;  Location: [facility];  Service: Orthopedics;  Laterality: Right;    History   Social History  . Marital Status: Married    Spouse Name: N/A  . Number of Children: N/A  . Years of Education: N/A   Occupational History  . Not on file.   Social History Main Topics  . Smoking status: Current Every Day Smoker -- 0.50 packs/day for 29 years    Types: Cigarettes  . Smokeless  tobacco: Never Used  . Alcohol Use: No  . Drug Use: No  . Sexual Activity: Not on file   Other Topics Concern  . Not on file   Social History Narrative    Current Outpatient Prescriptions on File Prior to Visit  Medication Sig Dispense Refill  . aspirin 81 MG tablet Take 81 mg by mouth 2 (two) times daily.     Marland Kitchen buPROPion (WELLBUTRIN SR) 150 MG 12 hr tablet Take 150 mg by mouth daily.    . carvedilol (COREG) 3.125 MG tablet TAKE 1 TABLET (3.125 MG TOTAL) BY MOUTH 2 (TWO) TIMES DAILY. 60 tablet 2  . clonazePAM (KLONOPIN) 0.5 MG tablet Take 0.5 mg by mouth 2 (two) times daily as needed for anxiety.    . diazepam (VALIUM) 5 MG tablet Take 1 tablet (5 mg total) by mouth every 12 (twelve) hours as needed for muscle spasms or sedation. 30 tablet 1  . DULoxetine (CYMBALTA) 60 MG capsule Take 1 capsule (60 mg total) by mouth daily. 30 capsule 5  . fish oil-omega-3 fatty acids 1000 MG capsule Take 2 g by mouth daily.      . Ipratropium-Albuterol (COMBIVENT  RESPIMAT) 20-100 MCG/ACT AERS respimat Inhale 1 puff into the lungs every 6 (six) hours. 4 g 1  . levothyroxine (SYNTHROID, LEVOTHROID) 75 MCG tablet Take 1 tablet (75 mcg total) by mouth daily before breakfast. 30 tablet 5  . oxyCODONE-acetaminophen (PERCOCET/ROXICET) 5-325 MG per tablet Take 1-2 tablets by mouth every 4 (four) hours as needed for moderate pain. 150 tablet 0  . oxyCODONE-acetaminophen (ROXICET) 5-325 MG per tablet Take 1-2 tablets by mouth every 4 (four) hours as needed for severe pain. 150 tablet 0  . oxyCODONE-acetaminophen (ROXICET) 5-325 MG per tablet Take 1-2 tablets by mouth every 8 (eight) hours as needed for severe pain. May fill 60 d after date written 150 tablet 0  . oxyCODONE-acetaminophen (ROXICET) 5-325 MG per tablet Take 1-2 tablets by mouth every 4 (four) hours as needed for severe pain. May fill 90d after date written 150 tablet 0  . QUEtiapine (SEROQUEL) 300 MG tablet Take 200 mg by mouth at bedtime.     .  ranitidine (ZANTAC) 150 MG tablet Take 1 tablet (150 mg total) by mouth 2 (two) times daily. 60 tablet 11  . rosuvastatin (CRESTOR) 20 MG tablet Take 1 tablet (20 mg total) by mouth daily. 30 tablet 5   No current facility-administered medications on file prior to visit.    Allergies  Allergen Reactions  . Ibuprofen Swelling and Rash  . Prednisone Swelling and Rash  . Coconut Flavor   . Cyclobenzaprine   . Morphine Nausea And Vomiting    REACTION: Nausea  . Coconut Oil Swelling and Rash    Family History  Problem Relation Age of Onset  . Heart disease Mother   . Hyperlipidemia Mother   . Hypertension Mother   . Heart disease Father   . Stroke Father   . Hypertension Father   . Hyperlipidemia Father   . Hypertension Sister   . Hypertension Brother   . Cancer Daughter     unknown  . Hypertension Maternal Grandmother   . Hypertension Maternal Grandfather   . Cancer Maternal Grandfather   . Hypertension Paternal Grandmother   . Hypertension Paternal Grandfather   . Cancer Maternal Aunt     breast and ovarian  . Cancer Paternal Aunt     breast  . Colon cancer Neg Hx     BP 126/88 mmHg  Pulse 81  Temp(Src) 99 F (37.2 C) (Oral)  Ht 5\' 2"  (1.575 m)  Wt 168 lb (76.204 kg)  BMI 30.72 kg/m2  SpO2 90%    Review of Systems Denies weight change    Objective:   Physical Exam VITAL SIGNS:  See vs page GENERAL: no distress NECK: small multinodular goiter   Lab Results  Component Value Date   TSH 3.81 09/01/2014      Assessment & Plan:  Post-I-131 hypothyroidism, well-replaced.  Please continue the same synthroid Multinodular goiter, due to f/u US.  We'll recheck

## 2014-09-02 ENCOUNTER — Other Ambulatory Visit: Payer: Self-pay | Admitting: Family Medicine

## 2014-09-10 ENCOUNTER — Encounter: Payer: Self-pay | Admitting: Family Medicine

## 2014-09-11 ENCOUNTER — Encounter: Payer: Self-pay | Admitting: Internal Medicine

## 2014-09-14 ENCOUNTER — Encounter: Payer: Self-pay | Admitting: Family Medicine

## 2014-09-16 ENCOUNTER — Ambulatory Visit (INDEPENDENT_AMBULATORY_CARE_PROVIDER_SITE_OTHER): Payer: Medicare PPO | Admitting: Emergency Medicine

## 2014-09-16 VITALS — BP 118/68 | HR 74 | Temp 98.7°F | Resp 16 | Ht 62.0 in | Wt 169.2 lb

## 2014-09-16 DIAGNOSIS — I8001 Phlebitis and thrombophlebitis of superficial vessels of right lower extremity: Secondary | ICD-10-CM | POA: Diagnosis not present

## 2014-09-16 NOTE — Progress Notes (Signed)
Subjective:  Patient ID: [name], female    DOB: [birth year]  Age: 54 y.o. MRN: [id number]  CC: Other   HPI [name] presents for evaluation of her swollen right leg. She has a history of swollen right leg which she believes was phlebitis and she was treated with Coumadin in another office. She apparently bumped her leg at the gym and has 2 areas of her right lower leg anteromedially that are swollen and bruised and tender. She has no calf tenderness. She has no history of DVT. She has no fever or chills. She is requesting prescription for Coumadin. She has chronic venous disease in right lower leg. No improvement with over the counter medications or other home remedies. Denies other complaint or health concern today.   Outpatient Prescriptions Prior to Visit  Medication Sig Dispense Refill  . aspirin 81 MG tablet Take 81 mg by mouth 2 (two) times daily.     Marland Kitchen buPROPion (WELLBUTRIN SR) 150 MG 12 hr tablet Take 150 mg by mouth daily.    . carvedilol (COREG) 3.125 MG tablet TAKE 1 TABLET (3.125 MG TOTAL) BY MOUTH 2 (TWO) TIMES DAILY. 60 tablet 2  . clonazePAM (KLONOPIN) 0.5 MG tablet Take 0.5 mg by mouth 2 (two) times daily as needed for anxiety.    . diazepam (VALIUM) 5 MG tablet Take 1 tablet (5 mg total) by mouth every 12 (twelve) hours as needed for muscle spasms or sedation. 30 tablet 1  . DULoxetine (CYMBALTA) 60 MG capsule Take 1 capsule (60 mg total) by mouth daily. 30 capsule 5  . fish oil-omega-3 fatty acids 1000 MG capsule Take 2 g by mouth daily.      Marland Kitchen gabapentin (NEURONTIN) 400 MG capsule TAKE 2 CAPSULES (800 MG TOTAL) BY MOUTH 3 (THREE) TIMES DAILY. 180 capsule 1  . Ipratropium-Albuterol (COMBIVENT RESPIMAT) 20-100 MCG/ACT AERS respimat Inhale 1 puff into the lungs every 6 (six) hours. 4 g 1  . levothyroxine (SYNTHROID, LEVOTHROID) 75 MCG tablet Take 1 tablet (75 mcg total) by mouth daily before breakfast. 30 tablet 5  .  oxyCODONE-acetaminophen (PERCOCET/ROXICET) 5-325 MG per tablet Take 1-2 tablets by mouth every 4 (four) hours as needed for moderate pain. 150 tablet 0  . oxyCODONE-acetaminophen (ROXICET) 5-325 MG per tablet Take 1-2 tablets by mouth every 4 (four) hours as needed for severe pain. 150 tablet 0  . oxyCODONE-acetaminophen (ROXICET) 5-325 MG per tablet Take 1-2 tablets by mouth every 8 (eight) hours as needed for severe pain. May fill 60 d after date written 150 tablet 0  . oxyCODONE-acetaminophen (ROXICET) 5-325 MG per tablet Take 1-2 tablets by mouth every 4 (four) hours as needed for severe pain. May fill 90d after date written 150 tablet 0  . QUEtiapine (SEROQUEL) 300 MG tablet Take 200 mg by mouth at bedtime.     . ranitidine (ZANTAC) 150 MG tablet Take 1 tablet (150 mg total) by mouth 2 (two) times daily. 60 tablet 11  . rosuvastatin (CRESTOR) 20 MG tablet Take 1 tablet (20 mg total) by mouth daily. 30 tablet 5   No facility-administered medications prior to visit.    ROS Review of Systems  Constitutional: Negative for fever, chills and appetite change.  HENT: Negative for congestion, ear pain, postnasal drip, sinus pressure and sore throat.   Eyes: Negative for pain and redness.  Respiratory: Negative for cough, shortness of breath and wheezing.   Cardiovascular: Negative for leg swelling.  Gastrointestinal: Negative for  nausea, vomiting, abdominal pain, diarrhea, constipation and blood in stool.  Endocrine: Negative for polyuria.  Genitourinary: Negative for dysuria, urgency, frequency and flank pain.  Musculoskeletal: Negative for gait problem.  Skin: Negative for rash.  Neurological: Negative for weakness and headaches.  Psychiatric/Behavioral: Negative for confusion and decreased concentration. The patient is not nervous/anxious.     Objective:  BP 118/68 mmHg  Pulse 74  Temp(Src) 98.7 F (37.1 C) (Oral)  Resp 16  Ht 5\' 2"  (1.575 m)  Wt 169 lb 3.2 oz (76.749 kg)  BMI 30.94  kg/m2  SpO2 96%  BP Readings from Last 3 Encounters:  09/16/14 118/68  09/01/14 126/88  07/17/14 111/72    Wt Readings from Last 3 Encounters:  09/16/14 169 lb 3.2 oz (76.749 kg)  09/01/14 168 lb (76.204 kg)  07/17/14 162 lb (73.483 kg)    Physical Exam  Constitutional: She is oriented to person, place, and time. She appears well-developed and well-nourished.  HENT:  Head: Normocephalic and atraumatic.  Eyes: Conjunctivae are normal. Pupils are equal, round, and reactive to light.  Neck: Normal range of motion. Neck supple.  Cardiovascular: Normal rate and regular rhythm.   Pulmonary/Chest: Effort normal.  Musculoskeletal: Normal range of motion.  Neurological: She is alert and oriented to person, place, and time. She has normal reflexes.  Skin: Skin is warm. Bruising and ecchymosis noted.  Psychiatric: She has a normal mood and affect. Her behavior is normal. Thought content normal.    Lab Results  Component Value Date   WBC 9.2 07/17/2014   HGB 16.5* 07/17/2014   HCT 48.7* 07/17/2014   PLT 219 07/17/2014   GLUCOSE 116* 07/17/2014   CHOL 129 03/13/2014   TRIG 171* 03/13/2014   HDL 34* 03/13/2014   LDLCALC 61 03/13/2014   ALT <8 07/17/2014   AST 12 07/17/2014   NA 135 07/17/2014   K 4.4 07/17/2014   CL 104 07/17/2014   CREATININE 0.83 07/17/2014   BUN 13 07/17/2014   CO2 22 07/17/2014   TSH 3.81 09/01/2014   INR 1.1 11/27/2006   HGBA1C 6.6 07/17/2014   MICROALBUR 0.50 02/08/2012      Assessment & Plan:   [name] was seen today for other.  Diagnoses and all orders for this visit:  Superficial phlebitis and thrombophlebitis of the leg, right   I am having [name] maintain her fish oil-omega-3 fatty acids, aspirin, DULoxetine, rosuvastatin, Ipratropium-Albuterol, buPROPion, QUEtiapine, levothyroxine, clonazePAM, oxyCODONE-acetaminophen, oxyCODONE-acetaminophen, oxyCODONE-acetaminophen, oxyCODONE-acetaminophen, diazepam, ranitidine, carvedilol,  gabapentin, and traZODone.  Meds ordered this encounter  Medications  . traZODone (DESYREL) 50 MG tablet    Sig: Take 50 mg by mouth at bedtime.     Follow-up: Return if symptoms worsen or fail to improve. red flag concerns and appropriate action was discussed with the patient  Roselee Culver, MD

## 2014-09-16 NOTE — Patient Instructions (Signed)
Phlebitis Phlebitis is soreness and swelling (inflammation) of a vein. This can occur in your arms, legs, or torso (trunk), as well as deeper inside your body. Phlebitis is usually not serious when it occurs close to the surface of the body. However, it can cause serious problems when it occurs in a vein deeper inside the body. CAUSES  Phlebitis can be triggered by various things, including:   Reduced blood flow through your veins. This can happen with:  Bed rest over a long period.  Long-distance travel.  Injury.  Surgery.  Being overweight (obese) or pregnant.  Having an IV tube put in the vein and getting certain medicines through the vein.  Cancer and cancer treatment.  Use of illegal drugs taken through the vein.  Inflammatory diseases.  Inherited (genetic) diseases that increase the risk of blood clots.  Hormone therapy, such as birth control pills. SIGNS AND SYMPTOMS   Red, tender, swollen, and painful area on your skin. Usually, the area will be long and narrow.  Firmness along the center of the affected area. This can indicate that a blood clot has formed.  Low-grade fever. DIAGNOSIS  A health care provider can usually diagnose phlebitis by examining the affected area and asking about your symptoms. To check for infection or blood clots, your health care provider may order blood tests or an ultrasound exam of the area. Blood tests and your family history may also indicate if you have an underlying genetic disease that causes blood clots. Occasionally, a piece of tissue is taken from the body (biopsy sample) if an unusual cause of phlebitis is suspected. TREATMENT  Treatment will vary depending on the severity of the condition and the area of the body affected. Treatment may include:  Use of a warm compress or heating pad.  Use of compression stockings or bandages.  Anti-inflammatory medicines.  Removal of any IV tube that may be causing the problem.  Medicines  that kill germs (antibiotics) if an infection is present.  Blood-thinning medicines if a blood clot is suspected or present.  In rare cases, surgery may be needed to remove damaged sections of vein. HOME CARE INSTRUCTIONS   Only take over-the-counter or prescription medicines as directed by your health care provider. Take all medicines exactly as prescribed.  Raise (elevate) the affected area above the level of your heart as directed by your health care provider.  Apply a warm compress or heating pad to the affected area as directed by your health care provider. Do not sleep with the heating pad.  Use compression stockings or bandages as directed. These will speed healing and prevent the condition from coming back.  If you are on blood thinners:  Get follow-up blood tests as directed by your health care provider.  Check with your health care provider before using any new medicines.  Carry a medical alert card or wear your medical alert jewelry to show that you are on blood thinners.  For phlebitis in the legs:  Avoid prolonged standing or bed rest.  Keep your legs moving. Raise your legs when sitting or lying.  Do not smoke.  Women, particularly those over the age of 35, should consider the risks and benefits of taking the contraceptive pill. This kind of hormone treatment can increase your risk for blood clots.  Follow up with your health care provider as directed. SEEK MEDICAL CARE IF:   You have unusual bruising or any bleeding problems.  Your swelling or pain in the affected area   is not improving. °· You are on anti-inflammatory medicine, and you develop belly (abdominal) pain. °SEEK IMMEDIATE MEDICAL CARE IF:  °· You have a sudden onset of chest pain or difficulty breathing. °· You have a fever or persistent symptoms for more than 2-3 days. °· You have a fever and your symptoms suddenly get worse. °MAKE SURE YOU: °· Understand these instructions. °· Will watch your  condition. °· Will get help right away if you are not doing well or get worse. °Document Released: 04/11/2001 Document Revised: 02/05/2013 Document Reviewed: 12/23/2012 °Exit[employer]® Patient Information ©2015 [employer]. This information is not intended to replace advice given to you by your health care provider. Make sure you discuss any questions you have with your health care provider. ° °

## 2014-09-24 ENCOUNTER — Ambulatory Visit
Admission: RE | Admit: 2014-09-24 | Discharge: 2014-09-24 | Disposition: A | Discharge status: Home / Self Care | Payer: Medicare PPO | Source: Ambulatory Visit | Attending: Endocrinology | Admitting: Endocrinology

## 2014-09-24 DIAGNOSIS — E042 Nontoxic multinodular goiter: Secondary | ICD-10-CM

## 2014-10-05 ENCOUNTER — Other Ambulatory Visit: Payer: Self-pay | Admitting: Family Medicine

## 2014-10-09 DIAGNOSIS — E039 Hypothyroidism, unspecified: Secondary | ICD-10-CM | POA: Insufficient documentation

## 2014-10-09 DIAGNOSIS — I10 Essential (primary) hypertension: Secondary | ICD-10-CM | POA: Insufficient documentation

## 2014-10-09 DIAGNOSIS — F3132 Bipolar disorder, current episode depressed, moderate: Secondary | ICD-10-CM | POA: Insufficient documentation

## 2014-10-09 DIAGNOSIS — M533 Sacrococcygeal disorders, not elsewhere classified: Secondary | ICD-10-CM | POA: Insufficient documentation

## 2014-10-09 DIAGNOSIS — G894 Chronic pain syndrome: Secondary | ICD-10-CM | POA: Insufficient documentation

## 2014-10-09 DIAGNOSIS — R296 Repeated falls: Secondary | ICD-10-CM | POA: Insufficient documentation

## 2014-10-09 DIAGNOSIS — Z8781 Personal history of (healed) traumatic fracture: Secondary | ICD-10-CM | POA: Insufficient documentation

## 2014-10-28 ENCOUNTER — Other Ambulatory Visit: Payer: Self-pay | Admitting: Physician Assistant

## 2014-11-13 ENCOUNTER — Other Ambulatory Visit: Payer: Self-pay | Admitting: Physician Assistant

## 2014-11-20 ENCOUNTER — Ambulatory Visit: Payer: Medicare PPO | Admitting: Family Medicine

## 2014-12-02 ENCOUNTER — Telehealth: Payer: Self-pay | Admitting: Cardiology

## 2014-12-02 NOTE — Telephone Encounter (Signed)
Pt calling to ask if back in 2008 when she had a cardiac cath done by Dr Lia Foyer, did he place a stent in.  Pt calling to ask this because she will be getting an MRI done on Monday 12/07/14, and per her Doctor who ordered this image, she must provide a stent card or documentation of what type of stent she had placed, for safety reasons.  Pt states she never received a stent card.  Informed the pt that she will not be able to receive a stent card now, but we can pull up her cath report from 11/21/2006, that shows thorough documentation of the pt having 2 stents placed by Dr Lia Foyer.  One stent was a 3x23 Vision stent and the other was 3.5x32 Liberte stent.  Informed the pt that I will place her cardiac cath report, with highlighted important information about what stents she has, at the front desk for her to pick up today, and give this to the ordering doctor.  Pt verbalized understanding, agrees with this plan, and gracious for all the assistance provided.

## 2014-12-02 NOTE — Telephone Encounter (Signed)
New message      Pt want to know if she had a stent put in back in 2008?  She said she was scheduled to have a heart cath and she thinks they were not able to do the stent----but is not sure

## 2014-12-09 DIAGNOSIS — I252 Old myocardial infarction: Secondary | ICD-10-CM | POA: Insufficient documentation

## 2014-12-29 ENCOUNTER — Other Ambulatory Visit: Payer: Self-pay | Admitting: Endocrinology

## 2014-12-30 ENCOUNTER — Other Ambulatory Visit: Payer: Self-pay | Admitting: Family Medicine

## 2015-01-11 ENCOUNTER — Ambulatory Visit: Payer: Medicare PPO | Admitting: Cardiology

## 2015-01-19 ENCOUNTER — Telehealth: Payer: Self-pay | Admitting: Endocrinology

## 2015-01-19 MED ORDER — LEVOTHYROXINE SODIUM 75 MCG PO TABS: ORAL_TABLET | ORAL | Status: DC

## 2015-01-19 NOTE — Telephone Encounter (Signed)
Rx sent per pt's request.  

## 2015-01-19 NOTE — Telephone Encounter (Signed)
Pt needs [place] to call [facility] 90 day supply [phone] F [phone]  Pt needs levothyroxine 75 mcg

## 2015-01-29 ENCOUNTER — Ambulatory Visit (INDEPENDENT_AMBULATORY_CARE_PROVIDER_SITE_OTHER): Payer: Medicare HMO | Admitting: Cardiology

## 2015-01-29 ENCOUNTER — Other Ambulatory Visit: Payer: Self-pay

## 2015-01-29 ENCOUNTER — Encounter: Payer: Self-pay | Admitting: Cardiology

## 2015-01-29 VITALS — Ht 62.0 in

## 2015-01-29 DIAGNOSIS — E785 Hyperlipidemia, unspecified: Secondary | ICD-10-CM | POA: Diagnosis not present

## 2015-01-29 DIAGNOSIS — F17200 Nicotine dependence, unspecified, uncomplicated: Secondary | ICD-10-CM

## 2015-01-29 DIAGNOSIS — Z720 Tobacco use: Secondary | ICD-10-CM

## 2015-01-29 DIAGNOSIS — I10 Essential (primary) hypertension: Secondary | ICD-10-CM

## 2015-01-29 DIAGNOSIS — I2583 Coronary atherosclerosis due to lipid rich plaque: Secondary | ICD-10-CM

## 2015-01-29 DIAGNOSIS — I2510 Atherosclerotic heart disease of native coronary artery without angina pectoris: Secondary | ICD-10-CM

## 2015-01-29 DIAGNOSIS — I25810 Atherosclerosis of coronary artery bypass graft(s) without angina pectoris: Secondary | ICD-10-CM | POA: Diagnosis not present

## 2015-01-29 NOTE — Assessment & Plan Note (Signed)
I spoke with the patient at length today encouraging her to continue to try to stop smoking. She says that she will try some time in the future.

## 2015-01-29 NOTE — Patient Instructions (Signed)
Medication Instructions:  Same-no changes  Labwork: None  Testing/Procedures: None  Follow-Up: Your physician wants you to follow-up in: 1 year. You will receive a reminder letter in the mail two months in advance. If you don't receive a letter, please call our office to schedule the follow-up appointment.      

## 2015-01-29 NOTE — Assessment & Plan Note (Signed)
Coronary disease is stable. No further testing is needed at this time. She is on aspirin and Crestor. I have urged her to stop smoking.

## 2015-01-29 NOTE — Progress Notes (Signed)
Cardiology Office Note   Date:  01/29/2015   ID:  [name], [name] May 30, 1960, MRN [id number]  PCP:  [name], MD  Cardiologist:  Dola Argyle, MD   Chief Complaint  Patient presents with  . Appointment    Follow-up coronary artery disease      History of Present Illness: [name] is a 54 y.o. female who presents today to follow-up coronary artery disease. I saw her last September, 2015. She is stable and doing well. She is not having any significant chest discomfort. She underwent bypass surgery in 2008. She had a nuclear stress study in 2014 revealing no ischemia. LV function is good.  Past Medical History  Diagnosis Date  . Back pain, chronic   . Depression   . Myocardial infarction 11/20/06  . Hypertension   . Hypercholesteremia   . Coronary artery disease   . Allergy   . Arthritis   . Neuromuscular disorder   . Bronchitis     hx-no inhalers at home  . Diabetes mellitus     diet controlled  . DDD (degenerative disc disease), lumbar   . Ejection fraction     .  Marland Kitchen Hx of CABG   . Carotid bruit   . Nodule of neck   . Multiple thyroid nodules     Past Surgical History  Procedure Laterality Date  . Mandible surgery  2008  . Knee surgery  2009    lt   . Tubal ligation    . Breast surgery      rt br cystx2  . Abdominal hysterectomy  1984  . Coronary artery bypass graft  2008  . Orif ankle fracture Right 07/12/2012    Procedure: OPEN REDUCTION INTERNAL FIXATION (ORIF) RIGHT ANKLE FRACTURE;  Surgeon: Alta Corning, MD;  Location: [facility];  Service: Orthopedics;  Laterality: Right;    Patient Active Problem List   Diagnosis Date Noted  . 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 Prediabetes 03/17/2014  . Multinodular goiter 09/15/2013  . Hyperthyroidism 07/11/2013  . Carotid bruit   . Depression   . Coronary artery disease   . Hypertension   . Hypercholesteremia   . Neuromuscular disorder    . Arthritis   . DDD (degenerative disc disease), lumbar   . Ejection fraction   . Hx of CABG   . Glucose intolerance (pre-diabetes) 02/08/2012  . Encounter for long-term (current) use of other medications 02/08/2012  . Spinal stenosis of lumbar region 02/08/2012  . Neuropathy 02/08/2012  . Superficial phlebitis and thrombophlebitis of the leg 04/09/2011  . Back pain, chronic   . TOBACCO ABUSE 11/20/2008      Current Outpatient Prescriptions  Medication Sig Dispense Refill  . albuterol-ipratropium (COMBIVENT) 18-103 MCG/ACT inhaler Inhale 1 puff into the lungs every 6 (six) hours as needed for wheezing or shortness of breath (Shortness of Breath).    Marland Kitchen aspirin 81 MG tablet Take 81 mg by mouth 2 (two) times daily.     Marland Kitchen buPROPion (WELLBUTRIN SR) 150 MG 12 hr tablet Take 150 mg by mouth every morning.     . carvedilol (COREG) 3.125 MG tablet TAKE 1 TABLET (3.125 MG TOTAL) BY MOUTH 2 (TWO) TIMES DAILY. 60 tablet 2  . clonazePAM (KLONOPIN) 0.5 MG tablet Take 0.5 mg by mouth 2 (two) times daily as needed for anxiety.    . diazepam (VALIUM) 5 MG tablet TAKE 1 TABLET BY MOUTH EVERY TWELVE HOURS AS NEEDED FOR  MUSCLE SPASM OR SEDATION 30 tablet 0  . DULoxetine (CYMBALTA) 60 MG capsule Take 60 mg by mouth 2 (two) times daily.    . fish oil-omega-3 fatty acids 1000 MG capsule Take 2 g by mouth daily.      Marland Kitchen gabapentin (NEURONTIN) 400 MG capsule Take 1 capsule by mouth 3 (three) times daily.    . Ipratropium-Albuterol (COMBIVENT RESPIMAT) 20-100 MCG/ACT AERS respimat Inhale 1 puff into the lungs every 6 (six) hours. 4 g 1  . levothyroxine (SYNTHROID, LEVOTHROID) 75 MCG tablet TAKE 1 TABLET (75 MCG TOTAL) BY MOUTH DAILY BEFORE BREAKFAST. 90 tablet 2  . oxyCODONE-acetaminophen (PERCOCET) 7.5-325 MG tablet Take 1 tablet by mouth every 6 (six) hours as needed for severe pain (back pain).    . QUEtiapine (SEROQUEL) 200 MG tablet Take 200 mg by mouth 3 (three) times daily.    . ranitidine (ZANTAC) 150  MG tablet Take 1 tablet (150 mg total) by mouth 2 (two) times daily. 60 tablet 11  . rosuvastatin (CRESTOR) 20 MG tablet Take 20 mg by mouth daily.    . traZODone (DESYREL) 50 MG tablet Take 50 mg by mouth at bedtime.     No current facility-administered medications for this visit.    Allergies:   Ibuprofen; Prednisone; Cyclobenzaprine; Morphine; Coconut flavor; and Coconut oil    Social History:  The patient  reports that she has been smoking Cigarettes.  She has a 14.5 pack-year smoking history. She has never used smokeless tobacco. She reports that she does not drink alcohol or use illicit drugs.   Family History:  The patient's  family history includes Cancer in her daughter, maternal aunt, maternal grandfather, and paternal aunt; Heart disease in her father and mother; Hyperlipidemia in her father and mother; Hypertension in her brother, father, maternal grandfather, maternal grandmother, mother, paternal grandfather, paternal grandmother, and sister; Stroke in her father. There is no history of Colon cancer.    ROS:  Please see the history of present illness.    Patient denies fever, chills, headache, sweats, rash, change in vision, change in hearing, chest pain, cough, nausea or vomiting, urinary symptoms. All other systems are reviewed and are negative.    PHYSICAL EXAM: VS:  Ht 5\' 2"  (1.575 m) , Blood pressure is normal at 116/70. Pulse is 75. Patient is oriented to person time and place. Affect is normal. Head is atraumatic. Sclera and conjunctiva are normal. There is no jugular venous distention. Lungs are clear. Respiratory effort is nonlabored. Cardiac exam reveals S1 and S2. Abdomen is soft. There is no peripheral edema. There are no musculoskeletal deformities. 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   EKG:   EKG is done today and reviewed by me. 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 There is no change from the past.   Recent Labs: 07/17/2014: ALT <8; BUN 13; Creat  0.83; Hemoglobin 16.5*; Platelets 219; Potassium 4.4; Sodium 135 09/01/2014: TSH 3.81    Lipid Panel    Component Value Date/Time   CHOL 129 03/13/2014 1526   TRIG 171* 03/13/2014 1526   HDL 34* 03/13/2014 1526   CHOLHDL 3.8 03/13/2014 1526   VLDL 34 03/13/2014 1526   LDLCALC 61 03/13/2014 1526      Wt Readings from Last 3 Encounters:  09/16/14 169 lb 3.2 oz (76.749 kg)  09/01/14 168 lb (76.204 kg)  07/17/14 162 lb (73.483 kg)      Current medicines are reviewed  The patient understands her  medications.     ASSESSMENT AND PLAN:

## 2015-01-29 NOTE — Assessment & Plan Note (Signed)
Patient is on Crestor. No change in therapy.

## 2015-06-14 ENCOUNTER — Other Ambulatory Visit: Payer: Self-pay

## 2015-06-14 DIAGNOSIS — Z1231 Encounter for screening mammogram for malignant neoplasm of breast: Secondary | ICD-10-CM

## 2015-06-15 ENCOUNTER — Ambulatory Visit
Admission: RE | Admit: 2015-06-15 | Discharge: 2015-06-15 | Disposition: A | Discharge status: Home / Self Care | Payer: Medicare HMO | Source: Ambulatory Visit

## 2015-06-15 DIAGNOSIS — Z1231 Encounter for screening mammogram for malignant neoplasm of breast: Secondary | ICD-10-CM

## 2015-06-16 DIAGNOSIS — J069 Acute upper respiratory infection, unspecified: Secondary | ICD-10-CM | POA: Insufficient documentation

## 2015-07-29 ENCOUNTER — Ambulatory Visit (INDEPENDENT_AMBULATORY_CARE_PROVIDER_SITE_OTHER): Payer: Medicare HMO | Admitting: Endocrinology

## 2015-07-29 ENCOUNTER — Encounter: Payer: Self-pay | Admitting: Endocrinology

## 2015-07-29 VITALS — BP 122/70 | HR 70 | Temp 98.4°F | Ht 62.0 in | Wt 163.0 lb

## 2015-07-29 DIAGNOSIS — E119 Type 2 diabetes mellitus without complications: Secondary | ICD-10-CM

## 2015-07-29 LAB — HEMOGLOBIN A1C: Hgb A1c MFr Bld: 7.6 % — ABNORMAL HIGH (ref 4.6–6.5)

## 2015-07-29 LAB — TSH: TSH: 1.98 u[IU]/mL (ref 0.35–4.50)

## 2015-07-29 MED ORDER — METFORMIN HCL ER 500 MG PO TB24: 1000.0000 mg | ORAL_TABLET | Freq: Every day | ORAL | Status: DC

## 2015-07-29 NOTE — Progress Notes (Signed)
Subjective:    Patient ID: [name], female    DOB: 30-Aug-1960, 55 y.o.   MRN: [id number]  HPI Pt returns for f/u of multinodular goiter (dx'ed 2014; it was found on [place], which had been done to investigate lymphadenopathy of the neck; the nodes appeared benign, but goiter was incidentally noted; bx was c/w benign follicular nodule; she was noted in early 2015 to have suppressed TSH, and she received I-131 rx in April of 2015; in Oct of 2015, she was noted to have elevated TSH, and was rx'ed synthroid).  since on the increased synthroid, pt states she feels no different, and well in general.  She does not notice any nodule in the neck.   She was recently found to have diabetes.  She did not tolerate metformin (GI sxs).  She has slightly numbness of the feet, but no assoc pain. She has been on tradjenta x 2 months.  She says cbg's vary from 91-100's.   Past Medical History  Diagnosis Date  . Back pain, chronic   . Depression   . Myocardial infarction ([place]) 11/20/06  . Hypertension   . Hypercholesteremia   . Coronary artery disease   . Allergy   . Arthritis   . Neuromuscular disorder ([place])   . Bronchitis     hx-no inhalers at home  . Diabetes mellitus     diet controlled  . DDD (degenerative disc disease), lumbar   . Ejection fraction     .  Marland Kitchen Hx of CABG   . Carotid bruit   . Nodule of neck   . Multiple thyroid nodules     Past Surgical History  Procedure Laterality Date  . Mandible surgery  2008  . Knee surgery  2009    lt   . Tubal ligation    . Breast surgery      rt br cystx2  . Abdominal hysterectomy  1984  . Coronary artery bypass graft  2008  . Orif ankle fracture Right 07/12/2012    Procedure: OPEN REDUCTION INTERNAL FIXATION (ORIF) RIGHT ANKLE FRACTURE;  Surgeon: Alta Corning, MD;  Location: [facility];  Service: Orthopedics;  Laterality: Right;    Social History   Social History  . Marital Status: Married    Spouse Name: N/A  .  Number of Children: N/A  . Years of Education: N/A   Occupational History  . Not on file.   Social History Main Topics  . Smoking status: Current Every Day Smoker -- 0.50 packs/day for 29 years    Types: Cigarettes  . Smokeless tobacco: Never Used  . Alcohol Use: No  . Drug Use: No  . Sexual Activity: Not on file   Other Topics Concern  . Not on file   Social History Narrative    Current Outpatient Prescriptions on File Prior to Visit  Medication Sig Dispense Refill  . aspirin 81 MG tablet Take 81 mg by mouth 2 (two) times daily.     Marland Kitchen buPROPion (WELLBUTRIN SR) 150 MG 12 hr tablet Take 150 mg by mouth every morning.     . carvedilol (COREG) 3.125 MG tablet TAKE 1 TABLET (3.125 MG TOTAL) BY MOUTH 2 (TWO) TIMES DAILY. 60 tablet 2  . diazepam (VALIUM) 5 MG tablet TAKE 1 TABLET BY MOUTH EVERY TWELVE HOURS AS NEEDED FOR MUSCLE SPASM OR SEDATION 30 tablet 0  . DULoxetine (CYMBALTA) 60 MG capsule Take 60 mg by mouth 2 (two) times daily.    Marland Kitchen  fish oil-omega-3 fatty acids 1000 MG capsule Take 2 g by mouth daily.      Marland Kitchen gabapentin (NEURONTIN) 400 MG capsule Take 1 capsule by mouth 3 (three) times daily.    Marland Kitchen levothyroxine (SYNTHROID, LEVOTHROID) 75 MCG tablet TAKE 1 TABLET (75 MCG TOTAL) BY MOUTH DAILY BEFORE BREAKFAST. 90 tablet 2  . oxyCODONE-acetaminophen (PERCOCET) 7.5-325 MG tablet Take 1 tablet by mouth every 6 (six) hours as needed for severe pain (back pain).    . QUEtiapine (SEROQUEL) 200 MG tablet Take 200 mg by mouth 3 (three) times daily.    . ranitidine (ZANTAC) 150 MG tablet Take 1 tablet (150 mg total) by mouth 2 (two) times daily. 60 tablet 11  . rosuvastatin (CRESTOR) 20 MG tablet Take 20 mg by mouth daily.    . traZODone (DESYREL) 50 MG tablet Take 50 mg by mouth at bedtime.    Marland Kitchen albuterol-ipratropium (COMBIVENT) 18-103 MCG/ACT inhaler Inhale 1 puff into the lungs every 6 (six) hours as needed for wheezing or shortness of breath (Shortness of Breath). Reported on 07/29/2015     . clonazePAM (KLONOPIN) 0.5 MG tablet Take 0.5 mg by mouth 2 (two) times daily as needed for anxiety. Reported on 07/29/2015    . Ipratropium-Albuterol (COMBIVENT RESPIMAT) 20-100 MCG/ACT AERS respimat Inhale 1 puff into the lungs every 6 (six) hours. (Patient not taking: Reported on 07/29/2015) 4 g 1   No current facility-administered medications on file prior to visit.    Allergies  Allergen Reactions  . Ibuprofen Swelling and Rash  . Prednisone Swelling and Rash  . Cyclobenzaprine Other (See Comments)    unknown  . Morphine Nausea And Vomiting    REACTION: Nausea  . Coconut Flavor Rash  . Coconut Oil Swelling and Rash    Family History  Problem Relation Age of Onset  . Heart disease Mother   . Hyperlipidemia Mother   . Hypertension Mother   . Heart disease Father   . Stroke Father   . Hypertension Father   . Hyperlipidemia Father   . Hypertension Sister   . Hypertension Brother   . Cancer Daughter     unknown  . Hypertension Maternal Grandmother   . Hypertension Maternal Grandfather   . Cancer Maternal Grandfather   . Hypertension Paternal Grandmother   . Hypertension Paternal Grandfather   . Cancer Maternal Aunt     breast and ovarian  . Cancer Paternal Aunt     breast  . Colon cancer Neg Hx     BP 122/70 mmHg  Pulse 70  Temp(Src) 98.4 F (36.9 C) (Oral)  Ht 5\' 2"  (1.575 m)  Wt 163 lb (73.936 kg)  BMI 29.81 kg/m2  SpO2 90%  Review of Systems No weight change    Objective:   Physical Exam VITAL SIGNS:  See vs page GENERAL: no distress NECK: multinodular goiter is again noted Pulses: dorsalis pedis intact bilat.   MSK: no deformity of the feet CV: no leg edema Skin:  no ulcer on the feet.  normal temp on the feet.  Large port-wine area on the right leg and foot. Neuro: sensation is intact to touch on the feet, but decreased from normal.    Lab Results  Component Value Date   TSH 1.98 07/29/2015    A1c=7.5%    Assessment & Plan:  DM: New to  me.  She needs increased rx, if it can be done with a regimen that avoids or minimizes hypoglycemia. Post-I-131 hypothyroidism: well-controlled. multinodular goiter:  clinically unchanged   Patient is advised the following: Patient Instructions  good diet and exercise significantly improve the control of your diabetes.  please let me know if you wish to be referred to a dietician.  high blood sugar is very risky to your health.  you should see an eye doctor and dentist every year.  It is very important to get all recommended vaccinations.  controlling your blood pressure and cholesterol drastically reduces the damage diabetes does to your body.  Those who smoke should quit.  please discuss these with your doctor.  check your blood sugar once a day.  vary the time of day when you check, between before the 3 meals, and at bedtime.  also check if you have symptoms of your blood sugar being too high or too low.  please keep a record of the readings and bring it to your next appointment here (or you can bring the meter itself).  You can write it on any piece of paper.  please call us sooner if your blood sugar goes below 70, or if you have a lot of readings over 200. blood tests are requested for you today.  We'll let you know about the results. Our a1c goal will be 6.0%.  If it is higher today, we'll add slow-release metformin, at a low amount at first. Please come back for a follow-up appointment in 3 months.

## 2015-07-29 NOTE — Patient Instructions (Signed)
good diet and exercise significantly improve the control of your diabetes.  please let me know if you wish to be referred to a dietician.  high blood sugar is very risky to your health.  you should see an eye doctor and dentist every year.  It is very important to get all recommended vaccinations.  controlling your blood pressure and cholesterol drastically reduces the damage diabetes does to your body.  Those who smoke should quit.  please discuss these with your doctor.  check your blood sugar once a day.  vary the time of day when you check, between before the 3 meals, and at bedtime.  also check if you have symptoms of your blood sugar being too high or too low.  please keep a record of the readings and bring it to your next appointment here (or you can bring the meter itself).  You can write it on any piece of paper.  please call us sooner if your blood sugar goes below 70, or if you have a lot of readings over 200. blood tests are requested for you today.  We'll let you know about the results. Our a1c goal will be 6.0%.  If it is higher today, we'll add slow-release metformin, at a low amount at first. Please come back for a follow-up appointment in 3 months.

## 2015-08-06 ENCOUNTER — Encounter: Payer: Self-pay | Admitting: Dietician

## 2015-08-06 ENCOUNTER — Encounter: Payer: Medicare HMO | Attending: Endocrinology | Admitting: Dietician

## 2015-08-06 VITALS — Ht 62.0 in | Wt 164.0 lb

## 2015-08-06 DIAGNOSIS — E119 Type 2 diabetes mellitus without complications: Secondary | ICD-10-CM | POA: Diagnosis not present

## 2015-08-06 NOTE — Patient Instructions (Addendum)
Plan:  Aim for 2 Carb Choices per meal (30 grams) +/- 1 either way  Aim for 0-1 Carbs per snack if hungry  Include protein in moderation with your meals and snacks Consider reading food labels for Total Carbohydrate and Fat Grams of foods Consider  increasing your activity level by walking or exercise class for 30 minutes daily as tolerated Consider checking BG at alternate times per day as directed by MD  Consider taking medication as directed by MD  Breakfast, lunch, dinner daily.

## 2015-08-06 NOTE — Progress Notes (Signed)
Medical Nutrition Therapy:  Appt start time: 1115 end time:  1220.   Assessment:  Primary concerns today: Patient is here alone with recently diagnosed type 2 diabetes.  She would like to learn how to eat.  She would like to lose weight.  Newly diagnosed diabetes after struggling with prediabetes in the past.  History includes bipolar, hyperlipidemia, HTN, MI and CABG, and significant back problems causing lower extremity nerve damage and increased risk of falls at times.  Her hgbA1C was 7.6% 07/29/15 increased from 6.6% one year prior.  She has recently gotten her eyes examined.  She checks her blood sugar 3 times daily and is concerned that her fasting at times is up to 300.  It was 130 this am.  She lives with son. She also has her grandchildren each weekend and all of the time in the summer.  Her husband comes a couple times per month and works out of town. She does the shopping and cooking.  She is on disability due to back issues and bipolar disorder.  She reports that her daughter died from cancer and she gained weight prior to her death.  UBW 125 lbs and exercised 5x/week at that time.  Her weight was 164 lbs today.    Preferred Learning Style:   No preference indicated   Learning Readiness:   Ready  MEDICATIONS: see list to include:  Tradjenta and Glucophage-XR   DIETARY INTAKE: Has eaten one meal per day for years.  States that she is often not hungry. Usual eating pattern includes 1 meals and 1 snacks per day.  24-hr recall:  B ( AM): skips or banana Snk ( AM): none  L (12-1 PM): Mcdonald's:  Meat patty with lettuce and tomato or salad and unsweeted tea OR raisins and fiber one bar Snk ( PM): none D ( PM): none Snk ( PM): peanuts or granola bar or LS ritz crackers with peanut butter AND banana Beverages: water, unsweetened tea  Usual physical activity: Monday and Wednesday goes to the [facility] with mother and does the [employer] class ([employer])  Estimated energy  needs: 1200 calories 135 g carbohydrates 75 g protein 40 g fat  Progress Towards Goal(s):  In progress.   Nutritional Diagnosis:  NB-1.1 Food and nutrition-related knowledge deficit As related to balance of carbohydrate, protien, and fat.  As evidenced by diet hx and patient report.    Intervention:  Nutrition counseling and diabetes education initiated. Discussed Carb Counting by food group as method of portion control, reading food labels, and benefits of increased activity. Also discussed basic physiology of Diabetes, target BG ranges pre and post meals, and A1c.   Plan:  Aim for 2 Carb Choices per meal (30 grams) +/- 1 either way  Aim for 0-1 Carbs per snack if hungry  Include protein in moderation with your meals and snacks Consider reading food labels for Total Carbohydrate and Fat Grams of foods Consider  increasing your activity level by walking or exercise class for 30 minutes daily as tolerated Consider checking BG at alternate times per day as directed by MD  Consider taking medication as directed by MD  Breakfast, lunch, dinner daily.  Teaching Method Utilized:  Visual Auditory Hands on  Handouts given during visit include: Living Well with Diabetes Food Label handouts Meal Plan Card Label reading Snack list  A1C sheet  Barriers to learning/adherence to lifestyle change: none  Demonstrated degree of understanding via:  Teach Back   Monitoring/Evaluation:  Dietary intake,  exercise, label reading, and body weight prn.

## 2015-08-12 ENCOUNTER — Other Ambulatory Visit: Payer: Self-pay | Admitting: *Deleted

## 2015-08-12 ENCOUNTER — Telehealth: Payer: Self-pay | Admitting: Endocrinology

## 2015-08-12 MED ORDER — ACCU-CHEK AVIVA PLUS VI STRP: ORAL_STRIP | Status: DC

## 2015-08-12 NOTE — Telephone Encounter (Signed)
Pt called to make [place] aware that [facility] is sending a prescription request for strips because she now has to test every 2 hours after eating.

## 2015-08-16 ENCOUNTER — Other Ambulatory Visit: Payer: Self-pay

## 2015-08-16 MED ORDER — ACCU-CHEK AVIVA PLUS VI STRP: ORAL_STRIP | Status: DC

## 2015-08-16 NOTE — Telephone Encounter (Signed)
Noted  

## 2015-08-17 ENCOUNTER — Telehealth: Payer: Self-pay | Admitting: Endocrinology

## 2015-08-17 NOTE — Telephone Encounter (Signed)
See note below and please advise, Thanks! 

## 2015-08-17 NOTE — Telephone Encounter (Signed)
I contacted the pt and left a vm advising of note below. She voiced understanding and stated she would call back on Friday to report how she is feeling.

## 2015-08-17 NOTE — Telephone Encounter (Signed)
D/c tradjenta Please call in a few days to tell us if you are feeling better. Then we can add a different med

## 2015-08-17 NOTE — Telephone Encounter (Signed)
Patient think she is having side affect the medication metformin or either the Tradjenta,  She is coughing and headache and hoarseness, please advise

## 2015-08-20 ENCOUNTER — Telehealth: Payer: Self-pay | Admitting: Endocrinology

## 2015-08-20 MED ORDER — SAXAGLIPTIN HCL 5 MG PO TABS: 5.0000 mg | ORAL_TABLET | Freq: Every day | ORAL | Status: DC

## 2015-08-20 NOTE — Telephone Encounter (Signed)
Ok.  i have sent a prescription to your pharmacy i'll see you next time.

## 2015-08-20 NOTE — Telephone Encounter (Signed)
I contacted the pt and left a vm advising Onglyza has been sent in place of Tradjenta. Requested a call back if the pt would like to discuss.

## 2015-08-20 NOTE — Telephone Encounter (Signed)
PT said the symptoms went away without taking the Tradjenta.  She stated if he wants to order something else, he can go ahead and sent it to the [facility]

## 2015-08-20 NOTE — Telephone Encounter (Signed)
See note below and please advise, Thanks! 

## 2015-08-23 DIAGNOSIS — Z7689 Persons encountering health services in other specified circumstances: Secondary | ICD-10-CM

## 2015-08-30 ENCOUNTER — Other Ambulatory Visit: Payer: Self-pay | Admitting: *Deleted

## 2015-08-30 MED ORDER — ACCU-CHEK AVIVA PLUS VI STRP: ORAL_STRIP | Status: DC

## 2015-09-08 ENCOUNTER — Telehealth: Payer: Self-pay | Admitting: Endocrinology

## 2015-09-08 NOTE — Telephone Encounter (Signed)
PA papers for  Medication (ONGLYZA) 5 MG need to be sent back to [facility] in order for patient to get medication.

## 2015-09-08 NOTE — Telephone Encounter (Signed)
Pa and formulary printed and placed on MD's desk. Waiting on response on how to proceed.

## 2015-09-09 ENCOUNTER — Telehealth: Payer: Self-pay | Admitting: Endocrinology

## 2015-09-09 MED ORDER — SITAGLIPTIN PHOSPHATE 100 MG PO TABS: 100.0000 mg | ORAL_TABLET | Freq: Every day | ORAL | Status: DC

## 2015-09-09 NOTE — Telephone Encounter (Signed)
i received PA form.  [name] looks to be preferred over onglyza. i am sending that rx, to see if it goes through without PA

## 2015-09-09 NOTE — Telephone Encounter (Signed)
I contacted the pt and advised of note below and voiced understanding.  

## 2015-09-13 NOTE — Telephone Encounter (Signed)
i did pa

## 2015-09-13 NOTE — Telephone Encounter (Signed)
See note below. Would you like to proceed with the PA?

## 2015-09-13 NOTE — Telephone Encounter (Signed)
Patient stated they can't fill the sitaGLIPtin (JANUVIA) 100 MG tablet until they get the PA

## 2015-09-14 NOTE — Telephone Encounter (Signed)
Pa faxed to the pt's insurance. Waiting on the response.

## 2015-09-30 DIAGNOSIS — F4310 Post-traumatic stress disorder, unspecified: Secondary | ICD-10-CM | POA: Diagnosis not present

## 2015-09-30 NOTE — Telephone Encounter (Signed)
See note below and please advise, Thanks! 

## 2015-09-30 NOTE — Telephone Encounter (Signed)
Options are: Stop on a trial basis. Take just 1/2 per day. Change to a different med Please let me know

## 2015-09-30 NOTE — Telephone Encounter (Signed)
Since the start of the new med januvia her stomach has been killing her with constant sharp pain

## 2015-09-30 NOTE — Telephone Encounter (Signed)
ok 

## 2015-09-30 NOTE — Telephone Encounter (Signed)
I contacted the pt and advised of note below. Pt stated she would like to stop the [place] on a trial basis and revaluate at her next office visit.

## 2015-10-01 ENCOUNTER — Ambulatory Visit (INDEPENDENT_AMBULATORY_CARE_PROVIDER_SITE_OTHER): Payer: Medicare HMO | Admitting: Family Medicine

## 2015-10-01 VITALS — BP 116/68 | HR 87 | Temp 98.6°F | Resp 16 | Ht 62.0 in | Wt 158.0 lb

## 2015-10-01 DIAGNOSIS — R102 Pelvic and perineal pain: Secondary | ICD-10-CM | POA: Diagnosis not present

## 2015-10-01 DIAGNOSIS — R1032 Left lower quadrant pain: Secondary | ICD-10-CM | POA: Diagnosis not present

## 2015-10-01 LAB — POCT URINALYSIS DIP (MANUAL ENTRY)
BILIRUBIN UA: NEGATIVE
Blood, UA: NEGATIVE
GLUCOSE UA: NEGATIVE
Ketones, POC UA: NEGATIVE
LEUKOCYTES UA: NEGATIVE
NITRITE UA: NEGATIVE
PROTEIN UA: NEGATIVE
Spec Grav, UA: 1.025
Urobilinogen, UA: 0.2
pH, UA: 5.5

## 2015-10-01 LAB — POCT CBC
Granulocyte percent: 66.6 %G (ref 37–80)
HCT, POC: 44.1 % (ref 37.7–47.9)
Hemoglobin: 15.3 g/dL (ref 12.2–16.2)
LYMPH, POC: 2.8 (ref 0.6–3.4)
MCH, POC: 31.1 pg (ref 27–31.2)
MCHC: 34.7 g/dL (ref 31.8–35.4)
MCV: 89.5 fL (ref 80–97)
MID (CBC): 0.7 (ref 0–0.9)
MPV: 7.4 fL (ref 0–99.8)
POC Granulocyte: 7.1 — AB (ref 2–6.9)
POC LYMPH %: 26.5 % (ref 10–50)
POC MID %: 6.9 % (ref 0–12)
Platelet Count, POC: 196 10*3/uL (ref 142–424)
RBC: 4.93 M/uL (ref 4.04–5.48)
RDW, POC: 13 %
WBC: 10.7 10*3/uL — AB (ref 4.6–10.2)

## 2015-10-01 LAB — POCT WET + KOH PREP: Trich by wet prep: ABSENT

## 2015-10-01 LAB — COMPREHENSIVE METABOLIC PANEL
ALBUMIN: 3.9 g/dL (ref 3.6–5.1)
ALK PHOS: 89 U/L (ref 33–130)
ALT: 4 U/L — AB (ref 6–29)
AST: 12 U/L (ref 10–35)
BUN: 11 mg/dL (ref 7–25)
CO2: 24 mmol/L (ref 20–31)
CREATININE: 0.85 mg/dL (ref 0.50–1.05)
Calcium: 9 mg/dL (ref 8.6–10.4)
Chloride: 105 mmol/L (ref 98–110)
Glucose, Bld: 106 mg/dL — ABNORMAL HIGH (ref 65–99)
POTASSIUM: 4.1 mmol/L (ref 3.5–5.3)
Sodium: 140 mmol/L (ref 135–146)
TOTAL PROTEIN: 6.4 g/dL (ref 6.1–8.1)
Total Bilirubin: 0.5 mg/dL (ref 0.2–1.2)

## 2015-10-01 LAB — IFOBT (OCCULT BLOOD): IFOBT: NEGATIVE

## 2015-10-01 LAB — POC MICROSCOPIC URINALYSIS (UMFC): Mucus: ABSENT

## 2015-10-01 LAB — LIPASE: Lipase: 17 U/L (ref 7–60)

## 2015-10-01 LAB — POCT SEDIMENTATION RATE: POCT SED RATE: 39 mm/h — AB (ref 0–22)

## 2015-10-01 NOTE — Progress Notes (Signed)
By signing my name below I, [name], attest that this documentation has been prepared under the direction and in the presence of [name] Cheadle, MD. Electonically Signed. [name], Scribe 10/01/2015 at 5:04 PM    Subjective:    Patient ID: [name], female    DOB: 1961-04-17, 55 y.o.   MRN: [id number]  Chief Complaint  Patient presents with  . Nausea  . Abdominal Pain  . Coronary Artery Disease    HPI [name] is a 55 y.o. female who presents to the [facility] complaining of nausea and abd pain. Nausea occurred yesterday and resolved on its own. Pt denies any V/D. Abd pain is left sided and is worsened with movement and deep breaths. Pt reports having normal BM. Abd pain is unchanged with BM or eating. Abd pain waxes and wane and never completely resolves and has been going on for the past 2 weeks. Abd pain is mostly cramping with occasional sharp stabbing pains. Pt denies any urinary symptoms. Abd pain is not improved by anything.   Pt denies any vaginal discharge or vaginal bleeding.   Pt also c/o low back pain and states she is seeing a surgeon in 3 days to have it evaluated.   Pt has new dx of DM as of 07/29/15. Pt started taking januvia 3 weeks ago. Pt stopped taking the Januvia because she thought the abd pain was a reaction to the medication.   Pt c/o intermittent numbness in the upper extremities. NSV/EMG found to have C-spine disc impingement.   Pt has had hysterectomy, ovaries are still in place.     Patient Active Problem List   Diagnosis Date Noted  . Diabetes ([place]) 07/29/2015  . 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 Prediabetes 03/17/2014  . Multinodular goiter 09/15/2013  . Hyperthyroidism 07/11/2013  . Carotid bruit   . Depression   . Coronary artery disease   . Hypertension   . Hyperlipidemia   . Neuromuscular disorder ([place])   . Arthritis   . DDD (degenerative disc disease),  lumbar   . Ejection fraction   . Hx of CABG   . Glucose intolerance (pre-diabetes) 02/08/2012  . Encounter for long-term (current) use of other medications 02/08/2012  . Spinal stenosis of lumbar region 02/08/2012  . Neuropathy ([place]) 02/08/2012  . Superficial phlebitis and thrombophlebitis of the leg 04/09/2011  . Back pain, chronic   . TOBACCO ABUSE 11/20/2008    Current Outpatient Prescriptions on File Prior to Visit  Medication Sig Dispense Refill  . ACCU-CHEK AVIVA PLUS test strip Use to check blood sugar 3 times daily. 300 each 2  . albuterol-ipratropium (COMBIVENT) 18-103 MCG/ACT inhaler Inhale 1 puff into the lungs every 6 (six) hours as needed for wheezing or shortness of breath (Shortness of Breath). Reported on 08/06/2015    . aspirin 81 MG tablet Take 81 mg by mouth 2 (two) times daily.     Marland Kitchen buPROPion (WELLBUTRIN SR) 150 MG 12 hr tablet Take 150 mg by mouth every morning.     . carvedilol (COREG) 3.125 MG tablet TAKE 1 TABLET (3.125 MG TOTAL) BY MOUTH 2 (TWO) TIMES DAILY. 60 tablet 2  . diazepam (VALIUM) 5 MG tablet TAKE 1 TABLET BY MOUTH EVERY TWELVE HOURS AS NEEDED FOR MUSCLE SPASM OR SEDATION 30 tablet 0  . DULoxetine (CYMBALTA) 60 MG capsule Take 60 mg by mouth 2 (two) times daily.    . fish oil-omega-3 fatty acids  1000 MG capsule Take 2 g by mouth daily.      Marland Kitchen gabapentin (NEURONTIN) 400 MG capsule Take 1 capsule by mouth 3 (three) times daily.    . Ipratropium-Albuterol (COMBIVENT RESPIMAT) 20-100 MCG/ACT AERS respimat Inhale 1 puff into the lungs every 6 (six) hours. 4 g 1  . levothyroxine (SYNTHROID, LEVOTHROID) 75 MCG tablet TAKE 1 TABLET (75 MCG TOTAL) BY MOUTH DAILY BEFORE BREAKFAST. 90 tablet 2  . metFORMIN (GLUCOPHAGE-XR) 500 MG 24 hr tablet Take 2 tablets (1,000 mg total) by mouth daily with breakfast. 180 tablet 3  . oxyCODONE-acetaminophen (PERCOCET) 7.5-325 MG tablet Take 1 tablet by mouth every 6 (six) hours as needed for severe pain (back pain).    . QUEtiapine  (SEROQUEL) 200 MG tablet Take 200 mg by mouth 3 (three) times daily.    . ranitidine (ZANTAC) 150 MG tablet Take 1 tablet (150 mg total) by mouth 2 (two) times daily. 60 tablet 11  . rosuvastatin (CRESTOR) 20 MG tablet Take 20 mg by mouth daily.    . traZODone (DESYREL) 50 MG tablet Take 50 mg by mouth at bedtime.     No current facility-administered medications on file prior to visit.    Allergies  Allergen Reactions  . Ibuprofen Swelling and Rash  . Prednisone Swelling and Rash  . Cyclobenzaprine Other (See Comments)    unknown  . Morphine Nausea And Vomiting    REACTION: Nausea  . Coconut Flavor Rash  . Coconut Oil Swelling and Rash    Depression screen [facility] 2/9 10/01/2015 08/06/2015 03/13/2014 09/12/2013  Decreased Interest 0 0 0 1  Down, Depressed, Hopeless 0 1 0 2  PHQ - 2 Score 0 1 0 3  Altered sleeping - - - 3  Tired, decreased energy - - - 3  Change in appetite - - - 0  Feeling bad or failure about yourself  - - - 0  Trouble concentrating - - - 2  Moving slowly or fidgety/restless - - - 0  Suicidal thoughts - - - 0  PHQ-9 Score - - - 11        Review of Systems  Constitutional: Negative for fever.  HENT: Negative for congestion.   Eyes: Negative for visual disturbance.  Respiratory: Negative for cough.   Cardiovascular: Negative for chest pain.  Gastrointestinal: Positive for nausea and abdominal pain. Negative for diarrhea.  Genitourinary: Negative for dysuria, vaginal bleeding and vaginal discharge.  Musculoskeletal: Positive for back pain.  Skin: Negative for wound.  Neurological: Negative for headaches.       Objective:  BP 116/68 mmHg  Pulse 87  Temp(Src) 98.6 F (37 C) (Oral)  Resp 16  Ht 5\' 2"  (1.575 m)  Wt 158 lb (71.668 kg)  BMI 28.89 kg/m2  SpO2 93%  Physical Exam  Constitutional: She is oriented to person, place, and time. She appears well-developed and well-nourished. No distress.  HENT:  Head: Normocephalic and atraumatic.  Eyes:  Conjunctivae are normal. Pupils are equal, round, and reactive to light.  Neck: Neck supple.  Cardiovascular: Normal rate, regular rhythm and normal heart sounds.  Exam reveals no gallop and no friction rub.   No murmur heard. Pulmonary/Chest: Effort normal and breath sounds normal. No accessory muscle usage. She has no decreased breath sounds. She has no wheezes. She has no rhonchi. She has no rales.  Pt has minimal tenderness on her left lower ribs.  Abdominal: Normal appearance. Bowel sounds are decreased. There is no hepatosplenomegaly, splenomegaly or hepatomegaly.  There is tenderness in the right lower quadrant and left lower quadrant. There is no rebound, no guarding and no CVA tenderness.  Pt has deferred pain from RLQ to LLQ.  Genitourinary: Vagina normal. There is no rash or tenderness on the right labia. There is no rash or tenderness on the left labia.  Pt has normal perineum. Pt's uterus absent due to hysterectomy. Severe left adnexa tenderness with fullness, no discrete mass. No rt adnexa or cervical tenderness. Rectal exam norma with no mass or hemorrhoids. Soft stool in vault.   Musculoskeletal: Normal range of motion.  Neurological: She is alert and oriented to person, place, and time. Gait normal.  Skin: Skin is warm and dry.  Psychiatric: She has a normal mood and affect. Her behavior is normal.  Nursing note and vitals reviewed.  Results for orders placed or performed in visit on 10/01/15  POCT CBC  Result Value Ref Range   WBC 10.7 (A) 4.6 - 10.2 K/uL   Lymph, poc 2.8 0.6 - 3.4   POC LYMPH PERCENT 26.5 10 - 50 %L   MID (cbc) 0.7 0 - 0.9   POC MID % 6.9 0 - 12 %M   POC Granulocyte 7.1 (A) 2 - 6.9   Granulocyte percent 66.6 37 - 80 %G   RBC 4.93 4.04 - 5.48 M/uL   Hemoglobin 15.3 12.2 - 16.2 g/dL   HCT, POC 44.1 37.7 - 47.9 %   MCV 89.5 80 - 97 fL   MCH, POC 31.1 27 - 31.2 pg   MCHC 34.7 31.8 - 35.4 g/dL   RDW, POC 13.0 %   Platelet Count, POC 196 142 - 424 K/uL     MPV 7.4 0 - 99.8 fL  POCT urinalysis dipstick  Result Value Ref Range   Color, UA yellow yellow   Clarity, UA clear clear   Glucose, UA negative negative   Bilirubin, UA negative negative   Ketones, POC UA negative negative   Spec Grav, UA 1.025    Blood, UA negative negative   pH, UA 5.5    Protein Ur, POC negative negative   Urobilinogen, UA 0.2    Nitrite, UA Negative Negative   Leukocytes, UA Negative Negative  POCT Microscopic Urinalysis ([facility])  Result Value Ref Range   WBC,UR,HPF,POC None None WBC/hpf   RBC,UR,HPF,POC None None RBC/hpf   Bacteria Few (A) None, Too numerous to count   Mucus Absent Absent   Epithelial Cells, UR Per Microscopy Few (A) None, Too numerous to count cells/hpf  POCT SEDIMENTATION RATE  Result Value Ref Range   POCT SED RATE 39 (A) 0 - 22 mm/hr  POCT Wet + KOH Prep  Result Value Ref Range   Yeast by KOH Present Present, Absent   Yeast by wet prep Present Present, Absent   WBC by wet prep Few None, Few, Too numerous to count   Clue Cells Wet Prep HPF POC None None, Too numerous to count   Trich by wet prep Absent Present, Absent   Bacteria Wet Prep HPF POC Many (A) None, Few, Too numerous to count   Epithelial Cells By [employer] ([facility]) Many (A) None, Few, Too numerous to count   RBC,UR,HPF,POC None None RBC/hpf  IFOBT POC (occult bld, rslt in office)  Result Value Ref Range   IFOBT Negative           Assessment & Plan:   1. Abdominal pain, left lower quadrant - seems more adnexal on exam  2. Pelvic pain in female - - unknown etiology so arrange for pelvic US for tomorrow    Orders Placed This Encounter  Procedures  . US Transvaginal Non-OB    Standing Status: Future     Number of Occurrences: 1     Standing Expiration Date: 11/30/2016    Order Specific Question:  Reason for Exam (SYMPTOM  OR DIAGNOSIS REQUIRED)    Answer:  severe left adenexal pain    Order Specific Question:  Preferred imaging location?    Answer:  External  .  US Pelvis Complete    Standing Status: Future     Number of Occurrences: 1     Standing Expiration Date: 11/30/2016    Order Specific Question:  Reason for Exam (SYMPTOM  OR DIAGNOSIS REQUIRED)    Answer:  severe left adnexal pain    Order Specific Question:  Preferred imaging location?    Answer:  External  . Comprehensive metabolic panel  . Lipase  . POCT CBC  . POCT urinalysis dipstick  . POCT Microscopic Urinalysis ([facility])  . POCT SEDIMENTATION RATE  . POCT Wet + KOH Prep  . IFOBT POC (occult bld, rslt in office)    Meds ordered this encounter  Medications  . sitaGLIPtin (JANUVIA) 50 MG tablet    Sig: Take 50 mg by mouth daily.    I personally performed the services described in this documentation, which was scribed in my presence. The recorded information has been reviewed and considered, and addended by me as needed.   [name], M.D.  [facility] [address] [place], [place] [address] [phone] phone [phone] fax  10/05/2015 10:25 PM

## 2015-10-01 NOTE — Patient Instructions (Addendum)
Your ultrasound has been scheduled for 10/02/2015 (tomorrow) at 230pm. The address is [address], [place], [place] [address] [address] Please go in through the main entrance. They ask that you arrive with a full bladder, so please drink 32oz of water around 130pm and hold it in until after your exam.       IF you received an x-ray today, you will receive an invoice from [facility]. Please contact [facility] at [phone] with questions or concerns regarding your invoice.   IF you received labwork today, you will receive an invoice from [employer]. Please contact [facility] at [phone] with questions or concerns regarding your invoice.   Our billing staff will not be able to assist you with questions regarding bills from these companies.  You will be contacted with the lab results as soon as they are available. The fastest way to get your results is to activate your My Chart account. Instructions are located on the last page of this paperwork. If you have not heard from [place] regarding the results in 2 weeks, please contact this office.

## 2015-10-02 ENCOUNTER — Ambulatory Visit (HOSPITAL_BASED_OUTPATIENT_CLINIC_OR_DEPARTMENT_OTHER)
Admission: RE | Admit: 2015-10-02 | Discharge: 2015-10-02 | Disposition: A | Discharge status: Home / Self Care | Payer: Medicare HMO | Source: Ambulatory Visit | Attending: Family Medicine | Admitting: Family Medicine

## 2015-10-02 DIAGNOSIS — R1032 Left lower quadrant pain: Secondary | ICD-10-CM | POA: Insufficient documentation

## 2015-10-02 DIAGNOSIS — R102 Pelvic and perineal pain: Secondary | ICD-10-CM

## 2015-10-04 DIAGNOSIS — M5416 Radiculopathy, lumbar region: Secondary | ICD-10-CM | POA: Diagnosis not present

## 2015-10-04 NOTE — Telephone Encounter (Signed)
Pt had to go to [facility] MD it was found that she has an issue with her ovary. That was the cause of the pain, she has started herself back on the [place]

## 2015-10-04 NOTE — Telephone Encounter (Signed)
See note below to be advised. 

## 2015-10-05 ENCOUNTER — Telehealth: Payer: Self-pay

## 2015-10-05 NOTE — Telephone Encounter (Signed)
Notes Recorded by Shawnee Knapp, MD on 10/05/2015 at 10:55 AM [name] -  The ultrasound was normal - and apparently your ovaries have shrunk away! You do have a fair amount of gas which might be causing your symptoms. Harmon Pier

## 2015-10-05 NOTE — Telephone Encounter (Signed)
Pt.notified

## 2015-10-05 NOTE — Telephone Encounter (Signed)
Pt is looking for her ultrasound results  Best number [phone]

## 2015-10-06 DIAGNOSIS — G5601 Carpal tunnel syndrome, right upper limb: Secondary | ICD-10-CM | POA: Diagnosis not present

## 2015-10-06 DIAGNOSIS — G5622 Lesion of ulnar nerve, left upper limb: Secondary | ICD-10-CM | POA: Diagnosis not present

## 2015-10-06 DIAGNOSIS — G5602 Carpal tunnel syndrome, left upper limb: Secondary | ICD-10-CM | POA: Diagnosis not present

## 2015-10-07 ENCOUNTER — Telehealth: Payer: Self-pay | Admitting: Cardiology

## 2015-10-07 ENCOUNTER — Other Ambulatory Visit: Payer: Self-pay | Admitting: Orthopedic Surgery

## 2015-10-07 NOTE — Telephone Encounter (Deleted)
error 

## 2015-10-12 ENCOUNTER — Ambulatory Visit (INDEPENDENT_AMBULATORY_CARE_PROVIDER_SITE_OTHER): Payer: Medicare HMO | Admitting: Cardiology

## 2015-10-12 ENCOUNTER — Encounter: Payer: Self-pay | Admitting: Cardiology

## 2015-10-12 VITALS — BP 110/68 | HR 78 | Ht 62.0 in | Wt 157.8 lb

## 2015-10-12 DIAGNOSIS — Z01810 Encounter for preprocedural cardiovascular examination: Secondary | ICD-10-CM | POA: Diagnosis not present

## 2015-10-12 NOTE — Progress Notes (Signed)
Electrophysiology Office Note   Date:  10/12/2015   ID:  [name], DOB April 20, 1961, MRN [id number]  PCP:  Bartholome Bill, MD  Cardiologist:  Gus Rankin Electrophysiologist:  Taleigh Gero Meredith Leeds, MD    Chief Complaint  Patient presents with  . New Patient (Initial Visit)     History of Present Illness: [name] is a 55 y.o. female who presents today for electrophysiology evaluation.   She has a history of CAD.  Had CABG 2008 with a  Nuclear stress test in 2014 showing no ischemia.  LVEF was good at that time.  She has been feeling well since her last cardiology visit. She has no chest pain, shortness of breath, PND, or orthopnea. She says that he feels like she would be able to climb a flight of stairs without issue if it were not for her back. She is able to walk on flat ground without issues as well. She says that after she eats dinner, she does with him all or to [facility] with her husband to walk around and is not limited until her legs aren't bothering her.  Today, she denies symptoms of palpitations, chest pain, shortness of breath, orthopnea, PND, lower extremity edema, claudication, dizziness, presyncope, syncope, bleeding, or neurologic sequela. The patient is tolerating medications without difficulties and is otherwise without complaint today.    Past Medical History  Diagnosis Date  . Back pain, chronic   . Depression   . Myocardial infarction ([place]) 11/20/06  . Hypertension   . Hypercholesteremia   . Coronary artery disease   . Allergy   . Arthritis   . Neuromuscular disorder ([place])   . Bronchitis     hx-no inhalers at home  . Diabetes mellitus     diet controlled  . DDD (degenerative disc disease), lumbar   . Ejection fraction     .  Marland Kitchen Hx of CABG   . Carotid bruit   . Nodule of neck   . Multiple thyroid nodules    Past Surgical History  Procedure Laterality Date  . Mandible surgery  2008  . Knee surgery  2009    lt   .  Tubal ligation    . Breast surgery      rt br cystx2  . Abdominal hysterectomy  1984  . Coronary artery bypass graft  2008  . Orif ankle fracture Right 07/12/2012    Procedure: OPEN REDUCTION INTERNAL FIXATION (ORIF) RIGHT ANKLE FRACTURE;  Surgeon: Alta Corning, MD;  Location: [facility];  Service: Orthopedics;  Laterality: Right;     Current Outpatient Prescriptions  Medication Sig Dispense Refill  . ACCU-CHEK AVIVA PLUS test strip Use to check blood sugar 3 times daily. 300 each 2  . albuterol-ipratropium (COMBIVENT) 18-103 MCG/ACT inhaler Inhale 1 puff into the lungs every 6 (six) hours as needed for wheezing or shortness of breath (Shortness of Breath). Reported on 08/06/2015    . aspirin 81 MG tablet Take 81 mg by mouth 2 (two) times daily.     Marland Kitchen buPROPion (WELLBUTRIN SR) 150 MG 12 hr tablet Take 150 mg by mouth every morning.     . carvedilol (COREG) 3.125 MG tablet TAKE 1 TABLET (3.125 MG TOTAL) BY MOUTH 2 (TWO) TIMES DAILY. 60 tablet 2  . diazepam (VALIUM) 5 MG tablet TAKE 1 TABLET BY MOUTH EVERY TWELVE HOURS AS NEEDED FOR MUSCLE SPASM OR SEDATION 30 tablet 0  . DULoxetine (CYMBALTA) 60 MG capsule Take 60  mg by mouth 2 (two) times daily.    . fish oil-omega-3 fatty acids 1000 MG capsule Take 2 g by mouth daily.      Marland Kitchen gabapentin (NEURONTIN) 400 MG capsule Take 1 capsule by mouth 3 (three) times daily.    . Ipratropium-Albuterol (COMBIVENT RESPIMAT) 20-100 MCG/ACT AERS respimat Inhale 1 puff into the lungs every 6 (six) hours. 4 g 1  . levothyroxine (SYNTHROID, LEVOTHROID) 75 MCG tablet TAKE 1 TABLET (75 MCG TOTAL) BY MOUTH DAILY BEFORE BREAKFAST. 90 tablet 2  . metFORMIN (GLUCOPHAGE-XR) 500 MG 24 hr tablet Take 2 tablets (1,000 mg total) by mouth daily with breakfast. 180 tablet 3  . oxyCODONE-acetaminophen (PERCOCET) 7.5-325 MG tablet Take 1 tablet by mouth every 6 (six) hours as needed for severe pain (back pain).    . QUEtiapine (SEROQUEL) 200 MG tablet Take 200 mg by  mouth 3 (three) times daily.    . ranitidine (ZANTAC) 150 MG tablet Take 1 tablet (150 mg total) by mouth 2 (two) times daily. 60 tablet 11  . rosuvastatin (CRESTOR) 20 MG tablet Take 20 mg by mouth daily.    . sitaGLIPtin (JANUVIA) 50 MG tablet Take 50 mg by mouth daily.    . traZODone (DESYREL) 50 MG tablet Take 50 mg by mouth at bedtime.     No current facility-administered medications for this visit.    Allergies:   Ibuprofen; Prednisone; Cyclobenzaprine; Morphine; Coconut flavor; and Coconut oil   Social History:  The patient  reports that she has been smoking Cigarettes.  She has a 14.5 pack-year smoking history. She has never used smokeless tobacco. She reports that she does not drink alcohol or use illicit drugs.   Family History:  The patient's family history includes Cancer in her daughter, maternal aunt, maternal grandfather, and paternal aunt; Heart disease in her father and mother; Hyperlipidemia in her father and mother; Hypertension in her brother, father, maternal grandfather, maternal grandmother, mother, paternal grandfather, paternal grandmother, and sister; Stroke in her father. There is no history of Colon cancer.    ROS:  Please see the history of present illness.   Otherwise, review of systems is positive for appetite change, constipation, depression, anxiety, back pain, balance problems.   All other systems are reviewed and negative.    PHYSICAL EXAM: VS:  BP 110/68 mmHg  Pulse 78  Ht 5\' 2"  (1.575 m)  Wt 157 lb 12.8 oz (71.578 kg)  BMI 28.85 kg/m2 , BMI Body mass index is 28.85 kg/(m^2). GEN: Well nourished, well developed, in no acute distress HEENT: normal Neck: no JVD, carotid bruits, or masses Cardiac: RRR; no murmurs, rubs, or gallops,no edema  Respiratory:  clear to auscultation bilaterally, normal work of breathing GI: soft, nontender, nondistended, + BS MS: no deformity or atrophy Skin: warm and dry Neuro:  Strength and sensation are intact Psych:  euthymic mood, full affect  EKG:  EKG is ordered today. The ekg ordered today shows sinus rhythm, rate 78, inferior Q waves and a a he is a  Recent Labs: 07/29/2015: TSH 1.98 10/01/2015: ALT 4*; BUN 11; Creat 0.85; Hemoglobin 15.3; Potassium 4.1; Sodium 140    Lipid Panel     Component Value Date/Time   CHOL 129 03/13/2014 1526   TRIG 171* 03/13/2014 1526   HDL 34* 03/13/2014 1526   CHOLHDL 3.8 03/13/2014 1526   VLDL 34 03/13/2014 1526   LDLCALC 61 03/13/2014 1526     Wt Readings from Last 3 Encounters:  10/12/15 157 lb  12.8 oz (71.578 kg)  10/01/15 158 lb (71.668 kg)  08/06/15 164 lb (74.39 kg)      Other studies Reviewed: Additional studies/ records that were reviewed today include: Epic notes  ASSESSMENT AND PLAN:  1.  CAD: Currently no symptoms of coronary disease. She is able to exercise without issue. We'll continue to monitor without any further testing at this time.  2. Hyperlipidemia: On Crestor.  3. Preoperative evaluation: Currently not having any symptoms that are cardiac in nature. She is able to exert herself without chest pain or shortness of breath. She would therefore be at low to intermediate risk for an intermediate risk procedure. We'll continue her beta blocker, aspirin, and statin through the periprocedural time.    Current medicines are reviewed at length with the patient today.   The patient does not have concerns regarding her medicines.  The following changes were made today:  none  Labs/ tests ordered today include:  No orders of the defined types were placed in this encounter.     Disposition:   FU with [facility] 4 months  Signed, Hollynn Garno Meredith Leeds, MD  10/12/2015 2:57 PM     [facility] [place] [address] [phone] (office) [phone] (fax)

## 2015-10-12 NOTE — Patient Instructions (Signed)
Medication Instructions:  Your physician recommends that you continue on your current medications as directed. Please refer to the Current Medication list given to you today.  Labwork: None ordered  Testing/Procedures: None ordered  Follow-Up: Keep follow up in September 2017 with general cardiologist.  If you need a refill on your cardiac medications before your next appointment, please call your pharmacy.  Thank you for choosing [facility]!!   Trinidad Curet, RN [phone]

## 2015-10-14 ENCOUNTER — Telehealth: Payer: Self-pay | Admitting: Endocrinology

## 2015-10-14 MED ORDER — LEVOTHYROXINE SODIUM 75 MCG PO TABS: ORAL_TABLET | ORAL | Status: DC

## 2015-10-14 NOTE — Telephone Encounter (Signed)
Patient stated that pharmacist stated she was not a patient here, pleas advise she need a refill of  Levothyroxine, send to [place], [place] [facility] [place] [phone] (Phone) [phone] (Fax)

## 2015-10-14 NOTE — Telephone Encounter (Signed)
Rx resubmitted

## 2015-10-19 ENCOUNTER — Encounter (HOSPITAL_COMMUNITY): Payer: Self-pay

## 2015-10-19 ENCOUNTER — Ambulatory Visit (HOSPITAL_COMMUNITY)
Admission: RE | Admit: 2015-10-19 | Discharge: 2015-10-19 | Disposition: A | Discharge status: Home / Self Care | Payer: Commercial Managed Care - HMO | Source: Ambulatory Visit | Attending: Orthopedic Surgery | Admitting: Orthopedic Surgery

## 2015-10-19 ENCOUNTER — Encounter (HOSPITAL_COMMUNITY)
Admission: RE | Admit: 2015-10-19 | Discharge: 2015-10-19 | Disposition: A | Discharge status: Home / Self Care | Payer: Commercial Managed Care - HMO | Source: Ambulatory Visit | Attending: Orthopedic Surgery | Admitting: Orthopedic Surgery

## 2015-10-19 DIAGNOSIS — Z885 Allergy status to narcotic agent status: Secondary | ICD-10-CM | POA: Diagnosis not present

## 2015-10-19 DIAGNOSIS — E119 Type 2 diabetes mellitus without complications: Secondary | ICD-10-CM | POA: Diagnosis not present

## 2015-10-19 DIAGNOSIS — E0590 Thyrotoxicosis, unspecified without thyrotoxic crisis or storm: Secondary | ICD-10-CM | POA: Diagnosis present

## 2015-10-19 DIAGNOSIS — M4806 Spinal stenosis, lumbar region: Secondary | ICD-10-CM | POA: Diagnosis present

## 2015-10-19 DIAGNOSIS — F17210 Nicotine dependence, cigarettes, uncomplicated: Secondary | ICD-10-CM | POA: Diagnosis present

## 2015-10-19 DIAGNOSIS — M5416 Radiculopathy, lumbar region: Secondary | ICD-10-CM | POA: Diagnosis not present

## 2015-10-19 DIAGNOSIS — R5383 Other fatigue: Secondary | ICD-10-CM | POA: Diagnosis present

## 2015-10-19 DIAGNOSIS — E1151 Type 2 diabetes mellitus with diabetic peripheral angiopathy without gangrene: Secondary | ICD-10-CM | POA: Diagnosis present

## 2015-10-19 DIAGNOSIS — F319 Bipolar disorder, unspecified: Secondary | ICD-10-CM | POA: Diagnosis present

## 2015-10-19 DIAGNOSIS — R339 Retention of urine, unspecified: Secondary | ICD-10-CM | POA: Diagnosis present

## 2015-10-19 DIAGNOSIS — M4726 Other spondylosis with radiculopathy, lumbar region: Secondary | ICD-10-CM | POA: Diagnosis present

## 2015-10-19 DIAGNOSIS — Z01812 Encounter for preprocedural laboratory examination: Secondary | ICD-10-CM

## 2015-10-19 DIAGNOSIS — E7800 Pure hypercholesterolemia, unspecified: Secondary | ICD-10-CM | POA: Diagnosis present

## 2015-10-19 DIAGNOSIS — Z951 Presence of aortocoronary bypass graft: Secondary | ICD-10-CM | POA: Diagnosis not present

## 2015-10-19 DIAGNOSIS — I10 Essential (primary) hypertension: Secondary | ICD-10-CM | POA: Diagnosis present

## 2015-10-19 DIAGNOSIS — Z981 Arthrodesis status: Secondary | ICD-10-CM | POA: Diagnosis not present

## 2015-10-19 DIAGNOSIS — K219 Gastro-esophageal reflux disease without esophagitis: Secondary | ICD-10-CM | POA: Diagnosis present

## 2015-10-19 DIAGNOSIS — T43595A Adverse effect of other antipsychotics and neuroleptics, initial encounter: Secondary | ICD-10-CM | POA: Diagnosis not present

## 2015-10-19 DIAGNOSIS — M4316 Spondylolisthesis, lumbar region: Secondary | ICD-10-CM | POA: Diagnosis present

## 2015-10-19 DIAGNOSIS — M549 Dorsalgia, unspecified: Secondary | ICD-10-CM | POA: Diagnosis not present

## 2015-10-19 DIAGNOSIS — Z01818 Encounter for other preprocedural examination: Secondary | ICD-10-CM | POA: Insufficient documentation

## 2015-10-19 DIAGNOSIS — Z7984 Long term (current) use of oral hypoglycemic drugs: Secondary | ICD-10-CM | POA: Diagnosis not present

## 2015-10-19 DIAGNOSIS — Z888 Allergy status to other drugs, medicaments and biological substances status: Secondary | ICD-10-CM | POA: Diagnosis not present

## 2015-10-19 DIAGNOSIS — Z886 Allergy status to analgesic agent status: Secondary | ICD-10-CM | POA: Diagnosis not present

## 2015-10-19 DIAGNOSIS — M79604 Pain in right leg: Secondary | ICD-10-CM | POA: Diagnosis present

## 2015-10-19 DIAGNOSIS — R918 Other nonspecific abnormal finding of lung field: Secondary | ICD-10-CM | POA: Diagnosis not present

## 2015-10-19 DIAGNOSIS — M1990 Unspecified osteoarthritis, unspecified site: Secondary | ICD-10-CM | POA: Diagnosis not present

## 2015-10-19 DIAGNOSIS — R509 Fever, unspecified: Secondary | ICD-10-CM | POA: Diagnosis not present

## 2015-10-19 DIAGNOSIS — R410 Disorientation, unspecified: Secondary | ICD-10-CM | POA: Diagnosis not present

## 2015-10-19 DIAGNOSIS — G9341 Metabolic encephalopathy: Secondary | ICD-10-CM | POA: Diagnosis not present

## 2015-10-19 DIAGNOSIS — I2510 Atherosclerotic heart disease of native coronary artery without angina pectoris: Secondary | ICD-10-CM | POA: Diagnosis present

## 2015-10-19 DIAGNOSIS — Z7982 Long term (current) use of aspirin: Secondary | ICD-10-CM | POA: Diagnosis not present

## 2015-10-19 DIAGNOSIS — Z9102 Food additives allergy status: Secondary | ICD-10-CM | POA: Diagnosis not present

## 2015-10-19 DIAGNOSIS — I252 Old myocardial infarction: Secondary | ICD-10-CM | POA: Diagnosis not present

## 2015-10-19 DIAGNOSIS — F329 Major depressive disorder, single episode, unspecified: Secondary | ICD-10-CM | POA: Diagnosis not present

## 2015-10-19 HISTORY — DX: Gastro-esophageal reflux disease without esophagitis: K21.9

## 2015-10-19 HISTORY — DX: Carpal tunnel syndrome, bilateral upper limbs: G56.03

## 2015-10-19 HISTORY — DX: Anxiety disorder, unspecified: F41.9

## 2015-10-19 HISTORY — DX: Hypothyroidism, unspecified: E03.9

## 2015-10-19 HISTORY — DX: Bipolar disorder, unspecified: F31.9

## 2015-10-19 LAB — TYPE AND SCREEN
ABO/RH(D): O POS
ANTIBODY SCREEN: NEGATIVE

## 2015-10-19 LAB — URINALYSIS, ROUTINE W REFLEX MICROSCOPIC
Bilirubin Urine: NEGATIVE
Glucose, UA: NEGATIVE mg/dL
Hgb urine dipstick: NEGATIVE
Ketones, ur: NEGATIVE mg/dL
Leukocytes, UA: NEGATIVE
NITRITE: NEGATIVE
PROTEIN: NEGATIVE mg/dL
SPECIFIC GRAVITY, URINE: 1.007 (ref 1.005–1.030)
pH: 6 (ref 5.0–8.0)

## 2015-10-19 LAB — COMPREHENSIVE METABOLIC PANEL
ALBUMIN: 3.7 g/dL (ref 3.5–5.0)
ALK PHOS: 91 U/L (ref 38–126)
ALT: 8 U/L — AB (ref 14–54)
ANION GAP: 6 (ref 5–15)
AST: 14 U/L — ABNORMAL LOW (ref 15–41)
BUN: 7 mg/dL (ref 6–20)
CHLORIDE: 109 mmol/L (ref 101–111)
CO2: 25 mmol/L (ref 22–32)
Calcium: 9.3 mg/dL (ref 8.9–10.3)
Creatinine, Ser: 0.8 mg/dL (ref 0.44–1.00)
GFR calc Af Amer: >60 mL/min (ref >60)
GFR calc non Af Amer: >60 mL/min (ref >60)
GLUCOSE: 121 mg/dL — AB (ref 65–99)
POTASSIUM: 4.1 mmol/L (ref 3.5–5.1)
SODIUM: 140 mmol/L (ref 135–145)
Total Bilirubin: 0.4 mg/dL (ref 0.3–1.2)
Total Protein: 6.6 g/dL (ref 6.5–8.1)

## 2015-10-19 LAB — PROTIME-INR
INR: 0.93 (ref 0.00–1.49)
Prothrombin Time: 12.7 seconds (ref 11.6–15.2)

## 2015-10-19 LAB — CBC WITH DIFFERENTIAL/PLATELET
BASOS PCT: 1 %
Basophils Absolute: 0.1 10*3/uL (ref 0.0–0.1)
EOS ABS: 0.2 10*3/uL (ref 0.0–0.7)
Eosinophils Relative: 2 %
HCT: 46 % (ref 36.0–46.0)
HEMOGLOBIN: 15 g/dL (ref 12.0–15.0)
LYMPHS ABS: 2.9 10*3/uL (ref 0.7–4.0)
Lymphocytes Relative: 33 %
MCH: 29.9 pg (ref 26.0–34.0)
MCHC: 32.6 g/dL (ref 30.0–36.0)
MCV: 91.6 fL (ref 78.0–100.0)
Monocytes Absolute: 0.6 10*3/uL (ref 0.1–1.0)
Monocytes Relative: 7 %
NEUTROS PCT: 57 %
Neutro Abs: 4.8 10*3/uL (ref 1.7–7.7)
Platelets: 182 10*3/uL (ref 150–400)
RBC: 5.02 MIL/uL (ref 3.87–5.11)
RDW: 13.6 % (ref 11.5–15.5)
WBC: 8.6 10*3/uL (ref 4.0–10.5)

## 2015-10-19 LAB — GLUCOSE, CAPILLARY: GLUCOSE-CAPILLARY: 153 mg/dL — AB (ref 65–99)

## 2015-10-19 LAB — APTT: aPTT: 32 seconds (ref 24–37)

## 2015-10-19 LAB — SURGICAL PCR SCREEN
MRSA, PCR: NEGATIVE
Staphylococcus aureus: NEGATIVE

## 2015-10-19 LAB — ABO/RH: ABO/RH(D): O POS

## 2015-10-19 NOTE — Pre-Procedure Instructions (Signed)
[name]  10/19/2015      [place], [address] [place], [place] [address] Phone: [phone] Fax: [phone]  [facility] [place], [place] [address] Phone: [phone] Fax: [phone]    Your procedure is scheduled on 10-21-2015  Thursday  .  Report to [facility] Admitting at 9:17 A.M.   Call this number if you have problems the morning of surgery:  [phone]   Remember:  Do not eat food or drink liquids after midnight.   Take these medicines the morning of surgery with A SIP OF WATER Bupropion(Wellbutrin),carvedilol(Coreg),diazepan(Valium) if needed,Duloxetine(Cymbalta),gabapentin(Neurotin),Combivent Inhaler if needed,levothyroxine(Synthroid),pain medication if needed             How to Manage Your Diabetes Before and After Surgery  Why is it important to control my blood sugar before and after surgery? . Improving blood sugar levels before and after surgery helps healing and can limit problems. . A way of improving blood sugar control is eating a healthy diet by: o  Eating less sugar and carbohydrates o  Increasing activity/exercise o  Talking with your doctor about reaching your blood sugar goals . High blood sugars (greater than 180 mg/dL) can raise your risk of infections and slow your recovery, so you will need to focus on controlling your diabetes during the weeks before surgery. . Make sure that the doctor who takes care of your diabetes knows about your planned surgery including the date and location.  How do I manage my blood sugar before surgery? . Check your blood sugar at least 4 times a day, starting 2 days before surgery, to make sure that the level is not too high or low. o Check your blood sugar the morning of your surgery when you wake up and every 2 hours until you get to the Short Stay  unit. . If your blood sugar is less than 70 mg/dL, you will need to treat for low blood sugar: o Do not take insulin. o Treat a low blood sugar (less than 70 mg/dL) with  cup of clear juice (cranberry or apple), 4 glucose tablets, OR glucose gel. o Recheck blood sugar in 15 minutes after treatment (to make sure it is greater than 70 mg/dL). If your blood sugar is not greater than 70 mg/dL on recheck, call [phone] for further instructions. . Report your blood sugar to the short stay nurse when you get to Short Stay.  . If you are admitted to the hospital after surgery: o Your blood sugar will be checked by the staff and you will probably be given insulin after surgery (instead of oral diabetes medicines) to make sure you have good blood sugar levels. o The goal for blood sugar control after surgery is 80-180 mg/dL.              WHAT DO I DO ABOUT MY DIABETES MEDICATION?   Marland Kitchen Do not take oral diabetes medicines (pills) the morning of surgery.      Reviewed and Endorsed by [facility] Patient Education Committee, August 2015    Do not wear jewelry, make-up or nail polish.  Do not wear lotions, powders, or perfumes.  You may not wear deoderant.  Do not shave 48 hours prior to surgery.     Do not bring valuables to the hospital.  [facility] is not  responsible for any belongings or valuables.  Contacts, dentures or bridgework may not be worn into surgery.  Leave your suitcase in the car.  After surgery it may be brought to your room.  For patients admitted to the hospital, discharge time will be determined by your treatment team.  Patients discharged the day of surgery will not be allowed to drive home.    Special instructions:  See attached instructions for CHG showers  Please read over the following fact sheets that you were given. MRSA Information and Surgical Site Infection Prevention

## 2015-10-19 NOTE — H&P (Signed)
PREOPERATIVE H&P  Chief Complaint: Right leg pain  HPI: [name] is a 55 y.o. female who presents with ongoing pain in the right leg  MRI reveals severe NF stenosis on the right at L4/5 with an L4/5 spondylolisthesis noted on xrays  Patient has failed multiple forms of conservative care and continues to have pain (see office notes for additional details regarding the patient's full course of treatment)  Past Medical History  Diagnosis Date  . Back pain, chronic   . Depression   . Myocardial infarction ([place]) 11/20/06  . Hypertension   . Hypercholesteremia   . Coronary artery disease   . Allergy   . Arthritis   . Neuromuscular disorder ([place])   . Bronchitis     hx-no inhalers at home  . Diabetes mellitus     diet controlled  . DDD (degenerative disc disease), lumbar   . Ejection fraction     .  Marland Kitchen Hx of CABG   . Carotid bruit   . Nodule of neck   . Multiple thyroid nodules   . Hypothyroidism   . Anxiety   . GERD (gastroesophageal reflux disease)   . Bipolar disorder ([place])   . Carpal tunnel syndrome on both sides    Past Surgical History  Procedure Laterality Date  . Knee surgery  2009    lt   . Tubal ligation    . Breast surgery      rt br cystx2  . Abdominal hysterectomy  1984  . Coronary artery bypass graft  2008  . Orif ankle fracture Right 07/12/2012    Procedure: OPEN REDUCTION INTERNAL FIXATION (ORIF) RIGHT ANKLE FRACTURE;  Surgeon: Alta Corning, MD;  Location: [place];  Service: Orthopedics;  Laterality: Right;  . Wrist surgery Left    Social History   Social History  . Marital Status: Married    Spouse Name: N/A  . Number of Children: N/A  . Years of Education: N/A   Social History Main Topics  . Smoking status: Current Every Day Smoker -- 0.25 packs/day for 29 years    Types: Cigarettes  . Smokeless tobacco: Never Used  . Alcohol Use: No  . Drug Use: No  . Sexual Activity: Not on file   Other Topics  Concern  . Not on file   Social History Narrative   Family History  Problem Relation Age of Onset  . Heart disease Mother   . Hyperlipidemia Mother   . Hypertension Mother   . Heart disease Father   . Stroke Father   . Hypertension Father   . Hyperlipidemia Father   . Hypertension Sister   . Hypertension Brother   . Cancer Daughter     unknown  . Hypertension Maternal Grandmother   . Hypertension Maternal Grandfather   . Cancer Maternal Grandfather   . Hypertension Paternal Grandmother   . Hypertension Paternal Grandfather   . Cancer Maternal Aunt     breast and ovarian  . Cancer Paternal Aunt     breast  . Colon cancer Neg Hx    Allergies  Allergen Reactions  . Ibuprofen Swelling and Rash  . Prednisone Swelling and Rash  . Cyclobenzaprine Other (See Comments)    Reaction unknown  . Morphine Nausea And Vomiting  . Coconut Flavor Swelling and Rash   Prior to Admission medications   Medication Sig Start Date End Date Taking? Authorizing Provider  ACCU-CHEK AVIVA PLUS test strip Use to check  blood sugar 3 times daily. 08/30/15  Yes Renato Shin, MD  aspirin EC 81 MG tablet Take 81 mg by mouth 2 (two) times daily.   Yes Historical Provider, MD  buPROPion (WELLBUTRIN SR) 150 MG 12 hr tablet Take 150 mg by mouth every morning.    Yes Historical Provider, MD  carvedilol (COREG) 3.125 MG tablet TAKE 1 TABLET (3.125 MG TOTAL) BY MOUTH 2 (TWO) TIMES DAILY. 08/05/14  Yes Shawnee Knapp, MD  diazepam (VALIUM) 5 MG tablet TAKE 1 TABLET BY MOUTH EVERY TWELVE HOURS AS NEEDED FOR MUSCLE SPASM OR SEDATION 01/03/15  Yes Shawnee Knapp, MD  DULoxetine (CYMBALTA) 60 MG capsule Take 60 mg by mouth 2 (two) times daily.   Yes Historical Provider, MD  fish oil-omega-3 fatty acids 1000 MG capsule Take 2 g by mouth daily.     Yes Historical Provider, MD  gabapentin (NEURONTIN) 400 MG capsule Take 400 mg by mouth 3 (three) times daily.  01/01/15  Yes Historical Provider, MD  Ipratropium-Albuterol (COMBIVENT  RESPIMAT) 20-100 MCG/ACT AERS respimat Inhale 1 puff into the lungs every 6 (six) hours. Patient taking differently: Inhale 1 puff into the lungs every 6 (six) hours as needed for wheezing or shortness of breath.  12/16/13  Yes Shawnee Knapp, MD  levothyroxine (SYNTHROID, LEVOTHROID) 75 MCG tablet TAKE 1 TABLET (75 MCG TOTAL) BY MOUTH DAILY BEFORE BREAKFAST. Patient taking differently: Take 75 mcg by mouth daily before breakfast.  10/14/15  Yes Renato Shin, MD  metFORMIN (GLUCOPHAGE-XR) 500 MG 24 hr tablet Take 2 tablets (1,000 mg total) by mouth daily with breakfast. 07/29/15  Yes Renato Shin, MD  oxyCODONE-acetaminophen (PERCOCET) 10-325 MG tablet Take 1 tablet by mouth every 6 (six) hours as needed for pain.   Yes Historical Provider, MD  QUEtiapine (SEROQUEL) 200 MG tablet Take 200 mg by mouth 3 (three) times daily.   Yes Historical Provider, MD  ranitidine (ZANTAC) 150 MG tablet Take 1 tablet (150 mg total) by mouth 2 (two) times daily. 07/24/14  Yes Shawnee Knapp, MD  rosuvastatin (CRESTOR) 20 MG tablet Take 20 mg by mouth daily.   Yes Historical Provider, MD  sitaGLIPtin (JANUVIA) 50 MG tablet Take 50 mg by mouth daily.   Yes Historical Provider, MD  traZODone (DESYREL) 50 MG tablet Take 50 mg by mouth at bedtime as needed for sleep.    Yes Historical Provider, MD     All other systems have been reviewed and were otherwise negative with the exception of those mentioned in the HPI and as above.  Physical Exam: There were no vitals filed for this visit.  General: Alert, no acute distress Cardiovascular: No pedal edema Respiratory: No cyanosis, no use of accessory musculature Skin: No lesions in the area of chief complaint Neurologic: Sensation intact distally Psychiatric: Patient is competent for consent with normal mood and affect Lymphatic: No axillary or cervical lymphadenopathy  MUSCULOSKELETAL: + SLR on the right  Assessment/Plan: Right leg pain  Plan for Procedure(s): RIGHT SIDED  LUMBAR 4-5 DECOMPRESSION AND TRANSFORAMINAL LUMBAR INTERBODY FUSION WITH INSTRUMENTATION AND ALLOGRAFT   Sinclair Ship, MD 10/19/2015 12:57 PM

## 2015-10-20 LAB — HEMOGLOBIN A1C
HEMOGLOBIN A1C: 6.9 % — AB (ref 4.8–5.6)
MEAN PLASMA GLUCOSE: 151 mg/dL

## 2015-10-20 MED ORDER — CEFAZOLIN SODIUM-DEXTROSE 2-4 GM/100ML-% IV SOLN
2.0000 g | INTRAVENOUS | Status: AC
  Administered 2015-10-21: 2 g via INTRAVENOUS
  Filled 2015-10-20: qty 100

## 2015-10-21 ENCOUNTER — Inpatient Hospital Stay (HOSPITAL_COMMUNITY)
Admission: RE | Admit: 2015-10-21 | Discharge: 2015-10-25 | DRG: 460 | Disposition: A | Discharge status: Home Health | Payer: Commercial Managed Care - HMO | Source: Ambulatory Visit | Attending: Orthopedic Surgery | Admitting: Orthopedic Surgery

## 2015-10-21 ENCOUNTER — Inpatient Hospital Stay (HOSPITAL_COMMUNITY): Payer: Commercial Managed Care - HMO

## 2015-10-21 ENCOUNTER — Inpatient Hospital Stay (HOSPITAL_COMMUNITY): Payer: Commercial Managed Care - HMO | Admitting: Certified Registered Nurse Anesthetist

## 2015-10-21 ENCOUNTER — Encounter (HOSPITAL_COMMUNITY): Payer: Self-pay | Admitting: *Deleted

## 2015-10-21 ENCOUNTER — Encounter (HOSPITAL_COMMUNITY)
Admission: RE | Disposition: A | Discharge status: Home Health | Payer: Self-pay | Source: Ambulatory Visit | Attending: Orthopedic Surgery

## 2015-10-21 DIAGNOSIS — Z9102 Food additives allergy status: Secondary | ICD-10-CM

## 2015-10-21 DIAGNOSIS — I252 Old myocardial infarction: Secondary | ICD-10-CM | POA: Diagnosis not present

## 2015-10-21 DIAGNOSIS — E7800 Pure hypercholesterolemia, unspecified: Secondary | ICD-10-CM | POA: Diagnosis not present

## 2015-10-21 DIAGNOSIS — E1151 Type 2 diabetes mellitus with diabetic peripheral angiopathy without gangrene: Secondary | ICD-10-CM | POA: Diagnosis not present

## 2015-10-21 DIAGNOSIS — E119 Type 2 diabetes mellitus without complications: Secondary | ICD-10-CM

## 2015-10-21 DIAGNOSIS — F329 Major depressive disorder, single episode, unspecified: Secondary | ICD-10-CM | POA: Diagnosis not present

## 2015-10-21 DIAGNOSIS — M5410 Radiculopathy, site unspecified: Secondary | ICD-10-CM | POA: Diagnosis present

## 2015-10-21 DIAGNOSIS — M4806 Spinal stenosis, lumbar region: Principal | ICD-10-CM | POA: Diagnosis present

## 2015-10-21 DIAGNOSIS — F17210 Nicotine dependence, cigarettes, uncomplicated: Secondary | ICD-10-CM | POA: Diagnosis present

## 2015-10-21 DIAGNOSIS — T43595A Adverse effect of other antipsychotics and neuroleptics, initial encounter: Secondary | ICD-10-CM | POA: Diagnosis not present

## 2015-10-21 DIAGNOSIS — F319 Bipolar disorder, unspecified: Secondary | ICD-10-CM | POA: Diagnosis not present

## 2015-10-21 DIAGNOSIS — Z7982 Long term (current) use of aspirin: Secondary | ICD-10-CM

## 2015-10-21 DIAGNOSIS — E0590 Thyrotoxicosis, unspecified without thyrotoxic crisis or storm: Secondary | ICD-10-CM | POA: Diagnosis not present

## 2015-10-21 DIAGNOSIS — Z886 Allergy status to analgesic agent status: Secondary | ICD-10-CM | POA: Diagnosis not present

## 2015-10-21 DIAGNOSIS — Z885 Allergy status to narcotic agent status: Secondary | ICD-10-CM

## 2015-10-21 DIAGNOSIS — I2510 Atherosclerotic heart disease of native coronary artery without angina pectoris: Secondary | ICD-10-CM | POA: Diagnosis not present

## 2015-10-21 DIAGNOSIS — M4316 Spondylolisthesis, lumbar region: Secondary | ICD-10-CM | POA: Diagnosis present

## 2015-10-21 DIAGNOSIS — R5383 Other fatigue: Secondary | ICD-10-CM | POA: Diagnosis present

## 2015-10-21 DIAGNOSIS — R509 Fever, unspecified: Secondary | ICD-10-CM | POA: Diagnosis not present

## 2015-10-21 DIAGNOSIS — Z951 Presence of aortocoronary bypass graft: Secondary | ICD-10-CM

## 2015-10-21 DIAGNOSIS — K219 Gastro-esophageal reflux disease without esophagitis: Secondary | ICD-10-CM | POA: Diagnosis not present

## 2015-10-21 DIAGNOSIS — Z981 Arthrodesis status: Secondary | ICD-10-CM | POA: Diagnosis not present

## 2015-10-21 DIAGNOSIS — R339 Retention of urine, unspecified: Secondary | ICD-10-CM | POA: Diagnosis present

## 2015-10-21 DIAGNOSIS — G9341 Metabolic encephalopathy: Secondary | ICD-10-CM | POA: Diagnosis not present

## 2015-10-21 DIAGNOSIS — M79604 Pain in right leg: Secondary | ICD-10-CM | POA: Diagnosis present

## 2015-10-21 DIAGNOSIS — I10 Essential (primary) hypertension: Secondary | ICD-10-CM | POA: Diagnosis present

## 2015-10-21 DIAGNOSIS — M4726 Other spondylosis with radiculopathy, lumbar region: Secondary | ICD-10-CM | POA: Diagnosis not present

## 2015-10-21 DIAGNOSIS — Z888 Allergy status to other drugs, medicaments and biological substances status: Secondary | ICD-10-CM | POA: Diagnosis not present

## 2015-10-21 DIAGNOSIS — Z419 Encounter for procedure for purposes other than remedying health state, unspecified: Secondary | ICD-10-CM

## 2015-10-21 DIAGNOSIS — Z7984 Long term (current) use of oral hypoglycemic drugs: Secondary | ICD-10-CM

## 2015-10-21 DIAGNOSIS — R410 Disorientation, unspecified: Secondary | ICD-10-CM | POA: Diagnosis not present

## 2015-10-21 DIAGNOSIS — F32A Depression, unspecified: Secondary | ICD-10-CM | POA: Diagnosis present

## 2015-10-21 LAB — GLUCOSE, CAPILLARY
GLUCOSE-CAPILLARY: 144 mg/dL — AB (ref 65–99)
GLUCOSE-CAPILLARY: 125 mg/dL — AB (ref 65–99)
Glucose-Capillary: 123 mg/dL — ABNORMAL HIGH (ref 65–99)

## 2015-10-21 SURGERY — POSTERIOR LUMBAR FUSION 1 LEVEL
Anesthesia: General | Laterality: Right

## 2015-10-21 MED ORDER — FLEET ENEMA 7-19 GM/118ML RE ENEM
1.0000 | ENEMA | Freq: Once | RECTAL | Status: DC | PRN
  Filled 2015-10-21: qty 1

## 2015-10-21 MED ORDER — ACETAMINOPHEN 650 MG RE SUPP
650.0000 mg | RECTAL | Status: DC | PRN
  Filled 2015-10-21: qty 1

## 2015-10-21 MED ORDER — LACTATED RINGERS IV SOLN
INTRAVENOUS | Status: DC | PRN
  Administered 2015-10-21: 11:00:00 via INTRAVENOUS

## 2015-10-21 MED ORDER — ACETAMINOPHEN 325 MG PO TABS
650.0000 mg | ORAL_TABLET | ORAL | Status: DC | PRN
  Administered 2015-10-22 – 2015-10-24 (×7): 650 mg via ORAL
  Filled 2015-10-21 (×7): qty 2

## 2015-10-21 MED ORDER — NEOSTIGMINE METHYLSULFATE 10 MG/10ML IV SOLN
INTRAVENOUS | Status: DC | PRN
  Administered 2015-10-21: 2 mg via INTRAVENOUS

## 2015-10-21 MED ORDER — SODIUM CHLORIDE 0.9 % IV SOLN: 250.0000 mL | INTRAVENOUS | Status: DC

## 2015-10-21 MED ORDER — OXYCODONE-ACETAMINOPHEN 5-325 MG PO TABS
1.0000 | ORAL_TABLET | ORAL | Status: DC | PRN
  Administered 2015-10-21 – 2015-10-22 (×2): 1 via ORAL
  Filled 2015-10-21 (×2): qty 1

## 2015-10-21 MED ORDER — EPHEDRINE SULFATE 50 MG/ML IJ SOLN
INTRAMUSCULAR | Status: DC | PRN
  Administered 2015-10-21: 7.5 mg via INTRAVENOUS
  Administered 2015-10-21 (×2): 25 mg via INTRAVENOUS

## 2015-10-21 MED ORDER — EPHEDRINE 5 MG/ML INJ
INTRAVENOUS | Status: AC
  Filled 2015-10-21: qty 10

## 2015-10-21 MED ORDER — IPRATROPIUM-ALBUTEROL 0.5-2.5 (3) MG/3ML IN SOLN: 3.0000 mL | Freq: Four times a day (QID) | RESPIRATORY_TRACT | Status: DC | PRN

## 2015-10-21 MED ORDER — CARVEDILOL 3.125 MG PO TABS
3.1250 mg | ORAL_TABLET | Freq: Two times a day (BID) | ORAL | Status: DC
  Administered 2015-10-23 – 2015-10-25 (×3): 3.125 mg via ORAL
  Filled 2015-10-21 (×6): qty 1

## 2015-10-21 MED ORDER — ONDANSETRON HCL 4 MG/2ML IJ SOLN
INTRAMUSCULAR | Status: AC
  Filled 2015-10-21: qty 2

## 2015-10-21 MED ORDER — PROPOFOL 1000 MG/100ML IV EMUL
INTRAVENOUS | Status: AC
  Filled 2015-10-21: qty 200

## 2015-10-21 MED ORDER — BISACODYL 5 MG PO TBEC: 5.0000 mg | DELAYED_RELEASE_TABLET | Freq: Every day | ORAL | Status: DC | PRN

## 2015-10-21 MED ORDER — ROCURONIUM BROMIDE 50 MG/5ML IV SOLN
INTRAVENOUS | Status: AC
  Filled 2015-10-21: qty 1

## 2015-10-21 MED ORDER — ALUM & MAG HYDROXIDE-SIMETH 200-200-20 MG/5ML PO SUSP
30.0000 mL | Freq: Four times a day (QID) | ORAL | Status: DC | PRN
  Administered 2015-10-21: 30 mL via ORAL
  Filled 2015-10-21: qty 30

## 2015-10-21 MED ORDER — BUPROPION HCL ER (SR) 150 MG PO TB12
150.0000 mg | ORAL_TABLET | Freq: Every morning | ORAL | Status: DC
  Administered 2015-10-22 – 2015-10-25 (×4): 150 mg via ORAL
  Filled 2015-10-21 (×4): qty 1

## 2015-10-21 MED ORDER — THROMBIN 20000 UNITS EX SOLR
CUTANEOUS | Status: AC
  Filled 2015-10-21: qty 20000

## 2015-10-21 MED ORDER — IPRATROPIUM-ALBUTEROL 20-100 MCG/ACT IN AERS: 1.0000 | INHALATION_SPRAY | Freq: Four times a day (QID) | RESPIRATORY_TRACT | Status: DC | PRN

## 2015-10-21 MED ORDER — PHENYLEPHRINE 40 MCG/ML (10ML) SYRINGE FOR IV PUSH (FOR BLOOD PRESSURE SUPPORT)
PREFILLED_SYRINGE | INTRAVENOUS | Status: AC
  Filled 2015-10-21: qty 10

## 2015-10-21 MED ORDER — ALBUTEROL SULFATE HFA 108 (90 BASE) MCG/ACT IN AERS
INHALATION_SPRAY | RESPIRATORY_TRACT | Status: DC | PRN
  Administered 2015-10-21 (×2): 2 via RESPIRATORY_TRACT

## 2015-10-21 MED ORDER — HYDROMORPHONE HCL 1 MG/ML IJ SOLN
INTRAMUSCULAR | Status: AC
Start: 2015-10-21 — End: 2015-10-21
  Administered 2015-10-21: 0.5 mg via INTRAVENOUS
  Filled 2015-10-21: qty 1

## 2015-10-21 MED ORDER — ARTIFICIAL TEARS OP OINT
TOPICAL_OINTMENT | OPHTHALMIC | Status: DC | PRN
  Administered 2015-10-21: 1 via OPHTHALMIC

## 2015-10-21 MED ORDER — SODIUM CHLORIDE 0.9 % IV SOLN
INTRAVENOUS | Status: DC
  Administered 2015-10-22: 19:00:00 via INTRAVENOUS

## 2015-10-21 MED ORDER — PROPOFOL 10 MG/ML IV BOLUS
INTRAVENOUS | Status: AC
  Filled 2015-10-21: qty 20

## 2015-10-21 MED ORDER — LINAGLIPTIN 5 MG PO TABS
5.0000 mg | ORAL_TABLET | Freq: Every day | ORAL | Status: DC
  Administered 2015-10-22 – 2015-10-23 (×2): 5 mg via ORAL
  Filled 2015-10-21 (×2): qty 1

## 2015-10-21 MED ORDER — BUPIVACAINE-EPINEPHRINE 0.25% -1:200000 IJ SOLN
INTRAMUSCULAR | Status: DC | PRN
  Administered 2015-10-21: 10 mL

## 2015-10-21 MED ORDER — METFORMIN HCL ER 500 MG PO TB24
1000.0000 mg | ORAL_TABLET | Freq: Every day | ORAL | Status: DC
  Administered 2015-10-22 – 2015-10-25 (×4): 1000 mg via ORAL
  Filled 2015-10-21 (×4): qty 2

## 2015-10-21 MED ORDER — POVIDONE-IODINE 7.5 % EX SOLN
Freq: Once | CUTANEOUS | Status: DC
  Filled 2015-10-21: qty 118

## 2015-10-21 MED ORDER — CEFAZOLIN IN D5W 1 GM/50ML IV SOLN
1.0000 g | Freq: Three times a day (TID) | INTRAVENOUS | Status: AC
  Administered 2015-10-21 – 2015-10-22 (×2): 1 g via INTRAVENOUS
  Filled 2015-10-21 (×2): qty 50

## 2015-10-21 MED ORDER — ROSUVASTATIN CALCIUM 10 MG PO TABS
20.0000 mg | ORAL_TABLET | Freq: Every day | ORAL | Status: DC
  Administered 2015-10-22 – 2015-10-25 (×4): 20 mg via ORAL
  Filled 2015-10-21: qty 1
  Filled 2015-10-21: qty 2
  Filled 2015-10-21 (×2): qty 1

## 2015-10-21 MED ORDER — DULOXETINE HCL 60 MG PO CPEP
60.0000 mg | ORAL_CAPSULE | Freq: Two times a day (BID) | ORAL | Status: DC
  Administered 2015-10-21 – 2015-10-22 (×2): 60 mg via ORAL
  Filled 2015-10-21 (×2): qty 1

## 2015-10-21 MED ORDER — OXYCODONE-ACETAMINOPHEN 5-325 MG PO TABS
ORAL_TABLET | ORAL | Status: AC
Start: 2015-10-21 — End: 2015-10-21
  Administered 2015-10-21: 2
  Filled 2015-10-21: qty 2

## 2015-10-21 MED ORDER — ZOLPIDEM TARTRATE 5 MG PO TABS: 5.0000 mg | ORAL_TABLET | Freq: Every evening | ORAL | Status: DC | PRN

## 2015-10-21 MED ORDER — GLYCOPYRROLATE 0.2 MG/ML IJ SOLN
INTRAMUSCULAR | Status: DC | PRN
  Administered 2015-10-21 (×2): 0.2 mg via INTRAVENOUS
  Administered 2015-10-21: .3 mg via INTRAVENOUS

## 2015-10-21 MED ORDER — DIAZEPAM 5 MG PO TABS
5.0000 mg | ORAL_TABLET | Freq: Four times a day (QID) | ORAL | Status: DC | PRN
  Administered 2015-10-21: 5 mg via ORAL

## 2015-10-21 MED ORDER — DIAZEPAM 5 MG PO TABS
ORAL_TABLET | ORAL | Status: AC
  Administered 2015-10-21: 5 mg via ORAL
  Filled 2015-10-21: qty 1

## 2015-10-21 MED ORDER — 0.9 % SODIUM CHLORIDE (POUR BTL) OPTIME
TOPICAL | Status: DC | PRN
  Administered 2015-10-21 (×3): 1000 mL

## 2015-10-21 MED ORDER — LIDOCAINE 2% (20 MG/ML) 5 ML SYRINGE
INTRAMUSCULAR | Status: AC
  Filled 2015-10-21: qty 5

## 2015-10-21 MED ORDER — MENTHOL 3 MG MT LOZG: 1.0000 | LOZENGE | OROMUCOSAL | Status: DC | PRN

## 2015-10-21 MED ORDER — PROMETHAZINE HCL 25 MG/ML IJ SOLN: 6.2500 mg | INTRAMUSCULAR | Status: DC | PRN

## 2015-10-21 MED ORDER — PROPOFOL 10 MG/ML IV BOLUS
INTRAVENOUS | Status: DC | PRN
  Administered 2015-10-21: 25 mg via INTRAVENOUS
  Administered 2015-10-21: 180 mg via INTRAVENOUS
  Administered 2015-10-21: 20 mg via INTRAVENOUS
  Administered 2015-10-21: 60 mg via INTRAVENOUS

## 2015-10-21 MED ORDER — QUETIAPINE FUMARATE 100 MG PO TABS
200.0000 mg | ORAL_TABLET | Freq: Three times a day (TID) | ORAL | Status: DC
  Administered 2015-10-21 – 2015-10-23 (×2): 200 mg via ORAL
  Filled 2015-10-21: qty 8
  Filled 2015-10-21: qty 4
  Filled 2015-10-21 (×2): qty 2

## 2015-10-21 MED ORDER — PROPOFOL 500 MG/50ML IV EMUL
INTRAVENOUS | Status: DC | PRN
  Administered 2015-10-21: 100 ug/kg/min via INTRAVENOUS
  Administered 2015-10-21: 15:00:00 via INTRAVENOUS

## 2015-10-21 MED ORDER — DOCUSATE SODIUM 100 MG PO CAPS
100.0000 mg | ORAL_CAPSULE | Freq: Two times a day (BID) | ORAL | Status: DC
  Administered 2015-10-21 – 2015-10-25 (×8): 100 mg via ORAL
  Filled 2015-10-21 (×8): qty 1

## 2015-10-21 MED ORDER — PHENYLEPHRINE HCL 10 MG/ML IJ SOLN
INTRAMUSCULAR | Status: DC | PRN
  Administered 2015-10-21: 40 ug via INTRAVENOUS
  Administered 2015-10-21: 200 ug via INTRAVENOUS
  Administered 2015-10-21: 80 ug via INTRAVENOUS

## 2015-10-21 MED ORDER — FENTANYL CITRATE (PF) 250 MCG/5ML IJ SOLN
INTRAMUSCULAR | Status: AC
  Filled 2015-10-21: qty 5

## 2015-10-21 MED ORDER — LEVOTHYROXINE SODIUM 75 MCG PO TABS
75.0000 ug | ORAL_TABLET | Freq: Every day | ORAL | Status: DC
  Administered 2015-10-22 – 2015-10-25 (×4): 75 ug via ORAL
  Filled 2015-10-21 (×4): qty 1

## 2015-10-21 MED ORDER — GABAPENTIN 400 MG PO CAPS
400.0000 mg | ORAL_CAPSULE | Freq: Three times a day (TID) | ORAL | Status: DC
Start: 2015-10-21 — End: 2015-10-23
  Administered 2015-10-21 – 2015-10-23 (×4): 400 mg via ORAL
  Filled 2015-10-21 (×5): qty 1

## 2015-10-21 MED ORDER — MIDAZOLAM HCL 2 MG/2ML IJ SOLN
INTRAMUSCULAR | Status: AC
  Filled 2015-10-21: qty 2

## 2015-10-21 MED ORDER — ONDANSETRON HCL 4 MG/2ML IJ SOLN
4.0000 mg | INTRAMUSCULAR | Status: DC | PRN
  Administered 2015-10-21: 4 mg via INTRAVENOUS
  Filled 2015-10-21: qty 2

## 2015-10-21 MED ORDER — SODIUM CHLORIDE 0.9 % IV SOLN
INTRAVENOUS | Status: DC | PRN
  Administered 2015-10-21: 13:00:00 via INTRAVENOUS

## 2015-10-21 MED ORDER — ALBUTEROL SULFATE HFA 108 (90 BASE) MCG/ACT IN AERS
INHALATION_SPRAY | RESPIRATORY_TRACT | Status: AC
  Filled 2015-10-21: qty 6.7

## 2015-10-21 MED ORDER — SODIUM CHLORIDE 0.9% FLUSH: 3.0000 mL | INTRAVENOUS | Status: DC | PRN

## 2015-10-21 MED ORDER — ONDANSETRON HCL 4 MG/2ML IJ SOLN
INTRAMUSCULAR | Status: DC | PRN
  Administered 2015-10-21 (×2): 4 mg via INTRAVENOUS

## 2015-10-21 MED ORDER — ARTIFICIAL TEARS OP OINT
TOPICAL_OINTMENT | OPHTHALMIC | Status: AC
  Filled 2015-10-21: qty 3.5

## 2015-10-21 MED ORDER — GLYCOPYRROLATE 0.2 MG/ML IV SOSY
PREFILLED_SYRINGE | INTRAVENOUS | Status: AC
  Filled 2015-10-21: qty 3

## 2015-10-21 MED ORDER — PHENYLEPHRINE HCL 10 MG/ML IJ SOLN
10.0000 mg | INTRAVENOUS | Status: DC | PRN
  Administered 2015-10-21: 50 ug/min via INTRAVENOUS

## 2015-10-21 MED ORDER — SUCCINYLCHOLINE CHLORIDE 200 MG/10ML IV SOSY
PREFILLED_SYRINGE | INTRAVENOUS | Status: AC
  Filled 2015-10-21: qty 10

## 2015-10-21 MED ORDER — SENNOSIDES-DOCUSATE SODIUM 8.6-50 MG PO TABS: 1.0000 | ORAL_TABLET | Freq: Every evening | ORAL | Status: DC | PRN

## 2015-10-21 MED ORDER — METHYLENE BLUE 0.5 % INJ SOLN
INTRAVENOUS | Status: AC
  Filled 2015-10-21: qty 10

## 2015-10-21 MED ORDER — SODIUM CHLORIDE 0.9% FLUSH
3.0000 mL | Freq: Two times a day (BID) | INTRAVENOUS | Status: DC
  Administered 2015-10-23: 3 mL via INTRAVENOUS

## 2015-10-21 MED ORDER — PROMETHAZINE HCL 25 MG/ML IJ SOLN
INTRAMUSCULAR | Status: AC
  Filled 2015-10-21: qty 1

## 2015-10-21 MED ORDER — LACTATED RINGERS IV SOLN
INTRAVENOUS | Status: DC
  Administered 2015-10-21 (×2): via INTRAVENOUS

## 2015-10-21 MED ORDER — HYDROMORPHONE HCL 1 MG/ML IJ SOLN: 0.5000 mg | INTRAMUSCULAR | Status: DC | PRN

## 2015-10-21 MED ORDER — PHENOL 1.4 % MT LIQD: 1.0000 | OROMUCOSAL | Status: DC | PRN

## 2015-10-21 MED ORDER — TRAZODONE HCL 50 MG PO TABS: 50.0000 mg | ORAL_TABLET | Freq: Every evening | ORAL | Status: DC | PRN

## 2015-10-21 MED ORDER — FENTANYL CITRATE (PF) 250 MCG/5ML IJ SOLN
INTRAMUSCULAR | Status: DC | PRN
  Administered 2015-10-21: 50 ug via INTRAVENOUS
  Administered 2015-10-21 (×3): 100 ug via INTRAVENOUS

## 2015-10-21 MED ORDER — SUCCINYLCHOLINE CHLORIDE 20 MG/ML IJ SOLN
INTRAMUSCULAR | Status: DC | PRN
  Administered 2015-10-21 (×2): 100 mg via INTRAVENOUS

## 2015-10-21 MED ORDER — THROMBIN 20000 UNITS EX KIT
PACK | CUTANEOUS | Status: DC | PRN
  Administered 2015-10-21: 20000 [IU] via TOPICAL

## 2015-10-21 MED ORDER — HYDROMORPHONE HCL 1 MG/ML IJ SOLN
0.2500 mg | INTRAMUSCULAR | Status: DC | PRN
  Administered 2015-10-21 (×2): 0.25 mg via INTRAVENOUS
  Administered 2015-10-21: 0.5 mg via INTRAVENOUS

## 2015-10-21 MED ORDER — MIDAZOLAM HCL 2 MG/2ML IJ SOLN
INTRAMUSCULAR | Status: DC | PRN
Start: 2015-10-21 — End: 2015-10-21
  Administered 2015-10-21: 1 mg via INTRAVENOUS

## 2015-10-21 MED ORDER — LIDOCAINE HCL (CARDIAC) 20 MG/ML IV SOLN
INTRAVENOUS | Status: DC | PRN
  Administered 2015-10-21: 60 mg via INTRATRACHEAL

## 2015-10-21 MED ORDER — OXYCODONE-ACETAMINOPHEN 10-325 MG PO TABS
1.0000 | ORAL_TABLET | ORAL | Status: DC | PRN
  Filled 2015-10-21: qty 1

## 2015-10-21 MED ORDER — OXYCODONE HCL 5 MG PO TABS
5.0000 mg | ORAL_TABLET | ORAL | Status: DC | PRN
  Administered 2015-10-21 – 2015-10-22 (×2): 5 mg via ORAL
  Filled 2015-10-21 (×2): qty 1

## 2015-10-21 MED ORDER — BUPIVACAINE-EPINEPHRINE (PF) 0.25% -1:200000 IJ SOLN
INTRAMUSCULAR | Status: AC
  Filled 2015-10-21: qty 30

## 2015-10-21 MED ORDER — SODIUM CHLORIDE 0.9 % IV SOLN
1.0000 mg | INTRAVENOUS | Status: DC | PRN
  Administered 2015-10-21: 1 mg via INTRAVENOUS

## 2015-10-21 MED ORDER — FAMOTIDINE 20 MG PO TABS
20.0000 mg | ORAL_TABLET | Freq: Every day | ORAL | Status: DC
  Administered 2015-10-22 – 2015-10-25 (×4): 20 mg via ORAL
  Filled 2015-10-21 (×4): qty 1

## 2015-10-21 MED ORDER — METHOCARBAMOL 500 MG PO TABS
500.0000 mg | ORAL_TABLET | Freq: Four times a day (QID) | ORAL | Status: DC | PRN
  Administered 2015-10-23 – 2015-10-25 (×3): 500 mg via ORAL
  Filled 2015-10-21 (×2): qty 1

## 2015-10-21 MED ORDER — ROCURONIUM BROMIDE 100 MG/10ML IV SOLN
INTRAVENOUS | Status: DC | PRN
  Administered 2015-10-21: 40 mg via INTRAVENOUS

## 2015-10-21 SURGICAL SUPPLY — 82 items
APL SKNCLS STERI-STRIP NONHPOA (GAUZE/BANDAGES/DRESSINGS) ×1
BENZOIN TINCTURE PRP APPL 2/3 (GAUZE/BANDAGES/DRESSINGS) ×2 IMPLANT
BLADE SURG ROTATE 9660 (MISCELLANEOUS) IMPLANT
BUR PRESCISION 1.7 ELITE (BURR) ×1 IMPLANT
BUR ROUND PRECISION 4.0 (BURR) ×1 IMPLANT
BUR SABER RD CUTTING 3.0 (BURR) IMPLANT
CAGE BULLET CONCORDE 9X9X27 (Cage) ×1 IMPLANT
CARTRIDGE OIL MAESTRO DRILL (MISCELLANEOUS) ×1 IMPLANT
CONT SPEC STER OR (MISCELLANEOUS) ×2 IMPLANT
COVER MAYO STAND STRL (DRAPES) ×4 IMPLANT
COVER SURGICAL LIGHT HANDLE (MISCELLANEOUS) ×2 IMPLANT
DIFFUSER DRILL AIR PNEUMATIC (MISCELLANEOUS) ×2 IMPLANT
DRAIN CHANNEL 15F RND FF W/TCR (WOUND CARE) IMPLANT
DRAPE C-ARM 42X72 X-RAY (DRAPES) ×2 IMPLANT
DRAPE C-ARMOR (DRAPES) ×1 IMPLANT
DRAPE POUCH INSTRU U-SHP 10X18 (DRAPES) ×2 IMPLANT
DRAPE SURG 17X23 STRL (DRAPES) ×8 IMPLANT
DURAPREP 26ML APPLICATOR (WOUND CARE) ×2 IMPLANT
ELECT BLADE 4.0 EZ CLEAN MEGAD (MISCELLANEOUS) ×2
ELECT CAUTERY BLADE 6.4 (BLADE) ×2 IMPLANT
ELECT REM PT RETURN 9FT ADLT (ELECTROSURGICAL) ×2
ELECTRODE BLDE 4.0 EZ CLN MEGD (MISCELLANEOUS) ×1 IMPLANT
ELECTRODE REM PT RTRN 9FT ADLT (ELECTROSURGICAL) ×1 IMPLANT
EVACUATOR SILICONE 100CC (DRAIN) IMPLANT
FEE INTRAOP MONITOR IMPULS NCS (MISCELLANEOUS) IMPLANT
GAUZE SPONGE 4X4 12PLY STRL (GAUZE/BANDAGES/DRESSINGS) ×2 IMPLANT
GAUZE SPONGE 4X4 16PLY XRAY LF (GAUZE/BANDAGES/DRESSINGS) ×5 IMPLANT
GLOVE BIO SURGEON STRL SZ7 (GLOVE) ×2 IMPLANT
GLOVE BIO SURGEON STRL SZ8 (GLOVE) ×2 IMPLANT
GLOVE BIOGEL PI IND STRL 7.0 (GLOVE) ×1 IMPLANT
GLOVE BIOGEL PI IND STRL 8 (GLOVE) ×1 IMPLANT
GLOVE BIOGEL PI INDICATOR 7.0 (GLOVE) ×1
GLOVE BIOGEL PI INDICATOR 8 (GLOVE) ×1
GOWN STRL REUS W/ TWL LRG LVL3 (GOWN DISPOSABLE) ×2 IMPLANT
GOWN STRL REUS W/ TWL XL LVL3 (GOWN DISPOSABLE) ×1 IMPLANT
GOWN STRL REUS W/TWL LRG LVL3 (GOWN DISPOSABLE) ×4
GOWN STRL REUS W/TWL XL LVL3 (GOWN DISPOSABLE) ×2
INTRAOP MONITOR FEE IMPULS NCS (MISCELLANEOUS) ×1
INTRAOP MONITOR FEE IMPULSE (MISCELLANEOUS) ×1
IV CATH 14GX2 1/4 (CATHETERS) ×2 IMPLANT
KIT BASIN OR (CUSTOM PROCEDURE TRAY) ×2 IMPLANT
KIT POSITION SURG JACKSON T1 (MISCELLANEOUS) ×2 IMPLANT
KIT ROOM TURNOVER OR (KITS) ×2 IMPLANT
MARKER SKIN DUAL TIP RULER LAB (MISCELLANEOUS) ×2 IMPLANT
MIX DBX 10CC 35% BONE (Bone Implant) ×1 IMPLANT
NDL HYPO 25GX1X1/2 BEV (NEEDLE) ×1 IMPLANT
NDL SAFETY ECLIPSE 18X1.5 (NEEDLE) ×1 IMPLANT
NDL SPNL 18GX3.5 QUINCKE PK (NEEDLE) ×2 IMPLANT
NEEDLE 22X1 1/2 (OR ONLY) (NEEDLE) ×2 IMPLANT
NEEDLE HYPO 18GX1.5 SHARP (NEEDLE) ×2
NEEDLE HYPO 25GX1X1/2 BEV (NEEDLE) ×2 IMPLANT
NEEDLE SPNL 18GX3.5 QUINCKE PK (NEEDLE) ×4 IMPLANT
NS IRRIG 1000ML POUR BTL (IV SOLUTION) ×2 IMPLANT
OIL CARTRIDGE MAESTRO DRILL (MISCELLANEOUS) ×2
PACK LAMINECTOMY ORTHO (CUSTOM PROCEDURE TRAY) ×2 IMPLANT
PACK UNIVERSAL I (CUSTOM PROCEDURE TRAY) ×2 IMPLANT
PAD ARMBOARD 7.5X6 YLW CONV (MISCELLANEOUS) ×4 IMPLANT
PATTIES SURGICAL .5 X1 (DISPOSABLE) ×2 IMPLANT
PATTIES SURGICAL .5X1.5 (GAUZE/BANDAGES/DRESSINGS) ×2 IMPLANT
ROD PRE BENT EXP 40MM (Rod) ×2 IMPLANT
SCREW SET SINGLE INNER (Screw) ×4 IMPLANT
SCREW VIPER CORT FIX 6.00X30 (Screw) ×2 IMPLANT
SCREW VIPER CORT FIX 6X35 (Screw) ×2 IMPLANT
SPONGE INTESTINAL PEANUT (DISPOSABLE) ×2 IMPLANT
SPONGE SURGIFOAM ABS GEL 100 (HEMOSTASIS) ×2 IMPLANT
STRIP CLOSURE SKIN 1/2X4 (GAUZE/BANDAGES/DRESSINGS) ×4 IMPLANT
SURGIFLO W/THROMBIN 8M KIT (HEMOSTASIS) IMPLANT
SUT MNCRL AB 4-0 PS2 18 (SUTURE) ×4 IMPLANT
SUT VIC AB 0 CT1 18XCR BRD 8 (SUTURE) ×1 IMPLANT
SUT VIC AB 0 CT1 8-18 (SUTURE) ×2
SUT VIC AB 1 CT1 18XCR BRD 8 (SUTURE) ×2 IMPLANT
SUT VIC AB 1 CT1 8-18 (SUTURE) ×6
SUT VIC AB 2-0 CT2 18 VCP726D (SUTURE) ×2 IMPLANT
SYR 20CC LL (SYRINGE) ×2 IMPLANT
SYR BULB IRRIGATION 50ML (SYRINGE) ×2 IMPLANT
SYR CONTROL 10ML LL (SYRINGE) ×4 IMPLANT
SYR TB 1ML LUER SLIP (SYRINGE) ×2 IMPLANT
TOWEL OR 17X24 6PK STRL BLUE (TOWEL DISPOSABLE) ×2 IMPLANT
TOWEL OR 17X26 10 PK STRL BLUE (TOWEL DISPOSABLE) ×2 IMPLANT
TRAY FOLEY CATH 16FRSI W/METER (SET/KITS/TRAYS/PACK) ×2 IMPLANT
WATER STERILE IRR 1000ML POUR (IV SOLUTION) ×2 IMPLANT
YANKAUER SUCT BULB TIP NO VENT (SUCTIONS) ×2 IMPLANT

## 2015-10-21 NOTE — Anesthesia Postprocedure Evaluation (Signed)
Anesthesia Post Note  Patient: [name]  Procedure(s) Performed: Procedure(s) (LRB): RIGHT SIDED LUMBAR 4-5 TRANSFORAMINAL LUMBAR INTERBODY FUSION WITH INSTRUMENTATION AND ALLOGRAFT (Right)  Patient location during evaluation: PACU Anesthesia Type: General Level of consciousness: sedated Pain management: pain level controlled Vital Signs Assessment: post-procedure vital signs reviewed and stable Respiratory status: spontaneous breathing and respiratory function stable Cardiovascular status: stable Anesthetic complications: no    Last Vitals:  Filed Vitals:   10/21/15 1745 10/21/15 1750  BP: 84/48 100/65  Pulse: 77   Temp:    Resp: 14 16    Last Pain:  Filed Vitals:   10/21/15 1757  PainSc: [place]

## 2015-10-21 NOTE — Progress Notes (Signed)
Patient transferred to 5C08. Patient is drowsy, arouses to verbal stimuli, oriented x4, 2L [place], VSS, gauze dressing on back is clean, dry, intact. Oriented to unit, staff, fall prevention policy. Family is at bedside.
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
## 2015-10-21 NOTE — Progress Notes (Signed)
    Patient doing well Patient denies R leg pain + LBP Had to cath patient once last night with bladder scan of 720   Physical Exam: Filed Vitals:   10/21/15 1844 10/21/15 2148  BP: 111/48 131/56  Pulse: 76 74  Temp: 98 F (36.7 C) 99.4 F (37.4 C)  Resp: 20 18   Patient appears sedated Dressing in place NVI  POD #1 s/p L4/5 TLIF with expected LBP and improved R leg pain  - up with PT/OT, encourage ambulation - will need to watch bladder function and scan and cath as needed, CES very unlikely, as patient has full strength in b/l legs and feet without leg pain - Percocet for pain, Valium for muscle spasms - likely d/c home tomorrow depending on progress

## 2015-10-21 NOTE — Transfer of Care (Signed)
Immediate Anesthesia Transfer of Care Note  Patient: [name]  Procedure(s) Performed: Procedure(s) with comments: RIGHT SIDED LUMBAR 4-5 TRANSFORAMINAL LUMBAR INTERBODY FUSION WITH INSTRUMENTATION AND ALLOGRAFT (Right) - RIGHT SIDED LUMBAR 4-5 TRANSFORAMINAL LUMBAR INTERBODY FUSION WITH INSTRUMENTATION AND ALLOGRAFT  Patient Location: PACU  Anesthesia Type:General  Level of Consciousness: awake, alert  and oriented  Airway & Oxygen Therapy: Patient Spontanous Breathing and Patient connected to face mask oxygen  Post-op Assessment: Report given to RN, Post -op Vital signs reviewed and stable and Patient moving all extremities X 4  Post vital signs: Reviewed and stable  Last Vitals:  Filed Vitals:   10/21/15 1638 10/21/15 1642  BP: 126/75   Pulse: 79 81  Temp:    Resp:  18    Last Pain:  Filed Vitals:   10/21/15 1646  PainSc: 5       Patients Stated Pain Goal: 5 ([id number])  Complications: No apparent anesthesia complications

## 2015-10-21 NOTE — Anesthesia Preprocedure Evaluation (Signed)
Anesthesia Evaluation  Patient identified by MRN, date of birth, ID band Patient awake    Reviewed: Allergy & Precautions, NPO status , Patient's Chart, lab work & pertinent test results  History of Anesthesia Complications Negative for: history of anesthetic complications  Airway Mallampati: II   Neck ROM: Full    Dental  (+) Teeth Intact   Pulmonary Current Smoker,    breath sounds clear to auscultation       Cardiovascular hypertension, + CAD, + Past MI and + Peripheral Vascular Disease   Rate:Normal     Neuro/Psych    GI/Hepatic GERD  ,  Endo/Other  diabetesHyperthyroidism   Renal/GU      Musculoskeletal  (+) Arthritis ,   Abdominal   Peds  Hematology   Anesthesia Other Findings   Reproductive/Obstetrics                             Anesthesia Physical Anesthesia Plan  ASA: III  Anesthesia Plan: General   Post-op Pain Management:    Induction:   Airway Management Planned: Oral ETT  Additional Equipment:   Intra-op Plan:   Post-operative Plan: Extubation in OR  Informed Consent: I have reviewed the patients History and Physical, chart, labs and discussed the procedure including the risks, benefits and alternatives for the proposed anesthesia with the patient or authorized representative who has indicated his/her understanding and acceptance.   Dental advisory given  Plan Discussed with: CRNA and Surgeon  Anesthesia Plan Comments:         Anesthesia Quick Evaluation

## 2015-10-21 NOTE — Anesthesia Procedure Notes (Signed)
Procedure Name: Intubation Date/Time: 10/21/2015 11:45 AM Performed by: Collier Bullock Pre-anesthesia Checklist: Patient identified, Emergency Drugs available, Suction available and Patient being monitored Patient Re-evaluated:Patient Re-evaluated prior to inductionOxygen Delivery Method: Circle system utilized Preoxygenation: Pre-oxygenation with 100% oxygen Intubation Type: IV induction Ventilation: Mask ventilation without difficulty Laryngoscope Size: Mac and 3 Grade View: Grade I Tube type: Oral Tube size: 7.0 mm Number of attempts: 1 Airway Equipment and Method: Stylet Placement Confirmation: ETT inserted through vocal cords under direct vision,  positive ETCO2 and breath sounds checked- equal and bilateral Secured at: 23 cm Tube secured with: Tape Dental Injury: Teeth and Oropharynx as per pre-operative assessment

## 2015-10-22 ENCOUNTER — Inpatient Hospital Stay (HOSPITAL_COMMUNITY): Payer: Commercial Managed Care - HMO

## 2015-10-22 DIAGNOSIS — G9341 Metabolic encephalopathy: Secondary | ICD-10-CM

## 2015-10-22 DIAGNOSIS — R509 Fever, unspecified: Secondary | ICD-10-CM

## 2015-10-22 LAB — BASIC METABOLIC PANEL
Anion gap: 4 — ABNORMAL LOW (ref 5–15)
BUN: 5 mg/dL — ABNORMAL LOW (ref 6–20)
CALCIUM: 8.3 mg/dL — AB (ref 8.9–10.3)
CO2: 26 mmol/L (ref 22–32)
CREATININE: 0.75 mg/dL (ref 0.44–1.00)
Chloride: 106 mmol/L (ref 101–111)
Glucose, Bld: 112 mg/dL — ABNORMAL HIGH (ref 65–99)
Potassium: 4.1 mmol/L (ref 3.5–5.1)
SODIUM: 136 mmol/L (ref 135–145)

## 2015-10-22 LAB — BLOOD GAS, ARTERIAL
ACID-BASE DEFICIT: 0.1 mmol/L (ref 0.0–2.0)
Bicarbonate: 25.1 mEq/L — ABNORMAL HIGH (ref 20.0–24.0)
Drawn by: 244801
O2 Content: 3 L/min
O2 SAT: 87.9 %
PATIENT TEMPERATURE: 98.6
PO2 ART: 57.5 mmHg — AB (ref 80.0–100.0)
TCO2: 26.6 mmol/L (ref 0–100)
pCO2 arterial: 49.3 mmHg — ABNORMAL HIGH (ref 35.0–45.0)
pH, Arterial: 7.327 — ABNORMAL LOW (ref 7.350–7.450)

## 2015-10-22 LAB — CBC
HCT: 40.4 % (ref 36.0–46.0)
HEMOGLOBIN: 12.7 g/dL (ref 12.0–15.0)
MCH: 29.1 pg (ref 26.0–34.0)
MCHC: 31.4 g/dL (ref 30.0–36.0)
MCV: 92.4 fL (ref 78.0–100.0)
PLATELETS: 155 10*3/uL (ref 150–400)
RBC: 4.37 MIL/uL (ref 3.87–5.11)
RDW: 13.8 % (ref 11.5–15.5)
WBC: 12.7 10*3/uL — ABNORMAL HIGH (ref 4.0–10.5)

## 2015-10-22 LAB — AMMONIA: AMMONIA: 19 umol/L (ref 9–35)

## 2015-10-22 LAB — GLUCOSE, CAPILLARY: GLUCOSE-CAPILLARY: 117 mg/dL — AB (ref 65–99)

## 2015-10-22 MED ORDER — CETYLPYRIDINIUM CHLORIDE 0.05 % MT LIQD
7.0000 mL | Freq: Two times a day (BID) | OROMUCOSAL | Status: DC
  Administered 2015-10-23 – 2015-10-25 (×2): 7 mL via OROMUCOSAL

## 2015-10-22 MED ORDER — SODIUM CHLORIDE 0.9 % IV SOLN
3.0000 g | Freq: Four times a day (QID) | INTRAVENOUS | Status: AC
  Administered 2015-10-22 – 2015-10-24 (×10): 3 g via INTRAVENOUS
  Filled 2015-10-22 (×13): qty 3

## 2015-10-22 MED ORDER — WHITE PETROLATUM GEL
Status: AC
  Administered 2015-10-22: 0.2
  Filled 2015-10-22: qty 1

## 2015-10-22 MED ORDER — NALOXONE HCL 0.4 MG/ML IJ SOLN
0.1000 mg | Freq: Once | INTRAMUSCULAR | Status: AC
Start: 2015-10-22 — End: 2015-10-22
  Administered 2015-10-22: 0.1 mg via INTRAVENOUS
  Filled 2015-10-22: qty 1

## 2015-10-22 MED ORDER — DULOXETINE HCL 60 MG PO CPEP
60.0000 mg | ORAL_CAPSULE | Freq: Two times a day (BID) | ORAL | Status: DC
  Administered 2015-10-22 – 2015-10-23 (×2): 60 mg via ORAL
  Filled 2015-10-22 (×2): qty 1

## 2015-10-22 MED FILL — Thrombin For Soln 20000 Unit: CUTANEOUS | Qty: 1 | Status: AC

## 2015-10-22 MED FILL — Sodium Chloride IV Soln 0.9%: INTRAVENOUS | Qty: 2000 | Status: AC

## 2015-10-22 MED FILL — Heparin Sodium (Porcine) Inj 1000 Unit/ML: INTRAMUSCULAR | Qty: 30 | Status: AC

## 2015-10-22 NOTE — Progress Notes (Signed)
Of note, I did just checking with the patient's nurse.  The patient continues to be lethargic and sedated.  She continues to retain urine.  She has been walking and is able to sit up, however, she continues to be very sedated, and has not had any pain medication recently.  Given her extensive medical history and symptoms above, I do wish to proceed with a hospitalist consult.

## 2015-10-22 NOTE — Consult Note (Signed)
[name] [id number] DOB: 16-Mar-1961 DOA: 10/21/2015    Chief Complaint: lethargy  HPI: [name] is a 55 y.o. female with medical history significant of chronic back pain, depression, DM, and bipolar d/o.  She was admitted into the hospital on 6/22 by Dr. Lynann Bologna for L4/L5 fusion.  Nursing reports that the patient has been sleepy/sedated since procedure yesterday  She is able to wake up and follow directions, work with PT etc but falls back to sleep.  She was also found to have a fever this AM.  No recent labs.   Her pulse ox on 2L is low 90s.   She got 3 doses of dilaudid yesterday and percocet this AM.   She had urinary retention that required I/O caths and then foley placement  Mom at bedside says at baseline she drives a car, has a husband and son at home.   Review of Systems: unable to do due to AMS   Past Medical History  Diagnosis Date  . Back pain, chronic   . Depression   . Myocardial infarction ([place]) 11/20/06  . Hypertension   . Hypercholesteremia   . Coronary artery disease   . Allergy   . Arthritis   . Neuromuscular disorder ([place])   . Bronchitis     hx-no inhalers at home  . Diabetes mellitus     diet controlled  . DDD (degenerative disc disease), lumbar   . Ejection fraction     .  Marland Kitchen Hx of CABG   . Carotid bruit   . Nodule of neck   . Multiple thyroid nodules   . Hypothyroidism   . Anxiety   . GERD (gastroesophageal reflux disease)   . Bipolar disorder ([place])   . Carpal tunnel syndrome on both sides     Past Surgical History  Procedure Laterality Date  . Knee surgery  2009    lt   . Tubal ligation    . Breast surgery      rt br cystx2  . Abdominal hysterectomy  1984  . Coronary artery bypass graft  2008  . Orif ankle fracture Right 07/12/2012    Procedure: OPEN REDUCTION INTERNAL FIXATION (ORIF) RIGHT ANKLE FRACTURE;  Surgeon: Alta Corning, MD;  Location: [facility];  Service: Orthopedics;   Laterality: Right;  . Wrist surgery Left      reports that she has been smoking Cigarettes.  She has a 7.25 pack-year smoking history. She has never used smokeless tobacco. She reports that she does not drink alcohol or use illicit drugs.  Allergies  Allergen Reactions  . Ibuprofen Swelling and Rash  . Prednisone Swelling and Rash  . Coconut Flavor Swelling and Rash  . Cyclobenzaprine Other (See Comments)    Reaction unknown  . Morphine Nausea And Vomiting    Family History  Problem Relation Age of Onset  . Heart disease Mother   . Hyperlipidemia Mother   . Hypertension Mother   . Heart disease Father   . Stroke Father   . Hypertension Father   . Hyperlipidemia Father   . Hypertension Sister   . Hypertension Brother   . Cancer Daughter     unknown  . Hypertension Maternal Grandmother   . Hypertension Maternal Grandfather   . Cancer Maternal Grandfather   . Hypertension Paternal Grandmother   . Hypertension Paternal Grandfather   . Cancer Maternal Aunt     breast and ovarian  . Cancer Paternal Aunt  breast  . Colon cancer Neg Hx      Prior to Admission medications   Medication Sig Start Date End Date Taking? Authorizing Provider  ACCU-CHEK AVIVA PLUS test strip Use to check blood sugar 3 times daily. 08/30/15  Yes Renato Shin, MD  aspirin EC 81 MG tablet Take 81 mg by mouth 2 (two) times daily.   Yes Historical Provider, MD  buPROPion (WELLBUTRIN SR) 150 MG 12 hr tablet Take 150 mg by mouth every morning.    Yes Historical Provider, MD  carvedilol (COREG) 3.125 MG tablet TAKE 1 TABLET (3.125 MG TOTAL) BY MOUTH 2 (TWO) TIMES DAILY. 08/05/14  Yes Shawnee Knapp, MD  diazepam (VALIUM) 5 MG tablet TAKE 1 TABLET BY MOUTH EVERY TWELVE HOURS AS NEEDED FOR MUSCLE SPASM OR SEDATION 01/03/15  Yes Shawnee Knapp, MD  DULoxetine (CYMBALTA) 60 MG capsule Take 60 mg by mouth 2 (two) times daily.   Yes Historical Provider, MD  gabapentin (NEURONTIN) 400 MG capsule Take 400 mg by mouth 3  (three) times daily.  01/01/15  Yes Historical Provider, MD  levothyroxine (SYNTHROID, LEVOTHROID) 75 MCG tablet TAKE 1 TABLET (75 MCG TOTAL) BY MOUTH DAILY BEFORE BREAKFAST. Patient taking differently: Take 75 mcg by mouth daily before breakfast.  10/14/15  Yes Renato Shin, MD  metFORMIN (GLUCOPHAGE-XR) 500 MG 24 hr tablet Take 2 tablets (1,000 mg total) by mouth daily with breakfast. 07/29/15  Yes Renato Shin, MD  oxyCODONE-acetaminophen (PERCOCET) 10-325 MG tablet Take 1 tablet by mouth every 6 (six) hours as needed for pain.   Yes Historical Provider, MD  QUEtiapine (SEROQUEL) 200 MG tablet Take 200 mg by mouth 3 (three) times daily.   Yes Historical Provider, MD  ranitidine (ZANTAC) 150 MG tablet Take 1 tablet (150 mg total) by mouth 2 (two) times daily. 07/24/14  Yes Shawnee Knapp, MD  rosuvastatin (CRESTOR) 20 MG tablet Take 20 mg by mouth daily.   Yes Historical Provider, MD  sitaGLIPtin (JANUVIA) 50 MG tablet Take 50 mg by mouth daily.   Yes Historical Provider, MD  traZODone (DESYREL) 50 MG tablet Take 50 mg by mouth at bedtime as needed for sleep.    Yes Historical Provider, MD  Ipratropium-Albuterol (COMBIVENT RESPIMAT) 20-100 MCG/ACT AERS respimat Inhale 1 puff into the lungs every 6 (six) hours. Patient taking differently: Inhale 1 puff into the lungs every 6 (six) hours as needed for wheezing or shortness of breath.  12/16/13   Shawnee Knapp, MD    Physical Exam: Filed Vitals:   10/22/15 [id number] 10/22/15 0542 10/22/15 0905 10/22/15 1352  BP: 90/45 95/54 92/57  96/58  Pulse: 92 83 80 76  Temp: 100.5 F (38.1 C) 101.2 F (38.4 C) 99.1 F (37.3 C)   TempSrc: Oral Oral Oral   Resp: 18 17 20 20   Height:      Weight:      SpO2: 92% 100% 94%       Constitutional: will awaken but sleepy  Filed Vitals:   10/22/15 0203 10/22/15 0542 10/22/15 0905 10/22/15 1352  BP: 90/45 95/54 92/57  96/58  Pulse: 92 83 80 76  Temp: 100.5 F (38.1 C) 101.2 F (38.4 C) 99.1 F (37.3 C)   TempSrc: Oral  Oral Oral   Resp: 18 17 20 20   Height:      Weight:      SpO2: 92% 100% 94%    Eyes: PERRL, lids and conjunctivae normal ENMT: Mucous membranes are moist. Posterior pharynx clear of any  exudate or lesions.Normal dentition.  Neck: normal, supple, no masses, no thyromegaly Respiratory:diminished, no wheezing Cardiovascular: Regular rate and rhythm, no murmurs / rubs / gallops. No extremity edema. 2+ pedal pulses. No carotid bruits.  Abdomen: no tenderness, no masses palpated. No hepatosplenomegaly. Bowel sounds positive.  Musculoskeletal: no clubbing / cyanosis. No joint deformity upper and lower extremities. Good ROM, no contractures. Normal muscle tone.  Skin: birthmark on right leg     Labs on Admission: I have personally reviewed following labs and imaging studies  CBC:  Recent Labs Lab 10/19/15 0940  WBC 8.6  NEUTROABS 4.8  HGB 15.0  HCT 46.0  MCV 91.6  PLT [id number]   Basic Metabolic Panel:  Recent Labs Lab 10/19/15 0940  NA 140  K 4.1  CL 109  CO2 25  GLUCOSE 121*  BUN 7  CREATININE 0.80  CALCIUM 9.3   GFR: Estimated Creatinine Clearance: 75 mL/min (by C-G formula based on Cr of 0.8). Liver Function Tests:  Recent Labs Lab 10/19/15 0940  AST 14*  ALT 8*  ALKPHOS 91  BILITOT 0.4  PROT 6.6  ALBUMIN 3.7   No results for input(s): LIPASE, AMYLASE in the last 168 hours. No results for input(s): AMMONIA in the last 168 hours. Coagulation Profile:  Recent Labs Lab 10/19/15 0940  INR 0.93   Cardiac Enzymes: No results for input(s): CKTOTAL, CKMB, CKMBINDEX, TROPONINI in the last 168 hours. BNP (last 3 results) No results for input(s): PROBNP in the last 8760 hours. HbA1C: No results for input(s): HGBA1C in the last 72 hours. CBG:  Recent Labs Lab 10/19/15 0828 10/21/15 0833 10/21/15 1031 10/21/15 1705  GLUCAP 153* 144* 125* 123*   Lipid Profile: No results for input(s): CHOL, HDL, LDLCALC, TRIG, CHOLHDL, LDLDIRECT in the last 72  hours. Thyroid Function Tests: No results for input(s): TSH, T4TOTAL, FREET4, T3FREE, THYROIDAB in the last 72 hours. Anemia Panel: No results for input(s): VITAMINB12, FOLATE, FERRITIN, TIBC, IRON, RETICCTPCT in the last 72 hours. Urine analysis:    Component Value Date/Time   COLORURINE YELLOW 10/19/2015 [place] 10/19/2015 0941   LABSPEC 1.007 10/19/2015 0941   PHURINE 6.0 10/19/2015 0941   GLUCOSEU NEGATIVE 10/19/2015 0941   HGBUR NEGATIVE 10/19/2015 0941   BILIRUBINUR NEGATIVE 10/19/2015 0941   BILIRUBINUR negative 10/01/2015 1556   BILIRUBINUR neg 07/17/2013 1530   KETONESUR NEGATIVE 10/19/2015 0941   KETONESUR negative 10/01/2015 [place] 10/19/2015 0941   PROTEINUR negative 10/01/2015 1556   PROTEINUR neg 07/17/2013 1530   UROBILINOGEN 0.2 10/01/2015 1556   UROBILINOGEN 0.2 08/19/2011 2042   NITRITE NEGATIVE 10/19/2015 0941   NITRITE Negative 10/01/2015 1556   NITRITE neg 07/17/2013 1530   LEUKOCYTESUR NEGATIVE 10/19/2015 0941   Sepsis Labs: Invalid input(s): PROCALCITONIN, LACTICIDVEN Recent Results (from the past 240 hour(s))  Surgical pcr screen     Status: None   Collection Time: 10/19/15  9:40 AM  Result Value Ref Range Status   MRSA, PCR NEGATIVE NEGATIVE Final   Staphylococcus aureus NEGATIVE NEGATIVE Final    Comment:        The Xpert SA Assay (FDA approved for NASAL specimens in patients over 74 years of age), is one component of a comprehensive surveillance program.  Test performance has been validated by [facility] for patients greater than or equal to 35 year old. It is not intended to diagnose infection nor to guide or monitor treatment.      Radiological Exams on Admission: Dg  Lumbar Spine 2-3 Views  10/21/2015  CLINICAL DATA:  TLIF L4-5, right sided Fluoro time was 61 seconds EXAM: LUMBAR SPINE - 2-3 VIEW; DG C-ARM 61-120 MIN COMPARISON:  10/21/2015 FINDINGS: Numbering is based on previous imaging. Patient  has undergone posterior fusion L4 -L5 with interbody fusion device. IMPRESSION: Status post posterior fusion L4-5. Electronically Signed   By: Nolon Nations M.D.   On: 10/21/2015 15:44   Dg Lumbar Spine 1 View  10/21/2015  CLINICAL DATA:  Right L4-5 transforaminal lumbar interbody fusion. EXAM: LUMBAR SPINE - 1 VIEW COMPARISON:  12/17/2014 lumbar spine MRI FINDINGS: Portable cross-table lateral intraoperative lumbar spine radiograph demonstrates 2 posterior approach surgical marking devices with the tip of the more superior device overlying the lower L2 spinous process and the tip of the more inferior device in the posterior soft tissues between the L4 and L5 spinous processes. Mild-to-moderate lumbar spondylosis. IMPRESSION: Posterior approach surgical marking device positions as described. Electronically Signed   By: Ilona Sorrel M.D.   On: 10/21/2015 13:40   Dg C-arm 1-60 Min  10/21/2015  CLINICAL DATA:  TLIF L4-5, right sided Fluoro time was 61 seconds EXAM: LUMBAR SPINE - 2-3 VIEW; DG C-ARM 61-120 MIN COMPARISON:  10/21/2015 FINDINGS: Numbering is based on previous imaging. Patient has undergone posterior fusion L4 -L5 with interbody fusion device. IMPRESSION: Status post posterior fusion L4-5. Electronically Signed   By: Nolon Nations M.D.   On: 10/21/2015 15:44      Assessment/Plan Active Problems:   Depression   Hypertension   Diabetes (HCC)   Radiculopathy    Altered mental status/metabolic encephalopathy -ABG -chest x ray -on multiple mood altering meds-- hold  Fever -tylenol PRN -x ray- to r/o aspiration Blood cultures  HTN -hold meds as BP soft  DM -check blood sugars  Posterior fusion l4-l5 Per primary  Code Status: full Family Communication: mother at bedside    [facility] Pager [phone]  If 7PM-7AM, please contact night-coverage [email or web address] Password [facility]  10/22/2015, 2:06 PM

## 2015-10-22 NOTE — Progress Notes (Signed)
Of note, given the patients continued unexplained lethargy and urinary retention we have consulted [facility] and Dr. Lucianne Lei will be seeing the patient for additional evaluation per telephone consult.   Pricilla Holm, PA-C

## 2015-10-22 NOTE — Progress Notes (Signed)
CSW received consult regarding PT recommendation of SNF at discharge.   Patient is refusing SNF. Patient's sister is at bedside and says patient would like to go home with home health services.  CSW signing off.   Percell Locus Maricella Filyaw LCSWA [phone]

## 2015-10-22 NOTE — Evaluation (Signed)
Occupational Therapy Evaluation Patient Details Name: [name] MRN: [id number] DOB: 1960/11/17 Today's Date: 10/22/2015    History of Present Illness pt is a 55 y/o female with h/o MI, HTN, CABG, DM, DDD, admitted with a R sided L4 radiculopathy and spondylolisthesis, s/p R TLIF and L PLA of L4/5 with fixation and allograft.   Clinical Impression   Per pts family, she was independent with ADLs PTA. Currently pt is mod assist +2 for functional mobility and max assist for all ADLs secondary to lethargy. Attempted back education with pt and family but pt unable to maintain arousal. Pts family reports that she will not have a lot of reliable support upon return home and will need to be at a mod I-independent level. At this time, recommending SNF for further rehab in order to maximize independence and safety with ADLs and functional mobility prior to return home. Pt would benefit from continued skilled OT to address established goals.    Follow Up Recommendations  SNF;Supervision/Assistance - 24 hour    Equipment Recommendations  Other (comment) (TBD at next venue)    Recommendations for Other Services       Precautions / Restrictions Precautions Precautions: Fall;Back Precaution Booklet Issued: No Precaution Comments: Reviewed precautions with pt and family Required Braces or Orthoses: Spinal Brace Spinal Brace: Thoracolumbosacral orthotic;Applied in sitting position Restrictions Weight Bearing Restrictions: No      Mobility Bed Mobility Overal bed mobility: Needs Assistance Bed Mobility: Rolling;Sidelying to Sit Rolling: Mod assist Sidelying to sit: Mod assist       General bed mobility comments: cues for technique and assist throughout  Transfers Overall transfer level: Needs assistance   Transfers: Sit to/from Stand Sit to Stand: Mod assist;+2 physical assistance         General transfer comment: cues for hand placement    Balance Overall balance  assessment: Needs assistance Sitting-balance support: Feet supported;Bilateral upper extremity supported Sitting balance-Leahy Scale: Fair     Standing balance support: Bilateral upper extremity supported Standing balance-Leahy Scale: Poor                              ADL Overall ADL's : Needs assistance/impaired                                     Functional mobility during ADLs: Moderate assistance;+2 for physical assistance;Rolling walker General ADL Comments: Pt currently max assist for ADLs at this time secondary to level of arousal. Pt very lethargic and minimally participating in therapy session. Educated pt and family on back precautions and maintaining during functional activities.     Vision     Perception     Praxis      Pertinent Vitals/Pain Pain Assessment: Faces Faces Pain Scale: Hurts whole lot Pain Location: back Pain Descriptors / Indicators: Grimacing;Aching;Moaning Pain Intervention(s): Monitored during session;Repositioned;Premedicated before session     Hand Dominance     Extremity/Trunk Assessment Upper Extremity Assessment Upper Extremity Assessment: Generalized weakness;Difficult to assess due to impaired cognition   Lower Extremity Assessment Lower Extremity Assessment: Defer to PT evaluation   Cervical / Trunk Assessment Cervical / Trunk Assessment: Other exceptions Cervical / Trunk Exceptions: s/p back sx   Communication     Cognition Arousal/Alertness: Lethargic;Suspect due to medications Behavior During Therapy: Flat affect Overall Cognitive Status: Difficult to assess  General Comments       Exercises       Shoulder Instructions      Home Living Family/patient expects to be discharged to:: Private residence Living Arrangements: Children Available Help at Discharge: Available PRN/intermittently (son is out and about more than at home) Type of Home: Mobile home Home  Access: Stairs to enter [employer] of Steps: 2 Entrance Stairs-Rails: Left;Right Home Layout: One level     Bathroom Shower/Tub: Walk-in shower;Tub only   Bathroom Toilet: Handicapped height Bathroom Accessibility: Yes   Home Equipment: Grab bars - toilet;Grab bars - tub/shower;Walker - 2 wheels;Shower seat          Prior Functioning/Environment Level of Independence: Independent             OT Diagnosis: Generalized weakness;Acute pain   OT Problem List: Decreased strength;Decreased activity tolerance;Impaired balance (sitting and/or standing);Decreased safety awareness;Decreased knowledge of use of DME or AE;Decreased knowledge of precautions;Pain   OT Treatment/Interventions: Self-care/ADL training;Energy conservation;DME and/or AE instruction;Therapeutic activities;Patient/family education;Balance training    OT Goals(Current goals can be found in the care plan section) Acute Rehab OT Goals Patient Stated Goal: unable due to level of arousal OT Goal Formulation: Patient unable to participate in goal setting Time For Goal Achievement: 11/05/15 Potential to Achieve Goals: Good ADL Goals Pt Will Perform Grooming: with supervision;standing Pt Will Perform Lower Body Bathing: with supervision;sit to/from stand (with or without AE) Pt Will Perform Lower Body Dressing: with supervision;sit to/from stand (with or without AE) Pt Will Transfer to Toilet: with supervision;ambulating;bedside commode Pt Will Perform Toileting - Clothing Manipulation and hygiene: with supervision;sit to/from stand (with or without AE) Additional ADL Goal #1: Pt will verbally recall 3/3 back precautions and maintain throughout ADL. Additional ADL Goal #2: Pt will don/doff back brace with set up as precursor for ADLs and functional mobility.  OT Frequency: Min 2X/week   Barriers to D/C: Decreased caregiver support  family unable to provide reliable 24/7 supervision        Co-evaluation PT/OT/SLP Co-Evaluation/Treatment: Yes Reason for Co-Treatment: Complexity of the patient's impairments (multi-system involvement);For patient/therapist safety PT goals addressed during session: Mobility/safety with mobility OT goals addressed during session: Other (comment) (mobility)      End of Session Equipment Utilized During Treatment: Rolling walker;Back brace;Oxygen Nurse Communication: Mobility status  Activity Tolerance: Patient limited by lethargy;Patient limited by pain Patient left: in chair;with call bell/phone within reach;with chair alarm set;with family/visitor present   Time: [id number] OT Time Calculation (min): 30 min Charges:  OT General Charges $OT Visit: 1 Procedure OT Evaluation $OT Eval Moderate Complexity: 1 Procedure G-Codes:     Binnie Kand M.S., OTR/L Pager: [phone]  10/22/2015, 1:50 PM

## 2015-10-22 NOTE — Care Management Important Message (Signed)
Important Message  Patient Details  Name: [name] MRN: [id number] Date of Birth: [birth year]   Medicare Important Message Given:  Yes    [name] 10/22/2015, 9:49 AM

## 2015-10-22 NOTE — Progress Notes (Signed)
OT Cancellation Note  Patient Details Name: [name] MRN: [id number] DOB: [birth year]   Cancelled Treatment:    Reason Eval/Treat Not Completed: Pain limiting ability to participate;Fatigue/lethargy limiting ability to participate. Pt able to arouse to voice and touch but only able to maintain for a few seconds before falling back asleep. Pt reports significant pain and is declining to work with OT at this time. Will follow up for OT eval as time allows.  Binnie Kand M.S., OTR/L Pager: [phone]  10/22/2015, 8:03 AM

## 2015-10-22 NOTE — Progress Notes (Signed)
Report called to Nurse receiving the patient on 2H15.

## 2015-10-22 NOTE — Progress Notes (Signed)
Pharmacy Antibiotic Note  [name] is a 55 y.o. female admitted on 10/21/2015 with pneumonia.  Pharmacy has been consulted for Unasyn dosing.  Plan: Unasyn 3 grams iv Q 6 hours Pharmacy to sign off  Height: 5\' 2"  (157.5 cm) Weight: 160 lb (72.576 kg) IBW/kg (Calculated) : 50.1  Temp (24hrs), Avg:99.1 F (37.3 C), Min:97.7 F (36.5 C), Max:101.2 F (38.4 C)   Recent Labs Lab 10/19/15 0940  WBC 8.6  CREATININE 0.80    Estimated Creatinine Clearance: 75 mL/min (by C-G formula based on Cr of 0.8).    Allergies  Allergen Reactions  . Ibuprofen Swelling and Rash  . Prednisone Swelling and Rash  . Coconut Flavor Swelling and Rash  . Cyclobenzaprine Other (See Comments)    Reaction unknown  . Morphine Nausea And Vomiting     Thank you for allowing pharmacy to be a part of this patient's care. Anette Guarneri, PharmD [phone] 10/22/2015 3:21 PM

## 2015-10-22 NOTE — Progress Notes (Signed)
I was called to see [name] regarding swollen lip after surgery yesterday.  Chart reviewed. No difficulty noted with mask ventilation nor with intubation. No trauma noted with intubation.  On examination, right upper lip is swollen and upon palpation is tender and indurated. No laceration, bleeding, nor discharge noted. Lips moves symmetrically with smiling.  Discussed with patient possible causes of lip swelling. Expectant care with hopes this will resolve without sequelae. Ice or cool compress may be soothing. Patient will report any worsening of symptoms.

## 2015-10-22 NOTE — Progress Notes (Signed)
Of note, I did just reach out to [place].  I did talk with her on the phone.  She continues to sound extremely sedated and lethargic.  It is difficult to even understand her words.  She is with her sister, who states that she has never seen her like this.  As reflected in Dr. Gaynelle Adu consultation note, a workup is being performed.  I will continue to monitor the patient's progress and will be interested in any additional thoughts and recommendations provided by Dr. Eliseo Squires

## 2015-10-22 NOTE — Evaluation (Signed)
Physical Therapy Evaluation Patient Details Name: [name] MRN: [id number] DOB: 09-Oct-1960 Today's Date: 10/22/2015   History of Present Illness  pt is a 55 y/o female with h/o MI, HTN, CABG, DM, DDD, admitted with a R sided L4 radiculopathy and spondylolisthesis, s/p R TLIF and L PLA of L4/5 with fixation and allograft.  Clinical Impression  Pt admitted with/for lumbar fusion surgery.  Pt currently limited functionally due to the problems listed. ( See problems list.)   Pt will benefit from PT to maximize function and safety in order to get ready for next venue listed below.     Follow Up Recommendations SNF    Equipment Recommendations       Recommendations for Other Services       Precautions / Restrictions Precautions Precautions: Fall Required Braces or Orthoses: Spinal Brace Spinal Brace: Thoracolumbosacral orthotic;Applied in sitting position      Mobility  Bed Mobility Overal bed mobility: Needs Assistance Bed Mobility: Rolling;Sidelying to Sit Rolling: Mod assist Sidelying to sit: Mod assist       General bed mobility comments: cues for technique and assist throughout  Transfers Overall transfer level: Needs assistance   Transfers: Sit to/from Stand Sit to Stand: Mod assist;+2 physical assistance         General transfer comment: cues for hand placement  Ambulation/Gait Ambulation/Gait assistance: Min assist;+2 physical assistance;+2 safety/equipment Ambulation Distance (Feet): 60 Feet Assistive device: Rolling walker (2 wheeled) Gait Pattern/deviations: Step-through pattern;Decreased step length - right;Decreased step length - left;Decreased stride length Gait velocity: slow Gait velocity interpretation: Below normal speed for age/gender General Gait Details: generally unsteady throughout, mild instability at knees  Stairs Stairs: Yes          Wheelchair Mobility    Modified Rankin (Stroke Patients Only)       Balance  Overall balance assessment: No apparent balance deficits (not formally assessed)                                           Pertinent Vitals/Pain Pain Assessment: Faces Faces Pain Scale: Hurts whole lot Pain Location: back Pain Descriptors / Indicators: Aching;Sore;Grimacing Pain Intervention(s): Monitored during session;Repositioned    Home Living Family/patient expects to be discharged to:: Private residence Living Arrangements: Children Available Help at Discharge: Available PRN/intermittently (son is out and about more than at home) Type of Home: Mobile home Home Access: Stairs to enter Entrance Stairs-Rails: Chemical engineer of Steps: 2 Home Layout: One level Home Equipment: Grab bars - toilet;Grab bars - tub/shower;Walker - 2 wheels;Shower seat      Prior Function Level of Independence: Independent               Hand Dominance        Extremity/Trunk Assessment   Upper Extremity Assessment: Defer to OT evaluation           Lower Extremity Assessment: Generalized weakness         Communication      Cognition Arousal/Alertness: Lethargic;Suspect due to medications Behavior During Therapy: Flat affect Overall Cognitive Status: Difficult to assess                      General Comments General comments (skin integrity, edema, etc.): family instrcted in back care/prec, log roll and getting up/down from supine, lifting restrictions, progression of activity.    Exercises  Assessment/Plan    PT Assessment Patient needs continued PT services  PT Diagnosis Difficulty walking;Acute pain;Generalized weakness   PT Problem List Decreased strength;Decreased activity tolerance;Decreased balance;Decreased mobility;Decreased coordination;Decreased knowledge of use of DME;Decreased safety awareness;Pain  PT Treatment Interventions DME instruction;Gait training;Stair training;Functional mobility  training;Therapeutic activities;Balance training;Patient/family education   PT Goals (Current goals can be found in the Care Plan section) Acute Rehab PT Goals Patient Stated Goal: unable due to level of arousal PT Goal Formulation: Patient unable to participate in goal setting Time For Goal Achievement: 10/29/15 Potential to Achieve Goals: Good    Frequency Min 5X/week   Barriers to discharge Decreased caregiver support      Co-evaluation PT/OT/SLP Co-Evaluation/Treatment: Yes Reason for Co-Treatment: Complexity of the patient's impairments (multi-system involvement);For patient/therapist safety PT goals addressed during session: Mobility/safety with mobility         End of Session Equipment Utilized During Treatment: Back brace Activity Tolerance: Patient limited by lethargy;Patient limited by pain Patient left: in chair;with call bell/phone within reach;with family/visitor present;with chair alarm set Nurse Communication: Mobility status         Time: [id number] PT Time Calculation (min) (ACUTE ONLY): 38 min   Charges:   PT Evaluation $PT Eval Moderate Complexity: 1 Procedure PT Treatments $Therapeutic Activity: 8-22 mins   PT G Codes:        [name], [name] 10/22/2015, 12:04 PM 10/22/2015  [name], [name] [phone]  (pager)

## 2015-10-22 NOTE — Progress Notes (Signed)
Will monitor in SDU overnight-- x ray shows possible aspiration PNA- unasyn ordered.  Requiring O2.  Also asked nursing to enforce incentive spirometry.  Continue to monitor mental status-- pain meds d/c'd and basic labs have resulted.  Eulogio Bear DO

## 2015-10-23 DIAGNOSIS — R410 Disorientation, unspecified: Secondary | ICD-10-CM

## 2015-10-23 DIAGNOSIS — E119 Type 2 diabetes mellitus without complications: Secondary | ICD-10-CM

## 2015-10-23 DIAGNOSIS — I10 Essential (primary) hypertension: Secondary | ICD-10-CM

## 2015-10-23 DIAGNOSIS — F329 Major depressive disorder, single episode, unspecified: Secondary | ICD-10-CM

## 2015-10-23 LAB — GLUCOSE, CAPILLARY
GLUCOSE-CAPILLARY: 107 mg/dL — AB (ref 65–99)
Glucose-Capillary: 115 mg/dL — ABNORMAL HIGH (ref 65–99)

## 2015-10-23 MED ORDER — DULOXETINE HCL 60 MG PO CPEP
60.0000 mg | ORAL_CAPSULE | Freq: Every day | ORAL | Status: DC
  Administered 2015-10-24 – 2015-10-25 (×2): 60 mg via ORAL
  Filled 2015-10-23 (×2): qty 1

## 2015-10-23 MED ORDER — TRAZODONE HCL 50 MG PO TABS: 25.0000 mg | ORAL_TABLET | Freq: Every evening | ORAL | Status: DC | PRN

## 2015-10-23 MED ORDER — GABAPENTIN 100 MG PO CAPS
100.0000 mg | ORAL_CAPSULE | Freq: Three times a day (TID) | ORAL | Status: DC
  Administered 2015-10-23 – 2015-10-24 (×4): 100 mg via ORAL
  Filled 2015-10-23 (×4): qty 1

## 2015-10-23 MED ORDER — SODIUM CHLORIDE 0.9 % IV SOLN
INTRAVENOUS | Status: DC
  Administered 2015-10-23: 16:00:00 via INTRAVENOUS

## 2015-10-23 MED ORDER — NALOXONE HCL 0.4 MG/ML IJ SOLN: 0.1000 mg | Freq: Once | INTRAMUSCULAR | Status: DC

## 2015-10-23 MED ORDER — IPRATROPIUM-ALBUTEROL 0.5-2.5 (3) MG/3ML IN SOLN: 3.0000 mL | RESPIRATORY_TRACT | Status: DC | PRN

## 2015-10-23 NOTE — Progress Notes (Signed)
Spoke with Joanell Rising, Orthopedics PA this AM. As of 0800, patient alert and oriented to person, place, and time. Patient LOC has had a changed. Morning medications have been given an hour prior to contacting. Patient easy to arouse, however, is not oriented to time and place. Patient complains of being "out of it" and "very sleepy." Will continue to monitor. Joanell Rising advice RN at this time to hold evening Seroquel dosage. Gildardo Cranker, RN

## 2015-10-23 NOTE — Consult Note (Signed)
[facility] TEAM 1 - Stepdown/ICU TEAM CONSULT F/U NOTE  [name] [id number] DOB: 07/20/1960 DOA: 10/21/2015 PCP: [name], MD  Admit HPI / Brief Narrative: 55 y.o. female with history of chronic back pain, depression, DM, and bipolar d/o. She was admitted on 6/22 by Dr. Lynann Bologna for L4/L5 fusion. Nursing reports that the patient has been sleepy/sedated since the procedure. She is able to wake up and follow directions, work with PT etc but falls back to sleep very quickly. She was also had a single episode of fever early in the AM on 6/23. Her pulse ox on 2L was in the low 90s,  She had urinary retention that required I/O cath and then foley placement,  At baseline she is reportedly fully functional.  HPI/Subjective: Pt is arouseable, but lethargic.  She answerers questions and then quickly falls back to sleep.  She is in no apparent respiratory distress.  She denies cp, sob, n/v, or abdom pain.    Recommendations/Plan:  Altered mental status / encephalopathy Appears to be primarily due to sedating effects of meds - was better this morning, got meds, and then became lethargic again - though Seroquel is at her reported home dose, this is a very high dose and likely the primary culprit - stop or minimize all sedatives - suspect this will correct the issue rapidly   Fever - Bibasilar ATX v/s Aspiration Pneumonitis No longer febrile - encourage IS - complete 3 days of abx - wean O2 as able, but will not likely be successful until more alert   Hx of HTN Not an active issue, w/ relative hypotension while heavily sedated   DM2 CBG well controlled - no hypoglycemia - A1c 6.9  Posterior fusion L4-5 Per primary service (Ortrho/Spine)  Bipolar D/O - Depression Stop seroquel for now - if becomes more alert will plan to resume at much lower dose on 6/25  Code Status: FULL Family Communication: no family present at time of exam  Antibiotics: Cefazolin 6/22 Unasyn 6/23  >  DVT prophylaxis: SCDs  Objective: Blood pressure 90/52, pulse 71, temperature 98.2 F (36.8 C), temperature source Oral, resp. rate 17, height 5\' 2"  (1.575 m), weight 72.576 kg (160 lb), SpO2 94 %.  Intake/Output Summary (Last 24 hours) at 10/23/15 1333 Last data filed at 10/23/15 1012  Gross per 24 hour  Intake 1346.25 ml  Output   1950 ml  Net -603.75 ml   Exam: General: No acute respiratory distress - lethargic Lungs: Clear to auscultation bilaterally without wheezes or crackles Cardiovascular: Regular rate and rhythm without murmur gallop or rub normal S1 and S2 Abdomen: Nontender, nondistended, soft, bowel sounds positive, no rebound, no ascites, no appreciable mass Extremities: No significant cyanosis, clubbing, or edema bilateral lower extremities  Data Reviewed: Basic Metabolic Panel:  Recent Labs Lab 10/19/15 0940 10/22/15 1500  NA 140 136  K 4.1 4.1  CL 109 106  CO2 25 26  GLUCOSE 121* 112*  BUN 7 5*  CREATININE 0.80 0.75  CALCIUM 9.3 8.3*    CBC:  Recent Labs Lab 10/19/15 0940 10/22/15 1500  WBC 8.6 12.7*  NEUTROABS 4.8  --   HGB 15.0 12.7  HCT 46.0 40.4  MCV 91.6 92.4  PLT 182 155    Liver Function Tests:  Recent Labs Lab 10/19/15 0940  AST 14*  ALT 8*  ALKPHOS 91  BILITOT 0.4  PROT 6.6  ALBUMIN 3.7    Recent Labs Lab 10/22/15 1500  AMMONIA 19  Coags:  Recent Labs Lab 10/19/15 0940  INR 0.93    Recent Labs Lab 10/19/15 0940  APTT 32    CBG:  Recent Labs Lab 10/21/15 0833 10/21/15 1031 10/21/15 1705 10/22/15 1432 10/23/15 0732  GLUCAP 144* 125* 123* 117* 115*    Recent Results (from the past 240 hour(s))  Surgical pcr screen     Status: None   Collection Time: 10/19/15  9:40 AM  Result Value Ref Range Status   MRSA, PCR NEGATIVE NEGATIVE Final   Staphylococcus aureus NEGATIVE NEGATIVE Final    Comment:        The Xpert SA Assay (FDA approved for NASAL specimens in patients over 21 years of  age), is one component of a comprehensive surveillance program.  Test performance has been validated by [facility] for patients greater than or equal to 69 year old. It is not intended to diagnose infection nor to guide or monitor treatment.      Studies:   Recent x-ray studies have been reviewed in detail by the Attending Physician  Scheduled Meds:  Scheduled Meds: . ampicillin-sulbactam (UNASYN) IV  3 g Intravenous Q6H  . antiseptic oral rinse  7 mL Mouth Rinse BID  . buPROPion  150 mg Oral q morning - 10a  . carvedilol  3.125 mg Oral BID WC  . docusate sodium  100 mg Oral BID  . DULoxetine  60 mg Oral BID  . famotidine  20 mg Oral Daily  . gabapentin  400 mg Oral TID  . levothyroxine  75 mcg Oral QAC breakfast  . linagliptin  5 mg Oral Daily  . metFORMIN  1,000 mg Oral Q breakfast  . rosuvastatin  20 mg Oral Daily  . sodium chloride flush  3 mL Intravenous Q12H    Time spent on care of this patient: 35 mins   MCCLUNG,JEFFREY T , MD   [facility] Office  [phone] Pager - Text Page per Shea Evans as per below:  On-Call/Text Page:      [email or web address]      password [email or web address]  If 7PM-7AM, please contact night-coverage [email or web address] Password [email or web address] 10/23/2015, 1:33 PM   LOS: 2 days

## 2015-10-23 NOTE — Progress Notes (Signed)
Physical Therapy Treatment Patient Details Name: [name] MRN: [id number] DOB: Jul 04, 1960 Today's Date: 10/23/2015    History of Present Illness pt is a 55 y/o female with h/o MI, HTN, CABG, DM, DDD, admitted with a R sided L4 radiculopathy and spondylolisthesis, s/p R TLIF and L PLA of L4/5 with fixation and allograft.    PT Comments    Patient improving with mobility with less assist needed.  Still limited ambulation due to pain, but feel she will have support for d/c home when ready with follow up HHPT.   Follow Up Recommendations  Home health PT;Supervision/Assistance - 24 hour     Equipment Recommendations  None recommended by PT    Recommendations for Other Services       Precautions / Restrictions Precautions Precautions: Fall;Back Required Braces or Orthoses: Spinal Brace Spinal Brace: Thoracolumbosacral orthotic;Applied in sitting position    Mobility  Bed Mobility Overal bed mobility: Needs Assistance Bed Mobility: Rolling;Sidelying to Sit Rolling: Min guard Sidelying to sit: Min assist       General bed mobility comments: cues for technique, able to perform log roll with rail and increased time, assist for guidance; side to sit with pain and increased time, cues for progressing hand placement to avoid twisting  Transfers Overall transfer level: Needs assistance Equipment used: Rolling walker (2 wheeled) Transfers: Sit to/from Stand Sit to Stand: Min assist;+2 safety/equipment         General transfer comment: assist for balance. cues for technique  Ambulation/Gait Ambulation/Gait assistance: Min assist;+2 safety/equipment Ambulation Distance (Feet): 60 Feet Assistive device: Rolling walker (2 wheeled) Gait Pattern/deviations: Step-to pattern;Step-through pattern;Antalgic;Decreased stride length     General Gait Details: relates pain with R weightbearing up into butt cheek; slow halting with increased assist at times for walker  management   Stairs            Wheelchair Mobility    Modified Rankin (Stroke Patients Only)       Balance Overall balance assessment: Needs assistance Sitting-balance support: Bilateral upper extremity supported;Feet supported Sitting balance-Leahy Scale: Poor Sitting balance - Comments: able to sit unsupported, but increases back pain   Standing balance support: Bilateral upper extremity supported Standing balance-Leahy Scale: Poor Standing balance comment: UE assist for balance due to pain                    Cognition Arousal/Alertness: Awake/alert Behavior During Therapy: Flat affect Overall Cognitive Status: Impaired/Different from baseline Area of Impairment: Attention;Following commands   Current Attention Level: Sustained   Following Commands: Follows multi-step commands with increased time            Exercises      General Comments General comments (skin integrity, edema, etc.): muliple family members in the room; patient with minimal verbal responses      Pertinent Vitals/Pain Faces Pain Scale: Hurts whole lot Pain Location: back and R buttock Pain Descriptors / Indicators: Grimacing;Operative site guarding;Crying Pain Intervention(s): Monitored during session;Repositioned;Patient requesting pain meds-RN notified    Home Living                      Prior Function            PT Goals (current goals can now be found in the care plan section) Progress towards PT goals: Progressing toward goals    Frequency  Min 5X/week    PT Plan Discharge plan needs to be updated    Co-evaluation  End of Session Equipment Utilized During Treatment: Back brace Activity Tolerance: Patient limited by fatigue;Patient limited by pain Patient left: in chair;with call bell/phone within reach;with family/visitor present     Time: [id number] PT Time Calculation (min) (ACUTE ONLY): 16 min  Charges:  $Gait Training: 8-22  mins                    G Codes:      Reginia Naas 11/15/15, 10:06 AM  Magda Kiel, [place] 11-15-2015

## 2015-10-23 NOTE — Progress Notes (Signed)
PATIENT ID: [name]  MRN: [id number]  DOB/AGE:  [birth year] / 55 y.o.  2 Days Post-Op Procedure(s) (LRB): RIGHT SIDED LUMBAR 4-5 TRANSFORAMINAL LUMBAR INTERBODY FUSION WITH INSTRUMENTATION AND ALLOGRAFT (Right)    PROGRESS NOTE Subjective:   Patient is alert, oriented, no Nausea, no Vomiting, yes passing gas, no Bowel Movement. Taking PO with small bites. Denies SOB, Chest or Calf Pain. Using Incentive Spirometer, PAS in place. Ambulate WBAT , Patient reports pain as 10 on 0-10 scale, She continues to complain of low back pain, but states the leg pain has improved.    Objective: Vital signs in last 24 hours: Temp:  [98.1 F (36.7 C)-99.2 F (37.3 C)] 98.1 F (36.7 C) (06/24 0734) Pulse Rate:  [70-82] 70 (06/24 0734) Resp:  [14-25] 25 (06/24 0734) BP: (89-110)/(36-78) 98/65 mmHg (06/24 0734) SpO2:  [88 %-95 %] 93 % (06/24 0734)    Intake/Output from previous day: I/O last 3 completed shifts: In: 1136.3 [I.V.:836.3; IV Piggyback:300] Out: 2570 [Urine:2570]   Intake/Output this shift:     LABORATORY DATA:  Recent Labs  10/21/15 1705 10/22/15 1432 10/22/15 1500 10/23/15 0732  WBC  --   --  12.7*  --   HGB  --   --  12.7  --   HCT  --   --  40.4  --   PLT  --   --  155  --   NA  --   --  136  --   K  --   --  4.1  --   CL  --   --  106  --   CO2  --   --  26  --   BUN  --   --  5*  --   CREATININE  --   --  0.75  --   GLUCOSE  --   --  112*  --   GLUCAP 123* 117*  --  115*  CALCIUM  --   --  8.3*  --     Examination: Neurologically intact Neurovascular intact Sensation intact distally Intact pulses distally Dorsiflexion/Plantar flexion intact Incision: dressing C/D/I No cellulitis present Compartment soft} Pt able to resist PF and DF bilaterally.  Assessment:   2 Days Post-Op Procedure(s) (LRB): RIGHT SIDED LUMBAR 4-5 TRANSFORAMINAL LUMBAR INTERBODY FUSION WITH INSTRUMENTATION AND ALLOGRAFT (Right) ADDITIONAL DIAGNOSIS:  Urinary Retention,     Plan: - up with PT/OT, encourage ambulation, - will need to watch bladder function and scan plan to pull foley this AM - Percocet for pain, Valium for muscle spasms on D/C - likely d/c home tomorrow depending on progress, Pt has declined SNF - We appreciate Internal medicine's input with regards to pt's lethargy and continue to follow plan of care for possible pneumonia.  Sherard Sutch R 10/23/2015, 8:39 AM

## 2015-10-24 LAB — GLUCOSE, CAPILLARY
GLUCOSE-CAPILLARY: 95 mg/dL (ref 65–99)
Glucose-Capillary: 104 mg/dL — ABNORMAL HIGH (ref 65–99)
Glucose-Capillary: 101 mg/dL — ABNORMAL HIGH (ref 65–99)

## 2015-10-24 MED ORDER — GABAPENTIN 100 MG PO CAPS
200.0000 mg | ORAL_CAPSULE | Freq: Three times a day (TID) | ORAL | Status: DC
  Administered 2015-10-24 – 2015-10-25 (×2): 200 mg via ORAL
  Filled 2015-10-24 (×2): qty 2

## 2015-10-24 MED ORDER — QUETIAPINE FUMARATE 25 MG PO TABS
25.0000 mg | ORAL_TABLET | Freq: Three times a day (TID) | ORAL | Status: DC
  Administered 2015-10-24: 25 mg via ORAL
  Filled 2015-10-24 (×4): qty 1

## 2015-10-24 MED ORDER — METHOCARBAMOL 500 MG PO TABS
500.0000 mg | ORAL_TABLET | Freq: Four times a day (QID) | ORAL | Status: DC
  Administered 2015-10-24: 500 mg via ORAL
  Filled 2015-10-24 (×2): qty 1

## 2015-10-24 MED ORDER — OXYCODONE-ACETAMINOPHEN 5-325 MG PO TABS
1.0000 | ORAL_TABLET | Freq: Four times a day (QID) | ORAL | Status: DC | PRN
  Administered 2015-10-24 – 2015-10-25 (×5): 1 via ORAL
  Filled 2015-10-24 (×5): qty 1

## 2015-10-24 MED ORDER — GABAPENTIN 600 MG PO TABS: 300.0000 mg | ORAL_TABLET | Freq: Three times a day (TID) | ORAL | Status: DC

## 2015-10-24 MED ORDER — METHOCARBAMOL 500 MG PO TABS
500.0000 mg | ORAL_TABLET | Freq: Four times a day (QID) | ORAL | Status: DC
  Administered 2015-10-24 – 2015-10-25 (×2): 500 mg via ORAL
  Filled 2015-10-24 (×2): qty 1

## 2015-10-24 MED ORDER — LINAGLIPTIN 5 MG PO TABS
5.0000 mg | ORAL_TABLET | Freq: Every day | ORAL | Status: DC
  Administered 2015-10-25: 5 mg via ORAL
  Filled 2015-10-24: qty 1

## 2015-10-24 NOTE — Consult Note (Signed)
[facility] TEAM 1 - Stepdown/ICU TEAM CONSULT F/U NOTE  [name] [id number] DOB: [birth year] DOA: 10/21/2015 PCP: [name], MD  Admit HPI / Brief Narrative: 55 y.o. female with history of chronic back pain, depression, DM, and bipolar d/o. She was admitted on 6/22 by Dr. Lynann Bologna for L4/L5 fusion. Nursing reports that the patient has been sleepy/sedated since the procedure. She is able to wake up and follow directions, work with PT etc but falls back to sleep very quickly. She was also had a single episode of fever early in the AM on 6/23. Her pulse ox on 2L was in the low 90s,  She had urinary retention that required I/O cath and then foley placement,  At baseline she is reportedly fully functional.  HPI/Subjective: Dramatically improved today.  Alert oriented and conversant.  Complains of severely uncontrolled back pain.  Denies shortness of breath nausea or vomiting.  Recommendations/Plan:  Altered mental status / encephalopathy Appears to be primarily due to sedating effects of meds - was better this morning, got meds, and then became lethargic again - though Seroquel is at her reported home dose, this is a very high dose and likely the primary culprit - with dramatic improvement will resume Seroquel but at much lower dose than usual   Fever - Bibasilar ATX v/s Aspiration Pneumonitis No longer febrile - encourage IS - complete 3 days of abx - wean O2 to RA   HTN Blood pressure reasonably controlled at present  DM2 CBG well controlled - no hypoglycemia - A1c 6.9  Posterior fusion L4-5 Per primary service (Ortrho/Spine) - planning for discharge 6/26  Bipolar D/O - Depression Resume Seroquel today but at much lower dose than reportedly prescribed  Code Status: FULL Family Communication: Significant other at bedside  Antibiotics: Cefazolin 6/22 Unasyn 6/23 > 6/25  DVT prophylaxis: SCDs  Objective: Blood pressure 135/81, pulse 60, temperature 98.5 F  (36.9 C), temperature source Oral, resp. rate 20, height 5\' 2"  (1.575 m), weight 72.576 kg (160 lb), SpO2 96 %.  Intake/Output Summary (Last 24 hours) at 10/24/15 1545 Last data filed at 10/24/15 1000  Gross per 24 hour  Intake 1598.33 ml  Output   1050 ml  Net 548.33 ml   Exam: General: No acute respiratory distress - Alert and conversant Lungs: Clear to auscultation bilaterally  Cardiovascular: Regular rate and rhythm without murmur  Abdomen: Nontender, nondistended, soft, bowel sounds positive, no rebound, no ascites, no appreciable mass Extremities: No significant cyanosis, clubbing, edema bilateral lower extremities  Data Reviewed: Basic Metabolic Panel:  Recent Labs Lab 10/19/15 0940 10/22/15 1500  NA 140 136  K 4.1 4.1  CL 109 106  CO2 25 26  GLUCOSE 121* 112*  BUN 7 5*  CREATININE 0.80 0.75  CALCIUM 9.3 8.3*    CBC:  Recent Labs Lab 10/19/15 0940 10/22/15 1500  WBC 8.6 12.7*  NEUTROABS 4.8  --   HGB 15.0 12.7  HCT 46.0 40.4  MCV 91.6 92.4  PLT 182 155    Liver Function Tests:  Recent Labs Lab 10/19/15 0940  AST 14*  ALT 8*  ALKPHOS 91  BILITOT 0.4  PROT 6.6  ALBUMIN 3.7    Recent Labs Lab 10/22/15 1500  AMMONIA 19    Coags:  Recent Labs Lab 10/19/15 0940  INR 0.93    Recent Labs Lab 10/19/15 0940  APTT 32    CBG:  Recent Labs Lab 10/21/15 1705 10/22/15 1432 10/23/15 0732 10/23/15 2123 10/24/15 0724  GLUCAP  123* 117* 115* 107* 104*    Recent Results (from the past 240 hour(s))  Surgical pcr screen     Status: None   Collection Time: 10/19/15  9:40 AM  Result Value Ref Range Status   MRSA, PCR NEGATIVE NEGATIVE Final   Staphylococcus aureus NEGATIVE NEGATIVE Final    Comment:        The Xpert SA Assay (FDA approved for NASAL specimens in patients over 77 years of age), is one component of a comprehensive surveillance program.  Test performance has been validated by [facility] for patients greater than  or equal to 43 year old. It is not intended to diagnose infection nor to guide or monitor treatment.   Culture, blood (Routine X 2) w Reflex to ID Panel     Status: None (Preliminary result)   Collection Time: 10/22/15  3:00 PM  Result Value Ref Range Status   Specimen Description BLOOD RIGHT ASSIST CONTROL  Final   Special Requests IN PEDIATRIC BOTTLE 3CC  Final   Culture NO GROWTH 2 DAYS  Final   Report Status PENDING  Incomplete  Culture, blood (Routine X 2) w Reflex to ID Panel     Status: None (Preliminary result)   Collection Time: 10/22/15  3:05 PM  Result Value Ref Range Status   Specimen Description BLOOD RIGHT HAND  Final   Special Requests IN PEDIATRIC BOTTLE 2.5CC  Final   Culture NO GROWTH 2 DAYS  Final   Report Status PENDING  Incomplete     Studies:   Recent x-ray studies have been reviewed in detail by the Attending Physician  Scheduled Meds:  Scheduled Meds: . ampicillin-sulbactam (UNASYN) IV  3 g Intravenous Q6H  . antiseptic oral rinse  7 mL Mouth Rinse BID  . buPROPion  150 mg Oral q morning - 10a  . carvedilol  3.125 mg Oral BID WC  . docusate sodium  100 mg Oral BID  . DULoxetine  60 mg Oral Daily  . famotidine  20 mg Oral Daily  . gabapentin  100 mg Oral TID  . levothyroxine  75 mcg Oral QAC breakfast  . metFORMIN  1,000 mg Oral Q breakfast  . methocarbamol  500 mg Oral QID  . rosuvastatin  20 mg Oral Daily    Time spent on care of this patient: 25 mins   [facility] T , MD   [facility] Office  [phone] Pager - Text Page per Shea Evans as per below:  On-Call/Text Page:      [email or web address]      password [email or web address]  If 7PM-7AM, please contact night-coverage [email or web address] Password [email or web address] 10/24/2015, 3:45 PM   LOS: 3 days

## 2015-10-24 NOTE — Progress Notes (Signed)
Patient refused telemetry; removed telemetry box.  Educated with teach back the importance of telemetry with pre-existing cardiac history. Will continue to monitor.

## 2015-10-24 NOTE — Progress Notes (Signed)
PATIENT ID: [name]  MRN: [id number]  DOB/AGE:  1961-02-10 / 55 y.o.  3 Days Post-Op Procedure(s) (LRB): RIGHT SIDED LUMBAR 4-5 TRANSFORAMINAL LUMBAR INTERBODY FUSION WITH INSTRUMENTATION AND ALLOGRAFT (Right)    PROGRESS NOTE Subjective:   Patient is alert, oriented, no Nausea, no Vomiting, yes passing gas, no Bowel Movement. Taking PO with small bites. Denies SOB, Chest or Calf Pain. Using Incentive Spirometer, PAS in place. Ambulate WBAT with pt walking 60 ft with therapy, Patient reports pain as 9 on 0-10 scale,     Objective: Vital signs in last 24 hours: Temp:  [97.7 F (36.5 C)-99.3 F (37.4 C)] 98.1 F (36.7 C) (06/25 0725) Pulse Rate:  [64-133] 66 (06/25 0725) Resp:  [15-25] 20 (06/25 0725) BP: (90-133)/(52-85) 133/75 mmHg (06/25 0725) SpO2:  [92 %-99 %] 99 % (06/25 0725)    Intake/Output from previous day: I/O last 3 completed shifts: In: 2304.6 [P.O.:170; I.V.:1534.6; IV Piggyback:600] Out: 2400 [Urine:2400]   Intake/Output this shift:     LABORATORY DATA:  Recent Labs  10/22/15 1432 10/22/15 1500 10/23/15 0732 10/23/15 2123  WBC  --  12.7*  --   --   HGB  --  12.7  --   --   HCT  --  40.4  --   --   PLT  --  155  --   --   NA  --  136  --   --   K  --  4.1  --   --   CL  --  106  --   --   CO2  --  26  --   --   BUN  --  5*  --   --   CREATININE  --  0.75  --   --   GLUCOSE  --  112*  --   --   GLUCAP 117*  --  115* 107*  CALCIUM  --  8.3*  --   --     Examination: Neurologically intact Neurovascular intact Sensation intact distally Intact pulses distally Dorsiflexion/Plantar flexion intact Incision: dressing C/D/I No cellulitis present Compartment soft}  Assessment:   3 Days Post-Op Procedure(s) (LRB): RIGHT SIDED LUMBAR 4-5 TRANSFORAMINAL LUMBAR INTERBODY FUSION WITH INSTRUMENTATION AND ALLOGRAFT (Right) ADDITIONAL DIAGNOSIS:   Plan: - up with PT/OT, encourage ambulation -  Restart Percocet for pain, robaxin for muscle  spasms on D/C - likely d/c home tomorrow depending on progress, Pt has declined SNF - We appreciate Internal medicine's input with regards to pt's lethargy and continue to follow plan of care for possible pneumonia.     PHILLIPS, ERIC R 10/24/2015, 8:21 AM

## 2015-10-24 NOTE — Progress Notes (Signed)
Patient arrived to unit 5N.

## 2015-10-24 NOTE — Progress Notes (Signed)
Physical Therapy Treatment Patient Details Name: [name] MRN: [id number] DOB: [birth year] Today's Date: 10/24/2015    History of Present Illness pt is a 55 y/o female with h/o MI, HTN, CABG, DM, DDD, admitted with a R sided L4 radiculopathy and spondylolisthesis, s/p R TLIF and L PLA of L4/5 with fixation and allograft.    PT Comments    Continuing progress with functional mobility and activity tolerance; needs reinforcement of back prec;   Pt tells me it is likely she will dc to home tomorrow, and this is not unreasonable; Husband present for PT session and education;   We discussed car transfers as well  Follow Up Recommendations  Home health PT;Supervision/Assistance - 24 hour     Equipment Recommendations  Rolling walker with 5" wheels;3in1 (PT) (May already have)    Recommendations for Other Services       Precautions / Restrictions Precautions Precautions: Fall;Back Precaution Comments: Reviewed precautions with pt and family Required Braces or Orthoses: Spinal Brace Spinal Brace: Thoracolumbosacral orthotic;Applied in sitting position Restrictions Weight Bearing Restrictions: No    Mobility  Bed Mobility Overal bed mobility: Needs Assistance Bed Mobility: Rolling;Sidelying to Sit Rolling: Min guard Sidelying to sit: Min assist       General bed mobility comments: cues for technique, able to perform log roll with rail and increased time, assist for guidance; side to sit with pain and increased time, cues for progressing hand placement to avoid twisting  Transfers Overall transfer level: Needs assistance Equipment used: Rolling walker (2 wheeled) Transfers: Sit to/from Stand Sit to Stand: Min assist;+2 safety/equipment         General transfer comment: assist for balance. cues for technique and back precautions  Ambulation/Gait Ambulation/Gait assistance: Min assist;+2 safety/equipment Ambulation Distance (Feet): 70 Feet Assistive device:  Rolling walker (2 wheeled) Gait Pattern/deviations: Step-to pattern;Decreased stance time - right;Decreased step length - left Gait velocity: slow   General Gait Details: Continued RLE pain with weight bearing; Cues to use RW to unweigh painful RLE in stance; cues also for gait sequence, needing min assist at times to advance RW   Stairs            Wheelchair Mobility    Modified Rankin (Stroke Patients Only)       Balance     Sitting balance-Leahy Scale: Fair Sitting balance - Comments: able to sit unsupported, but increases back pain     Standing balance-Leahy Scale: Poor Standing balance comment: UE assist for balance due to pain                    Cognition Arousal/Alertness: Awake/alert Behavior During Therapy: Flat affect Overall Cognitive Status: Impaired/Different from baseline Area of Impairment: Attention               General Comments: very internally distracted by pain    Exercises      General Comments        Pertinent Vitals/Pain Pain Assessment: Faces Faces Pain Scale: Hurts whole lot Pain Location: Back pain and R LE pain Pain Descriptors / Indicators: Aching;Discomfort;Grimacing;Sore (Unable to put full weight on RLE) Pain Intervention(s): Monitored during session    [place] expects to be discharged to:: Private residence Living Arrangements: Spouse/significant other                  Prior Function            PT Goals (current goals can now be found in the  care plan section) Acute Rehab PT Goals Time For Goal Achievement: 10/29/15 Potential to Achieve Goals: Good Progress towards PT goals: Progressing toward goals    Frequency  Min 5X/week    PT Plan Discharge plan needs to be updated    Co-evaluation             End of Session Equipment Utilized During Treatment: Back brace Activity Tolerance: Patient tolerated treatment well;Patient limited by pain Patient left: in chair;with  call bell/phone within reach;with chair alarm set     Time: [id number] PT Time Calculation (min) (ACUTE ONLY): 18 min  Charges:  $Gait Training: 8-22 mins                    G Codes:      Quin Hoop 10/24/2015, 3:32 PM  Roney Marion, [place] Pager [phone] Office [phone]

## 2015-10-25 LAB — GLUCOSE, CAPILLARY
Glucose-Capillary: 103 mg/dL — ABNORMAL HIGH (ref 65–99)
Glucose-Capillary: 98 mg/dL (ref 65–99)

## 2015-10-25 MED ORDER — QUETIAPINE FUMARATE 25 MG PO TABS: 25.0000 mg | ORAL_TABLET | Freq: Three times a day (TID) | ORAL | Status: DC

## 2015-10-25 NOTE — Progress Notes (Signed)
Physical Therapy Treatment Patient Details Name: [name] MRN: [id number] DOB: [birth year] Today's Date: 10/25/2015    History of Present Illness pt is a 55 y/o female with h/o MI, HTN, CABG, DM, DDD, admitted with a R sided L4 radiculopathy and spondylolisthesis, s/p R TLIF and L PLA of L4/5 with fixation and allograft.    PT Comments    Pt progressing well towards physical therapy goals. Pt was able to demonstrate functional mobility with min guard to supervision for safety and use of RW. She anticipates d/c home this morning and states she has all needed equipment at home. Will continue to follow until d/c.   Follow Up Recommendations  Home health PT;Supervision/Assistance - 24 hour     Equipment Recommendations  Rolling walker with 5" wheels;3in1 (PT) (May already have)    Recommendations for Other Services       Precautions / Restrictions Precautions Precautions: Fall;Back Precaution Comments: Reviewed precautions with pt and family Required Braces or Orthoses: Spinal Brace Spinal Brace: Thoracolumbosacral orthotic;Applied in sitting position Restrictions Weight Bearing Restrictions: No    Mobility  Bed Mobility               General bed mobility comments: Pt received in bathroom   Transfers Overall transfer level: Needs assistance Equipment used: Rolling walker (2 wheeled) Transfers: Sit to/from Stand Sit to Stand: Supervision         General transfer comment: Close supervision for safety. Pt demonstrated proper hand placement on seated surface for safety.   Ambulation/Gait Ambulation/Gait assistance: Min guard;Supervision Ambulation Distance (Feet): 200 Feet Assistive device: Rolling walker (2 wheeled) Gait Pattern/deviations: Step-through pattern;Decreased stride length Gait velocity: Decreased Gait velocity interpretation: Below normal speed for age/gender General Gait Details: Pt reports she is able to improve WB through RLE this  session. VC's for step-through gait pattern and pt was able to progress from min guard assist to supervision for safety.    Stairs            Wheelchair Mobility    Modified Rankin (Stroke Patients Only)       Balance Overall balance assessment: No apparent balance deficits (not formally assessed)                                  Cognition Arousal/Alertness: Awake/alert Behavior During Therapy: WFL for tasks assessed/performed Overall Cognitive Status: Within Functional Limits for tasks assessed                      Exercises      General Comments        Pertinent Vitals/Pain Pain Assessment: Faces Faces Pain Scale: Hurts a little bit Pain Location: Incision site when sitting down to recliner Pain Descriptors / Indicators: Operative site guarding;Sore Pain Intervention(s): Limited activity within patient's tolerance;Monitored during session;Repositioned    Home Living                      Prior Function            PT Goals (current goals can now be found in the care plan section) Acute Rehab PT Goals Patient Stated Goal: Home this morning PT Goal Formulation: With patient/family Time For Goal Achievement: 10/29/15 Potential to Achieve Goals: Good Progress towards PT goals: Progressing toward goals    Frequency  Min 5X/week    PT Plan Current plan remains appropriate  Co-evaluation             End of Session Equipment Utilized During Treatment: Gait belt;Back brace Activity Tolerance: Patient tolerated treatment well;Patient limited by pain Patient left: in chair;with call bell/phone within reach;with family/visitor present     Time: 0810-0839 PT Time Calculation (min) (ACUTE ONLY): 29 min  Charges:  $Gait Training: 23-37 mins                    G Codes:      Rolinda Roan 2015/11/18, 10:33 AM   Rolinda Roan, PT, DPT Acute Rehabilitation Services Pager: [phone]

## 2015-10-25 NOTE — Progress Notes (Signed)
    Patient doing well Mental status improved Has been ambulating today Pain better controlled R leg pain improved   Physical Exam: Filed Vitals:   10/24/15 2015 10/25/15 0635  BP: 101/38 125/56  Pulse: 73 59  Temp: 98.8 F (37.1 C) 99.2 F (37.3 C)  Resp: 18 17    Dressing in place NVI  POD #4 s/p L4/5 decompression and fusion  - up with PT/OT, encourage ambulation - Percocet for pain, Robaxin for muscle spasms - likely d/c home later today with f/u in 1.5 weeks

## 2015-10-25 NOTE — Progress Notes (Signed)
Occupational Therapy Treatment Patient Details Name: [name] MRN: [id number] DOB: 08-22-1960 Today's Date: 10/25/2015    History of present illness pt is a 55 y/o female with h/o MI, HTN, CABG, DM, DDD, admitted with a R sided L4 radiculopathy and spondylolisthesis, s/p R TLIF and L PLA of L4/5 with fixation and allograft.   OT comments  Pt making good progress with functional goals. Pt's spouse independent with donning back brace and will be with pt 24/7 to assist with ADLs per pt and pt's spouse. Pt scheduled to d/c home today  Follow Up Recommendations  No OT follow up;Supervision/Assistance - 24 hour    Equipment Recommendations   (pt not interested in ADL A/E kit for LB ADLs)    Recommendations for Other Services      Precautions / Restrictions Precautions Precautions: Fall;Back Precaution Comments: Pt able to recall 2/3 back precautions, reviewed back precautions with pt and her husband Required Braces or Orthoses: Spinal Brace Spinal Brace: Thoracolumbosacral orthotic;Applied in sitting position Restrictions Weight Bearing Restrictions: No       Mobility Bed Mobility Overal bed mobility: Needs Assistance Bed Mobility: Sidelying to Sit;Rolling;Sit to Sidelying Rolling: Supervision Sidelying to sit: Supervision Supine to sit: Supervision     General bed mobility comments: good log roll technique  Transfers Overall transfer level: Needs assistance Equipment used: Rolling walker (2 wheeled) Transfers: Sit to/from Stand Sit to Stand: Supervision         General transfer comment: Close supervision for safety. Pt demonstrated proper hand placement on seated surface for safety.     Balance Overall balance assessment: No apparent balance deficits (not formally assessed) Sitting-balance support: No upper extremity supported;Feet supported Sitting balance-Leahy Scale: Good     Standing balance support: During functional activity Standing  balance-Leahy Scale: Fair                     ADL Overall ADL's : Needs assistance/impaired     Grooming: Wash/dry hands;Wash/dry face;Min guard;Standing       Lower Body Bathing: Moderate assistance;With caregiver independent assisting       Lower Body Dressing: Moderate assistance;With caregiver independent assisting   Toilet Transfer: Supervision/safety;RW;Regular Toilet;Grab bars;With caregiver independent assisting   Toileting- Water quality scientist and Hygiene: Min guard;Sit to/from stand   Tub/ Banker: 3 in 1;Rolling walker   Functional mobility during ADLs: Supervision/safety;Rolling walker General ADL Comments: had pt's husband to donn back brace once pt in sitting positon form supine in bed.       Vision  no change from baseline                              Cognition   Behavior During Therapy: WFL for tasks assessed/performed Overall Cognitive Status: Within Functional Limits for tasks assessed                       Extremity/Trunk Assessment   [facility]            Exercises     Shoulder Instructions       General Comments  pt pleasant and cooperative    Pertinent Vitals/ Pain       Pain Assessment: 0-10 Pain Score: 2  Faces Pain Scale: Hurts a little bit Pain Location: incision site with bed mobility and sit - stand Pain Descriptors / Indicators: Sore;Guarding;Grimacing Pain Intervention(s): Monitored during session;Repositioned  Home Living  with spouse  Prior Functioning/Environment  independent           Frequency Min 2X/week     Progress Toward Goals  OT Goals(current goals can now be found in the care plan section)  Progress towards OT goals: Progressing toward goals  Acute Rehab OT Goals Patient Stated Goal: Home this morning  Plan Frequency remains appropriate                     End of Session Equipment Utilized During  Treatment: Rolling walker;Back brace;Other (comment) (3 in1 )   Activity Tolerance Patient tolerated treatment well   Patient Left in bed;with call bell/phone within reach;with family/visitor present             Time: 1224-4975 OT Time Calculation (min): 13 min  Charges: OT General Charges $OT Visit: 1 Procedure OT Treatments $Therapeutic Activity: 8-22 mins  Britt Bottom 10/25/2015, 12:53 PM

## 2015-10-25 NOTE — Care Management Important Message (Signed)
Important Message  Patient Details  Name: [name] MRN: [id number] Date of Birth: 1960-11-30   Medicare Important Message Given:  Yes    [name] 10/25/2015, 8:59 AM

## 2015-10-25 NOTE — Consult Note (Signed)
  [place] TEAM 1 - Stepdown/ICU TEAM  Chart reviewed.  Noted pt remains back to normal mental status baseline, as noted at time of my exam yesterday.  Noted Ortho plan for d/c home today.    Antibiotic course completed.  Afebrile. VSS  Suggest the following for d/c home: -resume usual home DM medications  -resume usual home HTN medications -have her contact her usual psychiatric healthcare provider for advice regarding dosing of seroquel at home - my suggestion for now would be to d/c home on 50mg  TID   Thank you for your consult.  Please call for questions should any arise prior to her pending d/c home today.  Cherene Altes, MD [facility] Office  [phone] Pager - Text Page per Amion as per below:  On-Call/Text Page:      [email or web address]      password [email or web address]  If 7PM-7AM, please contact night-coverage [email or web address] Password [email or web address] 10/25/2015, 9:02 AM \

## 2015-10-25 NOTE — Care Management Note (Signed)
Case Management Note  Patient Details  Name: [name] MRN: [id number] Date of Birth: 03/10/1961  Subjective/Objective:    55 yr old female s/p L4-L5 fusion.                  Action/Plan: Case manager spoke with patient concerning home health needs. Choice of home health agency was provided. Patient requested [facility]. CM called referral to Santiago Glad, [facility] Liaison.    Expected Discharge Date:    10/25/15              Expected Discharge Plan:  [place]  In-House Referral:     Discharge planning Services  CM Consult  Post Acute Care Choice:  NA, Home Health Choice offered to:  Patient  DME Arranged:  [facility], [facility] DME Agency:     HH Arranged:  PT [place] Agency:  [place]  Status of Service:  Completed, signed off  If discussed at [place] of Stay Meetings, dates discussed:    Additional Comments:  Ninfa Meeker, RN 10/25/2015, 10:39 AM

## 2015-10-26 ENCOUNTER — Telehealth: Payer: Self-pay

## 2015-10-26 NOTE — Telephone Encounter (Signed)
Pt is at home recovering from back surgery and has developed a cough and is needing something called in   Best number [phone]

## 2015-10-27 DIAGNOSIS — I2510 Atherosclerotic heart disease of native coronary artery without angina pectoris: Secondary | ICD-10-CM | POA: Diagnosis not present

## 2015-10-27 DIAGNOSIS — E119 Type 2 diabetes mellitus without complications: Secondary | ICD-10-CM | POA: Diagnosis not present

## 2015-10-27 DIAGNOSIS — I252 Old myocardial infarction: Secondary | ICD-10-CM | POA: Diagnosis not present

## 2015-10-27 DIAGNOSIS — M4316 Spondylolisthesis, lumbar region: Secondary | ICD-10-CM | POA: Diagnosis not present

## 2015-10-27 DIAGNOSIS — R2689 Other abnormalities of gait and mobility: Secondary | ICD-10-CM | POA: Diagnosis not present

## 2015-10-27 DIAGNOSIS — J22 Unspecified acute lower respiratory infection: Secondary | ICD-10-CM | POA: Diagnosis not present

## 2015-10-27 DIAGNOSIS — I10 Essential (primary) hypertension: Secondary | ICD-10-CM | POA: Diagnosis not present

## 2015-10-27 DIAGNOSIS — F319 Bipolar disorder, unspecified: Secondary | ICD-10-CM | POA: Diagnosis not present

## 2015-10-27 DIAGNOSIS — Z4789 Encounter for other orthopedic aftercare: Secondary | ICD-10-CM | POA: Diagnosis not present

## 2015-10-27 DIAGNOSIS — M6281 Muscle weakness (generalized): Secondary | ICD-10-CM | POA: Diagnosis not present

## 2015-10-27 LAB — CULTURE, BLOOD (ROUTINE X 2)
Culture: NO GROWTH
Culture: NO GROWTH

## 2015-10-27 NOTE — Telephone Encounter (Signed)
Dr. Brigitte Pulse would you consider calling something in?

## 2015-10-27 NOTE — Telephone Encounter (Signed)
Spoke with pt, she states

## 2015-10-27 NOTE — Progress Notes (Signed)
Of note, a posterolateral fusion was performed with allograft and autograft placed into the posterolateral gutter on the left side

## 2015-10-27 NOTE — Progress Notes (Signed)
Of note, the patient did experience an altered mental status due to Seroquel usage, per the medicine team

## 2015-10-27 NOTE — Discharge Summary (Signed)
Patient ID: [name] MRN: [id number] DOB/AGE: [birth year] 55 y.o.  Admit date: 10/21/2015 Discharge date: 10/25/2015  Admission Diagnoses:  Active Problems:   Depression   Hypertension   Diabetes [facility])   Radiculopathy   Discharge Diagnoses:  Same  Past Medical History  Diagnosis Date  . Back pain, chronic   . Depression   . Myocardial infarction ([place]) 11/20/06  . Hypertension   . Hypercholesteremia   . Coronary artery disease   . Allergy   . Arthritis   . Neuromuscular disorder ([place])   . Bronchitis     hx-no inhalers at home  . Diabetes mellitus     diet controlled  . DDD (degenerative disc disease), lumbar   . Ejection fraction     .  [name] Hx of CABG   . Carotid bruit   . Nodule of neck   . Multiple thyroid nodules   . Hypothyroidism   . Anxiety   . GERD (gastroesophageal reflux disease)   . Bipolar disorder ([place])   . Carpal tunnel syndrome on both sides     Surgeries: Procedure(s): RIGHT SIDED LUMBAR 4-5 TRANSFORAMINAL LUMBAR INTERBODY FUSION WITH INSTRUMENTATION AND ALLOGRAFT on 10/21/2015   Consultants: Treatment Team:  Sherald Barge, MD for lethargy. Resolved.   Discharged Condition: Improved  Hospital Course: [name] is an 55 y.o. female who was admitted 10/21/2015 for operative treatment of radiculopathy. Patient has severe unremitting pain that affects sleep, daily activities, and work/hobbies. After pre-op clearance the patient was taken to the operating room on 10/21/2015 and underwent  Procedure(s): RIGHT SIDED LUMBAR 4-5 TRANSFORAMINAL LUMBAR INTERBODY FUSION WITH INSTRUMENTATION AND ALLOGRAFT.    Patient was given perioperative antibiotics:  Anti-infectives    Start     Dose/Rate Route Frequency Ordered Stop   10/22/15 1600  Ampicillin-Sulbactam (UNASYN) 3 g in sodium chloride 0.9 % 100 mL IVPB     3 g 100 mL/hr over 60 Minutes Intravenous Every 6 hours 10/22/15 1520 10/24/15 2000   10/21/15 2000  ceFAZolin (ANCEF)  IVPB 1 g/50 mL premix     1 g 100 mL/hr over 30 Minutes Intravenous Every 8 hours 10/21/15 1842 10/22/15 0500   10/21/15 1200  ceFAZolin (ANCEF) IVPB 2g/100 mL premix     2 g 200 mL/hr over 30 Minutes Intravenous To [facility] 10/20/15 1307 10/21/15 1154       Patient was given sequential compression devices, early ambulation to prevent DVT.  Patient benefited maximally from hospital stay and there were no complications except lethargy secondary to medications, IM was consulted and altered Seroquel dose, pt returned to baseline without issue prior to D/C.    Recent vital signs: BP 125/56 mmHg  Pulse 59  Temp(Src) 99.2 F (37.3 C) (Oral)  Resp 17  Ht 5\' 2"  (1.575 m)  Wt 72.576 kg (160 lb)  BMI 29.26 kg/m2  SpO2 92%  Discharge Medications:     Medication List    TAKE these medications        ACCU-CHEK AVIVA PLUS test strip  Generic drug:  glucose blood  Use to check blood sugar 3 times daily.     aspirin EC 81 MG tablet  Take 81 mg by mouth 2 (two) times daily.     buPROPion 150 MG 12 hr tablet  Commonly known as:  WELLBUTRIN SR  Take 150 mg by mouth every morning.     carvedilol 3.125 MG tablet  Commonly known as:  COREG  TAKE 1 TABLET (  3.125 MG TOTAL) BY MOUTH 2 (TWO) TIMES DAILY.     diazepam 5 MG tablet  Commonly known as:  VALIUM  TAKE 1 TABLET BY MOUTH EVERY TWELVE HOURS AS NEEDED FOR MUSCLE SPASM OR SEDATION     DULoxetine 60 MG capsule  Commonly known as:  CYMBALTA  Take 60 mg by mouth 2 (two) times daily.     gabapentin 400 MG capsule  Commonly known as:  NEURONTIN  Take 400 mg by mouth 3 (three) times daily.     Ipratropium-Albuterol 20-100 MCG/ACT Aers respimat  Commonly known as:  COMBIVENT RESPIMAT  Inhale 1 puff into the lungs every 6 (six) hours.     levothyroxine 75 MCG tablet  Commonly known as:  SYNTHROID, LEVOTHROID  TAKE 1 TABLET (75 MCG TOTAL) BY MOUTH DAILY BEFORE BREAKFAST.     metFORMIN 500 MG 24 hr tablet  Commonly known  as:  GLUCOPHAGE-XR  Take 2 tablets (1,000 mg total) by mouth daily with breakfast.     oxyCODONE-acetaminophen 10-325 MG tablet  Commonly known as:  PERCOCET  Take 1 tablet by mouth every 6 (six) hours as needed for pain.     ranitidine 150 MG tablet  Commonly known as:  ZANTAC  Take 1 tablet (150 mg total) by mouth 2 (two) times daily.     rosuvastatin 20 MG tablet  Commonly known as:  CRESTOR  Take 20 mg by mouth daily.     SEROQUEL 200 MG tablet  Generic drug:  QUEtiapine  Take 200 mg by mouth 3 (three) times daily.     QUEtiapine 25 MG tablet  Commonly known as:  SEROQUEL  Take 1 tablet (25 mg total) by mouth 3 (three) times daily.     sitaGLIPtin 50 MG tablet  Commonly known as:  JANUVIA  Take 50 mg by mouth daily.     traZODone 50 MG tablet  Commonly known as:  DESYREL  Take 50 mg by mouth at bedtime as needed for sleep.        Diagnostic Studies: Dg Chest 2 View  10/19/2015  CLINICAL DATA:  Back surgery. EXAM: CHEST  2 VIEW COMPARISON:  Eighteen 2015. FINDINGS: Prior CABG. Heart size normal. No focal pulmonary infiltrate. No pleural effusion or pneumothorax. Degenerative changes thoracic spine. IMPRESSION: No acute cardiopulmonary disease. Electronically Signed   By: Marcello Moores  Register   On: 10/19/2015 10:08   Dg Lumbar Spine 2-3 Views  10/21/2015  CLINICAL DATA:  TLIF L4-5, right sided Fluoro time was 61 seconds EXAM: LUMBAR SPINE - 2-3 VIEW; DG C-ARM 61-120 MIN COMPARISON:  10/21/2015 FINDINGS: Numbering is based on previous imaging. Patient has undergone posterior fusion L4 -L5 with interbody fusion device. IMPRESSION: Status post posterior fusion L4-5. Electronically Signed   By: Nolon Nations M.D.   On: 10/21/2015 15:44   US Transvaginal Non-ob  10/02/2015  CLINICAL DATA:  Left-sided pelvic pain with nausea for 2 weeks EXAM: TRANSABDOMINAL AND TRANSVAGINAL ULTRASOUND OF PELVIS TECHNIQUE: Study was performed transabdominally prominent pelvic field of view  evaluation and transvaginally to optimize internal visceral architecture evaluation. COMPARISON:  None FINDINGS: Uterus Surgically absent.  Vaginal cuff appears unremarkable. Right ovary Unable to visualize by transabdominal or transvaginal technique. No right-sided pelvic mass identified. Left ovary Unable to visualize by transabdominal or transvaginal technique. No left-sided pelvic mass identified. Other findings No abnormal free fluid. IMPRESSION: Uterus absent. Unable to visualize either ovary by transabdominal or transvaginal technique. This circumstance may indicate relative atrophy of the ovaries. There  is also moderate bowel gas throughout the pelvis. There is no sonographically demonstrable pelvic mass or inflammatory focus. No free pelvic fluid evident. Vaginal cuff appears unremarkable. Electronically Signed   By: Lowella Grip III M.D.   On: 10/02/2015 15:17   US Pelvis Complete  10/02/2015  CLINICAL DATA:  Left-sided pelvic pain with nausea for 2 weeks EXAM: TRANSABDOMINAL AND TRANSVAGINAL ULTRASOUND OF PELVIS TECHNIQUE: Study was performed transabdominally prominent pelvic field of view evaluation and transvaginally to optimize internal visceral architecture evaluation. COMPARISON:  None FINDINGS: Uterus Surgically absent.  Vaginal cuff appears unremarkable. Right ovary Unable to visualize by transabdominal or transvaginal technique. No right-sided pelvic mass identified. Left ovary Unable to visualize by transabdominal or transvaginal technique. No left-sided pelvic mass identified. Other findings No abnormal free fluid. IMPRESSION: Uterus absent. Unable to visualize either ovary by transabdominal or transvaginal technique. This circumstance may indicate relative atrophy of the ovaries. There is also moderate bowel gas throughout the pelvis. There is no sonographically demonstrable pelvic mass or inflammatory focus. No free pelvic fluid evident. Vaginal cuff appears unremarkable. Electronically  Signed   By: Lowella Grip III M.D.   On: 10/02/2015 15:17   Dg Lumbar Spine 1 View  10/21/2015  CLINICAL DATA:  Right L4-5 transforaminal lumbar interbody fusion. EXAM: LUMBAR SPINE - 1 VIEW COMPARISON:  12/17/2014 lumbar spine MRI FINDINGS: Portable cross-table lateral intraoperative lumbar spine radiograph demonstrates 2 posterior approach surgical marking devices with the tip of the more superior device overlying the lower L2 spinous process and the tip of the more inferior device in the posterior soft tissues between the L4 and L5 spinous processes. Mild-to-moderate lumbar spondylosis. IMPRESSION: Posterior approach surgical marking device positions as described. Electronically Signed   By: Ilona Sorrel M.D.   On: 10/21/2015 13:40   Dg Chest Port 1 View  10/22/2015  CLINICAL DATA:  55 year old female with fever after lumbar spine surgery yesterday. Hypoxia. Initial encounter. EXAM: PORTABLE CHEST 1 VIEW COMPARISON:  10/19/2015. FINDINGS: Portable AP semi upright view at 1456 hours. Lower lung volumes. Increased interstitial and patchy bibasilar pulmonary opacity. No pneumothorax. No definite pleural effusion. Stable mediastinal contours allowing for lower lung volumes. Sequelae of CABG. Negative visible bowel gas pattern. IMPRESSION: Lower lung volumes with increased interstitial and patchy bibasilar opacity. Top differential considerations in this setting include pulmonary interstitial edema with atelectasis versus lung base aspiration pneumonia. No pleural effusion identified. Electronically Signed   By: Genevie Ann M.D.   On: 10/22/2015 15:05   Dg C-arm 1-60 Min  10/21/2015  CLINICAL DATA:  TLIF L4-5, right sided Fluoro time was 61 seconds EXAM: LUMBAR SPINE - 2-3 VIEW; DG C-ARM 61-120 MIN COMPARISON:  10/21/2015 FINDINGS: Numbering is based on previous imaging. Patient has undergone posterior fusion L4 -L5 with interbody fusion device. IMPRESSION: Status post posterior fusion L4-5. Electronically  Signed   By: Nolon Nations M.D.   On: 10/21/2015 15:44    Disposition: 01-Home or Self Care   POD #4 s/p L4/5 decompression and fusion  -Written scripts for pain signed and in chart -D/C instructions sheet printed and in chart -D/C today  -F/U in office 2 weeks   Signed: Justice Britain 10/27/2015, 12:30 PM

## 2015-10-27 NOTE — Telephone Encounter (Signed)
She is going to come in today at 2pm.

## 2015-10-29 DIAGNOSIS — M4316 Spondylolisthesis, lumbar region: Secondary | ICD-10-CM | POA: Diagnosis not present

## 2015-10-29 DIAGNOSIS — I252 Old myocardial infarction: Secondary | ICD-10-CM | POA: Diagnosis not present

## 2015-10-29 DIAGNOSIS — I10 Essential (primary) hypertension: Secondary | ICD-10-CM | POA: Diagnosis not present

## 2015-10-29 DIAGNOSIS — Z4789 Encounter for other orthopedic aftercare: Secondary | ICD-10-CM | POA: Diagnosis not present

## 2015-10-29 DIAGNOSIS — M4806 Spinal stenosis, lumbar region: Secondary | ICD-10-CM | POA: Diagnosis not present

## 2015-10-29 DIAGNOSIS — I2510 Atherosclerotic heart disease of native coronary artery without angina pectoris: Secondary | ICD-10-CM | POA: Diagnosis not present

## 2015-10-29 DIAGNOSIS — E119 Type 2 diabetes mellitus without complications: Secondary | ICD-10-CM | POA: Diagnosis not present

## 2015-10-29 DIAGNOSIS — M6281 Muscle weakness (generalized): Secondary | ICD-10-CM | POA: Diagnosis not present

## 2015-10-29 DIAGNOSIS — F319 Bipolar disorder, unspecified: Secondary | ICD-10-CM | POA: Diagnosis not present

## 2015-10-29 DIAGNOSIS — R2689 Other abnormalities of gait and mobility: Secondary | ICD-10-CM | POA: Diagnosis not present

## 2015-10-29 DIAGNOSIS — M5416 Radiculopathy, lumbar region: Secondary | ICD-10-CM | POA: Diagnosis not present

## 2015-10-29 DIAGNOSIS — Z981 Arthrodesis status: Secondary | ICD-10-CM | POA: Diagnosis not present

## 2015-11-01 ENCOUNTER — Ambulatory Visit (INDEPENDENT_AMBULATORY_CARE_PROVIDER_SITE_OTHER): Payer: Commercial Managed Care - HMO | Admitting: Endocrinology

## 2015-11-01 ENCOUNTER — Encounter: Payer: Self-pay | Admitting: Endocrinology

## 2015-11-01 VITALS — BP 110/62 | HR 67 | Wt 157.0 lb

## 2015-11-01 DIAGNOSIS — I2510 Atherosclerotic heart disease of native coronary artery without angina pectoris: Secondary | ICD-10-CM | POA: Diagnosis not present

## 2015-11-01 DIAGNOSIS — E119 Type 2 diabetes mellitus without complications: Secondary | ICD-10-CM

## 2015-11-01 DIAGNOSIS — Z4789 Encounter for other orthopedic aftercare: Secondary | ICD-10-CM | POA: Diagnosis not present

## 2015-11-01 DIAGNOSIS — R2689 Other abnormalities of gait and mobility: Secondary | ICD-10-CM | POA: Diagnosis not present

## 2015-11-01 DIAGNOSIS — F319 Bipolar disorder, unspecified: Secondary | ICD-10-CM | POA: Diagnosis not present

## 2015-11-01 DIAGNOSIS — M6281 Muscle weakness (generalized): Secondary | ICD-10-CM | POA: Diagnosis not present

## 2015-11-01 DIAGNOSIS — I10 Essential (primary) hypertension: Secondary | ICD-10-CM | POA: Diagnosis not present

## 2015-11-01 DIAGNOSIS — M4316 Spondylolisthesis, lumbar region: Secondary | ICD-10-CM | POA: Diagnosis not present

## 2015-11-01 DIAGNOSIS — I252 Old myocardial infarction: Secondary | ICD-10-CM | POA: Diagnosis not present

## 2015-11-01 MED ORDER — SITAGLIPTIN PHOSPHATE 100 MG PO TABS: 100.0000 mg | ORAL_TABLET | Freq: Every day | ORAL | Status: DC

## 2015-11-01 NOTE — Patient Instructions (Addendum)
check your blood sugar once a day.  vary the time of day when you check, between before the 3 meals, and at bedtime.  also check if you have symptoms of your blood sugar being too high or too low.  please keep a record of the readings and bring it to your next appointment here (or you can bring the meter itself).  You can write it on any piece of paper.  please call us sooner if your blood sugar goes below 70, or if you have a lot of readings over 200. Our a1c goal will be 6.0%.  If it is higher today, we'll add slow-release metformin, at a low amount at first. Please come back for a follow-up appointment in 3-4 months.

## 2015-11-01 NOTE — Progress Notes (Signed)
Subjective:    Patient ID: [name], female    DOB: December 12, 1960, 55 y.o.   MRN: [id number]  HPI Pt has multinodular goiter (dx'ed 2014; it was found on [place], which had been done to investigate lymphadenopathy of the neck; the nodes appeared benign, but goiter was incidentally noted; bx was c/w benign follicular nodule; she was noted in early 2015 to have suppressed TSH, and she received RAI in April of 2015; in Oct of 2015, she was noted to have elevated TSH, and was rx'ed synthroid).  since on the increased synthroid, pt states she feels no different, and well in general.  She does not notice any nodule in the neck.   Pt returns for f/u of diabetes mellitus: DM type: 2 Dx'ed: [id number] Complications: polyneuropathy, PAD, and CAD Therapy: 2 oral meds GDM: never DKA: never Severe hypoglycemia: never Pancreatitis: never Other: She has never been on insulin. Interval history: She is recovering from back surgery. Past Medical History  Diagnosis Date  . Back pain, chronic   . Depression   . Myocardial infarction ([place]) 11/20/06  . Hypertension   . Hypercholesteremia   . Coronary artery disease   . Allergy   . Arthritis   . Neuromuscular disorder ([place])   . Bronchitis     hx-no inhalers at home  . Diabetes mellitus     diet controlled  . DDD (degenerative disc disease), lumbar   . Ejection fraction     .  Marland Kitchen Hx of CABG   . Carotid bruit   . Nodule of neck   . Multiple thyroid nodules   . Hypothyroidism   . Anxiety   . GERD (gastroesophageal reflux disease)   . Bipolar disorder ([place])   . Carpal tunnel syndrome on both sides     Past Surgical History  Procedure Laterality Date  . Knee surgery  2009    lt   . Tubal ligation    . Breast surgery      rt br cystx2  . Abdominal hysterectomy  1984  . Coronary artery bypass graft  2008  . Orif ankle fracture Right 07/12/2012    Procedure: OPEN REDUCTION INTERNAL FIXATION (ORIF) RIGHT ANKLE FRACTURE;  Surgeon: Alta Corning, MD;  Location: [facility];  Service: Orthopedics;  Laterality: Right;  . Wrist surgery Left     Social History   Social History  . Marital Status: Married    Spouse Name: N/A  . Number of Children: N/A  . Years of Education: N/A   Occupational History  . Not on file.   Social History Main Topics  . Smoking status: Current Every Day Smoker -- 0.25 packs/day for 29 years    Types: Cigarettes  . Smokeless tobacco: Never Used  . Alcohol Use: No  . Drug Use: No  . Sexual Activity: Not on file   Other Topics Concern  . Not on file   Social History Narrative    Current Outpatient Prescriptions on File Prior to Visit  Medication Sig Dispense Refill  . ACCU-CHEK AVIVA PLUS test strip Use to check blood sugar 3 times daily. 300 each 2  . aspirin EC 81 MG tablet Take 81 mg by mouth 2 (two) times daily.    Marland Kitchen buPROPion (WELLBUTRIN SR) 150 MG 12 hr tablet Take 150 mg by mouth every morning.     . carvedilol (COREG) 3.125 MG tablet TAKE 1 TABLET (3.125 MG TOTAL) BY MOUTH 2 (TWO) TIMES DAILY. [place]  tablet 2  . diazepam (VALIUM) 5 MG tablet TAKE 1 TABLET BY MOUTH EVERY TWELVE HOURS AS NEEDED FOR MUSCLE SPASM OR SEDATION 30 tablet 0  . DULoxetine (CYMBALTA) 60 MG capsule Take 60 mg by mouth 2 (two) times daily.    Marland Kitchen gabapentin (NEURONTIN) 400 MG capsule Take 400 mg by mouth 3 (three) times daily.     Marland Kitchen levothyroxine (SYNTHROID, LEVOTHROID) 75 MCG tablet TAKE 1 TABLET (75 MCG TOTAL) BY MOUTH DAILY BEFORE BREAKFAST. (Patient taking differently: Take 75 mcg by mouth daily before breakfast. ) 90 tablet 2  . metFORMIN (GLUCOPHAGE-XR) 500 MG 24 hr tablet Take 2 tablets (1,000 mg total) by mouth daily with breakfast. 180 tablet 3  . oxyCODONE-acetaminophen (PERCOCET) 10-325 MG tablet Take 1 tablet by mouth every 6 (six) hours as needed for pain.    Marland Kitchen QUEtiapine (SEROQUEL) 200 MG tablet Take 200 mg by mouth 3 (three) times daily.    . ranitidine (ZANTAC) 150 MG tablet Take 1  tablet (150 mg total) by mouth 2 (two) times daily. 60 tablet 11  . rosuvastatin (CRESTOR) 20 MG tablet Take 20 mg by mouth daily.    . traZODone (DESYREL) 50 MG tablet Take 50 mg by mouth at bedtime as needed for sleep.     . Ipratropium-Albuterol (COMBIVENT RESPIMAT) 20-100 MCG/ACT AERS respimat Inhale 1 puff into the lungs every 6 (six) hours. (Patient not taking: Reported on 11/01/2015) 4 g 1   No current facility-administered medications on file prior to visit.    Allergies  Allergen Reactions  . Ibuprofen Swelling and Rash  . Prednisone Swelling and Rash  . Coconut Flavor Swelling and Rash  . Cyclobenzaprine Other (See Comments)    Reaction unknown  . Morphine Nausea And Vomiting    Family History  Problem Relation Age of Onset  . Heart disease Mother   . Hyperlipidemia Mother   . Hypertension Mother   . Heart disease Father   . Stroke Father   . Hypertension Father   . Hyperlipidemia Father   . Hypertension Sister   . Hypertension Brother   . Cancer Daughter     unknown  . Hypertension Maternal Grandmother   . Hypertension Maternal Grandfather   . Cancer Maternal Grandfather   . Hypertension Paternal Grandmother   . Hypertension Paternal Grandfather   . Cancer Maternal Aunt     breast and ovarian  . Cancer Paternal Aunt     breast  . Colon cancer Neg Hx     BP 110/62 mmHg  Pulse 67  Wt 157 lb (71.215 kg)  SpO2 90%   Review of Systems She denies hypoglycemia.  She has lost a few lbs.     Objective:   Physical Exam VITAL SIGNS:  See vs page GENERAL: no distress Pulses: dorsalis pedis intact bilat.   MSK: no deformity of the feet CV: no leg edema Skin:  no ulcer on the feet.  normal temp on the feet.  Large port-wine area on the right leg and foot. Neuro: sensation is intact to touch on the feet, but decreased from normal.    Lab Results  Component Value Date   HGBA1C 6.9* 10/19/2015   Lab Results  Component Value Date   TSH 1.98 07/29/2015    Lab Results  Component Value Date   CREATININE 0.75 10/22/2015   BUN 5* 10/22/2015   NA 136 10/22/2015   K 4.1 10/22/2015   CL 106 10/22/2015   CO2 26 10/22/2015  Assessment & Plan:  DM: apparently well-controlled.    Patient is advised the following: Patient Instructions  check your blood sugar once a day.  vary the time of day when you check, between before the 3 meals, and at bedtime.  also check if you have symptoms of your blood sugar being too high or too low.  please keep a record of the readings and bring it to your next appointment here (or you can bring the meter itself).  You can write it on any piece of paper.  please call us sooner if your blood sugar goes below 70, or if you have a lot of readings over 200. Our a1c goal will be 6.0%.  If it is higher today, we'll add slow-release metformin, at a low amount at first. Please come back for a follow-up appointment in 3-4 months.    Renato Shin, MD

## 2015-11-03 DIAGNOSIS — Z4789 Encounter for other orthopedic aftercare: Secondary | ICD-10-CM | POA: Diagnosis not present

## 2015-11-03 DIAGNOSIS — M4316 Spondylolisthesis, lumbar region: Secondary | ICD-10-CM | POA: Diagnosis not present

## 2015-11-03 DIAGNOSIS — I2510 Atherosclerotic heart disease of native coronary artery without angina pectoris: Secondary | ICD-10-CM | POA: Diagnosis not present

## 2015-11-03 DIAGNOSIS — I10 Essential (primary) hypertension: Secondary | ICD-10-CM | POA: Diagnosis not present

## 2015-11-03 DIAGNOSIS — F319 Bipolar disorder, unspecified: Secondary | ICD-10-CM | POA: Diagnosis not present

## 2015-11-03 DIAGNOSIS — Z9889 Other specified postprocedural states: Secondary | ICD-10-CM | POA: Diagnosis not present

## 2015-11-03 DIAGNOSIS — I252 Old myocardial infarction: Secondary | ICD-10-CM | POA: Diagnosis not present

## 2015-11-03 DIAGNOSIS — E119 Type 2 diabetes mellitus without complications: Secondary | ICD-10-CM | POA: Diagnosis not present

## 2015-11-03 DIAGNOSIS — M6281 Muscle weakness (generalized): Secondary | ICD-10-CM | POA: Diagnosis not present

## 2015-11-03 DIAGNOSIS — R2689 Other abnormalities of gait and mobility: Secondary | ICD-10-CM | POA: Diagnosis not present

## 2015-11-05 DIAGNOSIS — I2510 Atherosclerotic heart disease of native coronary artery without angina pectoris: Secondary | ICD-10-CM | POA: Diagnosis not present

## 2015-11-05 DIAGNOSIS — I10 Essential (primary) hypertension: Secondary | ICD-10-CM | POA: Diagnosis not present

## 2015-11-05 DIAGNOSIS — F319 Bipolar disorder, unspecified: Secondary | ICD-10-CM | POA: Diagnosis not present

## 2015-11-05 DIAGNOSIS — M6281 Muscle weakness (generalized): Secondary | ICD-10-CM | POA: Diagnosis not present

## 2015-11-05 DIAGNOSIS — Z4789 Encounter for other orthopedic aftercare: Secondary | ICD-10-CM | POA: Diagnosis not present

## 2015-11-05 DIAGNOSIS — M4316 Spondylolisthesis, lumbar region: Secondary | ICD-10-CM | POA: Diagnosis not present

## 2015-11-05 DIAGNOSIS — E119 Type 2 diabetes mellitus without complications: Secondary | ICD-10-CM | POA: Diagnosis not present

## 2015-11-05 DIAGNOSIS — R2689 Other abnormalities of gait and mobility: Secondary | ICD-10-CM | POA: Diagnosis not present

## 2015-11-05 DIAGNOSIS — I252 Old myocardial infarction: Secondary | ICD-10-CM | POA: Diagnosis not present

## 2015-11-10 DIAGNOSIS — M6281 Muscle weakness (generalized): Secondary | ICD-10-CM | POA: Diagnosis not present

## 2015-11-10 DIAGNOSIS — I10 Essential (primary) hypertension: Secondary | ICD-10-CM | POA: Diagnosis not present

## 2015-11-10 DIAGNOSIS — E119 Type 2 diabetes mellitus without complications: Secondary | ICD-10-CM | POA: Diagnosis not present

## 2015-11-10 DIAGNOSIS — F319 Bipolar disorder, unspecified: Secondary | ICD-10-CM | POA: Diagnosis not present

## 2015-11-10 DIAGNOSIS — I2510 Atherosclerotic heart disease of native coronary artery without angina pectoris: Secondary | ICD-10-CM | POA: Diagnosis not present

## 2015-11-10 DIAGNOSIS — I252 Old myocardial infarction: Secondary | ICD-10-CM | POA: Diagnosis not present

## 2015-11-10 DIAGNOSIS — M4316 Spondylolisthesis, lumbar region: Secondary | ICD-10-CM | POA: Diagnosis not present

## 2015-11-10 DIAGNOSIS — R2689 Other abnormalities of gait and mobility: Secondary | ICD-10-CM | POA: Diagnosis not present

## 2015-11-10 DIAGNOSIS — Z4789 Encounter for other orthopedic aftercare: Secondary | ICD-10-CM | POA: Diagnosis not present

## 2015-11-11 DIAGNOSIS — Z79891 Long term (current) use of opiate analgesic: Secondary | ICD-10-CM | POA: Diagnosis not present

## 2015-11-11 DIAGNOSIS — F329 Major depressive disorder, single episode, unspecified: Secondary | ICD-10-CM | POA: Diagnosis not present

## 2015-11-11 DIAGNOSIS — M5416 Radiculopathy, lumbar region: Secondary | ICD-10-CM | POA: Diagnosis not present

## 2015-11-11 DIAGNOSIS — G8929 Other chronic pain: Secondary | ICD-10-CM | POA: Diagnosis not present

## 2015-11-11 DIAGNOSIS — M545 Low back pain: Secondary | ICD-10-CM | POA: Diagnosis not present

## 2015-11-16 DIAGNOSIS — R2689 Other abnormalities of gait and mobility: Secondary | ICD-10-CM | POA: Diagnosis not present

## 2015-11-16 DIAGNOSIS — E119 Type 2 diabetes mellitus without complications: Secondary | ICD-10-CM | POA: Diagnosis not present

## 2015-11-16 DIAGNOSIS — M6281 Muscle weakness (generalized): Secondary | ICD-10-CM | POA: Diagnosis not present

## 2015-11-16 DIAGNOSIS — Z4789 Encounter for other orthopedic aftercare: Secondary | ICD-10-CM | POA: Diagnosis not present

## 2015-11-16 DIAGNOSIS — I252 Old myocardial infarction: Secondary | ICD-10-CM | POA: Diagnosis not present

## 2015-11-16 DIAGNOSIS — I2510 Atherosclerotic heart disease of native coronary artery without angina pectoris: Secondary | ICD-10-CM | POA: Diagnosis not present

## 2015-11-16 DIAGNOSIS — M4316 Spondylolisthesis, lumbar region: Secondary | ICD-10-CM | POA: Diagnosis not present

## 2015-11-16 DIAGNOSIS — I10 Essential (primary) hypertension: Secondary | ICD-10-CM | POA: Diagnosis not present

## 2015-11-16 DIAGNOSIS — F319 Bipolar disorder, unspecified: Secondary | ICD-10-CM | POA: Diagnosis not present

## 2015-11-18 DIAGNOSIS — I10 Essential (primary) hypertension: Secondary | ICD-10-CM | POA: Diagnosis not present

## 2015-11-18 DIAGNOSIS — E119 Type 2 diabetes mellitus without complications: Secondary | ICD-10-CM | POA: Diagnosis not present

## 2015-11-18 DIAGNOSIS — M4316 Spondylolisthesis, lumbar region: Secondary | ICD-10-CM | POA: Diagnosis not present

## 2015-11-18 DIAGNOSIS — I252 Old myocardial infarction: Secondary | ICD-10-CM | POA: Diagnosis not present

## 2015-11-18 DIAGNOSIS — Z4789 Encounter for other orthopedic aftercare: Secondary | ICD-10-CM | POA: Diagnosis not present

## 2015-11-18 DIAGNOSIS — F319 Bipolar disorder, unspecified: Secondary | ICD-10-CM | POA: Diagnosis not present

## 2015-11-18 DIAGNOSIS — R2689 Other abnormalities of gait and mobility: Secondary | ICD-10-CM | POA: Diagnosis not present

## 2015-11-18 DIAGNOSIS — M6281 Muscle weakness (generalized): Secondary | ICD-10-CM | POA: Diagnosis not present

## 2015-11-18 DIAGNOSIS — I2510 Atherosclerotic heart disease of native coronary artery without angina pectoris: Secondary | ICD-10-CM | POA: Diagnosis not present

## 2015-11-23 DIAGNOSIS — R2689 Other abnormalities of gait and mobility: Secondary | ICD-10-CM | POA: Diagnosis not present

## 2015-11-23 DIAGNOSIS — I252 Old myocardial infarction: Secondary | ICD-10-CM | POA: Diagnosis not present

## 2015-11-23 DIAGNOSIS — I10 Essential (primary) hypertension: Secondary | ICD-10-CM | POA: Diagnosis not present

## 2015-11-23 DIAGNOSIS — M6281 Muscle weakness (generalized): Secondary | ICD-10-CM | POA: Diagnosis not present

## 2015-11-23 DIAGNOSIS — F319 Bipolar disorder, unspecified: Secondary | ICD-10-CM | POA: Diagnosis not present

## 2015-11-23 DIAGNOSIS — I2510 Atherosclerotic heart disease of native coronary artery without angina pectoris: Secondary | ICD-10-CM | POA: Diagnosis not present

## 2015-11-23 DIAGNOSIS — E119 Type 2 diabetes mellitus without complications: Secondary | ICD-10-CM | POA: Diagnosis not present

## 2015-11-23 DIAGNOSIS — Z4789 Encounter for other orthopedic aftercare: Secondary | ICD-10-CM | POA: Diagnosis not present

## 2015-11-23 DIAGNOSIS — M4316 Spondylolisthesis, lumbar region: Secondary | ICD-10-CM | POA: Diagnosis not present

## 2015-11-25 ENCOUNTER — Encounter: Payer: Self-pay | Admitting: Family Medicine

## 2015-11-25 ENCOUNTER — Ambulatory Visit (INDEPENDENT_AMBULATORY_CARE_PROVIDER_SITE_OTHER): Payer: Commercial Managed Care - HMO | Admitting: Family Medicine

## 2015-11-25 VITALS — BP 124/72 | HR 72 | Temp 98.2°F | Resp 18 | Ht 62.0 in | Wt 157.0 lb

## 2015-11-25 DIAGNOSIS — E1149 Type 2 diabetes mellitus with other diabetic neurological complication: Secondary | ICD-10-CM | POA: Diagnosis not present

## 2015-11-25 NOTE — Progress Notes (Signed)
Subjective:    Patient ID: [name], female    DOB: November 25, 1960, 55 y.o.   MRN: [id number] Chief Complaint  Patient presents with  . Medication Refill    all medications     HPI   Was having some low sugars and saw Dr. Loanne Drilling last month and he switched ero metformin ER and [place] which she is doing better on. Has apt with Dr. Lily Kocher  Pre-dop cardoilogy was "perfect" 10mg  of oxycodone 4 times a day - takes the edge of for 2 hrs.  Has been taking ibuprofen 400mg  in between several times a day in between.  Is supposed to be uusing tylenol Sleeping alreight occ gerd out of hand  Dr. Earley Abide gave her 30 of the 5 and so she is seeing pain management in [place] now. Is supposed to sleep flat on her back but she is waking up with pain She was told that recovery would take 15 mos.  She has a $3000 brace that is supposed to cut her bone growing time in half. Still taking some msucle spasm down her buttocks for wqhich she takes prn valium which works - or for [employer] is doing well.  Has cut down to 3-4 cig/d.   Moved to [place].  Past Medical History:  Diagnosis Date  . Allergy   . Anxiety   . Arthritis   . Back pain, chronic   . Bipolar disorder ([place])   . Bronchitis    hx-no inhalers at home  . Carotid bruit   . Carpal tunnel syndrome on both sides   . Coronary artery disease   . DDD (degenerative disc disease), lumbar   . Depression   . Diabetes mellitus    diet controlled  . Ejection fraction    .  Marland Kitchen GERD (gastroesophageal reflux disease)   . Hx of CABG   . Hypercholesteremia   . Hypertension   . Hypothyroidism   . Multiple thyroid nodules   . Myocardial infarction ([place]) 11/20/06  . Neuromuscular disorder ([place])   . Nodule of neck    Past Surgical History:  Procedure Laterality Date  . ABDOMINAL HYSTERECTOMY  1984  . BREAST SURGERY     rt br cystx2  . CORONARY ARTERY BYPASS GRAFT  2008  . KNEE SURGERY  2009   lt   . ORIF ANKLE  FRACTURE Right 07/12/2012   Procedure: OPEN REDUCTION INTERNAL FIXATION (ORIF) RIGHT ANKLE FRACTURE;  Surgeon: Alta Corning, MD;  Location: [facility];  Service: Orthopedics;  Laterality: Right;  . TUBAL LIGATION    . WRIST SURGERY Left    Current Outpatient Prescriptions on File Prior to Visit  Medication Sig Dispense Refill  . ACCU-CHEK AVIVA PLUS test strip Use to check blood sugar 3 times daily. 300 each 2  . aspirin EC 81 MG tablet Take 81 mg by mouth 2 (two) times daily.    Marland Kitchen buPROPion (WELLBUTRIN SR) 150 MG 12 hr tablet Take 150 mg by mouth every morning.     . carvedilol (COREG) 3.125 MG tablet TAKE 1 TABLET (3.125 MG TOTAL) BY MOUTH 2 (TWO) TIMES DAILY. 60 tablet 2  . diazepam (VALIUM) 5 MG tablet TAKE 1 TABLET BY MOUTH EVERY TWELVE HOURS AS NEEDED FOR MUSCLE SPASM OR SEDATION 30 tablet 0  . DULoxetine (CYMBALTA) 60 MG capsule Take 60 mg by mouth 2 (two) times daily.    Marland Kitchen gabapentin (NEURONTIN) 400 MG capsule Take 400 mg by mouth 3 (  three) times daily.     . Ipratropium-Albuterol (COMBIVENT RESPIMAT) 20-100 MCG/ACT AERS respimat Inhale 1 puff into the lungs every 6 (six) hours. 4 g 1  . levothyroxine (SYNTHROID, LEVOTHROID) 75 MCG tablet TAKE 1 TABLET (75 MCG TOTAL) BY MOUTH DAILY BEFORE BREAKFAST. (Patient taking differently: Take 75 mcg by mouth daily before breakfast. ) 90 tablet 2  . metFORMIN (GLUCOPHAGE-XR) 500 MG 24 hr tablet Take 2 tablets (1,000 mg total) by mouth daily with breakfast. 180 tablet 3  . oxyCODONE-acetaminophen (PERCOCET) 10-325 MG tablet Take 1 tablet by mouth every 6 (six) hours as needed for pain.    Marland Kitchen QUEtiapine (SEROQUEL) 200 MG tablet Take 200 mg by mouth 3 (three) times daily.    . ranitidine (ZANTAC) 150 MG tablet Take 1 tablet (150 mg total) by mouth 2 (two) times daily. 60 tablet 11  . rosuvastatin (CRESTOR) 20 MG tablet Take 20 mg by mouth daily.    . sitaGLIPtin (JANUVIA) 100 MG tablet Take 1 tablet (100 mg total) by mouth daily. 90  tablet 3  . traZODone (DESYREL) 50 MG tablet Take 50 mg by mouth at bedtime as needed for sleep.      No current facility-administered medications on file prior to visit.    Allergies  Allergen Reactions  . Ibuprofen Swelling and Rash  . Prednisone Swelling and Rash  . Coconut Flavor Swelling and Rash  . Cyclobenzaprine Other (See Comments)    Reaction unknown  . Morphine Nausea And Vomiting   Family History  Problem Relation Age of Onset  . Heart disease Mother   . Hyperlipidemia Mother   . Hypertension Mother   . Heart disease Father   . Stroke Father   . Hypertension Father   . Hyperlipidemia Father   . Hypertension Sister   . Hypertension Brother   . Cancer Daughter     unknown  . Hypertension Maternal Grandmother   . Hypertension Maternal Grandfather   . Cancer Maternal Grandfather   . Hypertension Paternal Grandmother   . Hypertension Paternal Grandfather   . Cancer Maternal Aunt     breast and ovarian  . Cancer Paternal Aunt     breast  . Colon cancer Neg Hx    Social History   Social History  . Marital status: Married    Spouse name: N/A  . Number of children: N/A  . Years of education: N/A   Social History Main Topics  . Smoking status: Current Every Day Smoker    Packs/day: 0.25    Years: 29.00    Types: Cigarettes  . Smokeless tobacco: Never Used  . Alcohol use No  . Drug use: No  . Sexual activity: Not Asked   Other Topics Concern  . None   Social History Narrative  . None    Review of Systems  Constitutional: Negative for chills and fever.  Respiratory: Negative for chest tightness and shortness of breath.   Gastrointestinal: Negative for abdominal pain, constipation, diarrhea, nausea and vomiting.  Genitourinary: Negative for decreased urine volume, difficulty urinating, frequency and urgency.  Musculoskeletal: Positive for arthralgias, back pain, gait problem, joint swelling and myalgias.  Skin: Positive for rash. Negative for color  change and wound.  Neurological: Negative for weakness and numbness.  Hematological: Negative for adenopathy. Does not bruise/bleed easily.  Psychiatric/Behavioral: Negative for sleep disturbance. The patient is nervous/anxious.        Objective:   Physical Exam  Constitutional: She is oriented to person, place, and time.  She appears well-developed and well-nourished. No distress.  HENT:  Head: Normocephalic and atraumatic.  Right Ear: External ear normal.  Left Ear: External ear normal.  Eyes: Conjunctivae are normal. No scleral icterus.  Neck: Normal range of motion. Neck supple. No thyromegaly present.  Cardiovascular: Normal rate, regular rhythm, normal heart sounds and intact distal pulses.   Pulmonary/Chest: Effort normal and breath sounds normal. No respiratory distress.  Musculoskeletal: She exhibits no edema.  Lymphadenopathy:    She has no cervical adenopathy.  Neurological: She is alert and oriented to person, place, and time.  Skin: Skin is warm and dry. She is not diaphoretic. No erythema.  Port-wine mottling on legs  Psychiatric: She has a normal mood and affect. Her behavior is normal.      BP 124/72   Pulse 72   Temp 98.2 F (36.8 C) (Oral)   Resp 18   Ht 5\' 2"  (1.575 m)   Wt 157 lb (71.2 kg)   SpO2 95%   BMI 28.72 kg/m      Assessment & Plan:  Fine to refill all meds x 6 mos whenever requested by [facility]  Next time pt has labs drawn - recheck bmp for calcium  1. 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 Loanne Drilling for mngmnt of DM and hypothyroid  Orders Placed This Encounter  Procedures  . Microalbumin/Creatinine Ratio, Urine     [name], M.D.  [facility] [address] [place], [place] [address] [phone] phone [phone] fax  11/28/15 5:32 PM

## 2015-11-25 NOTE — Patient Instructions (Addendum)
IF you received an x-ray today, you will receive an invoice from [facility]. Please contact [facility] at [phone] with questions or concerns regarding your invoice.   IF you received labwork today, you will receive an invoice from [employer]. Please contact [facility] at [phone] with questions or concerns regarding your invoice.   Our billing staff will not be able to assist you with questions regarding bills from these companies.  You will be contacted with the lab results as soon as they are available. The fastest way to get your results is to activate your My Chart account. Instructions are located on the last page of this paperwork. If you have not heard from [place] regarding the results in 2 weeks, please contact this office.    Bone Health Bones protect organs, store calcium, and anchor muscles. Good health habits, such as eating nutritious foods and exercising regularly, are important for maintaining healthy bones. They can also help to prevent a condition that causes bones to lose density and become weak and brittle (osteoporosis). WHY IS BONE MASS IMPORTANT? Bone mass refers to the amount of bone tissue that you have. The higher your bone mass, the stronger your bones. An important step toward having healthy bones throughout life is to have strong and dense bones during childhood. A young adult who has a high bone mass is more likely to have a high bone mass later in life. Bone mass at its greatest it is called peak bone mass. A large decline in bone mass occurs in older adults. In women, it occurs about the time of menopause. During this time, it is important to practice good health habits, because if more bone is lost than what is replaced, the bones will become less healthy and more likely to break (fracture). If you find that you have a low bone mass, you may be able to prevent osteoporosis or further bone loss by changing your diet  and lifestyle. HOW CAN I FIND OUT IF MY BONE MASS IS LOW? Bone mass can be measured with an X-ray test that is called a bone mineral density (BMD) test. This test is recommended for all women who are age 25 or older. It may also be recommended for men who are age 95 or older, or for people who are more likely to develop osteoporosis due to:  Having bones that break easily.  Having a long-term disease that weakens bones, such as kidney disease or rheumatoid arthritis.  Having menopause earlier than normal.  Taking medicine that weakens bones, such as steroids, thyroid hormones, or hormone treatment for breast cancer or prostate cancer.  Smoking.  Drinking three or more alcoholic drinks each day. WHAT ARE THE NUTRITIONAL RECOMMENDATIONS FOR HEALTHY BONES? To have healthy bones, you need to get enough of the right minerals and vitamins. Most nutrition experts recommend getting these nutrients from the foods that you eat. Nutritional recommendations vary from person to person. Ask your health care provider what is healthy for you. Here are some general guidelines. Calcium Recommendations Calcium is the most important (essential) mineral for bone health. Most people can get enough calcium from their diet, but supplements may be recommended for people who are at risk for osteoporosis. Good sources of calcium include:  Dairy products, such as low-fat or nonfat milk, cheese, and yogurt.  Dark green leafy vegetables, such as bok choy and broccoli.  Calcium-fortified foods, such as orange juice, cereal, bread, soy beverages, and tofu products.  Nuts, such as almonds. Follow these recommended amounts for daily calcium intake:  Children, age 52-3: 700 mg.  Children, age 66-8: 1,000 mg.  Children, age 63-13: 1,300 mg.  Teens, age 669-18: 1,300 mg.  Adults, age 28-50: 1,000 mg.  Adults, age 57-70:  Men: 1,000 mg.  Women: 1,200 mg.  Adults, age 28 or older: 1,200 mg.  Pregnant and  breastfeeding females:  Teens: 1,300 mg.  Adults: 1,000 mg. Vitamin D Recommendations Vitamin D is the most essential vitamin for bone health. It helps the body to absorb calcium. Sunlight stimulates the skin to make vitamin D, so be sure to get enough sunlight. If you live in a cold climate or you do not get outside often, your health care provider may recommend that you take vitamin D supplements. Good sources of vitamin D in your diet include:  Egg yolks.  Saltwater fish.  Milk and cereal fortified with vitamin D. Follow these recommended amounts for daily vitamin D intake:  Children and teens, age 52-18: 600 international units.  Adults, age 87 or younger: 400-800 international units.  Adults, age 56 or older: 800-1,000 international units. Other Nutrients Other nutrients for bone health include:  Phosphorus. This mineral is found in meat, poultry, dairy foods, nuts, and legumes. The recommended daily intake for adult men and adult women is 700 mg.  Magnesium. This mineral is found in seeds, nuts, dark green vegetables, and legumes. The recommended daily intake for adult men is 400-420 mg. For adult women, it is 310-320 mg.  Vitamin K. This vitamin is found in green leafy vegetables. The recommended daily intake is 120 mg for adult men and 90 mg for adult women. WHAT TYPE OF PHYSICAL ACTIVITY IS BEST FOR BUILDING AND MAINTAINING HEALTHY BONES? Weight-bearing and strength-building activities are important for building and maintaining peak bone mass. Weight-bearing activities cause muscles and bones to work against gravity. Strength-building activities increases muscle strength that supports bones. Weight-bearing and muscle-building activities include:  Walking and hiking.  Jogging and running.  Dancing.  Gym exercises.  Lifting weights.  Tennis and racquetball.  Climbing stairs.  Aerobics. Adults should get at least 30 minutes of moderate physical activity on most  days. Children should get at least 60 minutes of moderate physical activity on most days. Ask your health care provide what type of exercise is best for you. WHERE CAN I FIND MORE INFORMATION? For more information, check out the following websites:  [place]: YardHomes.se  [employer] of Health: http://www.niams.AnonymousEar.fr.asp   This information is not intended to replace advice given to you by your health care provider. Make sure you discuss any questions you have with your health care provider.   Document Released: 07/08/2003 Document Revised: 09/01/2014 Document Reviewed: 04/22/2014 Elsevier Interactive Patient Education [employer].

## 2015-11-26 LAB — MICROALBUMIN / CREATININE URINE RATIO
Creatinine, Urine: 105 mg/dL (ref 20–320)
MICROALB/CREAT RATIO: 4 ug/mg{creat} (ref <30)
Microalb, Ur: 0.4 mg/dL

## 2015-11-27 ENCOUNTER — Encounter: Payer: Self-pay | Admitting: Endocrinology

## 2015-11-27 DIAGNOSIS — Z0100 Encounter for examination of eyes and vision without abnormal findings: Secondary | ICD-10-CM | POA: Diagnosis not present

## 2015-11-27 DIAGNOSIS — E119 Type 2 diabetes mellitus without complications: Secondary | ICD-10-CM | POA: Diagnosis not present

## 2015-11-27 LAB — HM DIABETES EYE EXAM

## 2015-11-30 ENCOUNTER — Encounter: Payer: Self-pay | Admitting: Family Medicine

## 2015-11-30 DIAGNOSIS — M4316 Spondylolisthesis, lumbar region: Secondary | ICD-10-CM | POA: Diagnosis not present

## 2015-12-14 ENCOUNTER — Encounter: Payer: Self-pay | Admitting: Family Medicine

## 2015-12-22 DIAGNOSIS — F319 Bipolar disorder, unspecified: Secondary | ICD-10-CM | POA: Diagnosis not present

## 2015-12-31 DIAGNOSIS — M4316 Spondylolisthesis, lumbar region: Secondary | ICD-10-CM | POA: Diagnosis not present

## 2016-01-07 NOTE — Telephone Encounter (Signed)
This was routed to me with no message/info attached.  Routing back to sender for clarification.

## 2016-01-07 NOTE — Telephone Encounter (Signed)
error 

## 2016-01-10 DIAGNOSIS — Z79891 Long term (current) use of opiate analgesic: Secondary | ICD-10-CM | POA: Diagnosis not present

## 2016-01-10 DIAGNOSIS — M5416 Radiculopathy, lumbar region: Secondary | ICD-10-CM | POA: Diagnosis not present

## 2016-01-10 DIAGNOSIS — M47816 Spondylosis without myelopathy or radiculopathy, lumbar region: Secondary | ICD-10-CM | POA: Diagnosis not present

## 2016-01-10 DIAGNOSIS — G8929 Other chronic pain: Secondary | ICD-10-CM | POA: Diagnosis not present

## 2016-01-20 ENCOUNTER — Telehealth: Payer: Self-pay

## 2016-01-20 NOTE — Telephone Encounter (Signed)
Pharmacy requesting refill on Valium 5 mg please advise

## 2016-02-01 ENCOUNTER — Encounter: Payer: Self-pay | Admitting: Endocrinology

## 2016-02-01 ENCOUNTER — Ambulatory Visit (INDEPENDENT_AMBULATORY_CARE_PROVIDER_SITE_OTHER): Payer: Medicare HMO | Admitting: Endocrinology

## 2016-02-01 VITALS — BP 132/70 | HR 70 | Ht 62.0 in | Wt 160.0 lb

## 2016-02-01 DIAGNOSIS — Z23 Encounter for immunization: Secondary | ICD-10-CM

## 2016-02-01 DIAGNOSIS — E119 Type 2 diabetes mellitus without complications: Secondary | ICD-10-CM | POA: Diagnosis not present

## 2016-02-01 DIAGNOSIS — E890 Postprocedural hypothyroidism: Secondary | ICD-10-CM

## 2016-02-01 LAB — POCT GLYCOSYLATED HEMOGLOBIN (HGB A1C): HEMOGLOBIN A1C: 6.4

## 2016-02-01 LAB — TSH: TSH: 2.02 u[IU]/mL (ref 0.35–4.50)

## 2016-02-01 MED ORDER — BROMOCRIPTINE MESYLATE 2.5 MG PO TABS: ORAL_TABLET | ORAL | 3 refills | Status: DC

## 2016-02-01 NOTE — Patient Instructions (Addendum)
check your blood sugar once a day.  vary the time of day when you check, between before the 3 meals, and at bedtime.  also check if you have symptoms of your blood sugar being too high or too low.  please keep a record of the readings and bring it to your next appointment here (or you can bring the meter itself).  You can write it on any piece of paper.  please call us sooner if your blood sugar goes below 70, or if you have a lot of readings over 200. I have sent a prescription to your pharmacy, to add "bromocriptine."  Our a1c goal will be 6.0%.   A thyroid blood test is requested for you today.  We'll let you know about the results.   You don't need the thyroid ultrasound now.  Please come back for a follow-up appointment in 4 months.

## 2016-02-01 NOTE — Progress Notes (Signed)
Subjective:    Patient ID: [name], female    DOB: [birth year], 55 y.o.   MRN: [id number]  HPI Pt has multinodular goiter (dx'ed 2014; it was found on [place], which had been done to investigate lymphadenopathy of the neck; the nodes appeared benign, but goiter was incidentally noted; bx was c/w benign follicular nodule; she was noted in early 2015 to have suppressed TSH, and she received RAI in April of 2015; in Oct of 2015, she was noted to have elevated TSH, and was rx'ed synthroid).   She does not notice any nodule in the neck.   Pt returns for f/u of diabetes mellitus: DM type: 2 Dx'ed: [id number] Complications: polyneuropathy, PAD, and CAD Therapy: 2 oral meds GDM: never DKA: never Severe hypoglycemia: never Pancreatitis: never Other: She has never been on insulin; she did not tolerate invokana (nausea) Interval history: pt states she feels well in general. Past Medical History:  Diagnosis Date  . Allergy   . Anxiety   . Arthritis   . Back pain, chronic   . Bipolar disorder ([place])   . Bronchitis    hx-no inhalers at home  . Carotid bruit   . Carpal tunnel syndrome on both sides   . Coronary artery disease   . DDD (degenerative disc disease), lumbar   . Depression   . Diabetes mellitus    diet controlled  . Ejection fraction    .  Marland Kitchen GERD (gastroesophageal reflux disease)   . Hx of CABG   . Hypercholesteremia   . Hypertension   . Hypothyroidism   . Multiple thyroid nodules   . Myocardial infarction 11/20/06  . Neuromuscular disorder ([place])   . Nodule of neck     Past Surgical History:  Procedure Laterality Date  . ABDOMINAL HYSTERECTOMY  1984  . BREAST SURGERY     rt br cystx2  . CORONARY ARTERY BYPASS GRAFT  2008  . KNEE SURGERY  2009   lt   . ORIF ANKLE FRACTURE Right 07/12/2012   Procedure: OPEN REDUCTION INTERNAL FIXATION (ORIF) RIGHT ANKLE FRACTURE;  Surgeon: Alta Corning, MD;  Location: [facility];  Service: Orthopedics;  Laterality:  Right;  . TUBAL LIGATION    . WRIST SURGERY Left     Social History   Social History  . Marital status: Married    Spouse name: N/A  . Number of children: N/A  . Years of education: N/A   Occupational History  . Not on file.   Social History Main Topics  . Smoking status: Current Every Day Smoker    Packs/day: 0.25    Years: 29.00    Types: Cigarettes  . Smokeless tobacco: Never Used  . Alcohol use No  . Drug use: No  . Sexual activity: Not on file   Other Topics Concern  . Not on file   Social History Narrative  . No narrative on file    Current Outpatient Prescriptions on File Prior to Visit  Medication Sig Dispense Refill  . ACCU-CHEK AVIVA PLUS test strip Use to check blood sugar 3 times daily. 300 each 2  . aspirin EC 81 MG tablet Take 81 mg by mouth 2 (two) times daily.    Marland Kitchen buPROPion (WELLBUTRIN SR) 150 MG 12 hr tablet Take 150 mg by mouth every morning.     . carvedilol (COREG) 3.125 MG tablet TAKE 1 TABLET (3.125 MG TOTAL) BY MOUTH 2 (TWO) TIMES DAILY. 60 tablet 2  . diazepam (  VALIUM) 5 MG tablet TAKE 1 TABLET BY MOUTH EVERY TWELVE HOURS AS NEEDED FOR MUSCLE SPASM OR SEDATION 30 tablet 0  . DULoxetine (CYMBALTA) 60 MG capsule Take 60 mg by mouth 2 (two) times daily.    Marland Kitchen gabapentin (NEURONTIN) 400 MG capsule Take 800 mg by mouth 3 (three) times daily.     . Ipratropium-Albuterol (COMBIVENT RESPIMAT) 20-100 MCG/ACT AERS respimat Inhale 1 puff into the lungs every 6 (six) hours. (Patient taking differently: Inhale 1 puff into the lungs as needed. ) 4 g 1  . levothyroxine (SYNTHROID, LEVOTHROID) 75 MCG tablet TAKE 1 TABLET (75 MCG TOTAL) BY MOUTH DAILY BEFORE BREAKFAST. (Patient taking differently: Take 75 mcg by mouth daily before breakfast. ) 90 tablet 2  . metFORMIN (GLUCOPHAGE-XR) 500 MG 24 hr tablet Take 2 tablets (1,000 mg total) by mouth daily with breakfast. 180 tablet 3  . oxyCODONE-acetaminophen (PERCOCET) 10-325 MG tablet Take 1 tablet by mouth every 6  (six) hours as needed for pain.    Marland Kitchen QUEtiapine (SEROQUEL) 200 MG tablet Take 200 mg by mouth 3 (three) times daily.    . ranitidine (ZANTAC) 150 MG tablet Take 1 tablet (150 mg total) by mouth 2 (two) times daily. 60 tablet 11  . rosuvastatin (CRESTOR) 20 MG tablet Take 20 mg by mouth daily.    . sitaGLIPtin (JANUVIA) 100 MG tablet Take 1 tablet (100 mg total) by mouth daily. 90 tablet 3  . traZODone (DESYREL) 50 MG tablet Take 50 mg by mouth at bedtime as needed for sleep.      No current facility-administered medications on file prior to visit.     Allergies  Allergen Reactions  . Ibuprofen Swelling and Rash  . Prednisone Swelling and Rash  . Coconut Flavor Swelling and Rash  . Cyclobenzaprine Other (See Comments)    Reaction unknown  . Morphine Nausea And Vomiting    Family History  Problem Relation Age of Onset  . Heart disease Mother   . Hyperlipidemia Mother   . Hypertension Mother   . Heart disease Father   . Stroke Father   . Hypertension Father   . Hyperlipidemia Father   . Hypertension Sister   . Hypertension Brother   . Cancer Daughter     unknown  . Hypertension Maternal Grandmother   . Hypertension Maternal Grandfather   . Cancer Maternal Grandfather   . Hypertension Paternal Grandmother   . Hypertension Paternal Grandfather   . Cancer Maternal Aunt     breast and ovarian  . Cancer Paternal Aunt     breast  . Colon cancer Neg Hx     BP 132/70   Pulse 70   Ht 5\' 2"  (1.575 m)   Wt 160 lb (72.6 kg)   SpO2 96%   BMI 29.26 kg/m    Review of Systems No weight change.     Objective:   Physical Exam VITAL SIGNS:  See vs page GENERAL: no distress NECK: thyroid is slightly enlarged, with multinodular surface.  Pulses: dorsalis pedis intact bilat.   MSK: no deformity of the feet CV: no leg edema Skin:  no ulcer on the feet.  normal temp on the feet.  Large port-wine area on the right leg and foot. Neuro: sensation is intact to touch on the feet,  but decreased from normal.   A1c=6.4%  Lab Results  Component Value Date   CREATININE 0.75 10/22/2015   BUN 5 (L) 10/22/2015   NA 136 10/22/2015   K  4.1 10/22/2015   CL 106 10/22/2015   CO2 26 10/22/2015   Lab Results  Component Value Date   TSH 2.02 02/01/2016      Assessment & Plan:  Type 2 DM: she needs increased rx, if it can be done with a regimen that avoids or minimizes hypoglycemia. Hypothyroidism: well-replaced Goiter: clinically stable. Patient is advised the following: Patient Instructions  check your blood sugar once a day.  vary the time of day when you check, between before the 3 meals, and at bedtime.  also check if you have symptoms of your blood sugar being too high or too low.  please keep a record of the readings and bring it to your next appointment here (or you can bring the meter itself).  You can write it on any piece of paper.  please call us sooner if your blood sugar goes below 70, or if you have a lot of readings over 200. I have sent a prescription to your pharmacy, to add "bromocriptine."  Our a1c goal will be 6.0%.   A thyroid blood test is requested for you today.  We'll let you know about the results.   You don't need the thyroid ultrasound now.  Please come back for a follow-up appointment in 4 months.

## 2016-02-02 ENCOUNTER — Ambulatory Visit (INDEPENDENT_AMBULATORY_CARE_PROVIDER_SITE_OTHER): Payer: Commercial Managed Care - HMO | Admitting: Family Medicine

## 2016-02-02 VITALS — BP 108/54 | HR 75 | Temp 98.3°F | Resp 16 | Ht 63.0 in | Wt 161.2 lb

## 2016-02-02 DIAGNOSIS — K219 Gastro-esophageal reflux disease without esophagitis: Secondary | ICD-10-CM | POA: Diagnosis not present

## 2016-02-02 DIAGNOSIS — Z23 Encounter for immunization: Secondary | ICD-10-CM

## 2016-02-02 MED ORDER — OMEPRAZOLE 40 MG PO CPDR: 40.0000 mg | DELAYED_RELEASE_CAPSULE | Freq: Every day | ORAL | 5 refills | Status: DC

## 2016-02-02 NOTE — Progress Notes (Signed)
Subjective:  This chart was scribed for [name] MD, by Tamsen Roers, at [facility].  This patient was seen in room 10  and the patient's care was started at 9:57 AM.   Chief Complaint  Patient presents with  . Medication Problem    Ranitidine, not working per pt     Patient ID: [name], female    DOB: Nov 25, 1960, 55 y.o.   MRN: [id number]  HPI  HPI Comments: [name] is a 55 y.o. female who presents to the [facility] complaining of her medication (Ranitidine) which she feels is "not working" and would like to try something else. Patient states that eating anything with dairy causes her to cough all night and have a "fire" sensation in her throat.  She has stopped eating after 6 pm.  Patients pain clinic physician increased her dose of Gabapentin.  She denies any change in her sleep patterns. Her A1C (checked yesterday) was 6.4. She was put on a new medication due to this. Her thyroid tests did not show any concerns. Patient is down to 4 cigarettes per day and will start using a vape.  She denies any issues with her oxygen levels or any difficulty breathing.   Patient received her flu shot yesterday. Patient would like a pneumonia vaccination today.    Past Medical History:  Diagnosis Date  . Allergy   . Anxiety   . Arthritis   . Back pain, chronic   . Bipolar disorder ([place])   . Bronchitis    hx-no inhalers at home  . Carotid bruit   . Carpal tunnel syndrome on both sides   . Coronary artery disease   . DDD (degenerative disc disease), lumbar   . Depression   . Diabetes mellitus    diet controlled  . Ejection fraction    .  Marland Kitchen GERD (gastroesophageal reflux disease)   . Hx of CABG   . Hypercholesteremia   . Hypertension   . Hypothyroidism   . Multiple thyroid nodules   . Myocardial infarction 11/20/06  . Neuromuscular disorder ([place])   . Nodule of neck     Current Outpatient Prescriptions on  File Prior to Visit  Medication Sig Dispense Refill  . ACCU-CHEK AVIVA PLUS test strip Use to check blood sugar 3 times daily. 300 each 2  . aspirin EC 81 MG tablet Take 81 mg by mouth 2 (two) times daily.    . bromocriptine (PARLODEL) 2.5 MG tablet 1/4 tab daily 25 tablet 3  . buPROPion (WELLBUTRIN SR) 150 MG 12 hr tablet Take 150 mg by mouth every morning.     . carvedilol (COREG) 3.125 MG tablet TAKE 1 TABLET (3.125 MG TOTAL) BY MOUTH 2 (TWO) TIMES DAILY. 60 tablet 2  . diazepam (VALIUM) 5 MG tablet TAKE 1 TABLET BY MOUTH EVERY TWELVE HOURS AS NEEDED FOR MUSCLE SPASM OR SEDATION 30 tablet 0  . DULoxetine (CYMBALTA) 60 MG capsule Take 60 mg by mouth 2 (two) times daily.    Marland Kitchen gabapentin (NEURONTIN) 400 MG capsule Take 800 mg by mouth 3 (three) times daily.     . Ipratropium-Albuterol (COMBIVENT RESPIMAT) 20-100 MCG/ACT AERS respimat Inhale 1 puff into the lungs every 6 (six) hours. (Patient taking differently: Inhale 1 puff into the lungs as needed. ) 4 g 1  . levothyroxine (SYNTHROID, LEVOTHROID) 75 MCG tablet TAKE 1 TABLET (75 MCG TOTAL) BY MOUTH DAILY BEFORE BREAKFAST. (Patient taking differently: Take  75 mcg by mouth daily before breakfast. ) 90 tablet 2  . metFORMIN (GLUCOPHAGE-XR) 500 MG 24 hr tablet Take 2 tablets (1,000 mg total) by mouth daily with breakfast. 180 tablet 3  . oxyCODONE-acetaminophen (PERCOCET) 10-325 MG tablet Take 1 tablet by mouth every 6 (six) hours as needed for pain.    Marland Kitchen QUEtiapine (SEROQUEL) 200 MG tablet Take 200 mg by mouth 3 (three) times daily.    . ranitidine (ZANTAC) 150 MG tablet Take 1 tablet (150 mg total) by mouth 2 (two) times daily. 60 tablet 11  . rosuvastatin (CRESTOR) 20 MG tablet Take 20 mg by mouth daily.    . sitaGLIPtin (JANUVIA) 100 MG tablet Take 1 tablet (100 mg total) by mouth daily. 90 tablet 3  . traZODone (DESYREL) 50 MG tablet Take 50 mg by mouth at bedtime as needed for sleep.      No current facility-administered medications on file  prior to visit.     Allergies  Allergen Reactions  . Ibuprofen Swelling and Rash  . Prednisone Swelling and Rash  . Coconut Flavor Swelling and Rash  . Cyclobenzaprine Other (See Comments)    Reaction unknown  . Morphine Nausea And Vomiting       Review of Systems  Constitutional: Negative for chills and fever.  Eyes: Negative for pain and redness.  Respiratory: Negative for cough and shortness of breath.   Gastrointestinal: Negative for nausea and vomiting.  Musculoskeletal: Negative for neck pain and neck stiffness.  Neurological: Negative for seizures and speech difficulty.       Objective:   Physical Exam  Constitutional: She is oriented to person, place, and time. She appears well-developed and well-nourished. No distress.  HENT:  Head: Normocephalic and atraumatic.  Eyes: Pupils are equal, round, and reactive to light.  Neck: Normal range of motion.  Cardiovascular: Normal rate, regular rhythm and normal heart sounds.  Exam reveals no gallop and no friction rub.   No murmur heard. Pulmonary/Chest: Effort normal and breath sounds normal. No respiratory distress.  Musculoskeletal: Normal range of motion.  Neurological: She is alert and oriented to person, place, and time.  Skin: Skin is warm and dry.  Psychiatric: She has a normal mood and affect.   Vitals:   02/02/16 0928  BP: (!) 108/54  Pulse: 75  Resp: 16  Temp: 98.3 F (36.8 C)  TempSrc: Oral  SpO2: 93%  Weight: 161 lb 3.2 oz (73.1 kg)  Height: 5\' 3"  (1.6 m)          Assessment & Plan:   1. Gastroesophageal reflux disease, esophagitis presence not specified     Orders Placed This Encounter  Procedures  . Pneumococcal conjugate vaccine 13-valent IM  . H. pylori breath test  . Care order/instruction:    AVS and GO with a wk worth of nexium samples - take 1 twice a day 30 min before breakfast and dinner x 1 wk    Scheduling Instructions:     AVS and GO    Meds ordered this encounter    Medications  . omeprazole (PRILOSEC) 40 MG capsule    Sig: Take 1 capsule (40 mg total) by mouth daily. 30 minutes before a meal    Dispense:  30 capsule    Refill:  5    I personally performed the services described in this documentation, which was scribed in my presence. The recorded information has been reviewed and considered, and addended by me as needed.   [name],  M.D.  [facility] [address] [place], [place] [address] [phone] phone [phone] fax  02/04/16 9:44 PM

## 2016-02-02 NOTE — Patient Instructions (Addendum)
IF you received an x-ray today, you will receive an invoice from [facility]. Please contact [facility] at [phone] with questions or concerns regarding your invoice.   IF you received labwork today, you will receive an invoice from [employer]. Please contact [facility] at [phone] with questions or concerns regarding your invoice.   Our billing staff will not be able to assist you with questions regarding bills from these companies.  You will be contacted with the lab results as soon as they are available. The fastest way to get your results is to activate your My Chart account. Instructions are located on the last page of this paperwork. If you have not heard from [place] regarding the results in 2 weeks, please contact this office.    Food Choices for Gastroesophageal Reflux Disease, Adult When you have gastroesophageal reflux disease (GERD), the foods you eat and your eating habits are very important. Choosing the right foods can help ease the discomfort of GERD. WHAT GENERAL GUIDELINES DO I NEED TO FOLLOW?  Choose fruits, vegetables, whole grains, low-fat dairy products, and low-fat meat, fish, and poultry.  Limit fats such as oils, salad dressings, butter, nuts, and avocado.  Keep a food diary to identify foods that cause symptoms.  Avoid foods that cause reflux. These may be different for different people.  Eat frequent small meals instead of three large meals each day.  Eat your meals slowly, in a relaxed setting.  Limit fried foods.  Cook foods using methods other than frying.  Avoid drinking alcohol.  Avoid drinking large amounts of liquids with your meals.  Avoid bending over or lying down until 2-3 hours after eating. WHAT FOODS ARE NOT RECOMMENDED? The following are some foods and drinks that may worsen your symptoms: Vegetables Tomatoes. Tomato juice. Tomato and spaghetti sauce. Chili peppers. Onion and garlic.  Horseradish. Fruits Oranges, grapefruit, and lemon (fruit and juice). Meats High-fat meats, fish, and poultry. This includes hot dogs, ribs, ham, sausage, salami, and bacon. Dairy Whole milk and chocolate milk. Sour cream. Cream. Butter. Ice cream. Cream cheese.  Beverages Coffee and tea, with or without caffeine. Carbonated beverages or energy drinks. Condiments Hot sauce. Barbecue sauce.  Sweets/Desserts Chocolate and cocoa. Donuts. Peppermint and spearmint. Fats and Oils High-fat foods, including [place] fries and potato chips. Other Vinegar. Strong spices, such as black pepper, white pepper, red pepper, cayenne, curry powder, cloves, ginger, and chili powder. The items listed above may not be a complete list of foods and beverages to avoid. Contact your dietitian for more information.   This information is not intended to replace advice given to you by your health care provider. Make sure you discuss any questions you have with your health care provider.   Document Released: 04/17/2005 Document Revised: 05/08/2014 Document Reviewed: 02/19/2013 Elsevier Interactive Patient Education 2016 [place] for Peptic Ulcer Disease When you have peptic ulcer disease, the foods you eat and your eating habits are very important. Choosing the right foods can help ease the discomfort of peptic ulcer disease. WHAT GENERAL GUIDELINES DO I NEED TO FOLLOW?  Choose fruits, vegetables, whole grains, and low-fat meat, fish, and poultry.   Keep a food diary to identify foods that cause symptoms.  Avoid foods that cause irritation or pain. These may be different for different people.  Eat frequent small meals instead of three large meals each day. The pain may be worse when your stomach is empty.  Avoid eating close  to bedtime. WHAT FOODS ARE NOT RECOMMENDED? The following are some foods and drinks that may worsen your symptoms:  Black, white, and red pepper.  Hot  sauce.  Chili peppers.  Chili powder.  Chocolate and cocoa.   Alcohol.  Tea, coffee, and cola (regular and decaffeinated). The items listed above may not be a complete list of foods and beverages to avoid. Contact your dietitian for more information.   This information is not intended to replace advice given to you by your health care provider. Make sure you discuss any questions you have with your health care provider.   Document Released: 07/10/2011 Document Revised: 04/22/2013 Document Reviewed: 02/19/2013 Elsevier Interactive Patient Education [employer].

## 2016-02-03 LAB — H. PYLORI BREATH TEST: H. pylori Breath Test: NOT DETECTED

## 2016-02-10 ENCOUNTER — Encounter: Payer: Self-pay | Admitting: Family Medicine

## 2016-02-14 DIAGNOSIS — M533 Sacrococcygeal disorders, not elsewhere classified: Secondary | ICD-10-CM | POA: Diagnosis not present

## 2016-02-15 DIAGNOSIS — M4326 Fusion of spine, lumbar region: Secondary | ICD-10-CM | POA: Diagnosis not present

## 2016-02-15 DIAGNOSIS — M533 Sacrococcygeal disorders, not elsewhere classified: Secondary | ICD-10-CM | POA: Diagnosis not present

## 2016-02-15 NOTE — Telephone Encounter (Signed)
Dr Brigitte Pulse, I pended both zantac and omeprazole, but couldn't tell by your OV notes whether you intended to keep Rxing the zantac also, or just omeprazole now. Please send what you want pt to be taking. Thanks!

## 2016-02-16 MED ORDER — RANITIDINE HCL 150 MG PO TABS: 150.0000 mg | ORAL_TABLET | Freq: Two times a day (BID) | ORAL | 3 refills | Status: DC

## 2016-02-16 MED ORDER — OMEPRAZOLE 40 MG PO CPDR: 40.0000 mg | DELAYED_RELEASE_CAPSULE | Freq: Every day | ORAL | 3 refills | Status: DC

## 2016-02-17 ENCOUNTER — Encounter: Payer: Self-pay | Admitting: Family Medicine

## 2016-02-17 ENCOUNTER — Other Ambulatory Visit: Payer: Self-pay | Admitting: Orthopedic Surgery

## 2016-02-17 ENCOUNTER — Other Ambulatory Visit: Payer: Self-pay

## 2016-02-17 DIAGNOSIS — M533 Sacrococcygeal disorders, not elsewhere classified: Secondary | ICD-10-CM

## 2016-02-17 NOTE — Telephone Encounter (Signed)
Pt would like Dr. Brigitte Pulse to be aware that a form is being faxed to her from [facility] for her to fill out, so she can get free meals for her chronic conditions.  Please advise at [phone]

## 2016-02-18 MED ORDER — DIAZEPAM 5 MG PO TABS: ORAL_TABLET | ORAL | 0 refills | Status: DC

## 2016-02-18 NOTE — Telephone Encounter (Signed)
I faxed diazepam Rx. Dr Brigitte Pulse, did you see the req for crestor also? I don't see that you have been Rxing this or a recent lipid panel so wanted to check with you before sending Rx.

## 2016-02-18 NOTE — Telephone Encounter (Signed)
Checked [facility] - last rx'ed by Dr. Earley Abide #30 tabs in June.  Refill printed

## 2016-02-18 NOTE — Telephone Encounter (Signed)
Last OV on 10/4, but don't see diazepam discussed recently. Last RF appears to be Sept of 2016.

## 2016-02-18 NOTE — Telephone Encounter (Signed)
Received faxed form with diazepam and rosuvastatin to be ordered. I will put this in Dr Raul Del box.

## 2016-02-21 NOTE — Telephone Encounter (Signed)
Diazepam Rx has already been sent to [facility].

## 2016-02-22 MED ORDER — ROSUVASTATIN CALCIUM 20 MG PO TABS: 20.0000 mg | ORAL_TABLET | Freq: Every day | ORAL | 3 refills | Status: DC

## 2016-02-22 NOTE — Telephone Encounter (Signed)
Please let pt know that I do not have a lipid panel on her in 2 years so at her next routine visit, she should come fasting or if she has had it done elsewhere we can have it faxed over. Refilled crestor

## 2016-02-23 DIAGNOSIS — M4326 Fusion of spine, lumbar region: Secondary | ICD-10-CM | POA: Diagnosis not present

## 2016-02-23 DIAGNOSIS — M533 Sacrococcygeal disorders, not elsewhere classified: Secondary | ICD-10-CM | POA: Diagnosis not present

## 2016-02-25 ENCOUNTER — Encounter: Payer: Self-pay | Admitting: Family Medicine

## 2016-02-25 DIAGNOSIS — M533 Sacrococcygeal disorders, not elsewhere classified: Secondary | ICD-10-CM | POA: Diagnosis not present

## 2016-02-25 DIAGNOSIS — M4326 Fusion of spine, lumbar region: Secondary | ICD-10-CM | POA: Diagnosis not present

## 2016-02-25 NOTE — Telephone Encounter (Signed)
Notified pt about need for fasting labs at next OV and pt agreed. She also asked if we have gotten a form that [facility] has sent over 3 times to allow pt to get 3 free meals a day? I don't see any notes about this. Dr Brigitte Pulse, have you seen/ signed this form?

## 2016-02-28 ENCOUNTER — Ambulatory Visit
Admission: RE | Admit: 2016-02-28 | Discharge: 2016-02-28 | Disposition: A | Discharge status: Home / Self Care | Payer: Medicare HMO | Source: Ambulatory Visit | Attending: Orthopedic Surgery | Admitting: Orthopedic Surgery

## 2016-02-28 DIAGNOSIS — M533 Sacrococcygeal disorders, not elsewhere classified: Secondary | ICD-10-CM

## 2016-02-28 DIAGNOSIS — M461 Sacroiliitis, not elsewhere classified: Secondary | ICD-10-CM | POA: Diagnosis not present

## 2016-02-28 NOTE — Telephone Encounter (Signed)
Dr Brigitte Pulse, have you received this fax? I have not seen it cross my desk.

## 2016-02-29 DIAGNOSIS — M533 Sacrococcygeal disorders, not elsewhere classified: Secondary | ICD-10-CM | POA: Diagnosis not present

## 2016-02-29 DIAGNOSIS — M4326 Fusion of spine, lumbar region: Secondary | ICD-10-CM | POA: Diagnosis not present

## 2016-03-02 ENCOUNTER — Encounter: Payer: Self-pay | Admitting: Family Medicine

## 2016-03-02 NOTE — Telephone Encounter (Signed)
Dr Brigitte Pulse, I still have not seen this form. I'm hoping someone put it directly in your box and you have it?

## 2016-03-03 DIAGNOSIS — M4326 Fusion of spine, lumbar region: Secondary | ICD-10-CM | POA: Diagnosis not present

## 2016-03-03 DIAGNOSIS — M533 Sacrococcygeal disorders, not elsewhere classified: Secondary | ICD-10-CM | POA: Diagnosis not present

## 2016-03-08 DIAGNOSIS — G8929 Other chronic pain: Secondary | ICD-10-CM | POA: Diagnosis not present

## 2016-03-08 DIAGNOSIS — M5136 Other intervertebral disc degeneration, lumbar region: Secondary | ICD-10-CM | POA: Diagnosis not present

## 2016-03-08 DIAGNOSIS — M545 Low back pain: Secondary | ICD-10-CM | POA: Diagnosis not present

## 2016-03-08 DIAGNOSIS — Z79891 Long term (current) use of opiate analgesic: Secondary | ICD-10-CM | POA: Diagnosis not present

## 2016-03-13 DIAGNOSIS — M533 Sacrococcygeal disorders, not elsewhere classified: Secondary | ICD-10-CM | POA: Diagnosis not present

## 2016-03-16 ENCOUNTER — Ambulatory Visit: Payer: Medicare HMO | Attending: Orthopedic Surgery | Admitting: Physical Therapy

## 2016-03-16 DIAGNOSIS — M5441 Lumbago with sciatica, right side: Secondary | ICD-10-CM | POA: Insufficient documentation

## 2016-03-16 DIAGNOSIS — G8929 Other chronic pain: Secondary | ICD-10-CM | POA: Diagnosis not present

## 2016-03-16 DIAGNOSIS — M533 Sacrococcygeal disorders, not elsewhere classified: Secondary | ICD-10-CM

## 2016-03-16 DIAGNOSIS — M6281 Muscle weakness (generalized): Secondary | ICD-10-CM | POA: Diagnosis not present

## 2016-03-16 NOTE — Therapy (Addendum)
[facility] [address] [place], [place], [address] Phone: [phone]   Fax:  [phone]  Physical Therapy Evaluation/Discharge Summary  Patient Details  Name: [name] MRN: [id number] Date of Birth: [birth year] Referring Provider: Dr. Lynann Bologna  Encounter Date: 03/16/2016      PT End of Session - 03/16/16 1818    Visit Number 1   Date for PT Re-Evaluation 05/11/16   Authorization Type Medicare G codes; KX at visit 15   PT Start Time 0915   PT Stop Time 1000   PT Time Calculation (min) 45 min   Activity Tolerance Patient limited by pain      Past Medical History:  Diagnosis Date  . Allergy   . Anxiety   . Arthritis   . Back pain, chronic   . Bipolar disorder ([place])   . Bronchitis    hx-no inhalers at home  . Carotid bruit   . Carpal tunnel syndrome on both sides   . Coronary artery disease   . DDD (degenerative disc disease), lumbar   . Depression   . Diabetes mellitus    diet controlled  . Ejection fraction    .  Marland Kitchen GERD (gastroesophageal reflux disease)   . Hx of CABG   . Hypercholesteremia   . Hypertension   . Hypothyroidism   . Multiple thyroid nodules   . Myocardial infarction 11/20/06  . Neuromuscular disorder ([place])   . Nodule of neck     Past Surgical History:  Procedure Laterality Date  . ABDOMINAL HYSTERECTOMY  1984  . BREAST SURGERY     rt br cystx2  . CORONARY ARTERY BYPASS GRAFT  2008  . KNEE SURGERY  2009   lt   . ORIF ANKLE FRACTURE Right 07/12/2012   Procedure: OPEN REDUCTION INTERNAL FIXATION (ORIF) RIGHT ANKLE FRACTURE;  Surgeon: Alta Corning, MD;  Location: [facility];  Service: Orthopedics;  Laterality: Right;  . TUBAL LIGATION    . WRIST SURGERY Left     There were no vitals filed for this visit.       Subjective Assessment - 03/16/16 0926    Subjective 2001 got hurt injuring back;  severe right LE pain;  pain worsened so underwent  L4-5 fusion 10/21/15;  since surgery increased bilateral SI joint, groin pain and right anterior thigh to just below knee.  My hands have been numb for a while.  Spasms go across my hip.  Upcoming SI joint injections.  Patient has extreme pain behaviors with rising to stand and while walking in the clinic.  Patient states she is really flared up today after attending a birthday party for her dad and being on her feet.  "I've stopped going to church because I can't sit."    Pertinent History Diabetes, CABG;  HTN;  pinch nerves in neck and elbow, bilateral CTS; fall fracture right ankle; fall fracture "tailbone"   Limitations House hold activities;Walking;Writing;Standing;Lifting   How long can you sit comfortably? 15 min   How long can you walk comfortably? 10 min   Diagnostic tests MRI prior to surgery   Patient Stated Goals give me ex's for my SI joint so I don't have to take these other medicines    Currently in Pain? Yes   Pain Score 9    Pain Location Hip   Pain Orientation Right   Pain Type Chronic pain   Pain Onset More than a month ago   Pain Frequency  Constant   Aggravating Factors  walking, lifting   Pain Relieving Factors lying on left side or right side;    Effect of Pain on Daily Activities has a home TENS unit but it doesn't help much            [facility] PT Assessment - 03/16/16 0001      Assessment   Medical Diagnosis low back pain s/p L4-5 fusion   Referring Provider Dr. Lynann Bologna   Onset Date/Surgical Date 10/21/15   Hand Dominance Right   Next MD Visit not scheduled   Prior Therapy tried it at Dr. Laurena Bering after surgery for 6-7 visits but too painful clams and doing leg raises on her back      Precautions   Precautions Other (comment)   Precaution Comments no lifting > 5#     Restrictions   Weight Bearing Restrictions No     Balance Screen   Has the patient fallen in the past 6 months Yes   How many times? "many times"  my right foot makes me trip   Has the  patient had a decrease in activity level because of a fear of falling?  Yes   Is the patient reluctant to leave their home because of a fear of falling?  No     Home Environment   Living Environment Private residence   Living Arrangements Spouse/significant other   Type of [place] entrance   [place] - quad;Cane - single point     Prior Function   Vocation On disability   Vocation Requirements on disability "for a lot of things"     Observation/Other Assessments   Focus on Therapeutic Outcomes (FOTO)  60% limitation      Posture/Postural Control   Posture/Postural Control Postural limitations   Postural Limitations Decreased lumbar lordosis;Left pelvic obliquity   Posture Comments patient reports right leg is longer;  hips shifted left     ROM / Strength   AROM / PROM / Strength AROM     AROM   Lumbar Flexion 50   Lumbar Extension 10   Lumbar - Right Side Bend 20   Lumbar - Left Side Bend 20     PROM   PROM Assessment Site Hip   Right/Left Hip Right   Right Hip External Rotation  40  painful   Right Hip Internal Rotation  10     Strength   Strength Assessment Site Lumbar   Right/Left Hip Right;Left   Right Hip Flexion 3+/5  painful   Right Hip Extension 3+/5   Right Hip ABduction 3/5   Left Hip Flexion 4-/5   Left Hip Extension 4-/5   Left Hip ABduction 4-/5   Right/Left Knee Right;Left   Right Knee Flexion 3+/5  painful   Right Knee Extension 3+/5  painful   Left Knee Flexion 4-/5   Left Knee Extension 4-/5   Right/Left Ankle Right;Left   Right Ankle Dorsiflexion 4-/5   Right Ankle Plantar Flexion 4-/5   Right Ankle Inversion 4-/5   Right Ankle Eversion 4-/5   Left Ankle Dorsiflexion 4/5   Left Ankle Plantar Flexion 4/5   Left Ankle Inversion 4/5   Left Ankle Eversion 4/5   Lumbar Flexion 3/5   Lumbar Extension 3/5     Flexibility   Soft Tissue Assessment /Muscle Length yes   Hamstrings  left 60 degrees, right 45 degrees  Palpation   Palpation comment tenderness right SI joint     Special Tests    Special Tests Hip Special Tests   Sacroiliac Tests  Pelvic Compression   Hip Special Tests  Hip Scouring     Pelvic Dictraction   Findings Negative     Pelvic Compression   Findings Negative     Hip Scouring   Findings Negative                           PT Education - 03/16/16 1817    Education provided Yes   Education Details sidelying self resisted clams, sidelying abdominal brace;  SI belt info   Person(s) Educated Patient   Methods Explanation;Demonstration;Handout   Comprehension Verbalized understanding;Returned demonstration          PT Short Term Goals - 03/16/16 2217      PT SHORT TERM GOAL #1   Title The patient will demonstrate knowledge of basic self care strategies for pain relief and basic stabilization/core exercises 04/13/16   Time 4   Period Weeks   Status New     PT SHORT TERM GOAL #2   Title The patient will report a 25% improvement in pain with usual ADLs including walking and sitting   Time 4   Period Weeks   Status New           PT Long Term Goals - 03/16/16 2219      PT LONG TERM GOAL #1   Title The patient will be independent in a HEP for core, SI and hip stabilization and strengthening program 05/11/16   Time 8   Period Weeks   Status New     PT LONG TERM GOAL #2   Title The patient will have improved abdominal, trunk extensor and right hip strength to grossly 4-/5 needed for walking, standing and lifting >5#   Time 8   Period Weeks   Status New     PT LONG TERM GOAL #3   Title The patient will report a 50% improvement in pain level with usual ADLs and decreased medicine usage for pain management    Time 8   Period Weeks   Status New     PT LONG TERM GOAL #4   Title The patient will be able to walk 15-20 minutes needed for grocery shopping    Time 8   Period Weeks   Status New     PT  LONG TERM GOAL #5   Title FOTO functional outcome score improved from 60% limitation to 48% limitation indicating improved function with less pain   Time 8   Period Weeks   Status New               Plan - 03/16/16 2159    Clinical Impression Statement The patient complains of severe bilateral SI joint pain (right > left), right groin pain and right anterior thigh pain.  She is s/p posterior fusion of L4-5 on 10/21/15.  She reports her pain has intensified since that time.  Her pain is worsened with walking, lifting and sitting.  Somewhat better with sidelying.  She was discharged from outpatient PT at the doctor's office secondary to increased (primarily supine ex's).  She has a TENS unit which gives her minimal relief.  Currently she complains of 9/10 pain with grimacing and bracing in all positions.  Tenderness right SI joint line.  Painful and limted lumbar ROM, painful with  right hip external rotation. Markedly decreased right hip abduction strength as 3-/5, otherwise hip, knee and ankle strength 3+/5.  Left LE grossly 4-/5.  Core/trunk muscle weakness 3/5.  No significant changes with SI special tests.  She complains of severe muscle spasms that cause her to stagger and stop several times in the clinic.  The patient would benefit from PT to address these deficits.  She states she lives 50 minutes from this clinic which will potentially increase her pain.  Discussed other [facility] satellite clinics which would be significantly closer to her home.  She does not wish to schedule anywhere at this time until after next week (Thanksgiving).  She was given contact numbers for this facility and clinic in [place] and she will call to schedule.  The patient is of moderate complexity secondary to evolving status with multiple body parts affected and physical limitations as well as numerous co-morbidities including cardiac disease, diabetes, HTN, hx of right ankle fracture, coccyx fracture and chronic pain  which will affect her rehabilitation.     Rehab Potential Good   Clinical Impairments Affecting Rehab Potential pain severity;  co-morbidities;  lack of progress with previous PT;  5# lifting limit   PT Frequency 2x / week   PT Duration 8 weeks   PT Treatment/Interventions ADLs/Self Care Home Management;Cryotherapy;Electrical Stimulation;Iontophoresis 66m/ml Dexamethasone;Ultrasound;Moist Heat;Therapeutic exercise;Patient/family education;Manual techniques;Dry needling;Taping   PT Next Visit Plan ionto to right SI joint if cert signed by MD; low level core and hip strengthening in sidelying (best position);  taping for SI support;    PT Home Exercise Plan sidelying abdominal brace; clams   Recommended Other Services patient given info for SI-Loc belt      Patient will benefit from skilled therapeutic intervention in order to improve the following deficits and impairments:  Decreased activity tolerance, Decreased range of motion, Decreased strength, Difficulty walking, Impaired flexibility, Pain, Increased muscle spasms  Visit Diagnosis: Chronic bilateral low back pain with right-sided sciatica - Plan: PT plan of care cert/re-cert  Sacrococcygeal disorders, not elsewhere classified - Plan: PT plan of care cert/re-cert  Muscle weakness (generalized) - Plan: PT plan of care cert/re-cert      G-Codes - 161/60/732February 29, 2224   Functional Assessment Tool Used FOTO; clinical judgement    Functional Limitation Mobility: Walking and moving around   Mobility: Walking and Moving Around Current Status [phone] At least 60 percent but less than 80 percent impaired, limited or restricted   Mobility: Walking and Moving Around Goal Status ([phone] At least 40 percent but less than 60 percent impaired, limited or restricted     PHYSICAL THERAPY DISCHARGE SUMMARY  Visits from Start of Care: 1  Current functional level related to goals / functional outcomes: The patient did not return after initial evaluation  and her chart has been inactive for 2 months.  Will discharge from PT.   Remaining deficits: As above   Education / Equipment: Minimal education Plan:                                                    Patient goals were not met. Patient is being discharged due to not returning since the last visit.  ?????         Problem List Patient Active Problem List   Diagnosis Date Noted  . Radiculopathy 10/21/2015  .  Diabetes ([place]) 07/29/2015  . 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 Multinodular goiter 09/15/2013  . Carotid bruit   . Depression   . Coronary artery disease   . Hypertension   . Hyperlipidemia   . Neuromuscular disorder ([place])   . Arthritis   . DDD (degenerative disc disease), lumbar   . Ejection fraction   . Hx of CABG   . Glucose intolerance (pre-diabetes) 02/08/2012  . Encounter for long-term (current) use of other medications 02/08/2012  . Spinal stenosis of lumbar region 02/08/2012  . Neuropathy ([place]) 02/08/2012  . Superficial phlebitis and thrombophlebitis of the leg 04/09/2011  . Back pain, chronic   . TOBACCO ABUSE 11/20/2008   Ruben Im, PT 03/16/16 10:27 PM Phone: [phone] Fax: [phone]  Alvera Singh 03/16/2016, 10:27 PM  [facility] [address] [place], [place], [address] Phone: [phone]   Fax:  [phone]  Name: [name] MRN: [id number] Date of Birth: 09-13-1960

## 2016-03-16 NOTE — Patient Instructions (Signed)
   "  SI Loc"    Check Amazon    Sidelying:  Abdominal brace      5 sec hold 5x                   Self resisted clam   5 sec hold 5x   [facility] [address] [phone]    [facility] [address], [place], [place] [address] Phone # [phone] Fax [phone]

## 2016-03-17 DIAGNOSIS — M47818 Spondylosis without myelopathy or radiculopathy, sacral and sacrococcygeal region: Secondary | ICD-10-CM | POA: Diagnosis not present

## 2016-03-17 DIAGNOSIS — Z716 Tobacco abuse counseling: Secondary | ICD-10-CM | POA: Diagnosis not present

## 2016-03-17 DIAGNOSIS — F17210 Nicotine dependence, cigarettes, uncomplicated: Secondary | ICD-10-CM | POA: Diagnosis not present

## 2016-03-17 DIAGNOSIS — M47816 Spondylosis without myelopathy or radiculopathy, lumbar region: Secondary | ICD-10-CM | POA: Diagnosis not present

## 2016-03-21 ENCOUNTER — Encounter: Payer: Self-pay | Admitting: Family Medicine

## 2016-03-24 NOTE — Telephone Encounter (Signed)
Dr Brigitte Pulse, see previous two emails. Have you ever gotten any of these faxes? Please let pt know. I have not seen any yet but hoped someone had put it directly in your box?

## 2016-04-06 DIAGNOSIS — M47818 Spondylosis without myelopathy or radiculopathy, sacral and sacrococcygeal region: Secondary | ICD-10-CM | POA: Diagnosis not present

## 2016-04-06 DIAGNOSIS — M47816 Spondylosis without myelopathy or radiculopathy, lumbar region: Secondary | ICD-10-CM | POA: Diagnosis not present

## 2016-04-14 DIAGNOSIS — F319 Bipolar disorder, unspecified: Secondary | ICD-10-CM | POA: Diagnosis not present

## 2016-05-02 DIAGNOSIS — M47816 Spondylosis without myelopathy or radiculopathy, lumbar region: Secondary | ICD-10-CM | POA: Diagnosis not present

## 2016-05-02 DIAGNOSIS — M47818 Spondylosis without myelopathy or radiculopathy, sacral and sacrococcygeal region: Secondary | ICD-10-CM | POA: Diagnosis not present

## 2016-05-04 DIAGNOSIS — M545 Low back pain: Secondary | ICD-10-CM | POA: Diagnosis not present

## 2016-05-04 DIAGNOSIS — Z79891 Long term (current) use of opiate analgesic: Secondary | ICD-10-CM | POA: Diagnosis not present

## 2016-05-04 DIAGNOSIS — M5136 Other intervertebral disc degeneration, lumbar region: Secondary | ICD-10-CM | POA: Diagnosis not present

## 2016-05-04 DIAGNOSIS — G8929 Other chronic pain: Secondary | ICD-10-CM | POA: Diagnosis not present

## 2016-05-09 ENCOUNTER — Ambulatory Visit (INDEPENDENT_AMBULATORY_CARE_PROVIDER_SITE_OTHER): Payer: Medicare HMO | Admitting: Family Medicine

## 2016-05-09 VITALS — BP 108/68 | HR 64 | Temp 97.8°F | Resp 17 | Ht 62.5 in | Wt 167.0 lb

## 2016-05-09 DIAGNOSIS — E119 Type 2 diabetes mellitus without complications: Secondary | ICD-10-CM

## 2016-05-09 DIAGNOSIS — M5417 Radiculopathy, lumbosacral region: Secondary | ICD-10-CM | POA: Diagnosis not present

## 2016-05-09 DIAGNOSIS — Z5181 Encounter for therapeutic drug level monitoring: Secondary | ICD-10-CM

## 2016-05-09 DIAGNOSIS — M461 Sacroiliitis, not elsewhere classified: Secondary | ICD-10-CM

## 2016-05-09 DIAGNOSIS — M62830 Muscle spasm of back: Secondary | ICD-10-CM | POA: Diagnosis not present

## 2016-05-09 DIAGNOSIS — M5431 Sciatica, right side: Secondary | ICD-10-CM

## 2016-05-09 DIAGNOSIS — G894 Chronic pain syndrome: Secondary | ICD-10-CM

## 2016-05-09 DIAGNOSIS — M533 Sacrococcygeal disorders, not elsewhere classified: Secondary | ICD-10-CM | POA: Diagnosis not present

## 2016-05-09 LAB — POCT SEDIMENTATION RATE: POCT SED RATE: 31 mm/hr — AB (ref 0–22)

## 2016-05-09 MED ORDER — OXYCODONE-ACETAMINOPHEN 10-325 MG PO TABS: 1.0000 | ORAL_TABLET | ORAL | 0 refills | Status: DC | PRN

## 2016-05-09 MED ORDER — DIAZEPAM 5 MG PO TABS: 5.0000 mg | ORAL_TABLET | Freq: Three times a day (TID) | ORAL | 0 refills | Status: DC | PRN

## 2016-05-09 NOTE — Patient Instructions (Addendum)
Apply lots of heat to the muscle spasm - 15 minutes every 2 hours.  Try to slowly move and stretch as much as you are able.  Never take the valium and the oxycodone within 4 hours of each other.  This is the most common cause of UNINTENTIONAL overdose and valium will last in your system for 3 days.  If you are not getting any improvement by Friday, follow-up with me on Saturday.  If you are getting worse, come back or to the ER immed.  IF you received an x-ray today, you will receive an invoice from [facility]. Please contact [facility] at [phone] with questions or concerns regarding your invoice.   IF you received labwork today, you will receive an invoice from [facility]. Please contact [facility] at [phone] with questions or concerns regarding your invoice.   Our billing staff will not be able to assist you with questions regarding bills from these companies.  You will be contacted with the lab results as soon as they are available. The fastest way to get your results is to activate your My Chart account. Instructions are located on the last page of this paperwork. If you have not heard from [place] regarding the results in 2 weeks, please contact this office.    Muscle Cramps and Spasms Muscle cramps and spasms occur when a muscle or muscles tighten and you have no control over this tightening (involuntary muscle contraction). They are a common problem and can develop in any muscle. The most common place is in the calf muscles of the leg. Both muscle cramps and muscle spasms are involuntary muscle contractions, but they also have differences:   Muscle cramps are sporadic and painful. They may last a few seconds to a quarter of an hour. Muscle cramps are often more forceful and last longer than muscle spasms.  Muscle spasms may or may not be painful. They may also last just a few seconds or much longer. CAUSES  It is uncommon for cramps or spasms to be due to a serious  underlying problem. In many cases, the cause of cramps or spasms is unknown. Some common causes are:   Overexertion.   Overuse from repetitive motions (doing the same thing over and over).   Remaining in a certain position for a long period of time.   Improper preparation, form, or technique while performing a sport or activity.   Dehydration.   Injury.   Side effects of some medicines.   Abnormally low levels of the salts and ions in your blood (electrolytes), especially potassium and calcium. This could happen if you are taking water pills (diuretics) or you are pregnant.  Some underlying medical problems can make it more likely to develop cramps or spasms. These include, but are not limited to:   Diabetes.   Parkinson disease.   Hormone disorders, such as thyroid problems.   Alcohol abuse.   Diseases specific to muscles, joints, and bones.   Blood vessel disease where not enough blood is getting to the muscles.  HOME CARE INSTRUCTIONS   Stay well hydrated. Drink enough water and fluids to keep your urine clear or pale yellow.  It may be helpful to massage, stretch, and relax the affected muscle.  For tight or tense muscles, use a warm towel, heating pad, or hot shower water directed to the affected area.  If you are sore or have pain after a cramp or spasm, applying ice to the affected area may relieve discomfort.  Put ice in a plastic bag.  Place a towel between your skin and the bag.  Leave the ice on for 15-20 minutes, 3-4 times a day.  Medicines used to treat a known cause of cramps or spasms may help reduce their frequency or severity. Only take over-the-counter or prescription medicines as directed by your caregiver. SEEK MEDICAL CARE IF:  Your cramps or spasms get more severe, more frequent, or do not improve over time.  MAKE SURE YOU:   Understand these instructions.  Will watch your condition.  Will get help right away if you are not  doing well or get worse. This information is not intended to replace advice given to you by your health care provider. Make sure you discuss any questions you have with your health care provider. Document Released: 10/07/2001 Document Revised: 08/12/2012 Document Reviewed: 01/19/2015 Elsevier Interactive Patient Education  2017 [employer].

## 2016-05-09 NOTE — Progress Notes (Signed)
Subjective:  By signing my name below, I, [name], attest that this documentation has been prepared under the direction and in the presence of [name], MD Electronically Signed: Ladene Artist, ED Scribe 05/09/2016 at 12:19 PM.   Patient ID: [name], female    DOB: [birth year], 56 y.o.   MRN: [id number]  Chief Complaint  Patient presents with  . Back Pain   HPI HPI Comments: [name] is a 56 y.o. female, with a h/o lumbar DDD, who presents Primary Care at [facility] complaining of acute on chronic back pain that started 5 days ago. Pt uses diazepam 5 mg for back spasms. She follows with pain management, suspects Dr. Nelva Bush. She is trying to stop smoking. Follows with Dr. Loanne Drilling for hypothyroidism and mild Type II DM. Pt had right sided lumbar 4-5 transforaminal lumbar interbody fusion with instrumentation and allograft on 10/21/15 but has had increased bilateral SI joint pain and groin pain that radiates to the knee. She has an appointment with her cardiologist on 1/30 and her endocrinologist on 2/2. Her last renal function was checked 6 months prior.   Today, she states that she recently had an SI injection on the right on 05/04/16 by Dr. Jacelyn Grip. She states that the injection provided temporary relief but noticed intense pressure on the left a few days later. She tried to call Dr. Jodi Mourning office but states that she could not be seen sooner than her appointment on 1/15. Today, she reports constant low back pain that radiates into her right buttock and right thigh that is exacerbated with standing and sitting upright. She has also noticed swelling to her low back over the past few days. Pt states that she has not slept over the past 3 days due to severity of pain. She reports taking only half trazodone due to also taking her pain medications and valium twice daily. She has also been taking gabapentin twice daily and Percocet 10 every 6 hours without significant relief. Pt  denies bladder/bowel incontinence and loss of appetite. She has checked her blood glucose regularly with average ranges of 108-118.   Past Medical History:  Diagnosis Date  . Allergy   . Anxiety   . Arthritis   . Back pain, chronic   . Bipolar disorder ([place])   . Bronchitis    hx-no inhalers at home  . Carotid bruit   . Carpal tunnel syndrome on both sides   . Coronary artery disease   . DDD (degenerative disc disease), lumbar   . Depression   . Diabetes mellitus    diet controlled  . Ejection fraction    .  Marland Kitchen GERD (gastroesophageal reflux disease)   . Hx of CABG   . Hypercholesteremia   . Hypertension   . Hypothyroidism   . Multiple thyroid nodules   . Myocardial infarction 11/20/06  . Neuromuscular disorder ([place])   . Nodule of neck    Current Outpatient Prescriptions on File Prior to Visit  Medication Sig Dispense Refill  . ACCU-CHEK AVIVA PLUS test strip Use to check blood sugar 3 times daily. 300 each 2  . aspirin EC 81 MG tablet Take 81 mg by mouth 2 (two) times daily.    . bromocriptine (PARLODEL) 2.5 MG tablet 1/4 tab daily 25 tablet 3  . carvedilol (COREG) 3.125 MG tablet TAKE 1 TABLET (3.125 MG TOTAL) BY MOUTH 2 (TWO) TIMES DAILY. 60 tablet 2  . diazepam (VALIUM) 5 MG tablet TAKE 1 TABLET BY MOUTH  EVERY TWELVE HOURS AS NEEDED FOR MUSCLE SPASM OR SEDATION 30 tablet 0  . DULoxetine (CYMBALTA) 60 MG capsule Take 60 mg by mouth 2 (two) times daily.    Marland Kitchen gabapentin (NEURONTIN) 400 MG capsule Take 800 mg by mouth 3 (three) times daily.     . Ipratropium-Albuterol (COMBIVENT RESPIMAT) 20-100 MCG/ACT AERS respimat Inhale 1 puff into the lungs every 6 (six) hours. (Patient taking differently: Inhale 1 puff into the lungs as needed. ) 4 g 1  . levothyroxine (SYNTHROID, LEVOTHROID) 75 MCG tablet TAKE 1 TABLET (75 MCG TOTAL) BY MOUTH DAILY BEFORE BREAKFAST. (Patient taking differently: Take 75 mcg by mouth daily before breakfast. ) 90 tablet 2  . metFORMIN (GLUCOPHAGE-XR) 500 MG  24 hr tablet Take 2 tablets (1,000 mg total) by mouth daily with breakfast. 180 tablet 3  . omeprazole (PRILOSEC) 40 MG capsule Take 1 capsule (40 mg total) by mouth daily. 30 minutes before a meal 90 capsule 3  . oxyCODONE-acetaminophen (PERCOCET) 10-325 MG tablet Take 1 tablet by mouth every 6 (six) hours as needed for pain.    Marland Kitchen QUEtiapine (SEROQUEL) 200 MG tablet Take 200 mg by mouth 3 (three) times daily.    . ranitidine (ZANTAC) 150 MG tablet Take 1 tablet (150 mg total) by mouth 2 (two) times daily. 180 tablet 3  . rosuvastatin (CRESTOR) 20 MG tablet Take 1 tablet (20 mg total) by mouth daily. 90 tablet 3  . sitaGLIPtin (JANUVIA) 100 MG tablet Take 1 tablet (100 mg total) by mouth daily. 90 tablet 3  . traZODone (DESYREL) 50 MG tablet Take 50 mg by mouth at bedtime as needed for sleep.     Marland Kitchen buPROPion (WELLBUTRIN SR) 150 MG 12 hr tablet Take 150 mg by mouth every morning.      No current facility-administered medications on file prior to visit.    Allergies  Allergen Reactions  . Coconut Flavor Swelling and Rash  . Ibuprofen Swelling and Rash  . Prednisone Swelling and Rash  . Cyclobenzaprine   . Morphine Nausea And Vomiting   Review of Systems  Constitutional: Positive for activity change, diaphoresis and fatigue. Negative for appetite change, chills and fever.  Gastrointestinal: Negative for constipation and diarrhea.  Genitourinary: Negative for decreased urine volume, difficulty urinating and urgency.  Musculoskeletal: Positive for arthralgias, back pain, gait problem, joint swelling and myalgias.  Skin: Negative for color change, pallor, rash and wound.  Neurological: Positive for weakness and numbness.  Hematological: Negative for adenopathy.  Psychiatric/Behavioral: Positive for sleep disturbance.  see hpi    Objective:   Physical Exam  Constitutional: She is oriented to person, place, and time. She appears well-developed and well-nourished. No distress.  HENT:  Head:  Normocephalic and atraumatic.  Eyes: Conjunctivae and EOM are normal.  Neck: Neck supple. No tracheal deviation present.  Cardiovascular: Normal rate.   Pulmonary/Chest: Effort normal. No respiratory distress.  Musculoskeletal: Normal range of motion.  Palpable muscle spasm radiating, much worse R than L, from L5 S1 laterally across R buttock. Severely tender to palpation. Pain with extension of both legs, R worse than L. No true straight leg raise.   Neurological: She is alert and oriented to person, place, and time.  Reflex Scores:      Patellar reflexes are 3+ on the left side.      Achilles reflexes are 2+ on the left side. Unable to illicit achilles on the R  Skin: Skin is warm and dry.  Psychiatric: She has a  normal mood and affect. Her behavior is normal.  Nursing note and vitals reviewed.  BP 108/68 (BP Location: Right Arm, Patient Position: Sitting, Cuff Size: Normal)   Pulse 64   Temp 97.8 F (36.6 C) (Oral)   Resp 17   Ht 5' 2.5" (1.588 m)   Wt 167 lb (75.8 kg)   SpO2 94%   BMI 30.06 kg/m     Assessment & Plan:   1. Spasm of muscle, back   2. Right sciatic nerve pain   3. Sacroiliac joint dysfunction of right side   4. SI (sacroiliac) joint inflammation (HCC)   5. Chronic pain syndrome   6. Type 2 diabetes mellitus without complication, without long-term current use of insulin (HCC)   7. Lumbosacral radiculopathy   8. Medication monitoring encounter   Pt is in pain management at [facility] in [place] but she is clearly in agony today. I rec pt to increase her oxycodone and have called her pain clinic to inform them - she did not request an increase but I have known pt for >6 yrs and have never seen her in such agony.  We can't use prednisone due to DM so try below first. Recent steroid inj so wbc will likely be altered but check inflammatory labs to help r/o developing infectious complication. To ER if sxs begin to worsen.  Orders Placed  This Encounter  Procedures  . C-reactive protein  . Comprehensive metabolic panel  . POCT SEDIMENTATION RATE    Meds ordered this encounter  Medications  . oxyCODONE-acetaminophen (PERCOCET) 10-325 MG tablet    Sig: Take 1-2 tablets by mouth every 4 (four) hours as needed for pain.    Dispense:  60 tablet    Refill:  0    I am aware of patient's other prescribers' of her pain medication. This is for an acute issue needing change in SIG  . diazepam (VALIUM) 5 MG tablet    Sig: Take 1-2 tablets (5-10 mg total) by mouth every 8 (eight) hours as needed for anxiety. Make sure to separate this by 4 hours from any narcotic such as the oxycodone.    Dispense:  60 tablet    Refill:  0    I personally performed the services described in this documentation, which was scribed in my presence. The recorded information has been reviewed and considered, and addended by me as needed.   [name], M.D.  [facility] [address] [place], [place] [address] [phone] phone [phone] fax  05/28/16 5:12 AM  Results for orders placed or performed in visit on 05/09/16  C-reactive protein  Result Value Ref Range   CRP 5.6 (H) 0.0 - 4.9 mg/L  Comprehensive metabolic panel  Result Value Ref Range   Glucose 89 65 - 99 mg/dL   BUN 9 6 - 24 mg/dL   Creatinine, Ser 0.69 0.57 - 1.00 mg/dL   GFR calc non Af Amer 99 >59 mL/min/1.73   GFR calc Af Amer 114 >59 mL/min/1.73   BUN/Creatinine Ratio 13 9 - 23   Sodium 144 134 - 144 mmol/L   Potassium 4.5 3.5 - 5.2 mmol/L   Chloride 106 96 - 106 mmol/L   CO2 22 18 - 29 mmol/L   Calcium 9.4 8.7 - 10.2 mg/dL   Total Protein 6.7 6.0 - 8.5 g/dL   Albumin 4.3 3.5 - 5.5 g/dL   Globulin, Total 2.4 1.5 - 4.5 g/dL  Albumin/Globulin Ratio 1.8 1.2 - 2.2   Bilirubin Total 0.3 0.0 - 1.2 mg/dL   Alkaline Phosphatase 99 39 - 117 IU/L   AST 15 0 - 40 IU/L   ALT 5 0 - 32 IU/L  POCT SEDIMENTATION RATE  Result Value Ref Range    POCT SED RATE 31 (A) 0 - 22 mm/hr

## 2016-05-09 NOTE — Telephone Encounter (Signed)
I never got this form, I saw it once 3 mos ago, I don't know where I put it and it never surfaced again thought pt has asked [employer] to send it several times since.  Would you mind please calling [employer] and asking them to send it to [place] one more time so she can get free meals for her DM? Thanks.

## 2016-05-10 LAB — C-REACTIVE PROTEIN: CRP: 5.6 mg/L — AB (ref 0.0–4.9)

## 2016-05-10 LAB — COMPREHENSIVE METABOLIC PANEL
A/G RATIO: 1.8 (ref 1.2–2.2)
ALT: 5 IU/L (ref 0–32)
AST: 15 IU/L (ref 0–40)
Albumin: 4.3 g/dL (ref 3.5–5.5)
Alkaline Phosphatase: 99 IU/L (ref 39–117)
BUN/Creatinine Ratio: 13 (ref 9–23)
BUN: 9 mg/dL (ref 6–24)
Bilirubin Total: 0.3 mg/dL (ref 0.0–1.2)
CALCIUM: 9.4 mg/dL (ref 8.7–10.2)
CO2: 22 mmol/L (ref 18–29)
Chloride: 106 mmol/L (ref 96–106)
Creatinine, Ser: 0.69 mg/dL (ref 0.57–1.00)
GFR, EST AFRICAN AMERICAN: 114 mL/min/{1.73_m2} (ref >59)
GFR, EST NON AFRICAN AMERICAN: 99 mL/min/{1.73_m2} (ref >59)
GLOBULIN, TOTAL: 2.4 g/dL (ref 1.5–4.5)
Glucose: 89 mg/dL (ref 65–99)
POTASSIUM: 4.5 mmol/L (ref 3.5–5.2)
SODIUM: 144 mmol/L (ref 134–144)
Total Protein: 6.7 g/dL (ref 6.0–8.5)

## 2016-05-15 ENCOUNTER — Other Ambulatory Visit: Payer: Self-pay | Admitting: Endocrinology

## 2016-05-15 DIAGNOSIS — M47816 Spondylosis without myelopathy or radiculopathy, lumbar region: Secondary | ICD-10-CM | POA: Diagnosis not present

## 2016-05-15 DIAGNOSIS — M47818 Spondylosis without myelopathy or radiculopathy, sacral and sacrococcygeal region: Secondary | ICD-10-CM | POA: Diagnosis not present

## 2016-05-23 DIAGNOSIS — M47818 Spondylosis without myelopathy or radiculopathy, sacral and sacrococcygeal region: Secondary | ICD-10-CM | POA: Diagnosis not present

## 2016-05-23 DIAGNOSIS — M47816 Spondylosis without myelopathy or radiculopathy, lumbar region: Secondary | ICD-10-CM | POA: Diagnosis not present

## 2016-05-24 NOTE — Telephone Encounter (Signed)
Can someone please follow-up and have them refax a copy of this form?  Thanks.

## 2016-05-27 NOTE — Telephone Encounter (Signed)
[phone] is program number per pt. "meals division of [place]" lmtcb or fax form to [place] asap.

## 2016-05-30 ENCOUNTER — Ambulatory Visit (INDEPENDENT_AMBULATORY_CARE_PROVIDER_SITE_OTHER): Payer: Medicare HMO | Admitting: Cardiovascular Disease

## 2016-05-30 ENCOUNTER — Encounter: Payer: Self-pay | Admitting: Cardiovascular Disease

## 2016-05-30 VITALS — BP 124/72 | HR 70 | Ht 61.0 in | Wt 165.8 lb

## 2016-05-30 DIAGNOSIS — I25810 Atherosclerosis of coronary artery bypass graft(s) without angina pectoris: Secondary | ICD-10-CM | POA: Diagnosis not present

## 2016-05-30 DIAGNOSIS — I10 Essential (primary) hypertension: Secondary | ICD-10-CM

## 2016-05-30 NOTE — Progress Notes (Signed)
Electrophysiology Office Note   Date:  05/30/2016   ID:  [name], DOB Sep 14, 1960, MRN [id number]  PCP:  [name], MD  Cardiologist:  Ron Parker Primary Cardiologist :   Mertie Moores, MD    Chief Complaint  Patient presents with  . New Patient (Initial Visit)    former Dr.Katz patient     Previous notes from [place] / Ron Parker :  [name] is a 56 y.o. female who presents today for electrophysiology evaluation.   She has a history of CAD.  Had CABG 2008 with a  Nuclear stress test in 2014 showing no ischemia.  LVEF was good at that time.  She has been feeling well since her last cardiology visit. She has no chest pain, shortness of breath, PND, or orthopnea. She says that he feels like she would be able to climb a flight of stairs without issue if it were not for her back. She is able to walk on flat ground without issues as well. She says that after she eats dinner, she does with him all or to [facility] with her husband to walk around and is not limited until her legs aren't bothering her.  Today, she denies symptoms of palpitations, chest pain, shortness of breath, orthopnea, PND, lower extremity edema, claudication, dizziness, presyncope, syncope, bleeding, or neurologic sequela. The patient is tolerating medications without difficulties and is otherwise without complaint today.   05/30/2016:   [name] is seen today for an initial visit. She is a previous patient of Dr. Ron Parker and has been seen in the interim by Dr. Curt Bears.     Hx of CABG in 2008 - after a MI  Does not have any cardiac complaints.  Has had some episodes of sudden weakness.   Not similar to her previoius episode of angina / MI  May be glucose issue No recent exercise   Smokes - 6 cigarette a day ,   Vapes.    Past Medical History:  Diagnosis Date  . Allergy   . Anxiety   . Arthritis   . Back pain, chronic   . Bipolar disorder ([place])   . Bronchitis    hx-no inhalers at home  . Carotid  bruit   . Carpal tunnel syndrome on both sides   . Coronary artery disease   . DDD (degenerative disc disease), lumbar   . Depression   . Diabetes mellitus    diet controlled  . Ejection fraction    .  Marland Kitchen GERD (gastroesophageal reflux disease)   . Hx of CABG   . Hypercholesteremia   . Hypertension   . Hypothyroidism   . Multiple thyroid nodules   . Myocardial infarction 11/20/06  . Neuromuscular disorder ([place])   . Nodule of neck    Past Surgical History:  Procedure Laterality Date  . ABDOMINAL HYSTERECTOMY  1984  . BREAST SURGERY     rt br cystx2  . CORONARY ARTERY BYPASS GRAFT  2008  . KNEE SURGERY  2009   lt   . ORIF ANKLE FRACTURE Right 07/12/2012   Procedure: OPEN REDUCTION INTERNAL FIXATION (ORIF) RIGHT ANKLE FRACTURE;  Surgeon: Alta Corning, MD;  Location: [facility];  Service: Orthopedics;  Laterality: Right;  . TUBAL LIGATION    . WRIST SURGERY Left      Current Outpatient Prescriptions  Medication Sig Dispense Refill  . ACCU-CHEK AVIVA PLUS test strip Use to check blood sugar 3 times daily. 300 each 2  . AMITIZA 24  MCG capsule Take 1 capsule by mouth 2 (two) times daily.    Marland Kitchen aspirin EC 81 MG tablet Take 81 mg by mouth 2 (two) times daily.    . bromocriptine (PARLODEL) 2.5 MG tablet Take 0.625 mg by mouth daily.    Marland Kitchen buPROPion (WELLBUTRIN SR) 150 MG 12 hr tablet Take 150 mg by mouth every morning.     . carvedilol (COREG) 3.125 MG tablet TAKE 1 TABLET (3.125 MG TOTAL) BY MOUTH 2 (TWO) TIMES DAILY. 60 tablet 2  . diazepam (VALIUM) 5 MG tablet Take 1-2 tablets (5-10 mg total) by mouth every 8 (eight) hours as needed for anxiety. Make sure to separate this by 4 hours from any narcotic such as the oxycodone. 60 tablet 0  . DULoxetine (CYMBALTA) 60 MG capsule Take 60 mg by mouth 2 (two) times daily.    Marland Kitchen gabapentin (NEURONTIN) 600 MG tablet Take 600 mg by mouth 3 (three) times daily.    . Ipratropium-Albuterol (COMBIVENT RESPIMAT) 20-100 MCG/ACT AERS  respimat Inhale 1 puff into the lungs every 6 (six) hours. (Patient taking differently: Inhale 1 puff into the lungs as needed. ) 4 g 1  . levothyroxine (SYNTHROID, LEVOTHROID) 75 MCG tablet TAKE 1 TABLET EVERY DAY BEFORE BREAKFAST 90 tablet 2  . metFORMIN (GLUCOPHAGE-XR) 500 MG 24 hr tablet TAKE 2 TABLETS  DAILY WITH BREAKFAST. 180 tablet 3  . methocarbamol (ROBAXIN) 500 MG tablet Take 500 mg by mouth every 6 (six) hours as needed for muscle spasms.    Marland Kitchen omeprazole (PRILOSEC) 40 MG capsule Take 1 capsule (40 mg total) by mouth daily. 30 minutes before a meal 90 capsule 3  . oxyCODONE-acetaminophen (PERCOCET) 10-325 MG tablet Take 1-2 tablets by mouth every 4 (four) hours as needed for pain. 60 tablet 0  . QUEtiapine (SEROQUEL) 200 MG tablet Take 200 mg by mouth 3 (three) times daily.    . ranitidine (ZANTAC) 150 MG tablet Take 1 tablet (150 mg total) by mouth 2 (two) times daily. 180 tablet 3  . rosuvastatin (CRESTOR) 20 MG tablet Take 1 tablet (20 mg total) by mouth daily. 90 tablet 3  . sitaGLIPtin (JANUVIA) 100 MG tablet Take 1 tablet (100 mg total) by mouth daily. 90 tablet 3  . traZODone (DESYREL) 50 MG tablet Take 50 mg by mouth at bedtime as needed for sleep.      No current facility-administered medications for this visit.     Allergies:   Coconut flavor; Ibuprofen; Prednisone; Cyclobenzaprine; and Morphine   Social History:  The patient  reports that she has been smoking Cigarettes.  She has a 7.25 pack-year smoking history. She has never used smokeless tobacco. She reports that she does not drink alcohol or use drugs.   Family History:  The patient's family history includes Cancer in her daughter, maternal aunt, maternal grandfather, and paternal aunt; Heart disease in her father and mother; Hyperlipidemia in her father and mother; Hypertension in her brother, father, maternal grandfather, maternal grandmother, mother, paternal grandfather, paternal grandmother, and sister; Stroke in  her father.    ROS:  Please see the history of present illness.   Otherwise, review of systems is positive for appetite change, constipation, depression, anxiety, back pain, balance problems.   All other systems are reviewed and negative.    PHYSICAL EXAM: VS:  BP 124/72   Pulse 70   Ht 5\' 1"  (1.549 m)   Wt 165 lb 12.8 oz (75.2 kg)   BMI 31.33 kg/m  , BMI  Body mass index is 31.33 kg/m. GEN: Well nourished, well developed, in no acute distress  HEENT: normal  Neck: no JVD, carotid bruits, or masses Cardiac: RRR; no murmurs, rubs, or gallops,no edema  Respiratory:  clear to auscultation bilaterally, normal work of breathing GI: soft, nontender, nondistended, + BS MS: no deformity or atrophy  Skin: warm and dry Neuro:  Strength and sensation are intact Psych: euthymic mood, full affect  EKG:  EKG is ordered today. The ekg ordered today shows sinus rhythm, rate 78, inferior Q waves and a a he is a  Recent Labs: 10/22/2015: Hemoglobin 12.7; Platelets 155 02/01/2016: TSH 2.02 05/09/2016: ALT 5; BUN 9; Creatinine, Ser 0.69; Potassium 4.5; Sodium 144    Lipid Panel     Component Value Date/Time   CHOL 129 03/13/2014 1526   TRIG 171 (H) 03/13/2014 1526   HDL 34 (L) 03/13/2014 1526   CHOLHDL 3.8 03/13/2014 1526   VLDL 34 03/13/2014 1526   LDLCALC 61 03/13/2014 1526     Wt Readings from Last 3 Encounters:  05/30/16 165 lb 12.8 oz (75.2 kg)  05/09/16 167 lb (75.8 kg)  02/02/16 161 lb 3.2 oz (73.1 kg)      Other studies Reviewed: Additional studies/ records that were reviewed today include: Epic notes  ASSESSMENT AND PLAN:  1.  CAD: Currently no symptoms of coronary disease.  Has not been exercising   2. Hyperlipidemia: On Crestor.  Check lipids today    I will see her in  6 months .    Current medicines are reviewed at length with the patient today.   The patient does not have concerns regarding her medicines.  The following changes were made today:  none  Labs/  tests ordered today include:  No orders of the defined types were placed in this encounter.     Mertie Moores, MD  05/30/2016 10:18 AM    [facility] [address],  [place], [place]  [address] Pager [phone] Phone: [phone]; Fax: [phone]

## 2016-05-30 NOTE — Patient Instructions (Signed)

## 2016-05-31 LAB — LIPID PANEL
CHOL/HDL RATIO: 3.1 ratio (ref 0.0–4.4)
CHOLESTEROL TOTAL: 143 mg/dL (ref 100–199)
HDL: 46 mg/dL (ref >39)
LDL Calculated: 59 mg/dL (ref 0–99)
TRIGLYCERIDES: 191 mg/dL — AB (ref 0–149)
VLDL Cholesterol Cal: 38 mg/dL (ref 5–40)

## 2016-06-02 ENCOUNTER — Ambulatory Visit (INDEPENDENT_AMBULATORY_CARE_PROVIDER_SITE_OTHER): Payer: Medicare HMO | Admitting: Endocrinology

## 2016-06-02 ENCOUNTER — Encounter: Payer: Self-pay | Admitting: Endocrinology

## 2016-06-02 VITALS — BP 128/70 | HR 73 | Ht 61.0 in | Wt 165.0 lb

## 2016-06-02 DIAGNOSIS — E119 Type 2 diabetes mellitus without complications: Secondary | ICD-10-CM

## 2016-06-02 DIAGNOSIS — E890 Postprocedural hypothyroidism: Secondary | ICD-10-CM

## 2016-06-02 LAB — TSH: TSH: 4.79 u[IU]/mL — AB (ref 0.35–4.50)

## 2016-06-02 LAB — POCT GLYCOSYLATED HEMOGLOBIN (HGB A1C): Hemoglobin A1C: 6.7

## 2016-06-02 MED ORDER — BROMOCRIPTINE MESYLATE 2.5 MG PO TABS
0.6250 mg | ORAL_TABLET | Freq: Every day | ORAL | 3 refills | Status: DC
Start: 2016-06-02 — End: 2016-06-09

## 2016-06-02 MED ORDER — LEVOTHYROXINE SODIUM 100 MCG PO TABS: 100.0000 ug | ORAL_TABLET | Freq: Every day | ORAL | 11 refills | Status: DC

## 2016-06-02 MED ORDER — EMPAGLIFLOZIN 10 MG PO TABS: 10.0000 mg | ORAL_TABLET | Freq: Every day | ORAL | 11 refills | Status: DC

## 2016-06-02 NOTE — Progress Notes (Signed)
Subjective:    Patient ID: [name], female    DOB: Jan 29, 1961, 56 y.o.   MRN: [id number]  HPI Pt has multinodular goiter (dx'ed 2014; it was found on [place], which had been done to investigate lymphadenopathy of the neck; the nodes appeared benign, but goiter was incidentally noted; bx was c/w benign follicular nodule; she was noted in early 2015 to have suppressed TSH, and she received RAI in April of 2015; in late 2015, she was noted to have elevated TSH, and was rx'ed synthroid).   She does not notice any nodule in the neck.   Pt returns for f/u of diabetes mellitus:  DM type: 2 Dx'ed: [id number] Complications: polyneuropathy, PAD, and CAD.   Therapy: 3 oral meds GDM: never DKA: never Severe hypoglycemia: never Pancreatitis: never Other: She has never been on insulin; she did not tolerate invokana (nausea) Interval history: pt states she feels well in general, except for low-back pain.  She has recently received steroid injections.  no cbg record, but states cbg's are often over 200.  Past Medical History:  Diagnosis Date  . Allergy   . Anxiety   . Arthritis   . Back pain, chronic   . Bipolar disorder ([place])   . Bronchitis    hx-no inhalers at home  . Carotid bruit   . Carpal tunnel syndrome on both sides   . Coronary artery disease   . DDD (degenerative disc disease), lumbar   . Depression   . Diabetes mellitus    diet controlled  . Ejection fraction    .  Marland Kitchen GERD (gastroesophageal reflux disease)   . Hx of CABG   . Hypercholesteremia   . Hypertension   . Hypothyroidism   . Multiple thyroid nodules   . Myocardial infarction 11/20/06  . Neuromuscular disorder ([place])   . Nodule of neck     Past Surgical History:  Procedure Laterality Date  . ABDOMINAL HYSTERECTOMY  1984  . BREAST SURGERY     rt br cystx2  . CORONARY ARTERY BYPASS GRAFT  2008  . KNEE SURGERY  2009   lt   . ORIF ANKLE FRACTURE Right 07/12/2012   Procedure: OPEN REDUCTION INTERNAL FIXATION  (ORIF) RIGHT ANKLE FRACTURE;  Surgeon: Alta Corning, MD;  Location: [facility];  Service: Orthopedics;  Laterality: Right;  . TUBAL LIGATION    . WRIST SURGERY Left     Social History   Social History  . Marital status: Married    Spouse name: N/A  . Number of children: N/A  . Years of education: N/A   Occupational History  . Not on file.   Social History Main Topics  . Smoking status: Current Every Day Smoker    Packs/day: 0.25    Years: 29.00    Types: Cigarettes  . Smokeless tobacco: Never Used  . Alcohol use No  . Drug use: No  . Sexual activity: Not on file   Other Topics Concern  . Not on file   Social History Narrative  . No narrative on file    Current Outpatient Prescriptions on File Prior to Visit  Medication Sig Dispense Refill  . ACCU-CHEK AVIVA PLUS test strip Use to check blood sugar 3 times daily. 300 each 2  . AMITIZA 24 MCG capsule Take 1 capsule by mouth 2 (two) times daily.    Marland Kitchen aspirin EC 81 MG tablet Take 81 mg by mouth 2 (two) times daily.    Marland Kitchen buPROPion (  WELLBUTRIN SR) 150 MG 12 hr tablet Take 150 mg by mouth every morning.     . carvedilol (COREG) 3.125 MG tablet TAKE 1 TABLET (3.125 MG TOTAL) BY MOUTH 2 (TWO) TIMES DAILY. 60 tablet 2  . diazepam (VALIUM) 5 MG tablet Take 1-2 tablets (5-10 mg total) by mouth every 8 (eight) hours as needed for anxiety. Make sure to separate this by 4 hours from any narcotic such as the oxycodone. 60 tablet 0  . DULoxetine (CYMBALTA) 60 MG capsule Take 60 mg by mouth 2 (two) times daily.    Marland Kitchen gabapentin (NEURONTIN) 600 MG tablet Take 600 mg by mouth 3 (three) times daily.    . Ipratropium-Albuterol (COMBIVENT RESPIMAT) 20-100 MCG/ACT AERS respimat Inhale 1 puff into the lungs every 6 (six) hours. (Patient taking differently: Inhale 1 puff into the lungs as needed. ) 4 g 1  . metFORMIN (GLUCOPHAGE-XR) 500 MG 24 hr tablet TAKE 2 TABLETS  DAILY WITH BREAKFAST. 180 tablet 3  . methocarbamol (ROBAXIN) 500  MG tablet Take 500 mg by mouth every 6 (six) hours as needed for muscle spasms.    Marland Kitchen omeprazole (PRILOSEC) 40 MG capsule Take 1 capsule (40 mg total) by mouth daily. 30 minutes before a meal 90 capsule 3  . QUEtiapine (SEROQUEL) 200 MG tablet Take 200 mg by mouth 3 (three) times daily.    . ranitidine (ZANTAC) 150 MG tablet Take 1 tablet (150 mg total) by mouth 2 (two) times daily. 180 tablet 3  . rosuvastatin (CRESTOR) 20 MG tablet Take 1 tablet (20 mg total) by mouth daily. 90 tablet 3  . sitaGLIPtin (JANUVIA) 100 MG tablet Take 1 tablet (100 mg total) by mouth daily. 90 tablet 3  . traZODone (DESYREL) 50 MG tablet Take 50 mg by mouth at bedtime as needed for sleep.      No current facility-administered medications on file prior to visit.     Allergies  Allergen Reactions  . Coconut Flavor Swelling and Rash  . Ibuprofen Swelling and Rash  . Prednisone Swelling and Rash  . Cyclobenzaprine   . Morphine Nausea And Vomiting    Family History  Problem Relation Age of Onset  . Heart disease Mother   . Hyperlipidemia Mother   . Hypertension Mother   . Heart disease Father   . Stroke Father   . Hypertension Father   . Hyperlipidemia Father   . Hypertension Sister   . Hypertension Brother   . Cancer Daughter     unknown  . Hypertension Maternal Grandmother   . Hypertension Maternal Grandfather   . Cancer Maternal Grandfather   . Hypertension Paternal Grandmother   . Hypertension Paternal Grandfather   . Cancer Maternal Aunt     breast and ovarian  . Cancer Paternal Aunt     breast  . Colon cancer Neg Hx     BP 128/70   Pulse 73   Ht 5\' 1"  (1.549 m)   Wt 165 lb (74.8 kg)   SpO2 96%   BMI 31.18 kg/m    Review of Systems She has fatigue.      Objective:   Physical Exam VITAL SIGNS:  See vs page GENERAL: no distress NECK: thyroid is again noted to be slightly enlarged, with multinodular surface.  Pulses: dorsalis pedis intact bilat.   MSK: no deformity of the  feet CV: no leg edema.  Skin:  no ulcer on the feet.  normal temp on the feet.  Large port-wine area on  the right leg and foot. Neuro: sensation is intact to touch on the feet, but decreased from normal.   A1c=6.7% Lab Results  Component Value Date   TSH 4.79 (H) 06/02/2016      Assessment & Plan:  Type 2 DM, with PAD: worse Low back pain: steroid injections are probably worsening glycemic control Post-RAI hypothyroidism: she needs increased rx.  I have sent a prescription to your pharmacy.  Patient is advised the following: Patient Instructions  check your blood sugar once a day.  vary the time of day when you check, between before the 3 meals, and at bedtime.  also check if you have symptoms of your blood sugar being too high or too low.  please keep a record of the readings and bring it to your next appointment here (or you can bring the meter itself).  You can write it on any piece of paper.  please call us sooner if your blood sugar goes below 70, or if you have a lot of readings over 200. I have sent a prescription to your pharmacy, to add "jardiance."   Our a1c goal will be 6.0%.   A thyroid blood test is requested for you today.  We'll let you know about the results.   You don't need the thyroid ultrasound now.  Please come back for a follow-up appointment in 4 months.

## 2016-06-02 NOTE — Patient Instructions (Addendum)
check your blood sugar once a day.  vary the time of day when you check, between before the 3 meals, and at bedtime.  also check if you have symptoms of your blood sugar being too high or too low.  please keep a record of the readings and bring it to your next appointment here (or you can bring the meter itself).  You can write it on any piece of paper.  please call us sooner if your blood sugar goes below 70, or if you have a lot of readings over 200. I have sent a prescription to your pharmacy, to add "jardiance."   Our a1c goal will be 6.0%.   A thyroid blood test is requested for you today.  We'll let you know about the results.   You don't need the thyroid ultrasound now.  Please come back for a follow-up appointment in 4 months.

## 2016-06-09 ENCOUNTER — Telehealth: Payer: Self-pay | Admitting: Endocrinology

## 2016-06-09 MED ORDER — BROMOCRIPTINE MESYLATE 2.5 MG PO TABS: 0.6250 mg | ORAL_TABLET | Freq: Every day | ORAL | 1 refills | Status: DC

## 2016-06-09 NOTE — Telephone Encounter (Signed)
I contacted the patient. She stated the pharmacy needed to confirm the dosage for the bromocriptine. Rx resubmitted, for 1/2 tab daily.

## 2016-06-09 NOTE — Telephone Encounter (Signed)
Patient insurance has sent fax for Dr Loanne Drilling to Verify patient prescriptions, patient didn't know the name of her prescription. She has been out for two weeks  Please advise

## 2016-06-15 NOTE — Telephone Encounter (Signed)
Patient is calling on the status of her medication 4 different medications, asking which ones do she need to take  empagliflozin (JARDIANCE) 10 MG TABS tablet metFORMIN (GLUCOPHAGE-XR) 500 MG 24 hr tablet invokanna

## 2016-06-16 ENCOUNTER — Encounter: Payer: Self-pay | Admitting: Endocrinology

## 2016-06-16 NOTE — Telephone Encounter (Signed)
Requested a call back from the patient to further discuss.  

## 2016-06-16 NOTE — Telephone Encounter (Signed)
Patient is calling asking what medication should she take? She said she sent you a message on my chart.

## 2016-06-20 ENCOUNTER — Encounter: Payer: Self-pay | Admitting: Family Medicine

## 2016-06-21 DIAGNOSIS — F17200 Nicotine dependence, unspecified, uncomplicated: Secondary | ICD-10-CM | POA: Diagnosis not present

## 2016-06-21 DIAGNOSIS — M5136 Other intervertebral disc degeneration, lumbar region: Secondary | ICD-10-CM | POA: Diagnosis not present

## 2016-06-21 DIAGNOSIS — G8929 Other chronic pain: Secondary | ICD-10-CM | POA: Diagnosis not present

## 2016-06-21 DIAGNOSIS — Z79891 Long term (current) use of opiate analgesic: Secondary | ICD-10-CM | POA: Diagnosis not present

## 2016-06-21 DIAGNOSIS — M47816 Spondylosis without myelopathy or radiculopathy, lumbar region: Secondary | ICD-10-CM | POA: Diagnosis not present

## 2016-06-23 DIAGNOSIS — M791 Myalgia: Secondary | ICD-10-CM | POA: Diagnosis not present

## 2016-06-23 DIAGNOSIS — M47818 Spondylosis without myelopathy or radiculopathy, sacral and sacrococcygeal region: Secondary | ICD-10-CM | POA: Diagnosis not present

## 2016-06-26 ENCOUNTER — Other Ambulatory Visit: Payer: Self-pay | Admitting: Family Medicine

## 2016-06-26 DIAGNOSIS — Z1231 Encounter for screening mammogram for malignant neoplasm of breast: Secondary | ICD-10-CM

## 2016-07-04 ENCOUNTER — Ambulatory Visit
Admission: RE | Admit: 2016-07-04 | Discharge: 2016-07-04 | Disposition: A | Discharge status: Home / Self Care | Payer: Medicare HMO | Source: Ambulatory Visit | Attending: Family Medicine | Admitting: Family Medicine

## 2016-07-04 DIAGNOSIS — Z1231 Encounter for screening mammogram for malignant neoplasm of breast: Secondary | ICD-10-CM

## 2016-07-18 DIAGNOSIS — G8929 Other chronic pain: Secondary | ICD-10-CM | POA: Diagnosis not present

## 2016-07-18 DIAGNOSIS — F17200 Nicotine dependence, unspecified, uncomplicated: Secondary | ICD-10-CM | POA: Diagnosis not present

## 2016-07-18 DIAGNOSIS — M47816 Spondylosis without myelopathy or radiculopathy, lumbar region: Secondary | ICD-10-CM | POA: Diagnosis not present

## 2016-07-18 DIAGNOSIS — M5136 Other intervertebral disc degeneration, lumbar region: Secondary | ICD-10-CM | POA: Diagnosis not present

## 2016-07-18 DIAGNOSIS — Z79899 Other long term (current) drug therapy: Secondary | ICD-10-CM | POA: Diagnosis not present

## 2016-07-18 DIAGNOSIS — Z79891 Long term (current) use of opiate analgesic: Secondary | ICD-10-CM | POA: Diagnosis not present

## 2016-07-20 DIAGNOSIS — M533 Sacrococcygeal disorders, not elsewhere classified: Secondary | ICD-10-CM | POA: Diagnosis not present

## 2016-08-03 ENCOUNTER — Telehealth: Payer: Self-pay | Admitting: Family Medicine

## 2016-08-03 DIAGNOSIS — G629 Polyneuropathy, unspecified: Secondary | ICD-10-CM

## 2016-08-03 DIAGNOSIS — M5440 Lumbago with sciatica, unspecified side: Secondary | ICD-10-CM

## 2016-08-03 DIAGNOSIS — M5136 Other intervertebral disc degeneration, lumbar region: Secondary | ICD-10-CM

## 2016-08-03 DIAGNOSIS — G8929 Other chronic pain: Secondary | ICD-10-CM

## 2016-08-03 DIAGNOSIS — M48062 Spinal stenosis, lumbar region with neurogenic claudication: Secondary | ICD-10-CM

## 2016-08-03 NOTE — Telephone Encounter (Signed)
Referral placed.

## 2016-08-03 NOTE — Telephone Encounter (Signed)
Pt calling wanting Dr Brigitte Pulse to make a referral to [facility] pain clinic for pt to go

## 2016-08-04 ENCOUNTER — Encounter: Payer: Self-pay | Admitting: Family Medicine

## 2016-08-07 ENCOUNTER — Encounter: Payer: Self-pay | Admitting: Family Medicine

## 2016-08-09 ENCOUNTER — Encounter: Payer: Self-pay | Admitting: Family Medicine

## 2016-08-09 NOTE — Telephone Encounter (Signed)
Referral was placed 4/5 - would you please call and give pt an update on where things are in the referral process? She has sent 3 emails about this now.  I assume she is wanting the referral to go to the [facility] in [place] though not confirmed (I don't know if there are any other pain clinics in [place]). She lists what I assume is the clinics phone #: [phone].  Thanks, Harmon Pier

## 2016-08-09 NOTE — Telephone Encounter (Signed)
This was done on 4/5.

## 2016-08-14 ENCOUNTER — Telehealth: Payer: Self-pay | Admitting: Endocrinology

## 2016-08-14 NOTE — Telephone Encounter (Signed)
Pt called in and said that [place] sent over a request for her to have a new meter, and was told the Dr. Loanne Drilling denied the script.  She would like to call back as to why. [phone]

## 2016-08-15 MED ORDER — ACCU-CHEK FASTCLIX LANCETS MISC: 1 refills | Status: DC

## 2016-08-15 MED ORDER — GLUCOSE BLOOD VI STRP: ORAL_STRIP | 1 refills | Status: DC

## 2016-08-15 MED ORDER — ACCU-CHEK GUIDE W/DEVICE KIT: 1.0000 | PACK | Freq: Three times a day (TID) | 1 refills | Status: DC

## 2016-08-15 NOTE — Telephone Encounter (Signed)
I contacted the patient and advised I could not locate the denial for the meter. Patient was advised we could send a new meter to [facility]. Rx for Acc-check guide meter, strips and lancets submitted to [facility].

## 2016-08-29 ENCOUNTER — Encounter: Payer: Self-pay | Admitting: Family Medicine

## 2016-08-29 DIAGNOSIS — Z79891 Long term (current) use of opiate analgesic: Secondary | ICD-10-CM | POA: Diagnosis not present

## 2016-08-29 DIAGNOSIS — G8929 Other chronic pain: Secondary | ICD-10-CM | POA: Diagnosis not present

## 2016-08-29 DIAGNOSIS — M47816 Spondylosis without myelopathy or radiculopathy, lumbar region: Secondary | ICD-10-CM | POA: Diagnosis not present

## 2016-08-29 DIAGNOSIS — M5416 Radiculopathy, lumbar region: Secondary | ICD-10-CM | POA: Diagnosis not present

## 2016-08-29 NOTE — Telephone Encounter (Signed)
I have spoken with the pain clinic. Original referral faxed 4/11. Faxing again today. Also spoke with [facility] to ensure that Josem Kaufmann is not needed. Rep stated that it was not, reference number [id number]. Faxed this reference number to clinic and left VM with Rosemarie Ax, the referrals coordinator.  From my understanding, pt was seen today at [facility].

## 2016-09-05 DIAGNOSIS — M25511 Pain in right shoulder: Secondary | ICD-10-CM | POA: Diagnosis not present

## 2016-09-05 DIAGNOSIS — M67911 Unspecified disorder of synovium and tendon, right shoulder: Secondary | ICD-10-CM | POA: Diagnosis not present

## 2016-09-06 DIAGNOSIS — H5203 Hypermetropia, bilateral: Secondary | ICD-10-CM | POA: Diagnosis not present

## 2016-09-06 DIAGNOSIS — H524 Presbyopia: Secondary | ICD-10-CM | POA: Diagnosis not present

## 2016-09-06 DIAGNOSIS — H52209 Unspecified astigmatism, unspecified eye: Secondary | ICD-10-CM | POA: Diagnosis not present

## 2016-09-06 LAB — HM DIABETES EYE EXAM

## 2016-09-11 ENCOUNTER — Encounter: Payer: Self-pay | Admitting: Family Medicine

## 2016-09-11 ENCOUNTER — Telehealth: Payer: Self-pay | Admitting: Family Medicine

## 2016-09-11 DIAGNOSIS — F331 Major depressive disorder, recurrent, moderate: Secondary | ICD-10-CM

## 2016-09-11 DIAGNOSIS — F319 Bipolar disorder, unspecified: Secondary | ICD-10-CM | POA: Diagnosis not present

## 2016-09-11 NOTE — Telephone Encounter (Signed)
Pt calling requesting referral to [facility] on [address] in [place]. Pt callback number is [phone]. Thanks!   [facility] Fax: [phone]

## 2016-09-11 NOTE — Telephone Encounter (Signed)
Referral placed. Please encourage patient to contact [facility] directly to schedule her appointment rather than waiting for them to call her as might take a while.

## 2016-09-23 DIAGNOSIS — M25511 Pain in right shoulder: Secondary | ICD-10-CM | POA: Diagnosis not present

## 2016-09-27 DIAGNOSIS — M47816 Spondylosis without myelopathy or radiculopathy, lumbar region: Secondary | ICD-10-CM | POA: Diagnosis not present

## 2016-09-27 DIAGNOSIS — G8929 Other chronic pain: Secondary | ICD-10-CM | POA: Diagnosis not present

## 2016-09-27 DIAGNOSIS — Z79891 Long term (current) use of opiate analgesic: Secondary | ICD-10-CM | POA: Diagnosis not present

## 2016-09-29 ENCOUNTER — Other Ambulatory Visit (INDEPENDENT_AMBULATORY_CARE_PROVIDER_SITE_OTHER): Payer: Medicare HMO

## 2016-09-29 ENCOUNTER — Ambulatory Visit (INDEPENDENT_AMBULATORY_CARE_PROVIDER_SITE_OTHER): Payer: Medicare HMO | Admitting: Endocrinology

## 2016-09-29 ENCOUNTER — Encounter: Payer: Self-pay | Admitting: Endocrinology

## 2016-09-29 VITALS — BP 98/62 | HR 67 | Ht 61.0 in | Wt 156.0 lb

## 2016-09-29 DIAGNOSIS — E890 Postprocedural hypothyroidism: Secondary | ICD-10-CM

## 2016-09-29 DIAGNOSIS — E119 Type 2 diabetes mellitus without complications: Secondary | ICD-10-CM

## 2016-09-29 LAB — BASIC METABOLIC PANEL
BUN: 13 mg/dL (ref 6–23)
CHLORIDE: 105 meq/L (ref 96–112)
CO2: 30 mEq/L (ref 19–32)
CREATININE: 0.86 mg/dL (ref 0.40–1.20)
Calcium: 9.7 mg/dL (ref 8.4–10.5)
GFR: 72.76 mL/min (ref >60.00)
Glucose, Bld: 142 mg/dL — ABNORMAL HIGH (ref 70–99)
POTASSIUM: 4.2 meq/L (ref 3.5–5.1)
Sodium: 140 mEq/L (ref 135–145)

## 2016-09-29 LAB — TSH: TSH: 1.67 u[IU]/mL (ref 0.35–4.50)

## 2016-09-29 LAB — POCT GLYCOSYLATED HEMOGLOBIN (HGB A1C): HEMOGLOBIN A1C: 6.4

## 2016-09-29 NOTE — Patient Instructions (Addendum)
check your blood sugar once a day.  vary the time of day when you check, between before the 3 meals, and at bedtime.  also check if you have symptoms of your blood sugar being too high or too low.  please keep a record of the readings and bring it to your next appointment here (or you can bring the meter itself).  You can write it on any piece of paper.  please call us sooner if your blood sugar goes below 70, or if you have a lot of readings over 200. Please continue the same medications. With diet, you can get the a1c lower still.   blood tests are requested for you today.  We'll let you know about the results.   You don't need the follow-up thyroid ultrasound now.  Please come back for a follow-up appointment in 4 months.

## 2016-09-29 NOTE — Progress Notes (Signed)
Subjective:    Patient ID: [name], female    DOB: 07-14-1960, 56 y.o.   MRN: [id number]  HPI Pt has multinodular goiter (dx'ed 2014; it was found on [place], which had been done to investigate lymphadenopathy of the neck; the nodes appeared benign, but goiter was incidentally noted; bx was c/w benign follicular nodule; she was noted in early 2015 to have suppressed TSH, and she received RAI in April of 2015; in late 2015, she was noted to have elevated TSH, and was rx'ed synthroid).   She does not notice any nodule in the neck.   Pt also returns for f/u of diabetes mellitus:  DM type: 2 Dx'ed: 5916 Complications: polyneuropathy, PAD, and CAD.   Therapy: 4 oral meds GDM: never DKA: never Severe hypoglycemia: never Pancreatitis: never Other: She has never been on insulin; she did not tolerate invokana (nausea).   Interval history: pt states she feels well in general, except for insomnia.  last steroid injection was 3 weeks ago.  no cbg record, but states cbg's are well-controlled.   Past Medical History:  Diagnosis Date  . Allergy   . Anxiety   . Arthritis   . Back pain, chronic   . Bipolar disorder ([place])   . Bronchitis    hx-no inhalers at home  . Carotid bruit   . Carpal tunnel syndrome on both sides   . Coronary artery disease   . DDD (degenerative disc disease), lumbar   . Depression   . Diabetes mellitus    diet controlled  . Ejection fraction    .  Marland Kitchen GERD (gastroesophageal reflux disease)   . Hx of CABG   . Hypercholesteremia   . Hypertension   . Hypothyroidism   . Multiple thyroid nodules   . Myocardial infarction ([place]) 11/20/06  . Neuromuscular disorder ([place])   . Nodule of neck     Past Surgical History:  Procedure Laterality Date  . ABDOMINAL HYSTERECTOMY  1984  . BREAST EXCISIONAL BIOPSY Right   . BREAST SURGERY     rt br cystx2  . CORONARY ARTERY BYPASS GRAFT  2008  . KNEE SURGERY  2009   lt   . ORIF ANKLE FRACTURE Right 07/12/2012   Procedure: OPEN REDUCTION INTERNAL FIXATION (ORIF) RIGHT ANKLE FRACTURE;  Surgeon: Alta Corning, MD;  Location: [facility];  Service: Orthopedics;  Laterality: Right;  . TUBAL LIGATION    . WRIST SURGERY Left     Social History   Social History  . Marital status: Married    Spouse name: N/A  . Number of children: N/A  . Years of education: N/A   Occupational History  . Not on file.   Social History Main Topics  . Smoking status: Current Every Day Smoker    Packs/day: 0.25    Years: 29.00    Types: Cigarettes  . Smokeless tobacco: Never Used  . Alcohol use No  . Drug use: No  . Sexual activity: Not on file   Other Topics Concern  . Not on file   Social History Narrative  . No narrative on file    Current Outpatient Prescriptions on File Prior to Visit  Medication Sig Dispense Refill  . ACCU-CHEK FASTCLIX LANCETS MISC Use to check blood sugar 3 times daily. 306 each 1  . AMITIZA 24 MCG capsule Take 1 capsule by mouth 2 (two) times daily.    Marland Kitchen aspirin EC 81 MG tablet Take 81 mg by mouth 2 (  two) times daily.    . Blood Glucose Monitoring Suppl (ACCU-CHEK GUIDE) w/Device KIT 1 each by Does not apply route 3 (three) times daily. 1 kit 1  . bromocriptine (PARLODEL) 2.5 MG tablet Take 0.5 tablets (1.25 mg total) by mouth daily. 45 tablet 1  . carvedilol (COREG) 3.125 MG tablet TAKE 1 TABLET (3.125 MG TOTAL) BY MOUTH 2 (TWO) TIMES DAILY. 60 tablet 2  . diazepam (VALIUM) 5 MG tablet Take 1-2 tablets (5-10 mg total) by mouth every 8 (eight) hours as needed for anxiety. Make sure to separate this by 4 hours from any narcotic such as the oxycodone. 60 tablet 0  . DULoxetine (CYMBALTA) 60 MG capsule Take 60 mg by mouth 2 (two) times daily.    . empagliflozin (JARDIANCE) 10 MG TABS tablet Take 10 mg by mouth daily. 30 tablet 11  . gabapentin (NEURONTIN) 600 MG tablet Take 600 mg by mouth 3 (three) times daily.    Marland Kitchen glucose blood (ACCU-CHEK GUIDE) test strip Use to check  blood sugar 3 times daily. 300 each 1  . Ipratropium-Albuterol (COMBIVENT RESPIMAT) 20-100 MCG/ACT AERS respimat Inhale 1 puff into the lungs every 6 (six) hours. (Patient taking differently: Inhale 1 puff into the lungs as needed. ) 4 g 1  . levothyroxine (SYNTHROID, LEVOTHROID) 100 MCG tablet Take 1 tablet (100 mcg total) by mouth daily before breakfast. 30 tablet 11  . metFORMIN (GLUCOPHAGE-XR) 500 MG 24 hr tablet TAKE 2 TABLETS  DAILY WITH BREAKFAST. 180 tablet 3  . methocarbamol (ROBAXIN) 500 MG tablet Take 500 mg by mouth every 6 (six) hours as needed for muscle spasms.    Marland Kitchen omeprazole (PRILOSEC) 40 MG capsule Take 1 capsule (40 mg total) by mouth daily. 30 minutes before a meal 90 capsule 3  . QUEtiapine (SEROQUEL) 200 MG tablet Take 200 mg by mouth 3 (three) times daily.    . ranitidine (ZANTAC) 150 MG tablet Take 1 tablet (150 mg total) by mouth 2 (two) times daily. 180 tablet 3  . rosuvastatin (CRESTOR) 20 MG tablet Take 1 tablet (20 mg total) by mouth daily. 90 tablet 3  . sitaGLIPtin (JANUVIA) 100 MG tablet Take 1 tablet (100 mg total) by mouth daily. 90 tablet 3  . traZODone (DESYREL) 50 MG tablet Take 50 mg by mouth at bedtime as needed for sleep.     Marland Kitchen buPROPion (WELLBUTRIN SR) 150 MG 12 hr tablet Take 150 mg by mouth every morning.      No current facility-administered medications on file prior to visit.     Allergies  Allergen Reactions  . Coconut Flavor Swelling and Rash  . Ibuprofen Swelling and Rash  . Prednisone Swelling and Rash  . Cyclobenzaprine   . Morphine Nausea And Vomiting    Family History  Problem Relation Age of Onset  . Heart disease Mother   . Hyperlipidemia Mother   . Hypertension Mother   . Heart disease Father   . Stroke Father   . Hypertension Father   . Hyperlipidemia Father   . Hypertension Sister   . Hypertension Brother   . Cancer Daughter        unknown  . Hypertension Maternal Grandmother   . Hypertension Maternal Grandfather   .  Cancer Maternal Grandfather   . Hypertension Paternal Grandmother   . Hypertension Paternal Grandfather   . Cancer Maternal Aunt        breast and ovarian  . Cancer Paternal Aunt  breast  . Colon cancer Neg Hx     BP 98/62   Pulse 67   Ht 5' 1"  (1.549 m)   Wt 156 lb (70.8 kg)   SpO2 92%   BMI 29.48 kg/m    Review of Systems She denies LOC.  She has lost a few lbs.    Objective:   Physical Exam VITAL SIGNS:  See vs page GENERAL: no distress NECK: thyroid is again noted to be slightly enlarged, with multinodular surface.  Pulses: dorsalis pedis intact bilat.   MSK: no deformity of the feet CV: no leg edema.  Skin:  no ulcer on the feet.  normal temp on the feet.  Large port-wine area on the right leg and foot. Neuro: sensation is intact to touch on the feet, but decreased from normal.    Lab Results  Component Value Date   HGBA1C 6.4 09/29/2016   Lab Results  Component Value Date   TSH 1.67 09/29/2016      Assessment & Plan:  Type 2 DM: well-controlled Hypothyroidism: well-replaced Multinodular goiter: clinically unchanged.  Patient Instructions  check your blood sugar once a day.  vary the time of day when you check, between before the 3 meals, and at bedtime.  also check if you have symptoms of your blood sugar being too high or too low.  please keep a record of the readings and bring it to your next appointment here (or you can bring the meter itself).  You can write it on any piece of paper.  please call us sooner if your blood sugar goes below 70, or if you have a lot of readings over 200. Please continue the same medications. With diet, you can get the a1c lower still.   blood tests are requested for you today.  We'll let you know about the results.   You don't need the follow-up thyroid ultrasound now.  Please come back for a follow-up appointment in 4 months.

## 2016-10-03 DIAGNOSIS — M24811 Other specific joint derangements of right shoulder, not elsewhere classified: Secondary | ICD-10-CM | POA: Diagnosis not present

## 2016-10-03 DIAGNOSIS — M75111 Incomplete rotator cuff tear or rupture of right shoulder, not specified as traumatic: Secondary | ICD-10-CM | POA: Diagnosis not present

## 2016-10-04 NOTE — Progress Notes (Addendum)
Subjective:    Patient ID: [name], female    DOB: [birth year], 56 y.o.   MRN: [id number] Chief Complaint  Patient presents with  . Medication Management    recheck and blood work  . Follow-up    HPI  [name] is a delightful 56 yo woman here for a 55 month recheck of her chronic medical conditions with medication refills and blood work.  She is a reasonably healthy woman despite her extensive PMHx and numerous specialists she needs to rely on a regular basis.  DMII: diagnosed 2016 follows closely with Dr. Loanne Drilling who she last saw last wk. Well-controlled.  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 Loanne Drilling who she last saw last wk. Started on levothyroxine late 2015. CAD: follows regularly with cardiology Dr. Acie Fredrickson q6 mos - last visit 4 mos prior 05/30/16. Lipid panel then showed elev trig so Dr. Acie Fredrickson rec work on lifestyle change on cont Crestor.  PAD: Chronic pain syndrome: Dr. Lynann Bologna prior than [facility] [place] about a year ago after her major "back?" surgery.  Did PT over the winter. Does get periodic steroid inj Polyneuropathy: gabapentin from pain clinic GERD: failed ranitidine alone Tobacco use: had cut down to 3-4 cig/d last yr. Tried switching to a vape.  Less than a pack.   She has smoked for 30 years but she has only got up to 12 cig at the very most Mood d/o: did she get an appt at [facility].  She sees Josph Macho there for a long time.  Insomnia: A sig prob even with 2 trazodones but likely the constant pain whenever she moves keeps her awake. She wakes up at every morning at 5 a.m.  Does not have cuff outside office  eGFR 72  Bowels terrible so she tries a lot of fiber foods. She fell again and has tears in her right rotator cuff and a "ragged" tendon so having surgery in July by Dr. Berenice Primas due to complete tears.  THis was after she got back to w/u 5d a  week.  = she has an ellipitcal and goes to [facility] with weights but unable to do all this with her shoulder injury.  She does do water aerobics. Right had dominant.  Also has carpal tunnel in her right wrist for which she sees Dr. Tresea Mall - all at [facility] to add in metamucin.    Breathing doing well, no need to use combivent.   Has MRI sched of SI joints though [facility]. The injections into her joints don't help much with pain but do spke her sugars.   Past Medical History:  Diagnosis Date  . Allergy   . Anxiety   . Arthritis   . Back pain, chronic   . Bipolar disorder ([place])   . Bronchitis    hx-no inhalers at home  . Carotid bruit   . Carpal tunnel syndrome on both sides   . Coronary artery disease   . DDD (degenerative disc disease), lumbar   . Depression   . Diabetes mellitus    diet controlled  . Ejection fraction    .  Marland Kitchen GERD (gastroesophageal reflux disease)   . Hx of CABG   . Hypercholesteremia   . Hypertension   . Hypothyroidism   . Multiple thyroid nodules   . Myocardial infarction ([place]) 11/20/06  . Neuromuscular disorder ([place])   . Nodule of neck  Past Surgical History:  Procedure Laterality Date  . ABDOMINAL HYSTERECTOMY  1984  . BREAST EXCISIONAL BIOPSY Right   . BREAST SURGERY     rt br cystx2  . CORONARY ARTERY BYPASS GRAFT  2008  . KNEE SURGERY  2009   lt   . ORIF ANKLE FRACTURE Right 07/12/2012   Procedure: OPEN REDUCTION INTERNAL FIXATION (ORIF) RIGHT ANKLE FRACTURE;  Surgeon: Alta Corning, MD;  Location: [facility];  Service: Orthopedics;  Laterality: Right;  . TUBAL LIGATION    . WRIST SURGERY Left    Current Outpatient Prescriptions on File Prior to Visit  Medication Sig Dispense Refill  . ACCU-CHEK FASTCLIX LANCETS MISC Use to check blood sugar 3 times daily. 306 each 1  . AMITIZA 24 MCG capsule Take 1 capsule by mouth 2 (two) times daily.    Marland Kitchen aspirin EC 81 MG tablet Take 81 mg by mouth 2 (two) times daily.      . Blood Glucose Monitoring Suppl (ACCU-CHEK GUIDE) w/Device KIT 1 each by Does not apply route 3 (three) times daily. 1 kit 1  . bromocriptine (PARLODEL) 2.5 MG tablet Take 0.5 tablets (1.25 mg total) by mouth daily. 45 tablet 1  . diazepam (VALIUM) 5 MG tablet Take 1-2 tablets (5-10 mg total) by mouth every 8 (eight) hours as needed for anxiety. Make sure to separate this by 4 hours from any narcotic such as the oxycodone. 60 tablet 0  . DULoxetine (CYMBALTA) 60 MG capsule Take 60 mg by mouth 2 (two) times daily.    . empagliflozin (JARDIANCE) 10 MG TABS tablet Take 10 mg by mouth daily. 30 tablet 11  . gabapentin (NEURONTIN) 600 MG tablet Take 600 mg by mouth 3 (three) times daily.    Marland Kitchen glucose blood (ACCU-CHEK GUIDE) test strip Use to check blood sugar 3 times daily. 300 each 1  . Ipratropium-Albuterol (COMBIVENT RESPIMAT) 20-100 MCG/ACT AERS respimat Inhale 1 puff into the lungs every 6 (six) hours. (Patient taking differently: Inhale 1 puff into the lungs as needed. ) 4 g 1  . levothyroxine (SYNTHROID, LEVOTHROID) 100 MCG tablet Take 1 tablet (100 mcg total) by mouth daily before breakfast. 30 tablet 11  . metFORMIN (GLUCOPHAGE-XR) 500 MG 24 hr tablet TAKE 2 TABLETS  DAILY WITH BREAKFAST. 180 tablet 3  . omeprazole (PRILOSEC) 40 MG capsule Take 1 capsule (40 mg total) by mouth daily. 30 minutes before a meal 90 capsule 3  . QUEtiapine (SEROQUEL) 200 MG tablet Take 200 mg by mouth 3 (three) times daily.    . ranitidine (ZANTAC) 150 MG tablet Take 1 tablet (150 mg total) by mouth 2 (two) times daily. 180 tablet 3  . rosuvastatin (CRESTOR) 20 MG tablet Take 1 tablet (20 mg total) by mouth daily. 90 tablet 3  . sitaGLIPtin (JANUVIA) 100 MG tablet Take 1 tablet (100 mg total) by mouth daily. 90 tablet 3  . traZODone (DESYREL) 50 MG tablet Take 50 mg by mouth at bedtime as needed for sleep.      No current facility-administered medications on file prior to visit.    Allergies  Allergen Reactions   . Coconut Flavor Swelling and Rash  . Ibuprofen Swelling and Rash  . Prednisone Swelling and Rash  . Cyclobenzaprine   . Morphine Nausea And Vomiting   Family History  Problem Relation Age of Onset  . Heart disease Mother   . Hyperlipidemia Mother   . Hypertension Mother   . Heart disease Father   .  Stroke Father   . Hypertension Father   . Hyperlipidemia Father   . Hypertension Sister   . Hypertension Brother   . Cancer Daughter        unknown  . Hypertension Maternal Grandmother   . Hypertension Maternal Grandfather   . Cancer Maternal Grandfather   . Hypertension Paternal Grandmother   . Hypertension Paternal Grandfather   . Cancer Maternal Aunt        breast and ovarian  . Cancer Paternal Aunt        breast  . Colon cancer Neg Hx    Social History   Social History  . Marital status: Married    Spouse name: N/A  . Number of children: N/A  . Years of education: N/A   Social History Main Topics  . Smoking status: Current Every Day Smoker    Packs/day: 0.25    Years: 29.00    Types: Cigarettes  . Smokeless tobacco: Never Used  . Alcohol use No  . Drug use: No  . Sexual activity: Not Asked   Other Topics Concern  . None   Social History Narrative  . None   Depression screen [facility] 2/9 10/05/2016 05/09/2016 02/02/2016 11/25/2015 10/01/2015  Decreased Interest 0 0 0 0 0  Down, Depressed, Hopeless 0 0 0 0 0  PHQ - 2 Score 0 0 0 0 0  Altered sleeping - - - - -  Tired, decreased energy - - - - -  Change in appetite - - - - -  Feeling bad or failure about yourself  - - - - -  Trouble concentrating - - - - -  Moving slowly or fidgety/restless - - - - -  Suicidal thoughts - - - - -  PHQ-9 Score - - - - -     Review of Systems See hpi    Objective:   Physical Exam  Constitutional: She is oriented to person, place, and time. She appears well-developed and well-nourished. No distress.  HENT:  Head: Normocephalic and atraumatic.  Right Ear: External ear normal.   Left Ear: External ear normal.  Eyes: Conjunctivae are normal. No scleral icterus.  Neck: Normal range of motion. Neck supple. No thyromegaly present.  Cardiovascular: Normal rate, regular rhythm, normal heart sounds and intact distal pulses.   Pulmonary/Chest: Effort normal and breath sounds normal. No respiratory distress.  Musculoskeletal: She exhibits no edema.  Lymphadenopathy:    She has no cervical adenopathy.  Neurological: She is alert and oriented to person, place, and time.  Skin: Skin is warm and dry. Rash noted. Rash is macular (port-wine mottling over lower ext). She is not diaphoretic. No erythema.  Psychiatric: She has a normal mood and affect. Her behavior is normal.    BP 135/76   Pulse 67   Temp 98.5 F (36.9 C) (Oral)   Resp 18   Ht 5' 1"  (1.549 m)   Wt 154 lb (69.9 kg)   SpO2 96%   BMI 29.10 kg/m      Lab Results  Component Value Date   VD25OH 30.8 10/05/2016   VD25OH 35 03/13/2014   VD25OH 37 09/29/2009    Assessment & Plan:  Last annual wellness visit?? Qualify for screening lung CT - pack years? - only about 15 Microalb/cr (last 11/25/15) Cbc - has not had rechecked since transfusion (last yr during surgery???)_367 She needs another referral to [facility] [place].  Iron, Chol 1. Pure hypercholesterolemia   2. Medication monitoring  encounter   3. Type 2 diabetes mellitus without complication, without long-term current use of insulin ([place])   4. Polycythemia, secondary   5. Tobacco use disorder   6. Chronic pain syndrome   7. Joint injury     Orders Placed This Encounter  Procedures  . CBC with Differential/Platelet  . Lipid panel    Order Specific Question:   Has the patient fasted?    Answer:   Yes  . Hepatic Function Panel  . Microalbumin/Creatinine Ratio, Urine  . Ferritin  . VITAMIN D 25 Hydroxy (Vit-D Deficiency, Fractures)  . VITAMIN D 25 Hydroxy (Vit-D Deficiency, Fractures)  . Ambulatory referral to  Pain Clinic    Referral Priority:   Routine    Referral Type:   Consultation    Referral Reason:   Specialty Services Required    Requested Specialty:   Pain Medicine    Number of Visits Requested:   1  . Care order/instruction:    Scheduling Instructions:     Complete orders, AVS and go.    Meds ordered this encounter  Medications  . oxyCODONE-acetaminophen (PERCOCET) 10-325 MG tablet    Sig: Take 1 tablet by mouth every 4 (four) hours as needed for pain.  . carvedilol (COREG) 3.125 MG tablet    Sig: TAKE 1 TABLET (3.125 MG TOTAL) BY MOUTH 2 (TWO) TIMES DAILY.    Dispense:  180 tablet    Refill:  3    Maximum Refills Reached      [name], M.D.  Primary Care at [facility] [address] [place], [place] [address] [phone] phone [phone] fax  10/07/16 9:34 PM

## 2016-10-05 ENCOUNTER — Other Ambulatory Visit: Payer: Self-pay | Admitting: Nurse Practitioner

## 2016-10-05 ENCOUNTER — Encounter: Payer: Self-pay | Admitting: Family Medicine

## 2016-10-05 ENCOUNTER — Ambulatory Visit (INDEPENDENT_AMBULATORY_CARE_PROVIDER_SITE_OTHER): Payer: Medicare HMO | Admitting: Family Medicine

## 2016-10-05 VITALS — BP 135/76 | HR 67 | Temp 98.5°F | Resp 18 | Ht 61.0 in | Wt 154.0 lb

## 2016-10-05 DIAGNOSIS — T148XXA Other injury of unspecified body region, initial encounter: Secondary | ICD-10-CM | POA: Diagnosis not present

## 2016-10-05 DIAGNOSIS — M47816 Spondylosis without myelopathy or radiculopathy, lumbar region: Secondary | ICD-10-CM

## 2016-10-05 DIAGNOSIS — G894 Chronic pain syndrome: Secondary | ICD-10-CM

## 2016-10-05 DIAGNOSIS — E119 Type 2 diabetes mellitus without complications: Secondary | ICD-10-CM

## 2016-10-05 DIAGNOSIS — R718 Other abnormality of red blood cells: Secondary | ICD-10-CM

## 2016-10-05 DIAGNOSIS — Z5181 Encounter for therapeutic drug level monitoring: Secondary | ICD-10-CM

## 2016-10-05 DIAGNOSIS — F17200 Nicotine dependence, unspecified, uncomplicated: Secondary | ICD-10-CM

## 2016-10-05 DIAGNOSIS — M949 Disorder of cartilage, unspecified: Secondary | ICD-10-CM

## 2016-10-05 DIAGNOSIS — E7800 Pure hypercholesterolemia, unspecified: Secondary | ICD-10-CM | POA: Diagnosis not present

## 2016-10-05 DIAGNOSIS — M899 Disorder of bone, unspecified: Secondary | ICD-10-CM | POA: Diagnosis not present

## 2016-10-05 DIAGNOSIS — D751 Secondary polycythemia: Secondary | ICD-10-CM

## 2016-10-05 DIAGNOSIS — G8929 Other chronic pain: Secondary | ICD-10-CM

## 2016-10-05 DIAGNOSIS — M25511 Pain in right shoulder: Secondary | ICD-10-CM

## 2016-10-05 MED ORDER — CARVEDILOL 3.125 MG PO TABS: ORAL_TABLET | ORAL | 3 refills | Status: DC

## 2016-10-05 NOTE — Patient Instructions (Signed)
     IF you received an x-ray today, you will receive an invoice from [facility]. Please contact [facility] at [phone] with questions or concerns regarding your invoice.   IF you received labwork today, you will receive an invoice from [facility]. Please contact [facility] at [phone] with questions or concerns regarding your invoice.   Our billing staff will not be able to assist you with questions regarding bills from these companies.  You will be contacted with the lab results as soon as they are available. The fastest way to get your results is to activate your My Chart account. Instructions are located on the last page of this paperwork. If you have not heard from us regarding the results in 2 weeks, please contact this office.     

## 2016-10-06 LAB — LIPID PANEL
Chol/HDL Ratio: 3.8 ratio (ref 0.0–4.4)
Cholesterol, Total: 143 mg/dL (ref 100–199)
HDL: 38 mg/dL — ABNORMAL LOW (ref >39)
LDL Calculated: 73 mg/dL (ref 0–99)
Triglycerides: 158 mg/dL — ABNORMAL HIGH (ref 0–149)
VLDL Cholesterol Cal: 32 mg/dL (ref 5–40)

## 2016-10-06 LAB — CBC WITH DIFFERENTIAL/PLATELET
Basophils Absolute: 0.1 10*3/uL (ref 0.0–0.2)
Basos: 0 %
EOS (ABSOLUTE): 0.1 10*3/uL (ref 0.0–0.4)
Eos: 1 %
Hematocrit: 49.2 % — ABNORMAL HIGH (ref 34.0–46.6)
Hemoglobin: 16 g/dL — ABNORMAL HIGH (ref 11.1–15.9)
Immature Grans (Abs): 0.1 10*3/uL (ref 0.0–0.1)
Immature Granulocytes: 1 %
Lymphocytes Absolute: 3.8 10*3/uL — ABNORMAL HIGH (ref 0.7–3.1)
Lymphs: 32 %
MCH: 28.8 pg (ref 26.6–33.0)
MCHC: 32.5 g/dL (ref 31.5–35.7)
MCV: 89 fL (ref 79–97)
Monocytes Absolute: 0.8 10*3/uL (ref 0.1–0.9)
Monocytes: 7 %
Neutrophils Absolute: 7 10*3/uL (ref 1.4–7.0)
Neutrophils: 59 %
Platelets: 248 10*3/uL (ref 150–379)
RBC: 5.55 x10E6/uL — ABNORMAL HIGH (ref 3.77–5.28)
RDW: 15.3 % (ref 12.3–15.4)
WBC: 11.8 10*3/uL — ABNORMAL HIGH (ref 3.4–10.8)

## 2016-10-06 LAB — HEPATIC FUNCTION PANEL
ALT: 8 IU/L (ref 0–32)
AST: 15 IU/L (ref 0–40)
Albumin: 4.2 g/dL (ref 3.5–5.5)
Alkaline Phosphatase: 94 IU/L (ref 39–117)
BILIRUBIN TOTAL: 0.2 mg/dL (ref 0.0–1.2)
BILIRUBIN, DIRECT: 0.08 mg/dL (ref 0.00–0.40)
Total Protein: 6.9 g/dL (ref 6.0–8.5)

## 2016-10-06 LAB — MICROALBUMIN / CREATININE URINE RATIO
Creatinine, Urine: 38.8 mg/dL
Microalb/Creat Ratio: <7.7 mg/g creat (ref 0.0–30.0)
Microalbumin, Urine: <3 ug/mL

## 2016-10-06 LAB — VITAMIN D 25 HYDROXY (VIT D DEFICIENCY, FRACTURES): VIT D 25 HYDROXY: 30.8 ng/mL (ref 30.0–100.0)

## 2016-10-06 LAB — FERRITIN: FERRITIN: 44 ng/mL (ref 15–150)

## 2016-10-09 ENCOUNTER — Encounter: Payer: Self-pay | Admitting: Family Medicine

## 2016-10-11 ENCOUNTER — Encounter: Payer: Self-pay | Admitting: Family Medicine

## 2016-10-11 ENCOUNTER — Telehealth: Payer: Self-pay | Admitting: Cardiovascular Disease

## 2016-10-11 NOTE — Telephone Encounter (Signed)
Patient takes Aspirin 81 mg

## 2016-10-11 NOTE — Telephone Encounter (Signed)
New message     Request for surgical clearance:  1. What type of surgery is being performed?  Rt shoulder orthoscopy  2. When is this surgery scheduled? Not schedule yet  3. Are there any medications that need to be held prior to surgery and how long? Not sure if pt is taking any , they need pre op and post op instructions for pt   4. Name of physician performing surgery? Dr Dorna Leitz    5. What is your office phone and fax number?  [phone] fax [phone]

## 2016-10-12 NOTE — Telephone Encounter (Signed)
Clearance printed and placed in HIM to be faxed to [phone]

## 2016-10-12 NOTE — Telephone Encounter (Signed)
Pt is at low risk for her shoulder surgery  I would prefer that she continue her ASA 81 mg a day . If Dr. Berenice Primas absolutely needs her to hold the ASA , she may hold it for 5 days prior to procedure  And restart as soon as the wounds are stable - preferably the day following surgery

## 2016-10-15 ENCOUNTER — Ambulatory Visit
Admission: RE | Admit: 2016-10-15 | Discharge: 2016-10-15 | Disposition: A | Discharge status: Home / Self Care | Payer: Medicare HMO | Source: Ambulatory Visit | Attending: Nurse Practitioner | Admitting: Nurse Practitioner

## 2016-10-15 DIAGNOSIS — G8929 Other chronic pain: Secondary | ICD-10-CM

## 2016-10-15 DIAGNOSIS — M47816 Spondylosis without myelopathy or radiculopathy, lumbar region: Secondary | ICD-10-CM | POA: Diagnosis not present

## 2016-10-17 ENCOUNTER — Encounter: Payer: Self-pay | Admitting: Family Medicine

## 2016-10-19 ENCOUNTER — Encounter: Payer: Self-pay | Admitting: Family Medicine

## 2016-10-27 ENCOUNTER — Encounter: Payer: Self-pay | Admitting: Family Medicine

## 2016-11-08 DIAGNOSIS — G8918 Other acute postprocedural pain: Secondary | ICD-10-CM | POA: Diagnosis not present

## 2016-11-08 DIAGNOSIS — S43431A Superior glenoid labrum lesion of right shoulder, initial encounter: Secondary | ICD-10-CM | POA: Diagnosis not present

## 2016-11-08 DIAGNOSIS — M7521 Bicipital tendinitis, right shoulder: Secondary | ICD-10-CM | POA: Diagnosis not present

## 2016-11-08 DIAGNOSIS — S46211A Strain of muscle, fascia and tendon of other parts of biceps, right arm, initial encounter: Secondary | ICD-10-CM | POA: Diagnosis not present

## 2016-11-08 DIAGNOSIS — M7541 Impingement syndrome of right shoulder: Secondary | ICD-10-CM | POA: Diagnosis not present

## 2016-11-08 DIAGNOSIS — M75111 Incomplete rotator cuff tear or rupture of right shoulder, not specified as traumatic: Secondary | ICD-10-CM | POA: Diagnosis not present

## 2016-11-08 DIAGNOSIS — S46011A Strain of muscle(s) and tendon(s) of the rotator cuff of right shoulder, initial encounter: Secondary | ICD-10-CM | POA: Diagnosis not present

## 2016-11-08 DIAGNOSIS — M19011 Primary osteoarthritis, right shoulder: Secondary | ICD-10-CM | POA: Diagnosis not present

## 2016-11-08 HISTORY — PX: SHOULDER SURGERY: SHX246

## 2016-11-14 ENCOUNTER — Encounter: Payer: Self-pay | Admitting: Family Medicine

## 2016-11-14 DIAGNOSIS — Z9889 Other specified postprocedural states: Secondary | ICD-10-CM | POA: Diagnosis not present

## 2016-11-17 DIAGNOSIS — M25611 Stiffness of right shoulder, not elsewhere classified: Secondary | ICD-10-CM | POA: Diagnosis not present

## 2016-11-17 DIAGNOSIS — M25511 Pain in right shoulder: Secondary | ICD-10-CM | POA: Diagnosis not present

## 2016-11-22 ENCOUNTER — Ambulatory Visit (INDEPENDENT_AMBULATORY_CARE_PROVIDER_SITE_OTHER): Payer: Medicare HMO | Admitting: Family Medicine

## 2016-11-22 ENCOUNTER — Encounter: Payer: Self-pay | Admitting: Family Medicine

## 2016-11-22 VITALS — BP 98/62 | HR 76 | Temp 98.7°F | Resp 18 | Ht 62.0 in | Wt 153.2 lb

## 2016-11-22 DIAGNOSIS — K5903 Drug induced constipation: Secondary | ICD-10-CM

## 2016-11-22 DIAGNOSIS — M48062 Spinal stenosis, lumbar region with neurogenic claudication: Secondary | ICD-10-CM

## 2016-11-22 DIAGNOSIS — G8929 Other chronic pain: Secondary | ICD-10-CM | POA: Diagnosis not present

## 2016-11-22 DIAGNOSIS — M5440 Lumbago with sciatica, unspecified side: Secondary | ICD-10-CM

## 2016-11-22 DIAGNOSIS — T402X5A Adverse effect of other opioids, initial encounter: Secondary | ICD-10-CM | POA: Diagnosis not present

## 2016-11-22 DIAGNOSIS — G4700 Insomnia, unspecified: Secondary | ICD-10-CM

## 2016-11-22 DIAGNOSIS — M5136 Other intervertebral disc degeneration, lumbar region: Secondary | ICD-10-CM

## 2016-11-22 DIAGNOSIS — M25511 Pain in right shoulder: Secondary | ICD-10-CM | POA: Diagnosis not present

## 2016-11-22 DIAGNOSIS — M25611 Stiffness of right shoulder, not elsewhere classified: Secondary | ICD-10-CM | POA: Diagnosis not present

## 2016-11-22 NOTE — Patient Instructions (Signed)
     IF you received an x-ray today, you will receive an invoice from [facility]. Please contact [facility] at [phone] with questions or concerns regarding your invoice.   IF you received labwork today, you will receive an invoice from [facility]. Please contact [facility] at [phone] with questions or concerns regarding your invoice.   Our billing staff will not be able to assist you with questions regarding bills from these companies.  You will be contacted with the lab results as soon as they are available. The fastest way to get your results is to activate your My Chart account. Instructions are located on the last page of this paperwork. If you have not heard from us regarding the results in 2 weeks, please contact this office.     

## 2016-11-22 NOTE — Progress Notes (Signed)
By signing my name below, I, Mesha Guinyard, attest that this documentation has been prepared under the direction and in the presence of [name], MD.  Electronically Signed: Verlee Monte, Medical Scribe. 11/22/16. 12:44 PM.  Subjective:    Patient ID: [name], female    DOB: 04/11/1961, 56 y.o.   MRN: [id number]  HPI Chief Complaint  Patient presents with  . Medication Refill    valium, gabapentin, amitiza, zantac, crestor     HPI Comments: [name] is a 56 y.o. female who presents to Primary Care at [facility] for medication refill.   Pain Management: The pain clinic in [place] closed June 13, and the other in [place] July 25th. Pt has an appt with a pain clinic in [place] on Aug 2nd. Pt is taking citrucel and occasionally she'll have to take ex-lax for relief of constipation. Pt needs a refill for gabapentin. She takes diazepam PRN. Pt does not smoke in the house or in the car.  Throat: Ever since her surgery she feels like she has a cold in her throat. She was intubated with local anesthesia. Reports associated sxs of sneezing, coughing, and nasal congestion.  DM: She mentions her blood sugar has been doing good, but occasionally has low blood sugar in the morning. She eats banana, and blue berries int he morning, and stops eats by 5pm. Pt takes [place] at night.   Patient Active Problem List   Diagnosis Date Noted  . Radiculopathy 10/21/2015  . Diabetes ([place]) 07/29/2015  . 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 Multinodular goiter 09/15/2013  . Carotid bruit   . Depression   . Coronary artery disease   . Hypertension   . Hyperlipidemia   . Neuromuscular disorder ([place])   . Arthritis   . DDD (degenerative disc disease), lumbar   . Ejection fraction   . Hx of CABG   . Glucose intolerance (pre-diabetes) 02/08/2012  . Encounter for long-term (current) use of other medications 02/08/2012  . Spinal stenosis of lumbar region  02/08/2012  . Neuropathy 02/08/2012  . Superficial phlebitis and thrombophlebitis of the leg 04/09/2011  . Back pain, chronic   . TOBACCO ABUSE 11/20/2008   Past Medical History:  Diagnosis Date  . Allergy   . Anxiety   . Arthritis   . Back pain, chronic   . Bipolar disorder ([place])   . Bronchitis    hx-no inhalers at home  . Carotid bruit   . Carpal tunnel syndrome on both sides   . Coronary artery disease   . DDD (degenerative disc disease), lumbar   . Depression   . Diabetes mellitus    diet controlled  . Ejection fraction    .  Marland Kitchen GERD (gastroesophageal reflux disease)   . Hx of CABG   . Hypercholesteremia   . Hypertension   . Hypothyroidism   . Multiple thyroid nodules   . Myocardial infarction ([place]) 11/20/06  . Neuromuscular disorder ([place])   . Nodule of neck    Past Surgical History:  Procedure Laterality Date  . ABDOMINAL HYSTERECTOMY  1984  . BREAST EXCISIONAL BIOPSY Right   . BREAST SURGERY     rt br cystx2  . CORONARY ARTERY BYPASS GRAFT  2008  . KNEE SURGERY  2009   lt   . ORIF ANKLE FRACTURE Right 07/12/2012   Procedure: OPEN REDUCTION INTERNAL FIXATION (ORIF) RIGHT ANKLE FRACTURE;  Surgeon: Alta Corning, MD;  Location: [facility];  Service: Orthopedics;  Laterality: Right;  . SHOULDER SURGERY  11/08/2016  . TUBAL LIGATION    . WRIST SURGERY Left    Allergies  Allergen Reactions  . Coconut Flavor Swelling and Rash  . Ibuprofen Swelling and Rash  . Prednisone Swelling and Rash  . Cyclobenzaprine   . Morphine Nausea And Vomiting   Prior to Admission medications   Medication Sig Start Date End Date Taking? Authorizing Provider  ACCU-CHEK FASTCLIX LANCETS MISC Use to check blood sugar 3 times daily. 08/15/16  Yes Renato Shin, MD  AMITIZA 24 MCG capsule Take 1 capsule by mouth 2 (two) times daily. 05/01/16  Yes [provider]  aspirin EC 81 MG tablet Take 81 mg by mouth 2 (two) times daily.   Yes [provider]  Blood  Glucose Monitoring Suppl (ACCU-CHEK GUIDE) w/Device KIT 1 each by Does not apply route 3 (three) times daily. 08/15/16  Yes Renato Shin, MD  bromocriptine (PARLODEL) 2.5 MG tablet Take 0.5 tablets (1.25 mg total) by mouth daily. 06/09/16  Yes Renato Shin, MD  carvedilol (COREG) 3.125 MG tablet TAKE 1 TABLET (3.125 MG TOTAL) BY MOUTH 2 (TWO) TIMES DAILY. 10/05/16  Yes Shawnee Knapp, MD  diazepam (VALIUM) 5 MG tablet Take 1-2 tablets (5-10 mg total) by mouth every 8 (eight) hours as needed for anxiety. Make sure to separate this by 4 hours from any narcotic such as the oxycodone. 05/09/16  Yes Shawnee Knapp, MD  DULoxetine (CYMBALTA) 60 MG capsule Take 60 mg by mouth 2 (two) times daily.   Yes [provider]  empagliflozin (JARDIANCE) 10 MG TABS tablet Take 10 mg by mouth daily. 06/02/16  Yes Renato Shin, MD  gabapentin (NEURONTIN) 600 MG tablet Take 600 mg by mouth 3 (three) times daily.   Yes [provider]  glucose blood (ACCU-CHEK GUIDE) test strip Use to check blood sugar 3 times daily. 08/15/16  Yes Renato Shin, MD  Ipratropium-Albuterol (COMBIVENT RESPIMAT) 20-100 MCG/ACT AERS respimat Inhale 1 puff into the lungs every 6 (six) hours. Patient taking differently: Inhale 1 puff into the lungs as needed.  12/16/13  Yes Shawnee Knapp, MD  levothyroxine (SYNTHROID, LEVOTHROID) 100 MCG tablet Take 1 tablet (100 mcg total) by mouth daily before breakfast. 06/02/16  Yes Renato Shin, MD  metFORMIN (GLUCOPHAGE-XR) 500 MG 24 hr tablet TAKE 2 TABLETS  DAILY WITH BREAKFAST. 05/15/16  Yes Renato Shin, MD  omeprazole (PRILOSEC) 40 MG capsule Take 1 capsule (40 mg total) by mouth daily. 30 minutes before a meal 02/16/16  Yes Shawnee Knapp, MD  oxyCODONE-acetaminophen (PERCOCET) 10-325 MG tablet Take 1 tablet by mouth every 4 (four) hours as needed for pain.   Yes [provider]  QUEtiapine (SEROQUEL) 200 MG tablet Take 200 mg by mouth 3 (three) times daily.   Yes [provider]    ranitidine (ZANTAC) 150 MG tablet Take 1 tablet (150 mg total) by mouth 2 (two) times daily. 02/16/16  Yes Shawnee Knapp, MD  rosuvastatin (CRESTOR) 20 MG tablet Take 1 tablet (20 mg total) by mouth daily. 02/22/16  Yes Shawnee Knapp, MD  sitaGLIPtin (JANUVIA) 100 MG tablet Take 1 tablet (100 mg total) by mouth daily. 11/01/15  Yes Renato Shin, MD  traZODone (DESYREL) 50 MG tablet Take 50 mg by mouth at bedtime as needed for sleep.    Yes [provider]   Social History   Social History  . Marital status: Married    Spouse name: N/A  .  Number of children: N/A  . Years of education: N/A   Occupational History  . Not on file.   Social History Main Topics  . Smoking status: Current Every Day Smoker    Packs/day: 0.25    Years: 29.00    Types: Cigarettes  . Smokeless tobacco: Never Used  . Alcohol use No  . Drug use: No  . Sexual activity: Not on file   Other Topics Concern  . Not on file   Social History Narrative  . No narrative on file   Review of Systems  HENT: Positive for congestion and sneezing.   Respiratory: Positive for cough.    Objective:  Physical Exam  Constitutional: She appears well-developed and well-nourished. No distress.  HENT:  Head: Normocephalic and atraumatic.  Eyes: Conjunctivae are normal.  Neck: Neck supple. No thyroid mass and no thyromegaly present.  Cardiovascular: Normal rate, regular rhythm and normal heart sounds.  Exam reveals no gallop and no friction rub.   No murmur heard. Pulmonary/Chest: Effort normal and breath sounds normal. No respiratory distress. She has no wheezes. She has no rhonchi. She has no rales.  Lymphadenopathy:    She has no cervical adenopathy.  Neurological: She is alert.  Skin: Skin is warm and dry.  Psychiatric: She has a normal mood and affect. Her behavior is normal.  Nursing note and vitals reviewed.   Vitals:   11/22/16 1152  BP: 98/62  Pulse: 76  Resp: 18  Temp: 98.7 F (37.1 C)  TempSrc:  Oral  SpO2: 97%  Weight: 153 lb 3.2 oz (69.5 kg)  Height: 5' 2"  (1.575 m)   Body mass index is 28.02 kg/m. Assessment & Plan:   1. Therapeutic opioid induced constipation - failed amitiza 24 bid, try methylnaltrexone  2. Insomnia, unspecified type - try increasing trazodone 50 to 100 - in also on cymbalta 60 bid and on numerous sedating meds (max dose gabapentin 800 qid, seroquel 200 tid, oxycodone 10 6x/d. If fails higher dose trazodone, consider belsomra or rozerem (would need PAs)  3. DDD (degenerative disc disease), lumbar   4. Chronic low back pain with sciatica, sciatica laterality unspecified, unspecified back pain laterality - has appt with new pain mngmnt clinic in several wks, has enough opioid to bridge but needs gabapentin - refilled at current dose.  5. Spinal stenosis of lumbar region with neurogenic claudication - refilled rare prn valium - only uses for acute muscle spasm flairs a few times a month - aware of very high risk of bzd and opioid mix.      Meds ordered this encounter  Medications  . DISCONTD: gabapentin (NEURONTIN) 800 MG tablet    Sig: Take 1 tablet (800 mg total) by mouth 4 (four) times daily.    Dispense:  360 tablet    Refill:  1  . gabapentin (NEURONTIN) 800 MG tablet    Sig: Take 1 tablet (800 mg total) by mouth 4 (four) times daily.    Dispense:  120 tablet    Refill:  0  . diazepam (VALIUM) 5 MG tablet    Sig: Take 1-2 tablets (5-10 mg total) by mouth every 8 (eight) hours as needed for anxiety or muscle spasms. Make sure to separate this by 4 hours from any narcotic such as the oxycodone.    Dispense:  60 tablet    Refill:  0    Please fax to [facility] mail delivery F: [phone]  . metFORMIN (GLUCOPHAGE) 1000 MG tablet  Sig: Take 1 tablet (1,000 mg total) by mouth daily with breakfast.    Dispense:  90 tablet    Refill:  1    I personally performed the services described in this documentation, which was scribed in my presence. The  recorded information has been reviewed and considered, and addended by me as needed.   [name], M.D.  Primary Care at [facility] [address] [place], [place] [address] [phone] phone [phone] fax  11/24/16 11:20 PM

## 2016-11-23 ENCOUNTER — Encounter: Payer: Self-pay | Admitting: Family Medicine

## 2016-11-23 MED ORDER — DIAZEPAM 5 MG PO TABS: 5.0000 mg | ORAL_TABLET | Freq: Three times a day (TID) | ORAL | 0 refills | Status: DC | PRN

## 2016-11-23 MED ORDER — GABAPENTIN 800 MG PO TABS: 800.0000 mg | ORAL_TABLET | Freq: Four times a day (QID) | ORAL | 1 refills | Status: DC

## 2016-11-23 MED ORDER — GABAPENTIN 800 MG PO TABS: 800.0000 mg | ORAL_TABLET | Freq: Four times a day (QID) | ORAL | 0 refills | Status: DC

## 2016-11-23 MED ORDER — METFORMIN HCL 1000 MG PO TABS: 1000.0000 mg | ORAL_TABLET | Freq: Every day | ORAL | 1 refills | Status: DC

## 2016-11-24 MED ORDER — METHYLNALTREXONE BROMIDE 150 MG PO TABS: 3.0000 | ORAL_TABLET | Freq: Every day | ORAL | 2 refills | Status: DC

## 2016-11-24 MED ORDER — TRAZODONE HCL 100 MG PO TABS: 100.0000 mg | ORAL_TABLET | Freq: Every evening | ORAL | 2 refills | Status: DC | PRN

## 2016-11-28 DIAGNOSIS — M25611 Stiffness of right shoulder, not elsewhere classified: Secondary | ICD-10-CM | POA: Diagnosis not present

## 2016-11-28 DIAGNOSIS — M25511 Pain in right shoulder: Secondary | ICD-10-CM | POA: Diagnosis not present

## 2016-11-30 DIAGNOSIS — G8929 Other chronic pain: Secondary | ICD-10-CM | POA: Diagnosis not present

## 2016-11-30 DIAGNOSIS — Z79899 Other long term (current) drug therapy: Secondary | ICD-10-CM | POA: Diagnosis not present

## 2016-11-30 DIAGNOSIS — M4696 Unspecified inflammatory spondylopathy, lumbar region: Secondary | ICD-10-CM | POA: Diagnosis not present

## 2016-11-30 DIAGNOSIS — Z5181 Encounter for therapeutic drug level monitoring: Secondary | ICD-10-CM | POA: Diagnosis not present

## 2016-11-30 DIAGNOSIS — M5441 Lumbago with sciatica, right side: Secondary | ICD-10-CM | POA: Diagnosis not present

## 2016-11-30 DIAGNOSIS — M961 Postlaminectomy syndrome, not elsewhere classified: Secondary | ICD-10-CM | POA: Diagnosis not present

## 2016-12-01 ENCOUNTER — Telehealth: Payer: Self-pay

## 2016-12-01 NOTE — Telephone Encounter (Signed)
Checked cover my meds for status of diazepam.  Outcome was favorable, and patient is approved from 11/30/2016-11/30/2017.  LMVM stating this.

## 2016-12-08 DIAGNOSIS — M25511 Pain in right shoulder: Secondary | ICD-10-CM | POA: Diagnosis not present

## 2016-12-08 DIAGNOSIS — M25611 Stiffness of right shoulder, not elsewhere classified: Secondary | ICD-10-CM | POA: Diagnosis not present

## 2016-12-11 DIAGNOSIS — F319 Bipolar disorder, unspecified: Secondary | ICD-10-CM | POA: Diagnosis not present

## 2016-12-15 DIAGNOSIS — M533 Sacrococcygeal disorders, not elsewhere classified: Secondary | ICD-10-CM | POA: Diagnosis not present

## 2016-12-21 DIAGNOSIS — M25511 Pain in right shoulder: Secondary | ICD-10-CM | POA: Diagnosis not present

## 2016-12-21 DIAGNOSIS — Z9889 Other specified postprocedural states: Secondary | ICD-10-CM | POA: Diagnosis not present

## 2016-12-21 DIAGNOSIS — M25611 Stiffness of right shoulder, not elsewhere classified: Secondary | ICD-10-CM | POA: Diagnosis not present

## 2016-12-27 ENCOUNTER — Encounter: Payer: Self-pay | Admitting: Family Medicine

## 2016-12-27 DIAGNOSIS — M25611 Stiffness of right shoulder, not elsewhere classified: Secondary | ICD-10-CM | POA: Diagnosis not present

## 2016-12-27 DIAGNOSIS — M25511 Pain in right shoulder: Secondary | ICD-10-CM | POA: Diagnosis not present

## 2016-12-28 DIAGNOSIS — M4696 Unspecified inflammatory spondylopathy, lumbar region: Secondary | ICD-10-CM | POA: Diagnosis not present

## 2016-12-28 DIAGNOSIS — M961 Postlaminectomy syndrome, not elsewhere classified: Secondary | ICD-10-CM | POA: Diagnosis not present

## 2016-12-28 DIAGNOSIS — M5441 Lumbago with sciatica, right side: Secondary | ICD-10-CM | POA: Diagnosis not present

## 2016-12-28 DIAGNOSIS — G8929 Other chronic pain: Secondary | ICD-10-CM | POA: Diagnosis not present

## 2016-12-29 ENCOUNTER — Ambulatory Visit (INDEPENDENT_AMBULATORY_CARE_PROVIDER_SITE_OTHER): Payer: Medicare HMO | Admitting: Cardiovascular Disease

## 2016-12-29 ENCOUNTER — Encounter: Payer: Self-pay | Admitting: Cardiovascular Disease

## 2016-12-29 VITALS — BP 108/70 | HR 60 | Ht 62.0 in | Wt 153.8 lb

## 2016-12-29 DIAGNOSIS — I2510 Atherosclerotic heart disease of native coronary artery without angina pectoris: Secondary | ICD-10-CM | POA: Diagnosis not present

## 2016-12-29 DIAGNOSIS — E782 Mixed hyperlipidemia: Secondary | ICD-10-CM

## 2016-12-29 LAB — HEPATIC FUNCTION PANEL
ALBUMIN: 4.1 g/dL (ref 3.5–5.5)
ALT: 6 IU/L (ref 0–32)
AST: 11 IU/L (ref 0–40)
Alkaline Phosphatase: 110 IU/L (ref 39–117)
BILIRUBIN, DIRECT: 0.08 mg/dL (ref 0.00–0.40)
Bilirubin Total: <0.2 mg/dL (ref 0.0–1.2)
TOTAL PROTEIN: 6.4 g/dL (ref 6.0–8.5)

## 2016-12-29 LAB — BASIC METABOLIC PANEL
BUN/Creatinine Ratio: 16 (ref 9–23)
BUN: 12 mg/dL (ref 6–24)
CALCIUM: 9.3 mg/dL (ref 8.7–10.2)
CO2: 21 mmol/L (ref 20–29)
CREATININE: 0.77 mg/dL (ref 0.57–1.00)
Chloride: 105 mmol/L (ref 96–106)
GFR calc Af Amer: 101 mL/min/{1.73_m2} (ref >59)
GFR, EST NON AFRICAN AMERICAN: 87 mL/min/{1.73_m2} (ref >59)
Glucose: 113 mg/dL — ABNORMAL HIGH (ref 65–99)
POTASSIUM: 4.4 mmol/L (ref 3.5–5.2)
Sodium: 142 mmol/L (ref 134–144)

## 2016-12-29 LAB — LIPID PANEL
CHOLESTEROL TOTAL: 132 mg/dL (ref 100–199)
Chol/HDL Ratio: 4 ratio (ref 0.0–4.4)
HDL: 33 mg/dL — ABNORMAL LOW (ref >39)
LDL CALC: 46 mg/dL (ref 0–99)
Triglycerides: 265 mg/dL — ABNORMAL HIGH (ref 0–149)
VLDL Cholesterol Cal: 53 mg/dL — ABNORMAL HIGH (ref 5–40)

## 2016-12-29 NOTE — Patient Instructions (Signed)
Medication Instructions:  Your physician recommends that you continue on your current medications as directed. Please refer to the Current Medication list given to you today.   Labwork: TODAY - cholesterol, basic metabolic panel, liver panel   Testing/Procedures: None Ordered   Follow-Up: Your physician wants you to follow-up in: 1 year with Dr. Acie Fredrickson.  You will receive a reminder letter in the mail two months in advance. If you don't receive a letter, please call our office to schedule the follow-up appointment.   If you need a refill on your cardiac medications before your next appointment, please call your pharmacy.   Thank you for choosing [facility]! Christen Bame, RN [phone]

## 2016-12-29 NOTE — Progress Notes (Signed)
Electrophysiology Office Note   Date:  12/29/2016   ID:  FALLEN CRISOSTOMO, DOB January 24, 1961, MRN [id number]  PCP:  Shawnee Knapp, MD  Cardiologist:  Gus Rankin Cardiologist :   Mertie Moores, MD    No chief complaint on file.    Previous notes from [employer] / Ron Parker :  Ashia Dehner Lopez-Valencia is a 56 y.o. female who presents today for electrophysiology evaluation.   She has a history of CAD.  Had CABG 2008 with a  Nuclear stress test in 2014 showing no ischemia.  LVEF was good at that time.  She has been feeling well since her last cardiology visit. She has no chest pain, shortness of breath, PND, or orthopnea. She says that he feels like she would be able to climb a flight of stairs without issue if it were not for her back. She is able to walk on flat ground without issues as well. She says that after she eats dinner, she does with him all or to [facility] with her husband to walk around and is not limited until her legs aren't bothering her.  Today, she denies symptoms of palpitations, chest pain, shortness of breath, orthopnea, PND, lower extremity edema, claudication, dizziness, presyncope, syncope, bleeding, or neurologic sequela. The patient is tolerating medications without difficulties and is otherwise without complaint today.   05/30/2016:   Kaoru is seen today for an initial visit. She is a previous patient of Dr. Ron Parker and has been seen in the interim by Dr. Curt Bears.     Hx of CABG in 2008 - after a MI  Does not have any cardiac complaints.  Has had some episodes of sudden weakness.   Not similar to her previoius episode of angina / MI  May be glucose issue No recent exercise   Smokes - 6 cigarette a day ,   Vapes.    Aug. 31, 2018:  Charmelle is seen today for follow up of her CAD , CABG in 2008 - at age 7.   Has lost weight. Had been working but had a shoulder injury  - doing rehab.  Has lost 16 lbs.  Hb A1C is now 6.4 No CP , no dyspnea   Past Medical History:    Diagnosis Date  . Allergy   . Anxiety   . Arthritis   . Back pain, chronic   . Bipolar disorder ([place])   . Bronchitis    hx-no inhalers at home  . Carotid bruit   . Carpal tunnel syndrome on both sides   . Coronary artery disease   . DDD (degenerative disc disease), lumbar   . Depression   . Diabetes mellitus    diet controlled  . Ejection fraction    .  Marland Kitchen GERD (gastroesophageal reflux disease)   . Hx of CABG   . Hypercholesteremia   . Hypertension   . Hypothyroidism   . Multiple thyroid nodules   . Myocardial infarction ([place]) 11/20/06  . Neuromuscular disorder ([place])   . Nodule of neck    Past Surgical History:  Procedure Laterality Date  . ABDOMINAL HYSTERECTOMY  1984  . BREAST EXCISIONAL BIOPSY Right   . BREAST SURGERY     rt br cystx2  . CORONARY ARTERY BYPASS GRAFT  2008  . KNEE SURGERY  2009   lt   . ORIF ANKLE FRACTURE Right 07/12/2012   Procedure: OPEN REDUCTION INTERNAL FIXATION (ORIF) RIGHT ANKLE FRACTURE;  Surgeon: Alta Corning, MD;  Location: [facility];  Service: Orthopedics;  Laterality: Right;  . SHOULDER SURGERY  11/08/2016  . TUBAL LIGATION    . WRIST SURGERY Left      Current Outpatient Prescriptions  Medication Sig Dispense Refill  . ACCU-CHEK FASTCLIX LANCETS MISC Use to check blood sugar 3 times daily. 306 each 1  . aspirin EC 81 MG tablet Take 81 mg by mouth 2 (two) times daily.    . Blood Glucose Monitoring Suppl (ACCU-CHEK GUIDE) w/Device KIT 1 each by Does not apply route 3 (three) times daily. 1 kit 1  . bromocriptine (PARLODEL) 2.5 MG tablet Take 0.5 tablets (1.25 mg total) by mouth daily. 45 tablet 1  . carvedilol (COREG) 3.125 MG tablet TAKE 1 TABLET (3.125 MG TOTAL) BY MOUTH 2 (TWO) TIMES DAILY. 180 tablet 3  . diazepam (VALIUM) 5 MG tablet Take 1-2 tablets (5-10 mg total) by mouth every 8 (eight) hours as needed for anxiety or muscle spasms. Make sure to separate this by 4 hours from any narcotic such as the oxycodone. 60  tablet 0  . DULoxetine (CYMBALTA) 60 MG capsule Take 60 mg by mouth 2 (two) times daily.    . empagliflozin (JARDIANCE) 10 MG TABS tablet Take 10 mg by mouth daily. 30 tablet 11  . gabapentin (NEURONTIN) 800 MG tablet Take 1 tablet (800 mg total) by mouth 4 (four) times daily. 120 tablet 0  . glucose blood (ACCU-CHEK GUIDE) test strip Use to check blood sugar 3 times daily. 300 each 1  . Ipratropium-Albuterol (COMBIVENT RESPIMAT) 20-100 MCG/ACT AERS respimat Inhale 1 puff into the lungs every 6 (six) hours. (Patient taking differently: Inhale 1 puff into the lungs as needed. ) 4 g 1  . levothyroxine (SYNTHROID, LEVOTHROID) 100 MCG tablet Take 1 tablet (100 mcg total) by mouth daily before breakfast. 30 tablet 11  . metFORMIN (GLUCOPHAGE) 1000 MG tablet Take 1 tablet (1,000 mg total) by mouth daily with breakfast. 90 tablet 1  . Methylnaltrexone Bromide 150 MG TABS Take 3 tablets by mouth daily. 90 tablet 2  . omeprazole (PRILOSEC) 40 MG capsule Take 1 capsule (40 mg total) by mouth daily. 30 minutes before a meal 90 capsule 3  . oxyCODONE-acetaminophen (PERCOCET) 10-325 MG tablet Take 1 tablet by mouth every 4 (four) hours as needed for pain.    Marland Kitchen QUEtiapine (SEROQUEL) 200 MG tablet Take 200 mg by mouth 3 (three) times daily.    . ranitidine (ZANTAC) 150 MG tablet Take 1 tablet (150 mg total) by mouth 2 (two) times daily. 180 tablet 3  . rosuvastatin (CRESTOR) 20 MG tablet Take 1 tablet (20 mg total) by mouth daily. 90 tablet 3  . sitaGLIPtin (JANUVIA) 100 MG tablet Take 1 tablet (100 mg total) by mouth daily. 90 tablet 3  . traZODone (DESYREL) 100 MG tablet Take 1 tablet (100 mg total) by mouth at bedtime as needed for sleep. 30 tablet 2   No current facility-administered medications for this visit.     Allergies:   Coconut flavor; Ibuprofen; Prednisone; Cyclobenzaprine; and Morphine   Social History:  The patient  reports that she has been smoking Cigarettes.  She has a 7.25 pack-year smoking  history. She has never used smokeless tobacco. She reports that she does not drink alcohol or use drugs.   Family History:  The patient's family history includes Cancer in her daughter, maternal aunt, maternal grandfather, and paternal aunt; Heart disease in her father and mother; Hyperlipidemia in her father and mother; Hypertension in  her brother, father, maternal grandfather, maternal grandmother, mother, paternal grandfather, paternal grandmother, and sister; Stroke in her father.    ROS:  Please see the history of present illness.   Otherwise, review of systems is positive for appetite change, constipation, depression, anxiety, back pain, balance problems.   All other systems are reviewed and negative.    PHYSICAL EXAM: VS:  BP 108/70   Pulse 60   Ht _0  (1.575 m)   Wt 153 lb 12.8 oz (69.8 kg)   BMI 28.13 kg/m  , BMI Body mass index is 28.13 kg/m. GEN: Well nourished, well developed, in no acute distress  HEENT: normal  Neck: no JVD, carotid bruits, or masses Cardiac: RRR; no murmurs, rubs, or gallops,no edema  Respiratory:  clear to auscultation bilaterally, normal work of breathing GI: soft, nontender, nondistended, + BS MS: no deformity or atrophy  Skin: warm and dry Neuro:  Strength and sensation are intact Psych: euthymic mood, full affect  EKG:  EKG is ordered today. Aug. 31, 2018:   NSR at 61.   Previous Inf. MI   Recent Labs: 09/29/2016: BUN 13; Creatinine, Ser 0.86; Potassium 4.2; Sodium 140; TSH 1.67 10/05/2016: ALT 8; Hemoglobin 16.0; Platelets 248    Lipid Panel     Component Value Date/Time   CHOL 143 10/05/2016 0953   TRIG 158 (H) 10/05/2016 0953   HDL 38 (L) 10/05/2016 0953   CHOLHDL 3.8 10/05/2016 0953   CHOLHDL 3.8 03/13/2014 1526   VLDL 34 03/13/2014 1526   LDLCALC 73 10/05/2016 0953     Wt Readings from Last 3 Encounters:  12/29/16 153 lb 12.8 oz (69.8 kg)  11/22/16 153 lb 3.2 oz (69.5 kg)  10/05/16 154 lb (69.9 kg)      Other studies  Reviewed: Additional studies/ records that were reviewed today include: Epic notes  ASSESSMENT AND PLAN:  1.  CAD: Currently no symptoms of coronary disease.  Has not been exercising  Encouraged her exercise more She has lost weight and her HbA1c is much better   2. Hyperlipidemia: On Crestor.  Check lipids today   3.   Superficial phlebitis / hemangioma   Is on ASA 81 BID. She does not want to reduce her dose. Managed by Dr. [name].   Has a birthmark that is very vascular on her right leg .    I will see her in  1 year    Current medicines are reviewed at length with the patient today.   The patient does not have concerns regarding her medicines.  The following changes were made today:  none  Labs/ tests ordered today include:  No orders of the defined types were placed in this encounter.     Mertie Moores, MD  12/29/2016 8:34 AM    [address],  [address] [place], [place]  [address] Pager [phone] Phone: [phone]; Fax: [phone]

## 2017-01-03 ENCOUNTER — Encounter: Payer: Self-pay | Admitting: Cardiovascular Disease

## 2017-01-03 ENCOUNTER — Telehealth: Payer: Self-pay | Admitting: Nurse Practitioner

## 2017-01-03 DIAGNOSIS — E781 Pure hyperglyceridemia: Secondary | ICD-10-CM

## 2017-01-03 DIAGNOSIS — E782 Mixed hyperlipidemia: Secondary | ICD-10-CM

## 2017-01-03 MED ORDER — FENOFIBRATE 145 MG PO TABS: 145.0000 mg | ORAL_TABLET | Freq: Every day | ORAL | 3 refills | Status: DC

## 2017-01-03 MED ORDER — FENOFIBRATE 145 MG PO TABS
145.0000 mg | ORAL_TABLET | Freq: Every day | ORAL | 3 refills | Status: DC
Start: 2017-01-03 — End: 2018-01-14

## 2017-01-03 NOTE — Telephone Encounter (Signed)
Reviewed lab results and plan of care with patient who verbalized understanding and agreement. She is scheduled for repeat lab work on December 5.  I advised her to call sooner with any questions or concerns. She thanked me for the call.

## 2017-01-03 NOTE — Telephone Encounter (Signed)
-----   Message from Thayer Headings, MD sent at 12/29/2016  4:22 PM EDT ----- Add fenofibrate 145 mg a day  Repeat labs in 3 months

## 2017-01-04 DIAGNOSIS — M25611 Stiffness of right shoulder, not elsewhere classified: Secondary | ICD-10-CM | POA: Diagnosis not present

## 2017-01-04 DIAGNOSIS — M25511 Pain in right shoulder: Secondary | ICD-10-CM | POA: Diagnosis not present

## 2017-01-18 ENCOUNTER — Encounter: Payer: Self-pay | Admitting: Family Medicine

## 2017-01-18 ENCOUNTER — Ambulatory Visit (INDEPENDENT_AMBULATORY_CARE_PROVIDER_SITE_OTHER): Payer: Medicare HMO | Admitting: Family Medicine

## 2017-01-18 VITALS — BP 106/67 | HR 72 | Temp 98.1°F | Resp 18 | Ht 62.0 in | Wt 151.2 lb

## 2017-01-18 DIAGNOSIS — R062 Wheezing: Secondary | ICD-10-CM | POA: Diagnosis not present

## 2017-01-18 DIAGNOSIS — J209 Acute bronchitis, unspecified: Secondary | ICD-10-CM

## 2017-01-18 DIAGNOSIS — Z9889 Other specified postprocedural states: Secondary | ICD-10-CM | POA: Diagnosis not present

## 2017-01-18 DIAGNOSIS — M47816 Spondylosis without myelopathy or radiculopathy, lumbar region: Secondary | ICD-10-CM | POA: Diagnosis not present

## 2017-01-18 MED ORDER — IPRATROPIUM-ALBUTEROL 20-100 MCG/ACT IN AERS: 1.0000 | INHALATION_SPRAY | Freq: Four times a day (QID) | RESPIRATORY_TRACT | 1 refills | Status: DC

## 2017-01-18 MED ORDER — DOXYCYCLINE MONOHYDRATE 100 MG PO TABS: 100.0000 mg | ORAL_TABLET | Freq: Two times a day (BID) | ORAL | 0 refills | Status: DC

## 2017-01-18 MED ORDER — ALBUTEROL SULFATE (2.5 MG/3ML) 0.083% IN NEBU
2.5000 mg | INHALATION_SOLUTION | RESPIRATORY_TRACT | Status: AC
Start: 2017-01-18 — End: 2017-01-18
  Administered 2017-01-18: 2.5 mg via RESPIRATORY_TRACT

## 2017-01-18 NOTE — Progress Notes (Signed)
9/20/20184:22 PM  [place] 01/23/1961, 56 y.o. female [id number]  Chief Complaint  Patient presents with  . Nasal Congestion    x3weeks  . Cough  . Fever    had a fever last week     HPI:   Patient is a 56 y.o. female with past medical history significant for smoking, DM2, MI who presents today for 3 weeks of productive cough, nasal congestion, fever that have since resolved, chest tightness and wheezing. Ran out of her combivent over a month ago. Cont to smoke, contemplative about quiting.  Depression screen [facility] 2/9 01/18/2017 11/22/2016 10/05/2016  Decreased Interest 0 1 0  Down, Depressed, Hopeless 0 0 0  PHQ - 2 Score 0 1 0  Altered sleeping - - -  Tired, decreased energy - - -  Change in appetite - - -  Feeling bad or failure about yourself  - - -  Trouble concentrating - - -  Moving slowly or fidgety/restless - - -  Suicidal thoughts - - -  PHQ-9 Score - - -    Allergies  Allergen Reactions  . Coconut Flavor Swelling and Rash  . Ibuprofen Swelling and Rash  . Prednisone Swelling and Rash  . Cyclobenzaprine   . Morphine Nausea And Vomiting    Current Outpatient Prescriptions on File Prior to Visit  Medication Sig Dispense Refill  . ACCU-CHEK FASTCLIX LANCETS MISC Use to check blood sugar 3 times daily. 306 each 1  . aspirin EC 81 MG tablet Take 81 mg by mouth 2 (two) times daily.    . Blood Glucose Monitoring Suppl (ACCU-CHEK GUIDE) w/Device KIT 1 each by Does not apply route 3 (three) times daily. 1 kit 1  . bromocriptine (PARLODEL) 2.5 MG tablet Take 0.5 tablets (1.25 mg total) by mouth daily. 45 tablet 1  . carvedilol (COREG) 3.125 MG tablet TAKE 1 TABLET (3.125 MG TOTAL) BY MOUTH 2 (TWO) TIMES DAILY. 180 tablet 3  . diazepam (VALIUM) 5 MG tablet Take 1-2 tablets (5-10 mg total) by mouth every 8 (eight) hours as needed for anxiety or muscle spasms. Make sure to separate this by 4 hours from any narcotic such as the oxycodone. 60 tablet 0  .  DULoxetine (CYMBALTA) 60 MG capsule Take 60 mg by mouth 2 (two) times daily.    . empagliflozin (JARDIANCE) 10 MG TABS tablet Take 10 mg by mouth daily. 30 tablet 11  . fenofibrate (TRICOR) 145 MG tablet Take 1 tablet (145 mg total) by mouth daily. 90 tablet 3  . gabapentin (NEURONTIN) 800 MG tablet Take 1 tablet (800 mg total) by mouth 4 (four) times daily. 120 tablet 0  . glucose blood (ACCU-CHEK GUIDE) test strip Use to check blood sugar 3 times daily. 300 each 1  . levothyroxine (SYNTHROID, LEVOTHROID) 100 MCG tablet Take 1 tablet (100 mcg total) by mouth daily before breakfast. 30 tablet 11  . metFORMIN (GLUCOPHAGE) 1000 MG tablet Take 1 tablet (1,000 mg total) by mouth daily with breakfast. 90 tablet 1  . Methylnaltrexone Bromide 150 MG TABS Take 3 tablets by mouth daily. 90 tablet 2  . omeprazole (PRILOSEC) 40 MG capsule Take 1 capsule (40 mg total) by mouth daily. 30 minutes before a meal 90 capsule 3  . oxyCODONE-acetaminophen (PERCOCET) 10-325 MG tablet Take 1 tablet by mouth every 4 (four) hours as needed for pain.    Marland Kitchen QUEtiapine (SEROQUEL) 200 MG tablet Take 200 mg by mouth 3 (three) times daily.    Marland Kitchen  ranitidine (ZANTAC) 150 MG tablet Take 1 tablet (150 mg total) by mouth 2 (two) times daily. 180 tablet 3  . rosuvastatin (CRESTOR) 20 MG tablet Take 1 tablet (20 mg total) by mouth daily. 90 tablet 3  . sitaGLIPtin (JANUVIA) 100 MG tablet Take 1 tablet (100 mg total) by mouth daily. 90 tablet 3  . traZODone (DESYREL) 100 MG tablet Take 1 tablet (100 mg total) by mouth at bedtime as needed for sleep. 30 tablet 2   No current facility-administered medications on file prior to visit.     Past Medical History:  Diagnosis Date  . Allergy   . Anxiety   . Arthritis   . Back pain, chronic   . Bipolar disorder ([place])   . Bronchitis    hx-no inhalers at home  . Carotid bruit   . Carpal tunnel syndrome on both sides   . Coronary artery disease   . DDD (degenerative disc disease), lumbar    . Depression   . Diabetes mellitus    diet controlled  . Ejection fraction    .  Marland Kitchen GERD (gastroesophageal reflux disease)   . Hx of CABG   . Hypercholesteremia   . Hypertension   . Hypothyroidism   . Multiple thyroid nodules   . Myocardial infarction ([place]) 11/20/06  . Neuromuscular disorder ([place])   . Nodule of neck     Past Surgical History:  Procedure Laterality Date  . ABDOMINAL HYSTERECTOMY  1984  . BREAST EXCISIONAL BIOPSY Right   . BREAST SURGERY     rt br cystx2  . CORONARY ARTERY BYPASS GRAFT  2008  . KNEE SURGERY  2009   lt   . ORIF ANKLE FRACTURE Right 07/12/2012   Procedure: OPEN REDUCTION INTERNAL FIXATION (ORIF) RIGHT ANKLE FRACTURE;  Surgeon: Alta Corning, MD;  Location: [facility];  Service: Orthopedics;  Laterality: Right;  . SHOULDER SURGERY  11/08/2016  . TUBAL LIGATION    . WRIST SURGERY Left     Social History  Substance Use Topics  . Smoking status: Current Every Day Smoker    Packs/day: 0.25    Years: 29.00    Types: Cigarettes  . Smokeless tobacco: Never Used  . Alcohol use No    Family History  Problem Relation Age of Onset  . Heart disease Mother   . Hyperlipidemia Mother   . Hypertension Mother   . Heart disease Father   . Stroke Father   . Hypertension Father   . Hyperlipidemia Father   . Hypertension Sister   . Hypertension Brother   . Cancer Daughter        unknown  . Hypertension Maternal Grandmother   . Hypertension Maternal Grandfather   . Cancer Maternal Grandfather   . Hypertension Paternal Grandmother   . Hypertension Paternal Grandfather   . Cancer Maternal Aunt        breast and ovarian  . Cancer Paternal Aunt        breast  . Colon cancer Neg Hx     ROS Per HPI  OBJECTIVE:  Blood pressure 106/67, pulse 72, temperature 98.1 F (36.7 C), temperature source Oral, resp. rate 18, height 5' 2"  (1.575 m), weight 151 lb 3.2 oz (68.6 kg), SpO2 93 %.  Physical Exam  Constitutional: She is oriented  to person, place, and time and well-developed, well-nourished, and in no distress.  HENT:  Head: Normocephalic and atraumatic.  Right Ear: Hearing, tympanic membrane, external ear and ear canal normal.  Left Ear: Hearing, tympanic membrane, external ear and ear canal normal.  Mouth/Throat: Oropharynx is clear and moist.  Eyes: Pupils are equal, round, and reactive to light. EOM are normal.  Neck: Neck supple.  Cardiovascular: Normal rate, regular rhythm and normal heart sounds.  Exam reveals no gallop and no friction rub.   No murmur heard. Pulmonary/Chest: Effort normal. She has wheezes. She has rales (RLQ).  Lymphadenopathy:    She has no cervical adenopathy.  Neurological: She is alert and oriented to person, place, and time. Gait normal.  Skin: Skin is warm and dry.      ASSESSMENT and PLAN:  1. Acute bronchitis, unspecified organism Responded well to albuterol in clinic. Rx for doxycycline and combivent to pharmacy of choice. Discussed supportive measures and RTC precautions. Strongly encouraged her to quit smoking.  2. Wheezing  - albuterol (PROVENTIL) (2.5 MG/3ML) 0.083% nebulizer solution 2.5 mg; Take 3 mLs (2.5 mg total) by nebulization now.       Rutherford Guys, MD [facility] [place], [place] [address] Ph.  [phone] Fax [phone]

## 2017-01-18 NOTE — Patient Instructions (Signed)
     IF you received an x-ray today, you will receive an invoice from [facility]. Please contact [facility] at [phone] with questions or concerns regarding your invoice.   IF you received labwork today, you will receive an invoice from [facility]. Please contact [facility] at [phone] with questions or concerns regarding your invoice.   Our billing staff will not be able to assist you with questions regarding bills from these companies.  You will be contacted with the lab results as soon as they are available. The fastest way to get your results is to activate your My Chart account. Instructions are located on the last page of this paperwork. If you have not heard from us regarding the results in 2 weeks, please contact this office.     

## 2017-01-25 DIAGNOSIS — M5441 Lumbago with sciatica, right side: Secondary | ICD-10-CM | POA: Diagnosis not present

## 2017-01-25 DIAGNOSIS — M961 Postlaminectomy syndrome, not elsewhere classified: Secondary | ICD-10-CM | POA: Diagnosis not present

## 2017-01-25 DIAGNOSIS — M4696 Unspecified inflammatory spondylopathy, lumbar region: Secondary | ICD-10-CM | POA: Diagnosis not present

## 2017-01-25 DIAGNOSIS — M533 Sacrococcygeal disorders, not elsewhere classified: Secondary | ICD-10-CM | POA: Diagnosis not present

## 2017-01-25 DIAGNOSIS — G894 Chronic pain syndrome: Secondary | ICD-10-CM | POA: Diagnosis not present

## 2017-01-29 ENCOUNTER — Ambulatory Visit (INDEPENDENT_AMBULATORY_CARE_PROVIDER_SITE_OTHER): Payer: Medicare HMO | Admitting: Endocrinology

## 2017-01-29 ENCOUNTER — Encounter: Payer: Self-pay | Admitting: Endocrinology

## 2017-01-29 VITALS — BP 112/62 | HR 72 | Wt 149.8 lb

## 2017-01-29 DIAGNOSIS — E119 Type 2 diabetes mellitus without complications: Secondary | ICD-10-CM

## 2017-01-29 LAB — POCT GLYCOSYLATED HEMOGLOBIN (HGB A1C): HEMOGLOBIN A1C: 6.1

## 2017-01-29 MED ORDER — ACARBOSE 25 MG PO TABS: 25.0000 mg | ORAL_TABLET | Freq: Three times a day (TID) | ORAL | 3 refills | Status: DC

## 2017-01-29 MED ORDER — LEVOTHYROXINE SODIUM 100 MCG PO TABS: 100.0000 ug | ORAL_TABLET | Freq: Every day | ORAL | 11 refills | Status: DC

## 2017-01-29 NOTE — Progress Notes (Signed)
Subjective:    Patient ID: [name], female    DOB: Oct 11, 1960, 56 y.o.   MRN: [id number]  HPI Pt has multinodular goiter (dx'ed 2014; it was found on [place], which had been done to investigate lymphadenopathy of the neck; the nodes appeared benign, but goiter was incidentally noted; bx was c/w benign follicular nodule; she was noted in early 2015 to have suppressed TSH, and she received RAI in April of 2015; in late 2015, she was noted to have elevated TSH, and was rx'ed synthroid).   She does not notice any nodule in the neck.   Pt also returns for f/u of diabetes mellitus:  DM type: 2 Dx'ed: 5625 Complications: polyneuropathy, PAD, and CAD.   Therapy: 4 oral meds GDM: never DKA: never Severe hypoglycemia: never Pancreatitis: never Other: She has never been on insulin; she did not tolerate invokana (nausea).   Interval history: pt states she feels well in general, except for insomnia.  last steroid injection was 3 weeks ago.  no cbg record, but states cbg's are well-controlled.   Past Medical History:  Diagnosis Date  . Allergy   . Anxiety   . Arthritis   . Back pain, chronic   . Bipolar disorder ([place])   . Bronchitis    hx-no inhalers at home  . Carotid bruit   . Carpal tunnel syndrome on both sides   . Coronary artery disease   . DDD (degenerative disc disease), lumbar   . Depression   . Diabetes mellitus    diet controlled  . Ejection fraction    .  Marland Kitchen GERD (gastroesophageal reflux disease)   . Hx of CABG   . Hypercholesteremia   . Hypertension   . Hypothyroidism   . Multiple thyroid nodules   . Myocardial infarction ([place]) 11/20/06  . Neuromuscular disorder ([place])   . Nodule of neck     Past Surgical History:  Procedure Laterality Date  . ABDOMINAL HYSTERECTOMY  1984  . BREAST EXCISIONAL BIOPSY Right   . BREAST SURGERY     rt br cystx2  . CORONARY ARTERY BYPASS GRAFT  2008  . KNEE SURGERY  2009   lt   . ORIF ANKLE FRACTURE Right 07/12/2012   Procedure: OPEN REDUCTION INTERNAL FIXATION (ORIF) RIGHT ANKLE FRACTURE;  Surgeon: Alta Corning, MD;  Location: [facility];  Service: Orthopedics;  Laterality: Right;  . SHOULDER SURGERY  11/08/2016  . TUBAL LIGATION    . WRIST SURGERY Left     Social History   Social History  . Marital status: Married    Spouse name: N/A  . Number of children: N/A  . Years of education: N/A   Occupational History  . Not on file.   Social History Main Topics  . Smoking status: Current Every Day Smoker    Packs/day: 0.25    Years: 29.00    Types: Cigarettes  . Smokeless tobacco: Never Used  . Alcohol use No  . Drug use: No  . Sexual activity: Not on file   Other Topics Concern  . Not on file   Social History Narrative  . No narrative on file    Current Outpatient Prescriptions on File Prior to Visit  Medication Sig Dispense Refill  . ACCU-CHEK FASTCLIX LANCETS MISC Use to check blood sugar 3 times daily. 306 each 1  . aspirin EC 81 MG tablet Take 81 mg by mouth 2 (two) times daily.    . Blood Glucose Monitoring Suppl (  ACCU-CHEK GUIDE) w/Device KIT 1 each by Does not apply route 3 (three) times daily. 1 kit 1  . bromocriptine (PARLODEL) 2.5 MG tablet Take 0.5 tablets (1.25 mg total) by mouth daily. 45 tablet 1  . carvedilol (COREG) 3.125 MG tablet TAKE 1 TABLET (3.125 MG TOTAL) BY MOUTH 2 (TWO) TIMES DAILY. 180 tablet 3  . diazepam (VALIUM) 5 MG tablet Take 1-2 tablets (5-10 mg total) by mouth every 8 (eight) hours as needed for anxiety or muscle spasms. Make sure to separate this by 4 hours from any narcotic such as the oxycodone. 60 tablet 0  . doxycycline (ADOXA) 100 MG tablet Take 1 tablet (100 mg total) by mouth 2 (two) times daily. 14 tablet 0  . DULoxetine (CYMBALTA) 60 MG capsule Take 60 mg by mouth 2 (two) times daily.    . empagliflozin (JARDIANCE) 10 MG TABS tablet Take 10 mg by mouth daily. 30 tablet 11  . fenofibrate (TRICOR) 145 MG tablet Take 1 tablet (145 mg  total) by mouth daily. 90 tablet 3  . gabapentin (NEURONTIN) 800 MG tablet Take 1 tablet (800 mg total) by mouth 4 (four) times daily. 120 tablet 0  . glucose blood (ACCU-CHEK GUIDE) test strip Use to check blood sugar 3 times daily. 300 each 1  . Ipratropium-Albuterol (COMBIVENT RESPIMAT) 20-100 MCG/ACT AERS respimat Inhale 1 puff into the lungs every 6 (six) hours. 4 g 1  . metFORMIN (GLUCOPHAGE) 1000 MG tablet Take 1 tablet (1,000 mg total) by mouth daily with breakfast. 90 tablet 1  . Methylnaltrexone Bromide 150 MG TABS Take 3 tablets by mouth daily. 90 tablet 2  . omeprazole (PRILOSEC) 40 MG capsule Take 1 capsule (40 mg total) by mouth daily. 30 minutes before a meal 90 capsule 3  . oxyCODONE-acetaminophen (PERCOCET) 10-325 MG tablet Take 1 tablet by mouth every 4 (four) hours as needed for pain.    Marland Kitchen QUEtiapine (SEROQUEL) 200 MG tablet Take 200 mg by mouth 3 (three) times daily.    . ranitidine (ZANTAC) 150 MG tablet Take 1 tablet (150 mg total) by mouth 2 (two) times daily. 180 tablet 3  . rosuvastatin (CRESTOR) 20 MG tablet Take 1 tablet (20 mg total) by mouth daily. 90 tablet 3  . traZODone (DESYREL) 100 MG tablet Take 1 tablet (100 mg total) by mouth at bedtime as needed for sleep. 30 tablet 2   No current facility-administered medications on file prior to visit.     Allergies  Allergen Reactions  . Coconut Flavor Swelling and Rash  . Ibuprofen Swelling and Rash  . Prednisone Swelling and Rash  . Cyclobenzaprine   . Morphine Nausea And Vomiting    Family History  Problem Relation Age of Onset  . Heart disease Mother   . Hyperlipidemia Mother   . Hypertension Mother   . Heart disease Father   . Stroke Father   . Hypertension Father   . Hyperlipidemia Father   . Hypertension Sister   . Hypertension Brother   . Cancer Daughter        unknown  . Hypertension Maternal Grandmother   . Hypertension Maternal Grandfather   . Cancer Maternal Grandfather   . Hypertension  Paternal Grandmother   . Hypertension Paternal Grandfather   . Cancer Maternal Aunt        breast and ovarian  . Cancer Paternal Aunt        breast  . Colon cancer Neg Hx     BP 112/62  Pulse 72   Wt 149 lb 12.8 oz (67.9 kg)   SpO2 96%   BMI 27.40 kg/m    Review of Systems She has lost weight, due to her efforts.  She has severe constipation.     Objective:   Physical Exam VITAL SIGNS:  See vs page GENERAL: no distress Pulses: dorsalis pedis intact bilat.   MSK: no deformity of the feet CV: no leg edema.  Skin:  no ulcer on the feet.  normal temp on the feet.  Large port-wine area on the right leg and foot. Neuro: sensation is intact to touch on the feet, but decreased from normal.    A1c=6.1%     Assessment & Plan:  Type 2 DM: well-controlled Constipation: new to me.  Patient Instructions  check your blood sugar once a day.  vary the time of day when you check, between before the 3 meals, and at bedtime.  also check if you have symptoms of your blood sugar being too high or too low.  please keep a record of the readings and bring it to your next appointment here (or you can bring the meter itself).  You can write it on any piece of paper.  please call us sooner if your blood sugar goes below 70, or if you have a lot of readings over 200. I have sent a prescription to your pharmacy, to change januvia to "acarbose."  This helps the bowels move.  You don't need the follow-up thyroid ultrasound yet.   Please come back for a follow-up appointment in 4 months.

## 2017-01-29 NOTE — Patient Instructions (Addendum)
check your blood sugar once a day.  vary the time of day when you check, between before the 3 meals, and at bedtime.  also check if you have symptoms of your blood sugar being too high or too low.  please keep a record of the readings and bring it to your next appointment here (or you can bring the meter itself).  You can write it on any piece of paper.  please call us sooner if your blood sugar goes below 70, or if you have a lot of readings over 200. I have sent a prescription to your pharmacy, to change januvia to "acarbose."  This helps the bowels move.  You don't need the follow-up thyroid ultrasound yet.   Please come back for a follow-up appointment in 4 months.

## 2017-02-20 ENCOUNTER — Encounter: Payer: Self-pay | Admitting: Endocrinology

## 2017-02-20 ENCOUNTER — Encounter: Payer: Self-pay | Admitting: Family Medicine

## 2017-02-20 ENCOUNTER — Other Ambulatory Visit: Payer: Self-pay | Admitting: Endocrinology

## 2017-02-21 MED ORDER — ZOSTER VAC RECOMB ADJUVANTED 50 MCG/0.5ML IM SUSR: 0.5000 mL | Freq: Once | INTRAMUSCULAR | 1 refills | Status: AC

## 2017-02-22 ENCOUNTER — Ambulatory Visit: Payer: Medicare HMO | Admitting: Dietician

## 2017-02-27 ENCOUNTER — Other Ambulatory Visit: Payer: Self-pay | Admitting: Family Medicine

## 2017-02-27 ENCOUNTER — Encounter: Payer: Self-pay | Admitting: Family Medicine

## 2017-02-27 ENCOUNTER — Encounter: Payer: Self-pay | Admitting: Endocrinology

## 2017-03-02 DIAGNOSIS — F319 Bipolar disorder, unspecified: Secondary | ICD-10-CM | POA: Diagnosis not present

## 2017-03-05 DIAGNOSIS — M47816 Spondylosis without myelopathy or radiculopathy, lumbar region: Secondary | ICD-10-CM | POA: Diagnosis not present

## 2017-03-05 DIAGNOSIS — M961 Postlaminectomy syndrome, not elsewhere classified: Secondary | ICD-10-CM | POA: Diagnosis not present

## 2017-03-05 DIAGNOSIS — M533 Sacrococcygeal disorders, not elsewhere classified: Secondary | ICD-10-CM | POA: Diagnosis not present

## 2017-03-05 DIAGNOSIS — G8929 Other chronic pain: Secondary | ICD-10-CM | POA: Diagnosis not present

## 2017-03-05 DIAGNOSIS — M5441 Lumbago with sciatica, right side: Secondary | ICD-10-CM | POA: Diagnosis not present

## 2017-03-13 ENCOUNTER — Encounter: Payer: Self-pay | Admitting: Family Medicine

## 2017-03-16 ENCOUNTER — Other Ambulatory Visit: Payer: Self-pay

## 2017-03-16 ENCOUNTER — Other Ambulatory Visit: Payer: Self-pay | Admitting: Family Medicine

## 2017-03-16 MED ORDER — METFORMIN HCL 1000 MG PO TABS: 1000.0000 mg | ORAL_TABLET | Freq: Every day | ORAL | 1 refills | Status: DC

## 2017-03-19 MED ORDER — GABAPENTIN 800 MG PO TABS: 800.0000 mg | ORAL_TABLET | Freq: Four times a day (QID) | ORAL | 0 refills | Status: DC

## 2017-03-19 NOTE — Telephone Encounter (Signed)
Dr. Brigitte Pulse last time this was filled was in 10/2016.  Please Advise Patient has appointment scheduled for 05/10/2017

## 2017-03-21 ENCOUNTER — Ambulatory Visit: Payer: Medicare HMO | Admitting: Family Medicine

## 2017-03-24 ENCOUNTER — Ambulatory Visit: Payer: Medicare HMO | Admitting: Family Medicine

## 2017-03-24 ENCOUNTER — Encounter: Payer: Self-pay | Admitting: Family Medicine

## 2017-03-24 ENCOUNTER — Other Ambulatory Visit: Payer: Self-pay

## 2017-03-24 VITALS — BP 116/72 | HR 65 | Temp 98.0°F | Resp 16 | Ht 62.6 in | Wt 153.0 lb

## 2017-03-24 DIAGNOSIS — J209 Acute bronchitis, unspecified: Secondary | ICD-10-CM

## 2017-03-24 DIAGNOSIS — J42 Unspecified chronic bronchitis: Secondary | ICD-10-CM | POA: Diagnosis not present

## 2017-03-24 DIAGNOSIS — F17200 Nicotine dependence, unspecified, uncomplicated: Secondary | ICD-10-CM | POA: Diagnosis not present

## 2017-03-24 MED ORDER — AZITHROMYCIN 250 MG PO TABS: ORAL_TABLET | ORAL | 0 refills | Status: DC

## 2017-03-24 MED ORDER — ALBUTEROL SULFATE (2.5 MG/3ML) 0.083% IN NEBU: 2.5000 mg | INHALATION_SOLUTION | Freq: Once | RESPIRATORY_TRACT | Status: DC

## 2017-03-24 MED ORDER — GABAPENTIN 800 MG PO TABS: 800.0000 mg | ORAL_TABLET | Freq: Four times a day (QID) | ORAL | 1 refills | Status: DC

## 2017-03-24 MED ORDER — FLUTICASONE PROPIONATE 50 MCG/ACT NA SUSP: 2.0000 | Freq: Every day | NASAL | 2 refills | Status: DC

## 2017-03-24 NOTE — Patient Instructions (Addendum)
     IF you received an x-ray today, you will receive an invoice from [facility]. Please contact [facility] at [phone] with questions or concerns regarding your invoice.   IF you received labwork today, you will receive an invoice from [facility]. Please contact [facility] at [phone] with questions or concerns regarding your invoice.   Our billing staff will not be able to assist you with questions regarding bills from these companies.  You will be contacted with the lab results as soon as they are available. The fastest way to get your results is to activate your My Chart account. Instructions are located on the last page of this paperwork. If you have not heard from us regarding the results in 2 weeks, please contact this office.     Upper Respiratory Infection, Adult Most upper respiratory infections (URIs) are caused by a virus. A URI affects the nose, throat, and upper air passages. The most common type of URI is often called "the common cold." Follow these instructions at home:  Take medicines only as told by your doctor.  Gargle warm saltwater or take cough drops to comfort your throat as told by your doctor.  Use a warm mist humidifier or inhale steam from a shower to increase air moisture. This may make it easier to breathe.  Drink enough fluid to keep your pee (urine) clear or pale yellow.  Eat soups and other clear broths.  Have a healthy diet.  Rest as needed.  Go back to work when your fever is gone or your doctor says it is okay. ? You may need to stay home longer to avoid giving your URI to others. ? You can also wear a face mask and wash your hands often to prevent spread of the virus.  Use your inhaler more if you have asthma.  Do not use any tobacco products, including cigarettes, chewing tobacco, or electronic cigarettes. If you need help quitting, ask your doctor. Contact a doctor if:  You are getting worse, not better.  Your  symptoms are not helped by medicine.  You have chills.  You are getting more short of breath.  You have brown or red mucus.  You have yellow or brown discharge from your nose.  You have pain in your face, especially when you bend forward.  You have a fever.  You have puffy (swollen) neck glands.  You have pain while swallowing.  You have white areas in the back of your throat. Get help right away if:  You have very bad or constant: ? Headache. ? Ear pain. ? Pain in your forehead, behind your eyes, and over your cheekbones (sinus pain). ? Chest pain.  You have long-lasting (chronic) lung disease and any of the following: ? Wheezing. ? Long-lasting cough. ? Coughing up blood. ? A change in your usual mucus.  You have a stiff neck.  You have changes in your: ? Vision. ? Hearing. ? Thinking. ? Mood. This information is not intended to replace advice given to you by your health care provider. Make sure you discuss any questions you have with your health care provider. Document Released: 10/04/2007 Document Revised: 12/19/2015 Document Reviewed: 07/23/2013 Elsevier Interactive Patient Education  2018 Elsevier Inc.  

## 2017-03-24 NOTE — Progress Notes (Signed)
Subjective:  By signing my name below, I, [name], attest that this documentation has been prepared under the direction and in the presence of [name], MD. Electronically Signed: [name], Roland. 03/24/2017 , 8:31 AM .  Patient was seen in Room 1 .   Patient ID: [name], female    DOB: 12-20-1960, 56 y.o.   MRN: [id number] Chief Complaint  Patient presents with  . Bronchitis    pt states she is here in the office for a lung test    HPI [name] is a 56 y.o. female who presents to Primary Care at [facility] complaining of raspy cough with nasal drainage that started about 1.5 weeks ago. She states worsening nasal congestion at night. She thought it was allergies, but she reports never taken any allergy medications. She hasn't been taking combivent this time around. She hasn't used any nasal sprays.   She is still smoking about the same amount; started smoking in her 50s. Most she smoked was about 8-9 cigarettes in a day but not for a long time. She tried to quit smoking last year, but lasted 30 days.   Patient was seen by a colleague 2 months ago for acute bronchitis, ill for 3 weeks at that time, that responded well to albuterol neb, as she was wheezing. She was treated with doxycycline and combivent at that time. Smoking cessation was encouraged. She does keep combivent at home, but no prior spirometry done. Her last chest xray was normal on October 19, 2015.   She received flu shot on Nov 5th at a pharmacy.   Past Medical History:  Diagnosis Date  . Allergy   . Anxiety   . Arthritis   . Back pain, chronic   . Bipolar disorder ([place])   . Bronchitis    hx-no inhalers at home  . Carotid bruit   . Carpal tunnel syndrome on both sides   . Coronary artery disease   . DDD (degenerative disc disease), lumbar   . Depression   . Diabetes mellitus    diet controlled  . Ejection fraction    .  Marland Kitchen GERD (gastroesophageal reflux disease)   . Hx of CABG    . Hypercholesteremia   . Hypertension   . Hypothyroidism   . Multiple thyroid nodules   . Myocardial infarction ([place]) 11/20/06  . Neuromuscular disorder ([place])   . Nodule of neck    Prior to Admission medications   Medication Sig Start Date End Date Taking? Authorizing Provider  acarbose (PRECOSE) 25 MG tablet Take 1 tablet (25 mg total) by mouth 3 (three) times daily with meals. 01/29/17   Renato Shin, MD  ACCU-CHEK FASTCLIX LANCETS MISC Use to check blood sugar 3 times daily. 08/15/16   Renato Shin, MD  aspirin EC 81 MG tablet Take 81 mg by mouth 2 (two) times daily.    [provider]  Blood Glucose Monitoring Suppl (ACCU-CHEK GUIDE) w/Device KIT 1 each by Does not apply route 3 (three) times daily. 08/15/16   Renato Shin, MD  bromocriptine (PARLODEL) 2.5 MG tablet TAKE 1/2 TABLET EVERY DAY 02/21/17   Renato Shin, MD  carvedilol (COREG) 3.125 MG tablet TAKE 1 TABLET (3.125 MG TOTAL) BY MOUTH 2 (TWO) TIMES DAILY. 10/05/16   Shawnee Knapp, MD  diazepam (VALIUM) 5 MG tablet Take 1-2 tablets (5-10 mg total) by mouth every 8 (eight) hours as needed for anxiety or muscle spasms. Make sure to separate this by 4  hours from any narcotic such as the oxycodone. 11/23/16   Shawnee Knapp, MD  doxycycline (ADOXA) 100 MG tablet Take 1 tablet (100 mg total) by mouth 2 (two) times daily. 01/18/17   Rutherford Guys, MD  DULoxetine (CYMBALTA) 60 MG capsule Take 60 mg by mouth 2 (two) times daily.    [provider]  empagliflozin (JARDIANCE) 10 MG TABS tablet Take 10 mg by mouth daily. 06/02/16   Renato Shin, MD  fenofibrate (TRICOR) 145 MG tablet Take 1 tablet (145 mg total) by mouth daily. 01/03/17   Nahser, Wonda Cheng, MD  gabapentin (NEURONTIN) 800 MG tablet Take 1 tablet (800 mg total) 4 (four) times daily by mouth. 03/19/17   Shawnee Knapp, MD  glucose blood (ACCU-CHEK GUIDE) test strip Use to check blood sugar 3 times daily. 08/15/16   Renato Shin, MD  Ipratropium-Albuterol (COMBIVENT  RESPIMAT) 20-100 MCG/ACT AERS respimat Inhale 1 puff into the lungs every 6 (six) hours. 01/18/17   Rutherford Guys, MD  levothyroxine (SYNTHROID, LEVOTHROID) 100 MCG tablet Take 1 tablet (100 mcg total) by mouth daily before breakfast. 01/29/17   Renato Shin, MD  metFORMIN (GLUCOPHAGE) 1000 MG tablet Take 1 tablet (1,000 mg total) daily with breakfast by mouth. 03/16/17   Renato Shin, MD  Methylnaltrexone Bromide 150 MG TABS Take 3 tablets by mouth daily. 11/24/16   Shawnee Knapp, MD  omeprazole (PRILOSEC) 40 MG capsule Take 1 capsule (40 mg total) by mouth daily. 30 minutes before a meal 02/16/16   Shawnee Knapp, MD  oxyCODONE-acetaminophen (PERCOCET) 10-325 MG tablet Take 1 tablet by mouth every 4 (four) hours as needed for pain.    [provider]  QUEtiapine (SEROQUEL) 200 MG tablet Take 200 mg by mouth 3 (three) times daily.    [provider]  ranitidine (ZANTAC) 150 MG tablet Take 1 tablet (150 mg total) by mouth 2 (two) times daily. 02/16/16   Shawnee Knapp, MD  rosuvastatin (CRESTOR) 20 MG tablet TAKE 1 TABLET (20 MG TOTAL) BY MOUTH DAILY. 02/28/17   Shawnee Knapp, MD  traZODone (DESYREL) 100 MG tablet Take 1 tablet (100 mg total) by mouth at bedtime as needed for sleep. 11/24/16   Shawnee Knapp, MD   Allergies  Allergen Reactions  . Coconut Flavor Swelling and Rash  . Ibuprofen Swelling and Rash  . Prednisone Swelling and Rash  . Cyclobenzaprine   . Morphine Nausea And Vomiting   Past Surgical History:  Procedure Laterality Date  . ABDOMINAL HYSTERECTOMY  1984  . BREAST EXCISIONAL BIOPSY Right   . BREAST SURGERY     rt br cystx2  . CORONARY ARTERY BYPASS GRAFT  2008  . KNEE SURGERY  2009   lt   . ORIF ANKLE FRACTURE Right 07/12/2012   Procedure: OPEN REDUCTION INTERNAL FIXATION (ORIF) RIGHT ANKLE FRACTURE;  Surgeon: Alta Corning, MD;  Location: [facility];  Service: Orthopedics;  Laterality: Right;  . SHOULDER SURGERY  11/08/2016  . TUBAL LIGATION      . WRIST SURGERY Left    Family History  Problem Relation Age of Onset  . Heart disease Mother   . Hyperlipidemia Mother   . Hypertension Mother   . Heart disease Father   . Stroke Father   . Hypertension Father   . Hyperlipidemia Father   . Hypertension Sister   . Hypertension Brother   . Cancer Daughter        unknown  .  Hypertension Maternal Grandmother   . Hypertension Maternal Grandfather   . Cancer Maternal Grandfather   . Hypertension Paternal Grandmother   . Hypertension Paternal Grandfather   . Cancer Maternal Aunt        breast and ovarian  . Cancer Paternal Aunt        breast  . Colon cancer Neg Hx    Social History   Tobacco Use  . Smoking status: Current Every Day Smoker    Packs/day: 0.25    Years: 29.00    Pack years: 7.25    Types: Cigarettes  . Smokeless tobacco: Never Used  Substance Use Topics  . Alcohol use: No  . Drug use: No   Depression screen [facility] 2/9 03/24/2017 01/18/2017 11/22/2016 10/05/2016 05/09/2016  Decreased Interest 0 0 1 0 0  Down, Depressed, Hopeless 0 0 0 0 0  PHQ - 2 Score 0 0 1 0 0  Altered sleeping - - - - -  Tired, decreased energy - - - - -  Change in appetite - - - - -  Feeling bad or failure about yourself  - - - - -  Trouble concentrating - - - - -  Moving slowly or fidgety/restless - - - - -  Suicidal thoughts - - - - -  PHQ-9 Score - - - - -    Review of Systems  Constitutional: Negative for chills, fatigue, fever and unexpected weight change.  HENT: Positive for congestion.   Respiratory: Positive for cough.   Gastrointestinal: Negative for constipation, diarrhea, nausea and vomiting.  Skin: Negative for rash and wound.  Neurological: Negative for dizziness, weakness and headaches.       Objective:   Physical Exam  Constitutional: She is oriented to person, place, and time. She appears well-developed and well-nourished. No distress.  HENT:  Head: Normocephalic and atraumatic.  Right Ear: Tympanic membrane and  ear canal normal.  Left Ear: Tympanic membrane and ear canal normal.  Nose: Rhinorrhea present.  Mouth/Throat: Posterior oropharyngeal erythema present.  Nares: erythema and rhinitis Oropharynx: positive postnasal drip  Eyes: EOM are normal. Pupils are equal, round, and reactive to light.  Neck: Neck supple.  Cardiovascular: Normal rate.  Pulmonary/Chest: Effort normal. No respiratory distress.  Musculoskeletal: Normal range of motion.  Lymphadenopathy:    She has cervical adenopathy (anterior).  Neurological: She is alert and oriented to person, place, and time.  Skin: Skin is warm and dry.  Psychiatric: She has a normal mood and affect. Her behavior is normal.  Nursing note and vitals reviewed.   BP 116/72   Pulse 65   Temp 98 F (36.7 C) (Oral)   Resp 16   Ht 5' 2.6" (1.59 m)   Wt 153 lb (69.4 kg)   SpO2 94%   BMI 27.45 kg/m            Assessment & Plan:   1. Tobacco use disorder - has cut down - refer for screening lung CT  2. Chronic bronchitis with acute exacerbation ([place]) - does have a mild broncho dialator response. PFTs  Borderline but do not actually qualify for dx of obstructive airway dz.    Orders Placed This Encounter  Procedures  . Ambulatory Referral for Lung Cancer Scre    Referral Priority:   Routine    Referral Type:   Consultation    Referral Reason:   Specialty Services Required    Number of Visits Requested:   1  . Care order/instruction:  Scheduling Instructions:     Spirometry IF NEB IS ORDERED PLEASE DO A SPIROMETRY BEFORE AND AFTER.    Meds ordered this encounter  Medications  . albuterol (PROVENTIL) (2.5 MG/3ML) 0.083% nebulizer solution 2.5 mg  . DISCONTD: gabapentin (NEURONTIN) 800 MG tablet    Sig: Take 1 tablet (800 mg total) by mouth 4 (four) times daily.    Dispense:  360 tablet    Refill:  1    Send to [facility] [phone]  . gabapentin (NEURONTIN) 800 MG tablet    Sig: Take 1 tablet (800 mg total) by  mouth 4 (four) times daily.    Dispense:  360 tablet    Refill:  1    Send to [facility] [phone]  . azithromycin (ZITHROMAX) 250 MG tablet    Sig: Take 2 tabs PO x 1 dose, then 1 tab PO QD x 4 days    Dispense:  6 tablet    Refill:  0  . fluticasone (FLONASE) 50 MCG/ACT nasal spray    Sig: Place 2 sprays into both nostrils at bedtime.    Dispense:  16 g    Refill:  2    I personally performed the services described in this documentation, which was scribed in my presence. The recorded information has been reviewed and considered, and addended by me as needed.   [name], M.D.  Primary Care at [facility] [address] [place], [place] [address] [phone] phone [phone] fax  03/27/17 8:19 AM

## 2017-03-26 ENCOUNTER — Other Ambulatory Visit: Payer: Self-pay | Admitting: Acute Care

## 2017-03-26 DIAGNOSIS — F17210 Nicotine dependence, cigarettes, uncomplicated: Secondary | ICD-10-CM

## 2017-03-26 DIAGNOSIS — Z122 Encounter for screening for malignant neoplasm of respiratory organs: Secondary | ICD-10-CM

## 2017-04-04 ENCOUNTER — Other Ambulatory Visit: Payer: Medicare HMO | Admitting: *Deleted

## 2017-04-04 DIAGNOSIS — G894 Chronic pain syndrome: Secondary | ICD-10-CM | POA: Diagnosis not present

## 2017-04-04 DIAGNOSIS — E782 Mixed hyperlipidemia: Secondary | ICD-10-CM | POA: Diagnosis not present

## 2017-04-04 DIAGNOSIS — M5441 Lumbago with sciatica, right side: Secondary | ICD-10-CM | POA: Diagnosis not present

## 2017-04-04 DIAGNOSIS — E781 Pure hyperglyceridemia: Secondary | ICD-10-CM | POA: Diagnosis not present

## 2017-04-04 DIAGNOSIS — M961 Postlaminectomy syndrome, not elsewhere classified: Secondary | ICD-10-CM | POA: Diagnosis not present

## 2017-04-04 DIAGNOSIS — M47816 Spondylosis without myelopathy or radiculopathy, lumbar region: Secondary | ICD-10-CM | POA: Diagnosis not present

## 2017-04-04 LAB — LIPID PANEL
CHOL/HDL RATIO: 4.1 ratio (ref 0.0–4.4)
Cholesterol, Total: 153 mg/dL (ref 100–199)
HDL: 37 mg/dL — ABNORMAL LOW (ref >39)
LDL CALC: 86 mg/dL (ref 0–99)
Triglycerides: 150 mg/dL — ABNORMAL HIGH (ref 0–149)
VLDL CHOLESTEROL CAL: 30 mg/dL (ref 5–40)

## 2017-04-04 LAB — HEPATIC FUNCTION PANEL
ALBUMIN: 4.2 g/dL (ref 3.5–5.5)
ALT: 11 IU/L (ref 0–32)
AST: 16 IU/L (ref 0–40)
Alkaline Phosphatase: 75 IU/L (ref 39–117)
Bilirubin Total: 0.3 mg/dL (ref 0.0–1.2)
Bilirubin, Direct: 0.11 mg/dL (ref 0.00–0.40)
TOTAL PROTEIN: 6.2 g/dL (ref 6.0–8.5)

## 2017-04-11 ENCOUNTER — Ambulatory Visit (INDEPENDENT_AMBULATORY_CARE_PROVIDER_SITE_OTHER)
Admission: RE | Admit: 2017-04-11 | Discharge: 2017-04-11 | Disposition: A | Discharge status: Home / Self Care | Payer: Medicare HMO | Source: Ambulatory Visit | Attending: Acute Care | Admitting: Acute Care

## 2017-04-11 ENCOUNTER — Encounter: Payer: Self-pay | Admitting: Cardiovascular Disease

## 2017-04-11 ENCOUNTER — Encounter: Payer: Self-pay | Admitting: Acute Care

## 2017-04-11 ENCOUNTER — Ambulatory Visit (INDEPENDENT_AMBULATORY_CARE_PROVIDER_SITE_OTHER): Payer: Medicare HMO | Admitting: Acute Care

## 2017-04-11 DIAGNOSIS — Z87891 Personal history of nicotine dependence: Secondary | ICD-10-CM

## 2017-04-11 DIAGNOSIS — F17210 Nicotine dependence, cigarettes, uncomplicated: Secondary | ICD-10-CM

## 2017-04-11 DIAGNOSIS — Z122 Encounter for screening for malignant neoplasm of respiratory organs: Secondary | ICD-10-CM

## 2017-04-11 NOTE — Progress Notes (Signed)
Shared Decision Making Visit Lung Cancer Screening Program [phone])   Eligibility:  Age 56 y.o.  Pack Years Smoking History Calculation 33 pack year smoking history (# packs/per year x # years smoked)  Recent History of coughing up blood  no  Unexplained weight loss? no ( >Than 15 pounds within the last 6 months )  Prior History Lung / other cancer no (Diagnosis within the last 5 years already requiring surveillance chest CT Scans).  Smoking Status Current Smoker  Former Smokers: Years since quit: NA  Quit Date:NA  Visit Components:  Discussion included one or more decision making aids. yes  Discussion included risk/benefits of screening. yes  Discussion included potential follow up diagnostic testing for abnormal scans. yes  Discussion included meaning and risk of over diagnosis. yes  Discussion included meaning and risk of False Positives. yes  Discussion included meaning of total radiation exposure. yes  Counseling Included:  Importance of adherence to annual lung cancer LDCT screening. yes  Impact of comorbidities on ability to participate in the program. yes  Ability and willingness to under diagnostic treatment. yes  Smoking Cessation Counseling:  Current Smokers:   Discussed importance of smoking cessation. yes  Information about tobacco cessation classes and interventions provided to patient. yes  Patient provided with "ticket" for LDCT Scan. yes  Symptomatic Patient. no  Counseling  Diagnosis Code: Tobacco Use Z72.0  Asymptomatic Patient yes  Counseling (Intermediate counseling: > three minutes counseling) H2992  Former Smokers:   Discussed the importance of maintaining cigarette abstinence. yes  Diagnosis Code: Personal History of Nicotine Dependence. E26.834  Information about tobacco cessation classes and interventions provided to patient. Yes  Patient provided with "ticket" for LDCT Scan. yes  Written Order for Lung Cancer  Screening with LDCT placed in Epic. Yes (CT Chest Lung Cancer Screening Low Dose W/O CM) [id number] Z12.2-Screening of respiratory organs Z87.891-Personal history of nicotine dependence  I have spent 25 minutes of face to face time with [name] discussing the risks and benefits of lung cancer screening. We viewed a power point together that explained in detail the above noted topics. We paused at intervals to allow for questions to be asked and answered to ensure understanding.We discussed that the single most powerful action that she can take to decrease her risk of developing lung cancer is to quit smoking. We discussed whether or not she is ready to commit to setting a quit date. She is currently not ready to set a quit date.We discussed options for tools to aid in quitting smoking including nicotine replacement therapy, non-nicotine medications, support groups, Quit Smart classes, and behavior modification. We discussed that often times setting smaller, more achievable goals, such as eliminating 1 cigarette a day for a week and then 2 cigarettes a day for a week can be helpful in slowly decreasing the number of cigarettes smoked. This allows for a sense of accomplishment as well as providing a clinical benefit. I gave her the " Be Stronger Than Your Excuses" card with contact information for community resources, classes, free nicotine replacement therapy, and access to mobile apps, text messaging, and on-line smoking cessation help. I have also given her my card and contact information in the event she needs to contact me. We discussed the time and location of the scan, and that either Doroteo Glassman RN or I will call with the results within 24-48 hours of receiving them. I have offered her  a copy of the power point we viewed  as a  resource in the event they need reinforcement of the concepts we discussed today in the office. The patient verbalized understanding of all of  the above and had no further  questions upon leaving the office. They have my contact information in the event they have any further questions.  I spent 4 minutes counseling on smoking cessation and the health risks of continued tobacco abuse.  I explained to the patient that there has been a high incidence of coronary artery disease noted on these exams. I explained that this is a non-gated exam therefore degree or severity cannot be determined. This patient is currently on statin therapy. I have asked the patient to follow-up with their PCP regarding any incidental finding of coronary artery disease and management with diet or medication as their PCP  feels is clinically indicated. The patient verbalized understanding of the above and had no further questions upon completion of the visit.       Magdalen Spatz, NP  04/11/2017

## 2017-04-17 ENCOUNTER — Telehealth: Payer: Self-pay | Admitting: Acute Care

## 2017-04-17 ENCOUNTER — Other Ambulatory Visit: Payer: Self-pay | Admitting: Family Medicine

## 2017-04-17 ENCOUNTER — Encounter: Payer: Self-pay | Admitting: Family Medicine

## 2017-04-17 DIAGNOSIS — Z122 Encounter for screening for malignant neoplasm of respiratory organs: Secondary | ICD-10-CM

## 2017-04-17 DIAGNOSIS — F17210 Nicotine dependence, cigarettes, uncomplicated: Secondary | ICD-10-CM

## 2017-04-17 NOTE — Telephone Encounter (Signed)
Pt informed of CT results per Sarah Groce, NP.  PT verbalized understanding.  Copy sent to PCP.  Order placed for 1 yr f/u CT.  

## 2017-04-18 NOTE — Telephone Encounter (Signed)
Pt [name].   Didn't know if she wanted this pt on both of these meds.

## 2017-04-20 ENCOUNTER — Encounter: Payer: Self-pay | Admitting: Family Medicine

## 2017-04-26 ENCOUNTER — Encounter: Payer: Self-pay | Admitting: Family Medicine

## 2017-04-30 ENCOUNTER — Telehealth: Payer: Self-pay | Admitting: Family Medicine

## 2017-04-30 MED ORDER — RANITIDINE HCL 150 MG PO TABS: 150.0000 mg | ORAL_TABLET | Freq: Two times a day (BID) | ORAL | 0 refills | Status: DC

## 2017-04-30 NOTE — Telephone Encounter (Signed)
OV 10/05/16  Last refill 02/16/16 180# 3 refills  Refilled x 30 days

## 2017-04-30 NOTE — Telephone Encounter (Signed)
Copied from [place]. Topic: Quick Communication - See Telephone Encounter >> Apr 30, 2017 12:02 PM Boyd Kerbs wrote: CRM for notification. See Telephone encounter for:  Patient called saying needing zantac and is out. She says if you could call in enough until wellness check on the 10th.   [place], [place] [place] [address] Phone: [phone] Fax: [phone]   04/30/17.

## 2017-05-01 ENCOUNTER — Encounter: Payer: Self-pay | Admitting: Family Medicine

## 2017-05-10 ENCOUNTER — Other Ambulatory Visit: Payer: Self-pay

## 2017-05-10 ENCOUNTER — Ambulatory Visit (INDEPENDENT_AMBULATORY_CARE_PROVIDER_SITE_OTHER): Payer: Medicare HMO | Admitting: Family Medicine

## 2017-05-10 ENCOUNTER — Encounter: Payer: Self-pay | Admitting: Family Medicine

## 2017-05-10 VITALS — BP 102/60 | HR 74 | Temp 98.7°F | Resp 16 | Ht 62.6 in | Wt 157.0 lb

## 2017-05-10 DIAGNOSIS — Z5181 Encounter for therapeutic drug level monitoring: Secondary | ICD-10-CM

## 2017-05-10 DIAGNOSIS — Z136 Encounter for screening for cardiovascular disorders: Secondary | ICD-10-CM | POA: Diagnosis not present

## 2017-05-10 DIAGNOSIS — Z0000 Encounter for general adult medical examination without abnormal findings: Secondary | ICD-10-CM

## 2017-05-10 DIAGNOSIS — Z1212 Encounter for screening for malignant neoplasm of rectum: Secondary | ICD-10-CM | POA: Diagnosis not present

## 2017-05-10 DIAGNOSIS — K219 Gastro-esophageal reflux disease without esophagitis: Secondary | ICD-10-CM

## 2017-05-10 DIAGNOSIS — F17200 Nicotine dependence, unspecified, uncomplicated: Secondary | ICD-10-CM

## 2017-05-10 DIAGNOSIS — D751 Secondary polycythemia: Secondary | ICD-10-CM

## 2017-05-10 DIAGNOSIS — Z1389 Encounter for screening for other disorder: Secondary | ICD-10-CM

## 2017-05-10 DIAGNOSIS — Z1383 Encounter for screening for respiratory disorder NEC: Secondary | ICD-10-CM

## 2017-05-10 DIAGNOSIS — Z124 Encounter for screening for malignant neoplasm of cervix: Secondary | ICD-10-CM | POA: Diagnosis not present

## 2017-05-10 DIAGNOSIS — Z113 Encounter for screening for infections with a predominantly sexual mode of transmission: Secondary | ICD-10-CM | POA: Diagnosis not present

## 2017-05-10 DIAGNOSIS — Z1211 Encounter for screening for malignant neoplasm of colon: Secondary | ICD-10-CM

## 2017-05-10 LAB — POCT URINALYSIS DIP (MANUAL ENTRY)
BILIRUBIN UA: NEGATIVE
BILIRUBIN UA: NEGATIVE mg/dL
Blood, UA: NEGATIVE
Leukocytes, UA: NEGATIVE
Nitrite, UA: NEGATIVE
PROTEIN UA: NEGATIVE mg/dL
SPEC GRAV UA: 1.01 (ref 1.010–1.025)
Urobilinogen, UA: 0.2 E.U./dL
pH, UA: 6 (ref 5.0–8.0)

## 2017-05-10 MED ORDER — OMEPRAZOLE 40 MG PO CPDR: DELAYED_RELEASE_CAPSULE | ORAL | 3 refills | Status: DC

## 2017-05-10 MED ORDER — IPRATROPIUM-ALBUTEROL 20-100 MCG/ACT IN AERS: 1.0000 | INHALATION_SPRAY | Freq: Four times a day (QID) | RESPIRATORY_TRACT | 11 refills | Status: DC

## 2017-05-10 MED ORDER — RANITIDINE HCL 150 MG PO TABS: 150.0000 mg | ORAL_TABLET | Freq: Two times a day (BID) | ORAL | 3 refills | Status: DC

## 2017-05-10 NOTE — Progress Notes (Signed)
Subjective:  By signing my name below, I, [name], attest that this documentation has been prepared under the direction and in the presence of [name], MD Electronically Signed: Ladene [name], ED Scribe 05/10/2017 at 4:26 PM.   Patient ID: [name], female    DOB: July 13, 1960, 57 y.o.   MRN: [id number]  Chief Complaint  Patient presents with  . Annual Exam   HPI [name] is a 57 y.o. female who presents to Primary Care at [facility] for an annual exam. Pt is not fasting at this visit; last meal was at noon today.  Primary Preventative Screenings: Cervical Cancer: s/p hysterectomy in 1984 for benign hemorrhagia, pap done 03/2014 with neg high risk HPV, no further paps needed. Pelvic US 09/2015 showed unremarkable vaginal cuff, neither ovary able to be seen likely due to atrophy but otherwise normal. Denies pelvic symptoms, vaginal discharge, dyspareunia. STI screening: Breast Cancer: mammogram 07/04/16 Colorectal Cancer: colonoscopy done 08/2016, rpt in 5 yrs by Dr. Deatra Ina Tobacco use/EtOH/substances: ongoing smoking. Chest CT screening for lung CA 04/11/17, neg but showed possible COPD Bone Density: dexa 2011, normal Cardiac: follows cardiology regularly Dr. Ron Parker for h/o CAD s/p MI. Pt notes that she missed her last stress test due to back surgery. Denies cp or any other symptoms. Weight/Blood sugar/Diet/Exercise: breakfast consists of banana and peanut butter or vanilla yogurt and blueberries. Also states she has cut out all "white" foods from her diet BMI Readings from Last 3 Encounters:  05/10/17 28.17 kg/m  03/24/17 27.45 kg/m  01/29/17 27.40 kg/m   Lab Results  Component Value Date   HGBA1C 6.1 01/29/2017   OTC/Vit/Supp/Herbal: she is still taking fish oil, states she goes outside in the sun daily and eats a lot of cheese Dentist/Optho: Immunizations: she is on a waiting list for the shingles vaccine at her pharmacy Immunization History    Administered Date(s) Administered  . Influenza, Seasonal, Injecte, Preservative Fre 06/21/2012  . Influenza,inj,Quad PF,6+ Mos 01/31/2013, 01/13/2014, 02/01/2016  . Influenza-Unspecified 07/07/2015, 03/05/2017  . Pneumococcal Conjugate-13 02/02/2016  . Pneumococcal Polysaccharide-23 03/13/2014  . Tdap 08/19/2011   Chronic Medical Conditions: 1. Hypothyroidism s/p radioiodine ablation followed by Dr. Loanne Drilling. On levothyroxine.  2. Chronic Back Pain - pt states that she will only allow 1 more injection. She has not noticed any improvement with injections and states that she now has bulges at the top and bottom of surgery site  Past Medical History:  Diagnosis Date  . Allergy   . Anxiety   . Arthritis   . Back pain, chronic   . Bipolar disorder ([place])   . Bronchitis    hx-no inhalers at home  . Carotid bruit   . Carpal tunnel syndrome on both sides   . Coronary artery disease   . DDD (degenerative disc disease), lumbar   . Depression   . Diabetes mellitus    diet controlled  . Ejection fraction    .  Marland Kitchen GERD (gastroesophageal reflux disease)   . Hx of CABG   . Hypercholesteremia   . Hypertension   . Hypothyroidism   . Multiple thyroid nodules   . Myocardial infarction ([place]) 11/20/06  . Neuromuscular disorder ([place])   . Nodule of neck    Current Outpatient Medications on File Prior to Visit  Medication Sig Dispense Refill  . acarbose (PRECOSE) 25 MG tablet Take 1 tablet (25 mg total) by mouth 3 (three) times daily with meals. 270 tablet 3  . ACCU-CHEK FASTCLIX LANCETS  MISC Use to check blood sugar 3 times daily. 306 each 1  . aspirin EC 81 MG tablet Take 81 mg by mouth 2 (two) times daily.    . Bisacodyl (DULCOLAX PO) Take by mouth.    . Blood Glucose Monitoring Suppl (ACCU-CHEK GUIDE) w/Device KIT 1 each by Does not apply route 3 (three) times daily. 1 kit 1  . bromocriptine (PARLODEL) 2.5 MG tablet TAKE 1/2 TABLET EVERY DAY 45 tablet 1  . carvedilol (COREG) 3.125 MG tablet  TAKE 1 TABLET (3.125 MG TOTAL) BY MOUTH 2 (TWO) TIMES DAILY. 180 tablet 3  . diazepam (VALIUM) 5 MG tablet Take 1-2 tablets (5-10 mg total) by mouth every 8 (eight) hours as needed for anxiety or muscle spasms. Make sure to separate this by 4 hours from any narcotic such as the oxycodone. 60 tablet 0  . DULoxetine (CYMBALTA) 60 MG capsule Take 60 mg by mouth 2 (two) times daily.    . empagliflozin (JARDIANCE) 10 MG TABS tablet Take 10 mg by mouth daily. 30 tablet 11  . fenofibrate (TRICOR) 145 MG tablet Take 1 tablet (145 mg total) by mouth daily. 90 tablet 3  . fluticasone (FLONASE) 50 MCG/ACT nasal spray Place 2 sprays into both nostrils at bedtime. 16 g 2  . gabapentin (NEURONTIN) 800 MG tablet Take 1 tablet (800 mg total) by mouth 4 (four) times daily. 360 tablet 1  . glucose blood (ACCU-CHEK GUIDE) test strip Use to check blood sugar 3 times daily. 300 each 1  . Ipratropium-Albuterol (COMBIVENT RESPIMAT) 20-100 MCG/ACT AERS respimat Inhale 1 puff into the lungs every 6 (six) hours. 4 g 1  . levothyroxine (SYNTHROID, LEVOTHROID) 100 MCG tablet Take 1 tablet (100 mcg total) by mouth daily before breakfast. 30 tablet 11  . metFORMIN (GLUCOPHAGE) 1000 MG tablet Take 1 tablet (1,000 mg total) daily with breakfast by mouth. 90 tablet 1  . Methylnaltrexone Bromide 150 MG TABS Take 3 tablets by mouth daily. 90 tablet 2  . Omega-3 Fatty Acids (FISH OIL) 1000 MG CAPS Take by mouth.    Marland Kitchen omeprazole (PRILOSEC) 40 MG capsule TAKE 1 CAPSULE DAILY 30 MINUTES BEFORE A MEAL 90 capsule 3  . oxyCODONE-acetaminophen (PERCOCET) 10-325 MG tablet Take 1 tablet by mouth every 4 (four) hours as needed for pain.    Marland Kitchen QUEtiapine (SEROQUEL) 200 MG tablet Take 200 mg by mouth 3 (three) times daily.    . ranitidine (ZANTAC) 150 MG tablet Take 1 tablet (150 mg total) by mouth 2 (two) times daily. 180 tablet 3  . rosuvastatin (CRESTOR) 20 MG tablet TAKE 1 TABLET (20 MG TOTAL) BY MOUTH DAILY. 90 tablet 0  . tiZANidine  (ZANAFLEX) 4 MG tablet Take 4 mg by mouth every 6 (six) hours as needed for muscle spasms.    . traZODone (DESYREL) 100 MG tablet Take 1 tablet (100 mg total) by mouth at bedtime as needed for sleep. 30 tablet 2   Current Facility-Administered Medications on File Prior to Visit  Medication Dose Route Frequency Provider Last Rate Last Dose  . albuterol (PROVENTIL) (2.5 MG/3ML) 0.083% nebulizer solution 2.5 mg  2.5 mg Nebulization Once Shawnee Knapp, MD       Allergies  Allergen Reactions  . Coconut Flavor Swelling and Rash  . Ibuprofen Swelling and Rash  . Prednisone Swelling and Rash  . Cyclobenzaprine   . Morphine Nausea And Vomiting   Past Surgical History:  Procedure Laterality Date  . ABDOMINAL HYSTERECTOMY  1984  .  BREAST EXCISIONAL BIOPSY Right   . BREAST SURGERY     rt br cystx2  . CORONARY ARTERY BYPASS GRAFT  2008  . KNEE SURGERY  2009   lt   . ORIF ANKLE FRACTURE Right 07/12/2012   Procedure: OPEN REDUCTION INTERNAL FIXATION (ORIF) RIGHT ANKLE FRACTURE;  Surgeon: Alta Corning, MD;  Location: [facility];  Service: Orthopedics;  Laterality: Right;  . SHOULDER SURGERY  11/08/2016  . TUBAL LIGATION    . WRIST SURGERY Left    Family History  Problem Relation Age of Onset  . Heart disease Mother   . Hyperlipidemia Mother   . Hypertension Mother   . Heart disease Father   . Stroke Father   . Hypertension Father   . Hyperlipidemia Father   . Hypertension Sister   . Hypertension Brother   . Cancer Daughter        unknown  . Hypertension Maternal Grandmother   . Hypertension Maternal Grandfather   . Cancer Maternal Grandfather   . Hypertension Paternal Grandmother   . Hypertension Paternal Grandfather   . Cancer Maternal Aunt        breast and ovarian  . Cancer Paternal Aunt        breast  . Colon cancer Neg Hx    Social History   Socioeconomic History  . Marital status: Married    Spouse name: None  . Number of children: None  . Years of  education: None  . Highest education level: None  Social Needs  . Financial resource strain: None  . Food insecurity - worry: None  . Food insecurity - inability: None  . Transportation needs - medical: None  . Transportation needs - non-medical: None  Occupational History  . None  Tobacco Use  . Smoking status: Current Every Day Smoker    Packs/day: 1.00    Years: 33.00    Pack years: 33.00    Types: Cigarettes  . Smokeless tobacco: Never Used  Substance and Sexual Activity  . Alcohol use: No  . Drug use: No  . Sexual activity: None  Other Topics Concern  . None  Social History Narrative  . None   Depression screen [facility] 2/9 05/10/2017 05/10/2017 03/24/2017 01/18/2017 11/22/2016  Decreased Interest 0 0 0 0 1  Down, Depressed, Hopeless 0 0 0 0 0  PHQ - 2 Score 0 0 0 0 1  Altered sleeping - - - - -  Tired, decreased energy - - - - -  Change in appetite - - - - -  Feeling bad or failure about yourself  - - - - -  Trouble concentrating - - - - -  Moving slowly or fidgety/restless - - - - -  Suicidal thoughts - - - - -  PHQ-9 Score - - - - -    Review of Systems  Cardiovascular: Negative for chest pain.  Gastrointestinal: Negative for constipation and diarrhea.  Genitourinary: Negative for dyspareunia and vaginal discharge.      Objective:   Physical Exam  Constitutional: She is oriented to person, place, and time. She appears well-developed and well-nourished. No distress.  HENT:  Head: Normocephalic and atraumatic.  Right Ear: Tympanic membrane normal.  Left Ear: Tympanic membrane normal.  Nose: Nose normal.  Mouth/Throat: Mucous membranes are dry.  Eyes: Conjunctivae and EOM are normal.  Neck: Neck supple. No tracheal deviation present. No thyroid mass and no thyromegaly present.  Cardiovascular: Normal rate, regular rhythm, S1 normal, S2  normal and normal heart sounds.  Pulses:      Dorsalis pedis pulses are 2+ on the right side, and 2+ on the left side.        Posterior tibial pulses are 2+ on the right side, and 2+ on the left side.  Pulmonary/Chest: Effort normal and breath sounds normal. No respiratory distress.  Abdominal: Soft. Bowel sounds are normal. She exhibits no distension. There is no hepatosplenomegaly. There is no tenderness.  Musculoskeletal: Normal range of motion. She exhibits no edema.  Lymphadenopathy:    She has no cervical adenopathy.  Neurological: She is alert and oriented to person, place, and time.  Skin: Skin is warm and dry.  Psychiatric: She has a normal mood and affect. Her behavior is normal.  Nursing note and vitals reviewed.  BP 102/60   Pulse 74   Temp 98.7 F (37.1 C)   Resp 16   Ht 5' 2.6" (1.59 m)   Wt 157 lb (71.2 kg)   SpO2 94%   BMI 28.17 kg/m     Results for orders placed or performed in visit on 05/10/17  POCT urinalysis dipstick  Result Value Ref Range   Color, UA yellow yellow   Clarity, UA clear clear   Glucose, UA >=1,000 (A) negative mg/dL   Bilirubin, UA negative negative   Ketones, POC UA negative negative mg/dL   Spec Grav, UA 1.010 1.010 - 1.025   Blood, UA negative negative   pH, UA 6.0 5.0 - 8.0   Protein Ur, POC negative negative mg/dL   Urobilinogen, UA 0.2 0.2 or 1.0 E.U./dL   Nitrite, UA Negative Negative   Leukocytes, UA Negative Negative   Assessment & Plan:  Okay to refill diazepam when needed.  1. Annual physical exam   2. Screening for cardiovascular, respiratory, and genitourinary diseases   3. Screening for colorectal cancer   4. Routine screening for STI (sexually transmitted infection)   5. Screening for cervical cancer - none needed. pt is s/p hysterectomy for benign indications and had a nml pap sev yrs ago and a nml pelvic US last yr - ovaries not visualized.  6. TOBACCO ABUSE - ongoing. Spirometry done last visit 11/24 - pre and post, screening lung CT scan UTD.  Was normal with pre-bronchodialator FEV1/FVC 105% pred (but red restriction possible as both FEV1  and FVC were in 70%tile and post was normal (FEV1 and FVC were both 80s%tile).  CT showed mild centrilobular and paraseptal emphysema  7. Polycythemia, secondary - improved to normal today!  8. Medication monitoring encounter   9.      Gastroesophageal reflux disease, esophagitis presence not specified - cont ppi and h2 blocker - sxs intolerable and severe w/o both.  Orders Placed This Encounter  Procedures  . CBC with Differential/Platelet  . Comprehensive metabolic panel  . POCT urinalysis dipstick    Meds ordered this encounter  Medications  . Ipratropium-Albuterol (COMBIVENT RESPIMAT) 20-100 MCG/ACT AERS respimat    Sig: Inhale 1 puff into the lungs every 6 (six) hours.    Dispense:  4 g    Refill:  11    I personally performed the services described in this documentation, which was scribed in my presence. The recorded information has been reviewed and considered, and addended by me as needed.   [name], M.D.  Primary Care at [facility] [address] [place], [place] [address] [phone] phone [phone] fax  05/13/17 1:42 PM

## 2017-05-10 NOTE — Patient Instructions (Addendum)
If you have Medicare and/or a Medicare supplemental insurance, please make an appointment at the scheduling desk with Dewitt Hoes, our Nurse Health and Wellness Advisor, for your Wellness Visit done yearly at [address] appointment clinic, which is a FREE annual benefit with your insurance. If you have a Medicare supplement insurance (such as [employer], [place], Nurse, mental health, Social research officer, government but NOT traditional medicare ONLY), you can then schedule to have your yearly complete physical exam the next week with me, if not already done.     IF you received an x-ray today, you will receive an invoice from [facility]. Please contact [facility] at [phone] with questions or concerns regarding your invoice.   IF you received labwork today, you will receive an invoice from [place]. Please contact [facility] at [phone] with questions or concerns regarding your invoice.   Our billing staff will not be able to assist you with questions regarding bills from these companies.  You will be contacted with the lab results as soon as they are available. The fastest way to get your results is to activate your My Chart account. Instructions are located on the last page of this paperwork. If you have not heard from [place] regarding the results in 2 weeks, please contact this office.      Health Maintenance for Postmenopausal Women Menopause is a normal process in which your reproductive ability comes to an end. This process happens gradually over a span of months to years, usually between the ages of 60 and 83. Menopause is complete when you have missed 12 consecutive menstrual periods. It is important to talk with your health care provider about some of the most common conditions that affect postmenopausal women, such as heart disease, cancer, and bone loss (osteoporosis). Adopting a healthy lifestyle and getting preventive care can help to promote your health and wellness. Those actions can also lower  your chances of developing some of these common conditions. What should I know about menopause? During menopause, you may experience a number of symptoms, such as:  Moderate-to-severe hot flashes.  Night sweats.  Decrease in sex drive.  Mood swings.  Headaches.  Tiredness.  Irritability.  Memory problems.  Insomnia.  Choosing to treat or not to treat menopausal changes is an individual decision that you make with your health care provider. What should I know about hormone replacement therapy and supplements? Hormone therapy products are effective for treating symptoms that are associated with menopause, such as hot flashes and night sweats. Hormone replacement carries certain risks, especially as you become older. If you are thinking about using estrogen or estrogen with progestin treatments, discuss the benefits and risks with your health care provider. What should I know about heart disease and stroke? Heart disease, heart attack, and stroke become more likely as you age. This may be due, in part, to the hormonal changes that your body experiences during menopause. These can affect how your body processes dietary fats, triglycerides, and cholesterol. Heart attack and stroke are both medical emergencies. There are many things that you can do to help prevent heart disease and stroke:  Have your blood pressure checked at least every 1-2 years. High blood pressure causes heart disease and increases the risk of stroke.  If you are 32-11 years old, ask your health care provider if you should take aspirin to prevent a heart attack or a stroke.  Do not use any tobacco products, including cigarettes, chewing tobacco, or electronic cigarettes. If you need help quitting, ask your  health care provider.  It is important to eat a healthy diet and maintain a healthy weight. ? Be sure to include plenty of vegetables, fruits, low-fat dairy products, and lean protein. ? Avoid eating foods that  are high in solid fats, added sugars, or salt (sodium).  Get regular exercise. This is one of the most important things that you can do for your health. ? Try to exercise for at least 150 minutes each week. The type of exercise that you do should increase your heart rate and make you sweat. This is known as moderate-intensity exercise. ? Try to do strengthening exercises at least twice each week. Do these in addition to the moderate-intensity exercise.  Know your numbers.Ask your health care provider to check your cholesterol and your blood glucose. Continue to have your blood tested as directed by your health care provider.  What should I know about cancer screening? There are several types of cancer. Take the following steps to reduce your risk and to catch any cancer development as early as possible. Breast Cancer  Practice breast self-awareness. ? This means understanding how your breasts normally appear and feel. ? It also means doing regular breast self-exams. Let your health care provider know about any changes, no matter how small.  If you are 31 or older, have a clinician do a breast exam (clinical breast exam or CBE) every year. Depending on your age, family history, and medical history, it may be recommended that you also have a yearly breast X-ray (mammogram).  If you have a family history of breast cancer, talk with your health care provider about genetic screening.  If you are at high risk for breast cancer, talk with your health care provider about having an MRI and a mammogram every year.  Breast cancer (BRCA) gene test is recommended for women who have family members with BRCA-related cancers. Results of the assessment will determine the need for genetic counseling and BRCA1 and for BRCA2 testing. BRCA-related cancers include these types: ? Breast. This occurs in males or females. ? Ovarian. ? Tubal. This may also be called fallopian tube cancer. ? Cancer of the abdominal  or pelvic lining (peritoneal cancer). ? Prostate. ? Pancreatic.  Cervical, Uterine, and Ovarian Cancer Your health care provider may recommend that you be screened regularly for cancer of the pelvic organs. These include your ovaries, uterus, and vagina. This screening involves a pelvic exam, which includes checking for microscopic changes to the surface of your cervix (Pap test).  For women ages 21-65, health care providers may recommend a pelvic exam and a Pap test every three years. For women ages 45-65, they may recommend the Pap test and pelvic exam, combined with testing for human papilloma virus (HPV), every five years. Some types of HPV increase your risk of cervical cancer. Testing for HPV may also be done on women of any age who have unclear Pap test results.  Other health care providers may not recommend any screening for nonpregnant women who are considered low risk for pelvic cancer and have no symptoms. Ask your health care provider if a screening pelvic exam is right for you.  If you have had past treatment for cervical cancer or a condition that could lead to cancer, you need Pap tests and screening for cancer for at least 20 years after your treatment. If Pap tests have been discontinued for you, your risk factors (such as having a new sexual partner) need to be reassessed to determine if you  should start having screenings again. Some women have medical problems that increase the chance of getting cervical cancer. In these cases, your health care provider may recommend that you have screening and Pap tests more often.  If you have a family history of uterine cancer or ovarian cancer, talk with your health care provider about genetic screening.  If you have vaginal bleeding after reaching menopause, tell your health care provider.  There are currently no reliable tests available to screen for ovarian cancer.  Lung Cancer Lung cancer screening is recommended for adults 57-80 years  old who are at high risk for lung cancer because of a history of smoking. A yearly low-dose CT scan of the lungs is recommended if you:  Currently smoke.  Have a history of at least 30 pack-years of smoking and you currently smoke or have quit within the past 15 years. A pack-year is smoking an average of one pack of cigarettes per day for one year.  Yearly screening should:  Continue until it has been 15 years since you quit.  Stop if you develop a health problem that would prevent you from having lung cancer treatment.  Colorectal Cancer  This type of cancer can be detected and can often be prevented.  Routine colorectal cancer screening usually begins at age 89 and continues through age 37.  If you have risk factors for colon cancer, your health care provider may recommend that you be screened at an earlier age.  If you have a family history of colorectal cancer, talk with your health care provider about genetic screening.  Your health care provider may also recommend using home test kits to check for hidden blood in your stool.  A small camera at the end of a tube can be used to examine your colon directly (sigmoidoscopy or colonoscopy). This is done to check for the earliest forms of colorectal cancer.  Direct examination of the colon should be repeated every 5-10 years until age 49. However, if early forms of precancerous polyps or small growths are found or if you have a family history or genetic risk for colorectal cancer, you may need to be screened more often.  Skin Cancer  Check your skin from head to toe regularly.  Monitor any moles. Be sure to tell your health care provider: ? About any new moles or changes in moles, especially if there is a change in a mole's shape or color. ? If you have a mole that is larger than the size of a pencil eraser.  If any of your family members has a history of skin cancer, especially at a young age, talk with your health care provider  about genetic screening.  Always use sunscreen. Apply sunscreen liberally and repeatedly throughout the day.  Whenever you are outside, protect yourself by wearing long sleeves, pants, a wide-brimmed hat, and sunglasses.  What should I know about osteoporosis? Osteoporosis is a condition in which bone destruction happens more quickly than new bone creation. After menopause, you may be at an increased risk for osteoporosis. To help prevent osteoporosis or the bone fractures that can happen because of osteoporosis, the following is recommended:  If you are 22-13 years old, get at least 1,000 mg of calcium and at least 600 mg of vitamin D per day.  If you are older than age 33 but younger than age 29, get at least 1,200 mg of calcium and at least 600 mg of vitamin D per day.  If you are  older than age 80, get at least 1,200 mg of calcium and at least 800 mg of vitamin D per day.  Smoking and excessive alcohol intake increase the risk of osteoporosis. Eat foods that are rich in calcium and vitamin D, and do weight-bearing exercises several times each week as directed by your health care provider. What should I know about how menopause affects my mental health? Depression may occur at any age, but it is more common as you become older. Common symptoms of depression include:  Low or sad mood.  Changes in sleep patterns.  Changes in appetite or eating patterns.  Feeling an overall lack of motivation or enjoyment of activities that you previously enjoyed.  Frequent crying spells.  Talk with your health care provider if you think that you are experiencing depression. What should I know about immunizations? It is important that you get and maintain your immunizations. These include:  Tetanus, diphtheria, and pertussis (Tdap) booster vaccine.  Influenza every year before the flu season begins.  Pneumonia vaccine.  Shingles vaccine.  Your health care provider may also recommend other  immunizations. This information is not intended to replace advice given to you by your health care provider. Make sure you discuss any questions you have with your health care provider. Document Released: 06/09/2005 Document Revised: 11/05/2015 Document Reviewed: 01/19/2015 Elsevier Interactive Patient Education  2018 [employer].

## 2017-05-11 LAB — CBC WITH DIFFERENTIAL/PLATELET
BASOS ABS: 0 10*3/uL (ref 0.0–0.2)
Basos: 0 %
EOS (ABSOLUTE): 0 10*3/uL (ref 0.0–0.4)
Eos: 0 %
Hematocrit: 46.8 % — ABNORMAL HIGH (ref 34.0–46.6)
Hemoglobin: 14.5 g/dL (ref 11.1–15.9)
Immature Grans (Abs): 0 10*3/uL (ref 0.0–0.1)
Immature Granulocytes: 0 %
Lymphocytes Absolute: 1.4 10*3/uL (ref 0.7–3.1)
Lymphs: 18 %
MCH: 27.2 pg (ref 26.6–33.0)
MCHC: 31 g/dL — ABNORMAL LOW (ref 31.5–35.7)
MCV: 88 fL (ref 79–97)
MONOS ABS: 0.8 10*3/uL (ref 0.1–0.9)
Monocytes: 11 %
Neutrophils Absolute: 5.3 10*3/uL (ref 1.4–7.0)
Neutrophils: 71 %
PLATELETS: 359 10*3/uL (ref 150–379)
RBC: 5.33 x10E6/uL — AB (ref 3.77–5.28)
RDW: 17.4 % — AB (ref 12.3–15.4)
WBC: 7.5 10*3/uL (ref 3.4–10.8)

## 2017-05-11 LAB — COMPREHENSIVE METABOLIC PANEL
A/G RATIO: 1.7 (ref 1.2–2.2)
ALT: 10 IU/L (ref 0–32)
AST: 20 IU/L (ref 0–40)
Albumin: 4.4 g/dL (ref 3.5–5.5)
Alkaline Phosphatase: 73 IU/L (ref 39–117)
BUN/Creatinine Ratio: 25 — ABNORMAL HIGH (ref 9–23)
BUN: 19 mg/dL (ref 6–24)
Bilirubin Total: 0.2 mg/dL (ref 0.0–1.2)
CALCIUM: 9.5 mg/dL (ref 8.7–10.2)
CO2: 20 mmol/L (ref 20–29)
Chloride: 106 mmol/L (ref 96–106)
Creatinine, Ser: 0.75 mg/dL (ref 0.57–1.00)
GFR calc Af Amer: 104 mL/min/{1.73_m2} (ref >59)
GFR calc non Af Amer: 90 mL/min/{1.73_m2} (ref >59)
GLUCOSE: 98 mg/dL (ref 65–99)
Globulin, Total: 2.6 g/dL (ref 1.5–4.5)
Potassium: 4.2 mmol/L (ref 3.5–5.2)
Sodium: 142 mmol/L (ref 134–144)
Total Protein: 7 g/dL (ref 6.0–8.5)

## 2017-05-12 ENCOUNTER — Encounter: Payer: Self-pay | Admitting: Family Medicine

## 2017-05-13 DIAGNOSIS — K219 Gastro-esophageal reflux disease without esophagitis: Secondary | ICD-10-CM | POA: Insufficient documentation

## 2017-05-13 DIAGNOSIS — D751 Secondary polycythemia: Secondary | ICD-10-CM | POA: Insufficient documentation

## 2017-05-14 DIAGNOSIS — M47816 Spondylosis without myelopathy or radiculopathy, lumbar region: Secondary | ICD-10-CM | POA: Diagnosis not present

## 2017-05-14 NOTE — Telephone Encounter (Signed)
Yes , if she is not having any symptoms, she can wait and we will reassess the need for this at her next office visit

## 2017-05-18 IMAGING — RF DG C-ARM 61-120 MIN
1 series · 2 of 2 positions shown · non-contrast
Comparison: 10/21/2015

CLINICAL DATA: TLIF L4-5, right sided Fluoro time was 61 seconds

EXAM:
LUMBAR SPINE - 2-3 VIEW; DG C-ARM 61-120 MIN

[Series 1: run · 2 of 2 slices shown]
[im 1/2]
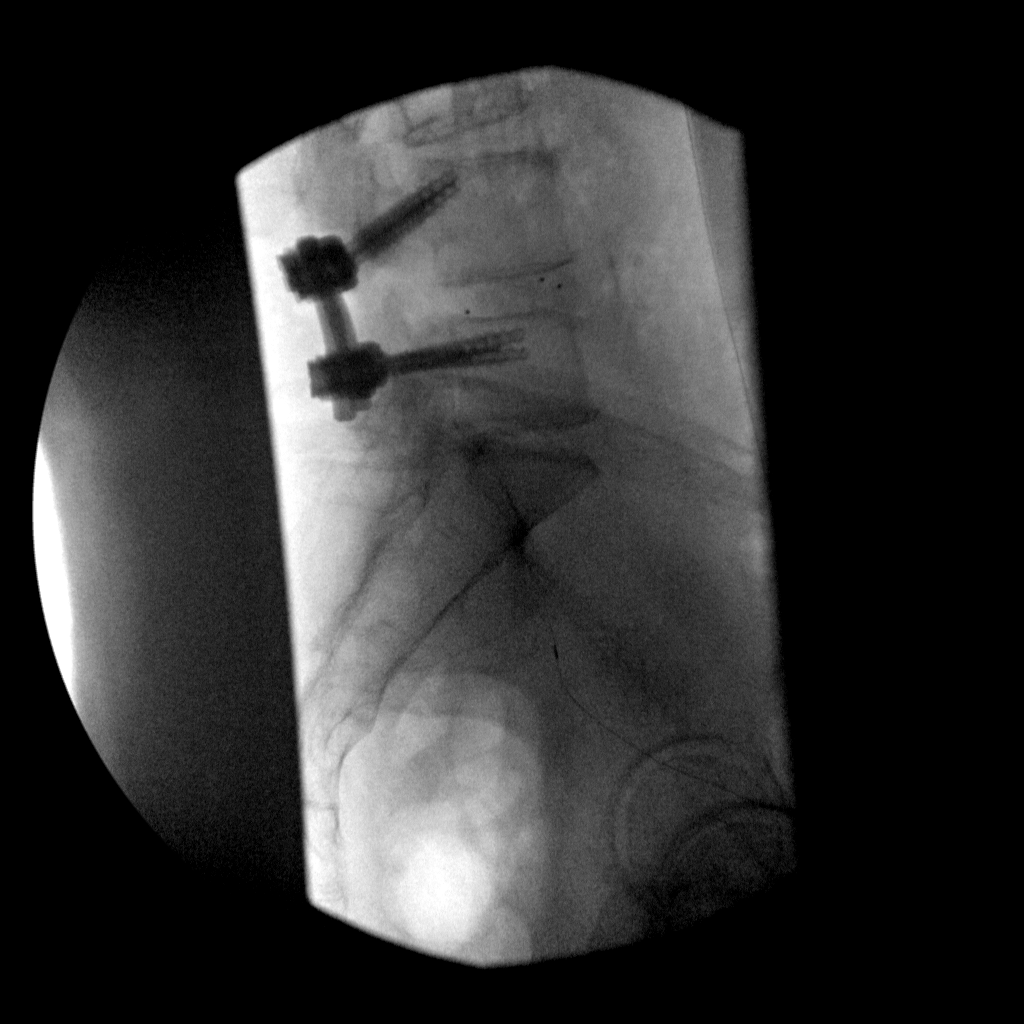
[im 2/2]
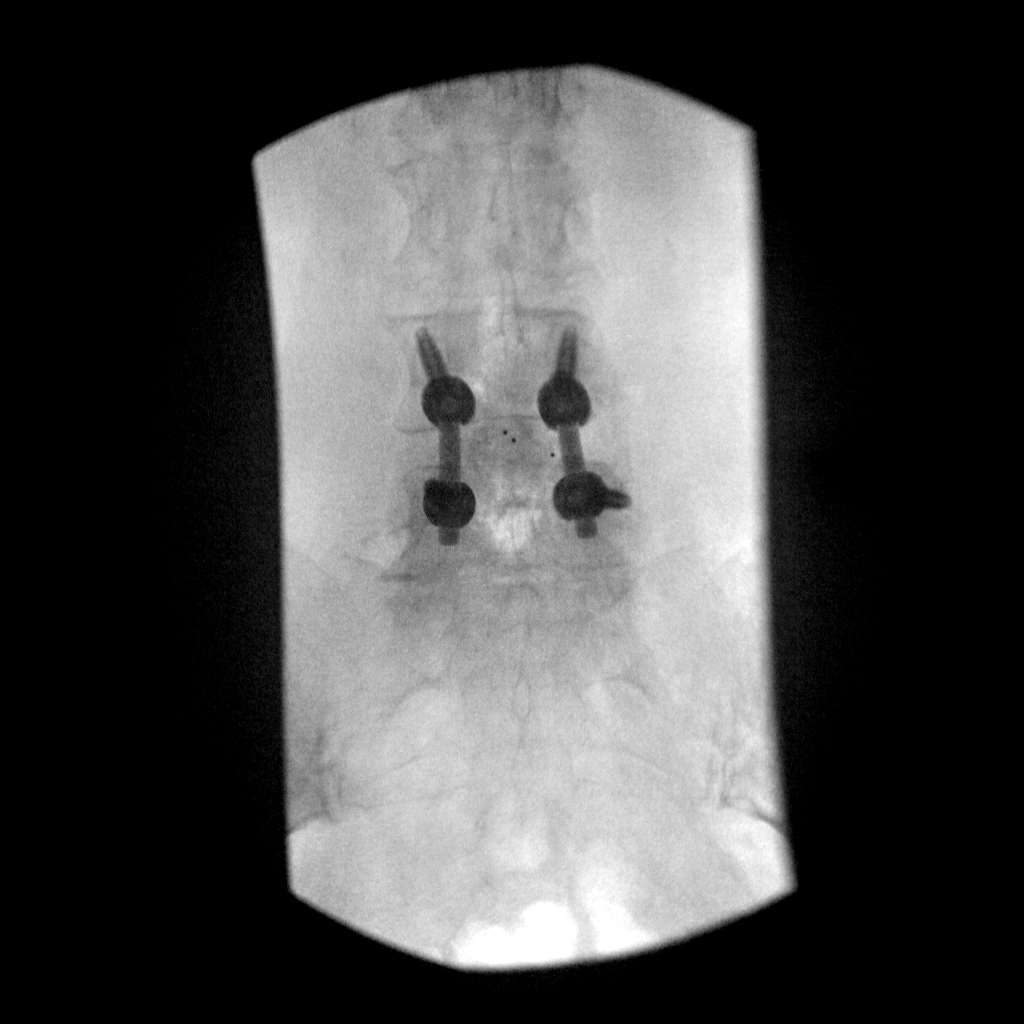

[2 of 2 positions shown; findings below may reference images not displayed]

FINDINGS: Numbering is based on previous imaging. Patient has undergone
posterior fusion L4 -L5 with interbody fusion device.
IMPRESSION: Status post posterior fusion L4-5.

## 2017-05-19 IMAGING — CR DG CHEST 1V PORT
1 series · 1 of 1 positions shown · non-contrast
Comparison: 10/19/2015.

CLINICAL DATA: 55-year-old female with fever after lumbar spine
surgery yesterday. Hypoxia. Initial encounter.

EXAM:
PORTABLE CHEST 1 VIEW

[AP]
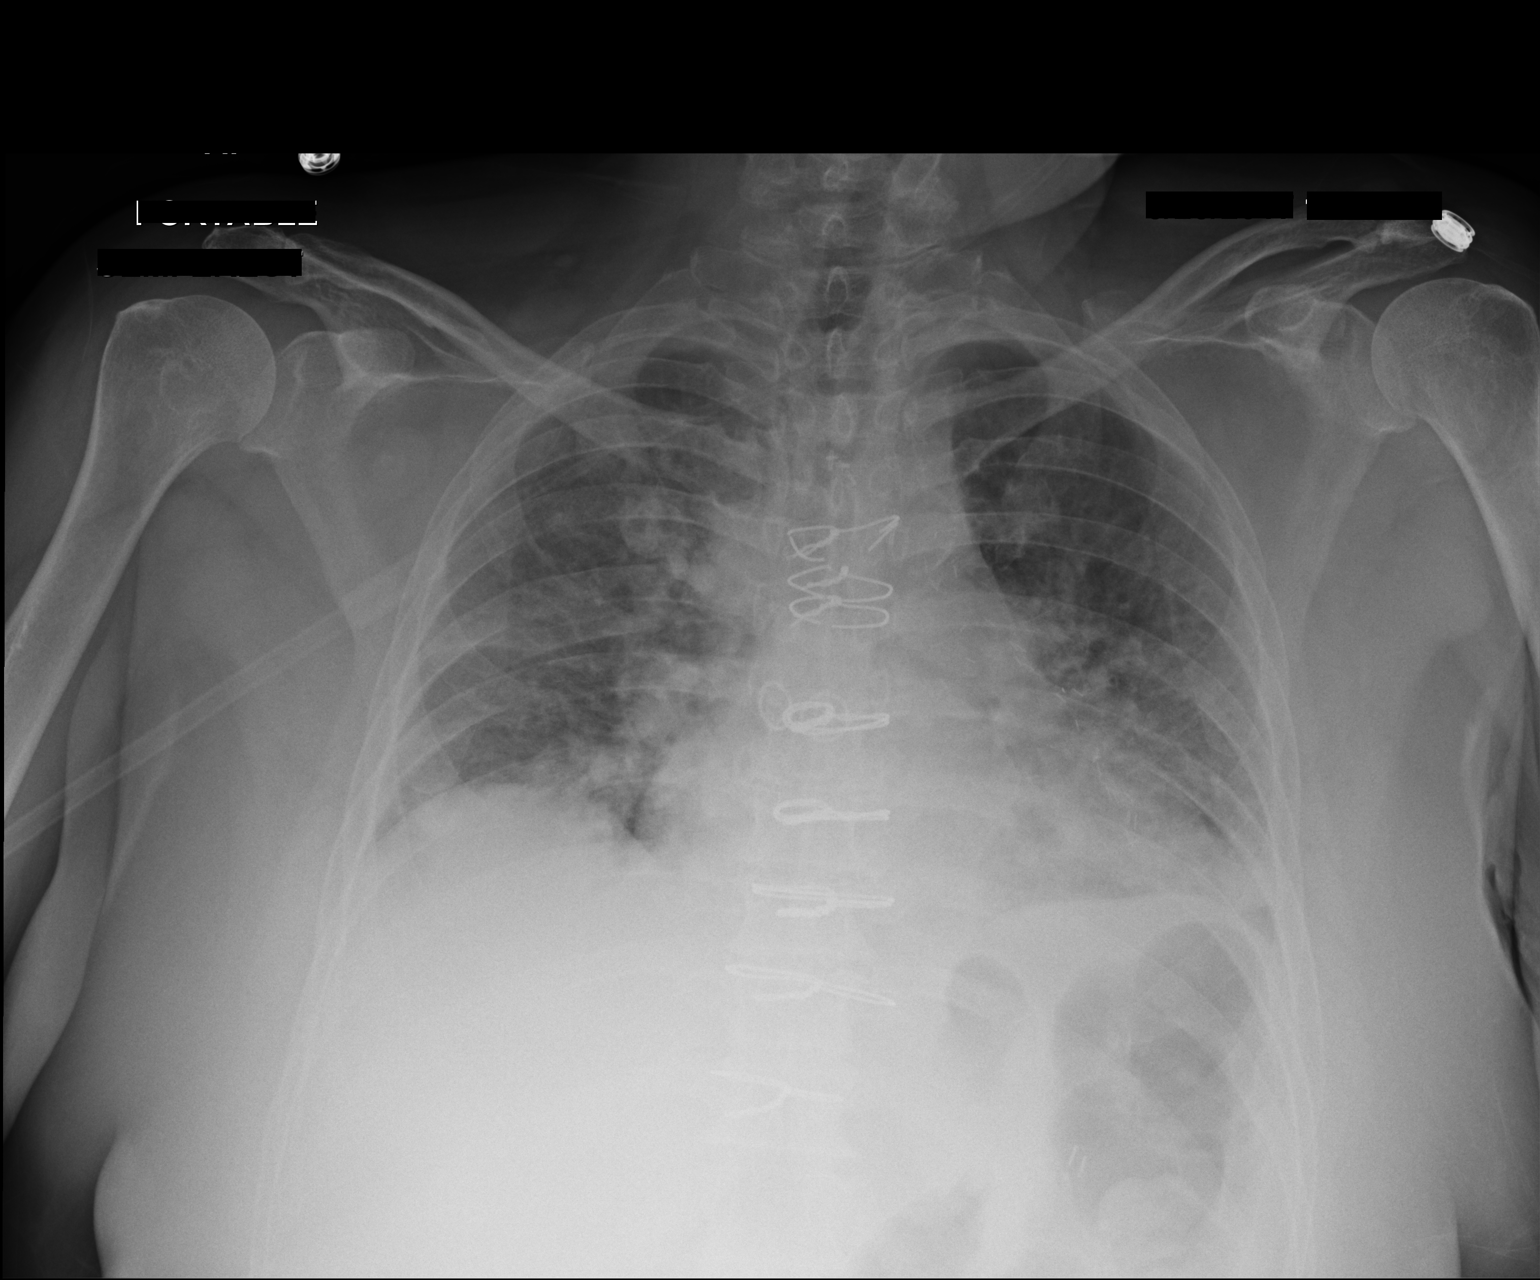

[1 of 1 positions shown; findings below may reference images not displayed]

FINDINGS: Portable AP semi upright view at 9810 hours. Lower lung volumes.
Increased interstitial and patchy bibasilar pulmonary opacity. No
pneumothorax. No definite pleural effusion. Stable mediastinal
contours allowing for lower lung volumes. Sequelae of CABG. Negative
visible bowel gas pattern.
IMPRESSION: Lower lung volumes with increased interstitial and patchy bibasilar
opacity.

Top differential considerations in this setting include pulmonary
interstitial edema with atelectasis versus lung base aspiration
pneumonia. No pleural effusion identified.

## 2017-05-22 ENCOUNTER — Encounter: Payer: Self-pay | Admitting: Family Medicine

## 2017-05-22 ENCOUNTER — Ambulatory Visit: Payer: Medicare HMO

## 2017-05-24 ENCOUNTER — Ambulatory Visit (INDEPENDENT_AMBULATORY_CARE_PROVIDER_SITE_OTHER): Payer: Medicare HMO

## 2017-05-24 VITALS — BP 120/68 | HR 73 | Ht 63.0 in | Wt 156.5 lb

## 2017-05-24 DIAGNOSIS — Z0000 Encounter for general adult medical examination without abnormal findings: Secondary | ICD-10-CM | POA: Diagnosis not present

## 2017-05-24 NOTE — Progress Notes (Signed)
Subjective:   [name] is a 57 y.o. female who presents for an Initial Medicare Annual Wellness Visit.  Review of Systems    N/A  Cardiac Risk Factors include: diabetes mellitus;hypertension;dyslipidemia;smoking/ tobacco exposure     Objective:    Today's Vitals   05/24/17 1013 05/24/17 1026  BP: 120/68   Pulse: 73   SpO2: 95%   Weight: 156 lb 8 oz (71 kg)   Height: 5' 3"  (1.6 m)   PainSc:  7    Body mass index is 27.72 kg/m.  Advanced Directives 05/24/2017 03/16/2016 10/22/2015 10/19/2015 08/06/2015 07/12/2012 07/11/2012  Does Patient Have a Medical Advance Directive? No No No No No Patient does not have advance directive Patient does not have advance directive  Would patient like information on creating a medical advance directive? No - Patient declined No - patient declined information - No - patient declined information No - patient declined information - -  Pre-existing out of facility DNR order (yellow form or pink MOST form) - - - - - No -    Current Medications (verified) Outpatient Encounter Medications as of 05/24/2017  Medication Sig  . acarbose (PRECOSE) 25 MG tablet Take 1 tablet (25 mg total) by mouth 3 (three) times daily with meals.  Marland Kitchen ACCU-CHEK FASTCLIX LANCETS MISC Use to check blood sugar 3 times daily.  Marland Kitchen aspirin EC 81 MG tablet Take 81 mg by mouth 2 (two) times daily.  . Bisacodyl (DULCOLAX PO) Take by mouth.  . Blood Glucose Monitoring Suppl (ACCU-CHEK GUIDE) w/Device KIT 1 each by Does not apply route 3 (three) times daily.  . carvedilol (COREG) 3.125 MG tablet TAKE 1 TABLET (3.125 MG TOTAL) BY MOUTH 2 (TWO) TIMES DAILY.  . diazepam (VALIUM) 5 MG tablet Take 1-2 tablets (5-10 mg total) by mouth every 8 (eight) hours as needed for anxiety or muscle spasms. Make sure to separate this by 4 hours from any narcotic such as the oxycodone.  . DULoxetine (CYMBALTA) 60 MG capsule Take 60 mg by mouth 2 (two) times daily.  . empagliflozin (JARDIANCE) 10  MG TABS tablet Take 10 mg by mouth daily.  . fenofibrate (TRICOR) 145 MG tablet Take 1 tablet (145 mg total) by mouth daily.  . fluticasone (FLONASE) 50 MCG/ACT nasal spray Place 2 sprays into both nostrils at bedtime.  . gabapentin (NEURONTIN) 800 MG tablet Take 1 tablet (800 mg total) by mouth 4 (four) times daily.  Marland Kitchen glucose blood (ACCU-CHEK GUIDE) test strip Use to check blood sugar 3 times daily.  . Ipratropium-Albuterol (COMBIVENT RESPIMAT) 20-100 MCG/ACT AERS respimat Inhale 1 puff into the lungs every 6 (six) hours.  Marland Kitchen levothyroxine (SYNTHROID, LEVOTHROID) 100 MCG tablet Take 1 tablet (100 mcg total) by mouth daily before breakfast.  . metFORMIN (GLUCOPHAGE) 1000 MG tablet Take 1 tablet (1,000 mg total) daily with breakfast by mouth.  . Omega-3 Fatty Acids (FISH OIL) 1000 MG CAPS Take by mouth.  Marland Kitchen omeprazole (PRILOSEC) 40 MG capsule TAKE 1 CAPSULE DAILY 30 MINUTES BEFORE A MEAL  . oxyCODONE-acetaminophen (PERCOCET) 10-325 MG tablet Take 1 tablet by mouth every 4 (four) hours as needed for pain.  Marland Kitchen QUEtiapine (SEROQUEL) 200 MG tablet Take 200 mg by mouth 3 (three) times daily.  . ranitidine (ZANTAC) 150 MG tablet Take 1 tablet (150 mg total) by mouth 2 (two) times daily.  . rosuvastatin (CRESTOR) 20 MG tablet TAKE 1 TABLET (20 MG TOTAL) BY MOUTH DAILY.  Marland Kitchen tiZANidine (ZANAFLEX) 4 MG tablet Take  4 mg by mouth every 6 (six) hours as needed for muscle spasms.  . traZODone (DESYREL) 100 MG tablet Take 1 tablet (100 mg total) by mouth at bedtime as needed for sleep.   No facility-administered encounter medications on file as of 05/24/2017.     Allergies (verified) Coconut flavor; Ibuprofen; Prednisone; Cyclobenzaprine; and Morphine   History: Past Medical History:  Diagnosis Date  . Allergy   . Anxiety   . Arthritis   . Back pain, chronic   . Bipolar disorder ([place])   . Bronchitis    hx-no inhalers at home  . Carotid bruit   . Carpal tunnel syndrome on both sides   . Coronary artery  disease   . DDD (degenerative disc disease), lumbar   . Depression   . Diabetes mellitus    diet controlled  . Ejection fraction    .  Marland Kitchen GERD (gastroesophageal reflux disease)   . Hx of CABG   . Hypercholesteremia   . Hypertension   . Hypothyroidism   . Multiple thyroid nodules   . Myocardial infarction ([place]) 11/20/06  . Neuromuscular disorder ([place])   . Nodule of neck    Past Surgical History:  Procedure Laterality Date  . ABDOMINAL HYSTERECTOMY  1984  . BREAST EXCISIONAL BIOPSY Right   . BREAST SURGERY     rt br cystx2  . CORONARY ARTERY BYPASS GRAFT  2008  . KNEE SURGERY  2009   lt   . ORIF ANKLE FRACTURE Right 07/12/2012   Procedure: OPEN REDUCTION INTERNAL FIXATION (ORIF) RIGHT ANKLE FRACTURE;  Surgeon: Alta Corning, MD;  Location: [facility];  Service: Orthopedics;  Laterality: Right;  . SHOULDER SURGERY  11/08/2016  . TUBAL LIGATION    . WRIST SURGERY Left    Family History  Problem Relation Age of Onset  . Heart disease Mother   . Hyperlipidemia Mother   . Hypertension Mother   . Heart disease Father   . Stroke Father   . Hypertension Father   . Hyperlipidemia Father   . Hypertension Sister   . Hypertension Brother   . Cancer Daughter        unknown  . Hypertension Maternal Grandmother   . Hypertension Maternal Grandfather   . Cancer Maternal Grandfather   . Hypertension Paternal Grandmother   . Hypertension Paternal Grandfather   . Cancer Maternal Aunt        breast and ovarian  . Cancer Paternal Aunt        breast  . Colon cancer Neg Hx    Social History   Socioeconomic History  . Marital status: Married    Spouse name: None  . Number of children: 2  . Years of education: None  . Highest education level: 11th grade  Social Needs  . Financial resource strain: Not hard at all  . Food insecurity - worry: Never true  . Food insecurity - inability: Never true  . Transportation needs - medical: No  . Transportation needs -  non-medical: No  Occupational History  . None  Tobacco Use  . Smoking status: Current Every Day Smoker    Packs/day: 1.00    Years: 33.00    Pack years: 33.00    Types: Cigarettes  . Smokeless tobacco: Never Used  Substance and Sexual Activity  . Alcohol use: No  . Drug use: No  . Sexual activity: None  Other Topics Concern  . None  Social History Narrative  . None  Tobacco Counseling Ready to quit: Not Answered Counseling given: Not Answered   Clinical Intake:  Pre-visit preparation completed: Yes  Pain : 0-10 Pain Score: 7  Pain Type: Chronic pain Pain Location: Back Pain Descriptors / Indicators: Aching Pain Onset: More than a month ago Pain Frequency: Intermittent     Nutritional Status: BMI 25 -29 Overweight Nutritional Risks: None Diabetes: Yes CBG done?: No Did pt. bring in CBG monitor from home?: No  How often do you need to have someone help you when you read instructions, pamphlets, or other written materials from your doctor or pharmacy?: 1 - Never What is the last grade level you completed in school?: 11th grade  Interpreter Needed?: No  Information entered by :: Andrez Grime, LPN   Activities of Daily Living In your present state of health, do you have any difficulty performing the following activities: 05/24/2017  Hearing? N  Vision? Y  Comment patient has cataract in right eye  Difficulty concentrating or making decisions? Y  Comment trouble remembering small things  Walking or climbing stairs? Y  Comment Patient has trouble coming down stairs  Dressing or bathing? N  Doing errands, shopping? N  Preparing Food and eating ? N  Using the Toilet? N  In the past six months, have you accidently leaked urine? N  Do you have problems with loss of bowel control? N  Managing your Medications? N  Managing your Finances? N  Housekeeping or managing your Housekeeping? N  Some recent data might be hidden     Immunizations and Health  Maintenance Immunization History  Administered Date(s) Administered  . Influenza, Seasonal, Injecte, Preservative Fre 06/21/2012  . Influenza,inj,Quad PF,6+ Mos 01/31/2013, 01/13/2014, 02/01/2016  . Influenza-Unspecified 07/07/2015, 03/05/2017  . Pneumococcal Conjugate-13 02/02/2016  . Pneumococcal Polysaccharide-23 03/13/2014  . Tdap 08/19/2011   There are no preventive care reminders to display for this patient.  Patient Care Team: Shawnee Knapp, MD as PCP - General (Family Medicine) Phylliss Bob, MD as Consulting Physician (Orthopedic Surgery) Renato Shin, MD as Consulting Physician (Endocrinology) Nahser, Wonda Cheng, MD as Consulting Physician (Cardiology) [facility])  Indicate any recent Medical Services you may have received from other than [facility] providers in the past year (date may be approximate).     Assessment:   This is a routine wellness examination for [facility].  Hearing/Vision screen Hearing Screening Comments: Patient has not had a hearing exam.  Vision Screening Comments: Patient goes once a year to [facility] for routine eye exams.   Dietary issues and exercise activities discussed: Current Exercise Habits: The patient does not participate in regular exercise at present, Exercise limited by: None identified  Goals    . DIET - EAT MORE FRUITS AND VEGETABLES     Patient states that she would like to try to eat a more healthier diet and work on losing some weight.       Depression Screen PHQ 2/9 Scores 05/24/2017 05/10/2017 05/10/2017 03/24/2017 01/18/2017 11/22/2016 10/05/2016  PHQ - 2 Score 4 0 0 0 0 1 0  PHQ- 9 Score 13 - - - - - -    Fall Risk Fall Risk  05/24/2017 05/10/2017 05/10/2017 03/24/2017 01/18/2017  Falls in the past year? Yes No No Yes Yes  Number falls in past yr: 2 or more - - 1 2 or more  Injury with Fall? Yes - - - Yes  Comment surgery on shoulder - - - -  Risk Factor Category  - - - - -  Comment - - - - -  Risk for fall  due to : History of fall(s) - - - -  Follow up Falls prevention discussed - - - -    Is the patient's home free of loose throw rugs in walkways, pet beds, electrical cords, etc?   yes      Grab bars in the bathroom? yes      Handrails on the stairs?   yes      Adequate lighting?   yes  Timed Get Up and Go Performed yes, completed within 30 seconds  Cognitive Function:     6CIT Screen 05/24/2017  What Year? 0 points  What month? 0 points  What time? 0 points  Count back from 20 0 points  Months in reverse 0 points  Repeat phrase 0 points  Total Score 0    Screening Tests Health Maintenance  Topic Date Due  . HEMOGLOBIN A1C  07/30/2017  . OPHTHALMOLOGY EXAM  09/06/2017  . URINE MICROALBUMIN  10/05/2017  . FOOT EXAM  01/29/2018  . MAMMOGRAM  07/05/2018  . COLONOSCOPY  09/11/2018  . PNEUMOCOCCAL POLYSACCHARIDE VACCINE (2) 03/14/2019  . TETANUS/TDAP  08/18/2021  . INFLUENZA VACCINE  Completed  . Hepatitis C Screening  Completed  . HIV Screening  Completed    Qualifies for Shingles Vaccine? Advised patient to check with pharmacy about receiving the Shingrix vaccine   Cancer Screenings: Lung: Low Dose CT Chest recommended if Age 42-80 years, 30 pack-year currently smoking OR have quit w/in 15years. Patient does qualify. CT scan chest completed 04/11/2017 Breast: Up to date on Mammogram? Yes, completed 07/04/2016 Up to date of Bone Density/Dexa? N/A, Starts at age 60  Colorectal: Colonoscopy completed 09/10/2013  Additional Screenings:  Hepatitis B/HIV/Syphillis:HIV completed 12/08/2012, Hep B and Syphillis not indicated  Hepatitis C Screening: completed 04/26/2010     Plan:   I have personally reviewed and noted the following in the patient's chart:   . Medical and social history . Use of alcohol, tobacco or illicit drugs  . Current medications and supplements . Functional ability and status . Nutritional status . Physical activity . Advanced directives . List of  other physicians . Hospitalizations, surgeries, and ER visits in previous 12 months . Vitals . Screenings to include cognitive, depression, and falls . Referrals and appointments  In addition, I have reviewed and discussed with patient certain preventive protocols, quality metrics, and best practice recommendations. A written personalized care plan for preventive services as well as general preventive health recommendations were provided to patient.   1. Encounter for Medicare annual wellness exam   Andrez Grime, LPN   [id number]

## 2017-05-24 NOTE — Patient Instructions (Addendum)
[name] , Thank you for taking time to come for your Medicare Wellness Visit. I appreciate your ongoing commitment to your health goals. Please review the following plan we discussed and let me know if I can assist you in the future.   Screening recommendations/referrals: Colonoscopy: up to date, next due 09/11/2018 (every 5 years you state)  Mammogram: up to date, next due 07/05/2018 Bone Density: starts at age 56 Recommended yearly ophthalmology/optometry visit for glaucoma screening and checkup Recommended yearly dental visit for hygiene and checkup  Vaccinations: Influenza vaccine: up to date Pneumococcal vaccine: up to date Tdap vaccine: up to date, next due 08/18/2021 Shingles vaccine: Check with your pharmacy about receiving the Shingrix vaccine    Advanced directives: Advance directive discussed with you today. Even though you declined this today please call our office should you change your mind and we can give you the proper paperwork for you to fill out.   Conditions/risks identified:  Try to eat a more healthier diet and work on losing some weight.   Next appointment: schedule follow up visit with PCP, next Medicare Wellness visit is 05/27/2018 @ 10:20 am with Nurse Health Advisor.   Preventive Care 40-64 Years, Female Preventive care refers to lifestyle choices and visits with your health care provider that can promote health and wellness. What does preventive care include?  A yearly physical exam. This is also called an annual well check.  Dental exams once or twice a year.  Routine eye exams. Ask your health care provider how often you should have your eyes checked.  Personal lifestyle choices, including:  Daily care of your teeth and gums.  Regular physical activity.  Eating a healthy diet.  Avoiding tobacco and drug use.  Limiting alcohol use.  Practicing safe sex.  Taking low-dose aspirin daily starting at age 78.  Taking vitamin and mineral  supplements as recommended by your health care provider. What happens during an annual well check? The services and screenings done by your health care provider during your annual well check will depend on your age, overall health, lifestyle risk factors, and family history of disease. Counseling  Your health care provider may ask you questions about your:  Alcohol use.  Tobacco use.  Drug use.  Emotional well-being.  Home and relationship well-being.  Sexual activity.  Eating habits.  Work and work Statistician.  Method of birth control.  Menstrual cycle.  Pregnancy history. Screening  You may have the following tests or measurements:  Height, weight, and BMI.  Blood pressure.  Lipid and cholesterol levels. These may be checked every 5 years, or more frequently if you are over 58 years old.  Skin check.  Lung cancer screening. You may have this screening every year starting at age 30 if you have a 30-pack-year history of smoking and currently smoke or have quit within the past 15 years.  Fecal occult blood test (FOBT) of the stool. You may have this test every year starting at age 66.  Flexible sigmoidoscopy or colonoscopy. You may have a sigmoidoscopy every 5 years or a colonoscopy every 10 years starting at age 51.  Hepatitis C blood test.  Hepatitis B blood test.  Sexually transmitted disease (STD) testing.  Diabetes screening. This is done by checking your blood sugar (glucose) after you have not eaten for a while (fasting). You may have this done every 1-3 years.  Mammogram. This may be done every 1-2 years. Talk to your health care provider about when you  should start having regular mammograms. This may depend on whether you have a family history of breast cancer.  BRCA-related cancer screening. This may be done if you have a family history of breast, ovarian, tubal, or peritoneal cancers.  Pelvic exam and Pap test. This may be done every 3 years starting  at age 39. Starting at age 77, this may be done every 5 years if you have a Pap test in combination with an HPV test.  Bone density scan. This is done to screen for osteoporosis. You may have this scan if you are at high risk for osteoporosis. Discuss your test results, treatment options, and if necessary, the need for more tests with your health care provider. Vaccines  Your health care provider may recommend certain vaccines, such as:  Influenza vaccine. This is recommended every year.  Tetanus, diphtheria, and acellular pertussis (Tdap, Td) vaccine. You may need a Td booster every 10 years.  Zoster vaccine. You may need this after age 73.  Pneumococcal 13-valent conjugate (PCV13) vaccine. You may need this if you have certain conditions and were not previously vaccinated.  Pneumococcal polysaccharide (PPSV23) vaccine. You may need one or two doses if you smoke cigarettes or if you have certain conditions. Talk to your health care provider about which screenings and vaccines you need and how often you need them. This information is not intended to replace advice given to you by your health care provider. Make sure you discuss any questions you have with your health care provider. Document Released: 05/14/2015 Document Revised: 01/05/2016 Document Reviewed: 02/16/2015 Elsevier Interactive Patient Education  2017 [place] Prevention in the Home Falls can cause injuries. They can happen to people of all ages. There are many things you can do to make your home safe and to help prevent falls. What can I do on the outside of my home?  Regularly fix the edges of walkways and driveways and fix any cracks.  Remove anything that might make you trip as you walk through a door, such as a raised step or threshold.  Trim any bushes or trees on the path to your home.  Use bright outdoor lighting.  Clear any walking paths of anything that might make someone trip, such as rocks or  tools.  Regularly check to see if handrails are loose or broken. Make sure that both sides of any steps have handrails.  Any raised decks and porches should have guardrails on the edges.  Have any leaves, snow, or ice cleared regularly.  Use sand or salt on walking paths during winter.  Clean up any spills in your garage right away. This includes oil or grease spills. What can I do in the bathroom?  Use night lights.  Install grab bars by the toilet and in the tub and shower. Do not use towel bars as grab bars.  Use non-skid mats or decals in the tub or shower.  If you need to sit down in the shower, use a plastic, non-slip stool.  Keep the floor dry. Clean up any water that spills on the floor as soon as it happens.  Remove soap buildup in the tub or shower regularly.  Attach bath mats securely with double-sided non-slip rug tape.  Do not have throw rugs and other things on the floor that can make you trip. What can I do in the bedroom?  Use night lights.  Make sure that you have a light by your bed that is  easy to reach.  Do not use any sheets or blankets that are too big for your bed. They should not hang down onto the floor.  Have a firm chair that has side arms. You can use this for support while you get dressed.  Do not have throw rugs and other things on the floor that can make you trip. What can I do in the kitchen?  Clean up any spills right away.  Avoid walking on wet floors.  Keep items that you use a lot in easy-to-reach places.  If you need to reach something above you, use a strong step stool that has a grab bar.  Keep electrical cords out of the way.  Do not use floor polish or wax that makes floors slippery. If you must use wax, use non-skid floor wax.  Do not have throw rugs and other things on the floor that can make you trip. What can I do with my stairs?  Do not leave any items on the stairs.  Make sure that there are handrails on both  sides of the stairs and use them. Fix handrails that are broken or loose. Make sure that handrails are as long as the stairways.  Check any carpeting to make sure that it is firmly attached to the stairs. Fix any carpet that is loose or worn.  Avoid having throw rugs at the top or bottom of the stairs. If you do have throw rugs, attach them to the floor with carpet tape.  Make sure that you have a light switch at the top of the stairs and the bottom of the stairs. If you do not have them, ask someone to add them for you. What else can I do to help prevent falls?  Wear shoes that:  Do not have high heels.  Have rubber bottoms.  Are comfortable and fit you well.  Are closed at the toe. Do not wear sandals.  If you use a stepladder:  Make sure that it is fully opened. Do not climb a closed stepladder.  Make sure that both sides of the stepladder are locked into place.  Ask someone to hold it for you, if possible.  Clearly mark and make sure that you can see:  Any grab bars or handrails.  First and last steps.  Where the edge of each step is.  Use tools that help you move around (mobility aids) if they are needed. These include:  Canes.  Walkers.  Scooters.  Crutches.  Turn on the lights when you go into a dark area. Replace any light bulbs as soon as they burn out.  Set up your furniture so you have a clear path. Avoid moving your furniture around.  If any of your floors are uneven, fix them.  If there are any pets around you, be aware of where they are.  Review your medicines with your doctor. Some medicines can make you feel dizzy. This can increase your chance of falling. Ask your doctor what other things that you can do to help prevent falls. This information is not intended to replace advice given to you by your health care provider. Make sure you discuss any questions you have with your health care provider. Document Released: 02/11/2009 Document Revised:  09/23/2015 Document Reviewed: 05/22/2014 Elsevier Interactive Patient Education  2017 [employer].

## 2017-06-01 ENCOUNTER — Other Ambulatory Visit: Payer: Self-pay

## 2017-06-01 ENCOUNTER — Encounter: Payer: Self-pay | Admitting: Endocrinology

## 2017-06-01 ENCOUNTER — Ambulatory Visit: Payer: Medicare HMO | Admitting: Endocrinology

## 2017-06-01 VITALS — BP 112/62 | HR 65 | Wt 154.2 lb

## 2017-06-01 DIAGNOSIS — E890 Postprocedural hypothyroidism: Secondary | ICD-10-CM

## 2017-06-01 DIAGNOSIS — E1142 Type 2 diabetes mellitus with diabetic polyneuropathy: Secondary | ICD-10-CM | POA: Diagnosis not present

## 2017-06-01 DIAGNOSIS — E1151 Type 2 diabetes mellitus with diabetic peripheral angiopathy without gangrene: Secondary | ICD-10-CM | POA: Diagnosis not present

## 2017-06-01 LAB — TSH: TSH: 2.18 u[IU]/mL (ref 0.35–4.50)

## 2017-06-01 LAB — POCT GLYCOSYLATED HEMOGLOBIN (HGB A1C): Hemoglobin A1C: 6.3

## 2017-06-01 MED ORDER — BROMOCRIPTINE MESYLATE 2.5 MG PO TABS: 1.2500 mg | ORAL_TABLET | Freq: Every day | ORAL | 3 refills | Status: DC

## 2017-06-01 NOTE — Patient Instructions (Signed)
Please continue the same medications blood tests are requested for you today.  We'll let you know about the results. Please come back for a follow-up appointment in 6 months.

## 2017-06-01 NOTE — Progress Notes (Signed)
Subjective:    Patient ID: [name], female    DOB: November 12, 1960, 57 y.o.   MRN: [id number]  HPI Pt has multinodular goiter (dx'ed 2014; it was found on [place], which had been done to investigate lymphadenopathy of the neck; the nodes appeared benign, but goiter was incidentally noted; bx was c/w benign follicular nodule; she was noted in early 2015 to have suppressed TSH, and she received RAI in April of 2015; in late 2015, she was noted to have elevated TSH, and was rx'ed synthroid).   She does not notice any nodule in the neck.   Pt also returns for f/u of diabetes mellitus:  DM type: 2 Dx'ed: 2549 Complications: polyneuropathy, PAD, and CAD.   Therapy: 4 oral meds GDM: never DKA: never Severe hypoglycemia: never Pancreatitis: never Other: She has never been on insulin; she did not tolerate invokana (nausea).   Interval history: only symptom is depression, due to dtr's recent death.  Pt states cbg's are well-controlled. Past Medical History:  Diagnosis Date  . Allergy   . Anxiety   . Arthritis   . Back pain, chronic   . Bipolar disorder ([place])   . Bronchitis    hx-no inhalers at home  . Carotid bruit   . Carpal tunnel syndrome on both sides   . Coronary artery disease   . DDD (degenerative disc disease), lumbar   . Depression   . Diabetes mellitus    diet controlled  . Ejection fraction    .  Marland Kitchen GERD (gastroesophageal reflux disease)   . Hx of CABG   . Hypercholesteremia   . Hypertension   . Hypothyroidism   . Multiple thyroid nodules   . Myocardial infarction ([place]) 11/20/06  . Neuromuscular disorder ([place])   . Nodule of neck     Past Surgical History:  Procedure Laterality Date  . ABDOMINAL HYSTERECTOMY  1984  . BREAST EXCISIONAL BIOPSY Right   . BREAST SURGERY     rt br cystx2  . CORONARY ARTERY BYPASS GRAFT  2008  . KNEE SURGERY  2009   lt   . ORIF ANKLE FRACTURE Right 07/12/2012   Procedure: OPEN REDUCTION INTERNAL FIXATION (ORIF) RIGHT ANKLE  FRACTURE;  Surgeon: Alta Corning, MD;  Location: [facility];  Service: Orthopedics;  Laterality: Right;  . SHOULDER SURGERY  11/08/2016  . TUBAL LIGATION    . WRIST SURGERY Left     Social History   Socioeconomic History  . Marital status: Married    Spouse name: Not on file  . Number of children: 2  . Years of education: Not on file  . Highest education level: 11th grade  Social Needs  . Financial resource strain: Not hard at all  . Food insecurity - worry: Never true  . Food insecurity - inability: Never true  . Transportation needs - medical: No  . Transportation needs - non-medical: No  Occupational History  . Not on file  Tobacco Use  . Smoking status: Current Every Day Smoker    Packs/day: 1.00    Years: 33.00    Pack years: 33.00    Types: Cigarettes  . Smokeless tobacco: Never Used  Substance and Sexual Activity  . Alcohol use: No  . Drug use: No  . Sexual activity: Not on file  Other Topics Concern  . Not on file  Social History Narrative  . Not on file    Current Outpatient Medications on File Prior to Visit  Medication  Sig Dispense Refill  . acarbose (PRECOSE) 25 MG tablet Take 1 tablet (25 mg total) by mouth 3 (three) times daily with meals. 270 tablet 3  . ACCU-CHEK FASTCLIX LANCETS MISC Use to check blood sugar 3 times daily. 306 each 1  . aspirin EC 81 MG tablet Take 81 mg by mouth 2 (two) times daily.    . Bisacodyl (DULCOLAX PO) Take by mouth.    . Blood Glucose Monitoring Suppl (ACCU-CHEK GUIDE) w/Device KIT 1 each by Does not apply route 3 (three) times daily. 1 kit 1  . carvedilol (COREG) 3.125 MG tablet TAKE 1 TABLET (3.125 MG TOTAL) BY MOUTH 2 (TWO) TIMES DAILY. 180 tablet 3  . diazepam (VALIUM) 5 MG tablet Take 1-2 tablets (5-10 mg total) by mouth every 8 (eight) hours as needed for anxiety or muscle spasms. Make sure to separate this by 4 hours from any narcotic such as the oxycodone. 60 tablet 0  . DULoxetine (CYMBALTA) 60 MG  capsule Take 60 mg by mouth 2 (two) times daily.    . empagliflozin (JARDIANCE) 10 MG TABS tablet Take 10 mg by mouth daily. 30 tablet 11  . fenofibrate (TRICOR) 145 MG tablet Take 1 tablet (145 mg total) by mouth daily. 90 tablet 3  . fluticasone (FLONASE) 50 MCG/ACT nasal spray Place 2 sprays into both nostrils at bedtime. 16 g 2  . gabapentin (NEURONTIN) 800 MG tablet Take 1 tablet (800 mg total) by mouth 4 (four) times daily. 360 tablet 1  . glucose blood (ACCU-CHEK GUIDE) test strip Use to check blood sugar 3 times daily. 300 each 1  . Ipratropium-Albuterol (COMBIVENT RESPIMAT) 20-100 MCG/ACT AERS respimat Inhale 1 puff into the lungs every 6 (six) hours. 4 g 11  . levothyroxine (SYNTHROID, LEVOTHROID) 100 MCG tablet Take 1 tablet (100 mcg total) by mouth daily before breakfast. 30 tablet 11  . metFORMIN (GLUCOPHAGE) 1000 MG tablet Take 1 tablet (1,000 mg total) daily with breakfast by mouth. 90 tablet 1  . Omega-3 Fatty Acids (FISH OIL) 1000 MG CAPS Take by mouth.    Marland Kitchen omeprazole (PRILOSEC) 40 MG capsule TAKE 1 CAPSULE DAILY 30 MINUTES BEFORE A MEAL 90 capsule 3  . oxyCODONE-acetaminophen (PERCOCET) 10-325 MG tablet Take 1 tablet by mouth every 4 (four) hours as needed for pain.    Marland Kitchen QUEtiapine (SEROQUEL) 200 MG tablet Take 200 mg by mouth 3 (three) times daily.    . ranitidine (ZANTAC) 150 MG tablet Take 1 tablet (150 mg total) by mouth 2 (two) times daily. 180 tablet 3  . rosuvastatin (CRESTOR) 20 MG tablet TAKE 1 TABLET (20 MG TOTAL) BY MOUTH DAILY. 90 tablet 0  . tiZANidine (ZANAFLEX) 4 MG tablet Take 4 mg by mouth every 6 (six) hours as needed for muscle spasms.    . traZODone (DESYREL) 100 MG tablet Take 1 tablet (100 mg total) by mouth at bedtime as needed for sleep. 30 tablet 2   No current facility-administered medications on file prior to visit.     Allergies  Allergen Reactions  . Coconut Flavor Swelling and Rash  . Ibuprofen Swelling and Rash  . Prednisone Swelling and Rash   . Cyclobenzaprine   . Morphine Nausea And Vomiting    Family History  Problem Relation Age of Onset  . Heart disease Mother   . Hyperlipidemia Mother   . Hypertension Mother   . Heart disease Father   . Stroke Father   . Hypertension Father   . Hyperlipidemia Father   .  Hypertension Sister   . Hypertension Brother   . Cancer Daughter        unknown  . Hypertension Maternal Grandmother   . Hypertension Maternal Grandfather   . Cancer Maternal Grandfather   . Hypertension Paternal Grandmother   . Hypertension Paternal Grandfather   . Cancer Maternal Aunt        breast and ovarian  . Cancer Paternal Aunt        breast  . Colon cancer Neg Hx     BP 112/62 (BP Location: Left Arm, Patient Position: Sitting, Cuff Size: Normal)   Pulse 65   Wt 154 lb 3.2 oz (69.9 kg)   SpO2 96%   BMI 27.32 kg/m    Review of Systems She has gained a few lbs.      Objective:   Physical Exam VITAL SIGNS:  See vs page GENERAL: no distress NECK: thyroid is again noted to be several times normal size, with multinodular surface.  Pulses: dorsalis pedis intact bilat.   MSK: no deformity of the feet CV: no leg edema.  Skin:  no ulcer on the feet.  normal temp on the feet.  Large port-wine area on the right leg and foot. Neuro: sensation is intact to touch on the feet, but decreased from normal.  Lab Results  Component Value Date   HGBA1C 6.3 06/01/2017       Assessment & Plan:  Goiter: we discussed: she declines f/u US Type 2 DM: well-controlled  Patient Instructions  Please continue the same medications blood tests are requested for you today.  We'll let you know about the results. Please come back for a follow-up appointment in 6 months.

## 2017-06-06 DIAGNOSIS — M533 Sacrococcygeal disorders, not elsewhere classified: Secondary | ICD-10-CM | POA: Diagnosis not present

## 2017-06-06 DIAGNOSIS — M47816 Spondylosis without myelopathy or radiculopathy, lumbar region: Secondary | ICD-10-CM | POA: Diagnosis not present

## 2017-06-06 DIAGNOSIS — Z79899 Other long term (current) drug therapy: Secondary | ICD-10-CM | POA: Diagnosis not present

## 2017-06-06 DIAGNOSIS — Z5181 Encounter for therapeutic drug level monitoring: Secondary | ICD-10-CM | POA: Diagnosis not present

## 2017-06-06 DIAGNOSIS — G894 Chronic pain syndrome: Secondary | ICD-10-CM | POA: Diagnosis not present

## 2017-06-06 DIAGNOSIS — M5441 Lumbago with sciatica, right side: Secondary | ICD-10-CM | POA: Diagnosis not present

## 2017-06-11 ENCOUNTER — Other Ambulatory Visit: Payer: Self-pay

## 2017-06-11 ENCOUNTER — Encounter: Payer: Self-pay | Admitting: Family Medicine

## 2017-06-11 ENCOUNTER — Ambulatory Visit (INDEPENDENT_AMBULATORY_CARE_PROVIDER_SITE_OTHER): Payer: Medicare HMO | Admitting: Family Medicine

## 2017-06-11 VITALS — BP 110/60 | HR 65 | Temp 98.7°F | Resp 18 | Ht 63.0 in | Wt 156.4 lb

## 2017-06-11 DIAGNOSIS — E7800 Pure hypercholesterolemia, unspecified: Secondary | ICD-10-CM

## 2017-06-11 DIAGNOSIS — J209 Acute bronchitis, unspecified: Secondary | ICD-10-CM

## 2017-06-11 DIAGNOSIS — J4520 Mild intermittent asthma, uncomplicated: Secondary | ICD-10-CM

## 2017-06-11 DIAGNOSIS — M5440 Lumbago with sciatica, unspecified side: Secondary | ICD-10-CM

## 2017-06-11 DIAGNOSIS — G8929 Other chronic pain: Secondary | ICD-10-CM

## 2017-06-11 MED ORDER — PROMETHAZINE-DM 6.25-15 MG/5ML PO SYRP: 5.0000 mL | ORAL_SOLUTION | Freq: Four times a day (QID) | ORAL | 0 refills | Status: DC | PRN

## 2017-06-11 MED ORDER — BENZONATATE 200 MG PO CAPS: 200.0000 mg | ORAL_CAPSULE | Freq: Three times a day (TID) | ORAL | 0 refills | Status: DC | PRN

## 2017-06-11 MED ORDER — GUAIFENESIN ER 1200 MG PO TB12
1.0000 | ORAL_TABLET | Freq: Two times a day (BID) | ORAL | 1 refills | Status: DC | PRN
Start: 2017-06-11 — End: 2018-05-06

## 2017-06-11 MED ORDER — AZITHROMYCIN 250 MG PO TABS: ORAL_TABLET | ORAL | 0 refills | Status: DC

## 2017-06-11 MED ORDER — ROSUVASTATIN CALCIUM 20 MG PO TABS: 20.0000 mg | ORAL_TABLET | Freq: Every day | ORAL | 1 refills | Status: DC

## 2017-06-11 NOTE — Progress Notes (Signed)
Subjective:  By signing my name below, I, [name], attest that this documentation has been prepared under the direction and in the presence of [name], MD. Electronically Signed: [name], Learned. 06/11/2017 , 6:00 PM .  Patient was seen in Room 3 .   Patient ID: [name], female    DOB: May 06, 1960, 57 y.o.   MRN: [id number] Chief Complaint  Patient presents with  . Cough    x1 week, pt states she coughing a lot and hard to the point that she can't catch her breathe. Pt states she is coughing up thick, clear mucous. Pt states she hasn't been taking anything OTC   HPI [name] is a 57 y.o. female who presents to [facility] Care at [facility] complaining of productive cough that started a week ago. Patient states that her children had the flu. She had a low grade fever. She hasn't been able to catch a breath in between her coughing fits. She also notes unable to sleep due to cough at night. She hasn't taken any OTC medications because it'll affect her BP. She's been using her albuterol inhaler when she can't catch a breath with her coughs. She's also been coughing up occasional thick clear mucus. She states having associated headache. She's been able to eat and drink. She's also used a nasal spray.   She's followed by [facility] in [place].   Past Medical History:  Diagnosis Date  . Allergy   . Anxiety   . Arthritis   . Back pain, chronic   . Bipolar disorder ([place])   . Bronchitis    hx-no inhalers at home  . Carotid bruit   . Carpal tunnel syndrome on both sides   . Coronary artery disease   . DDD (degenerative disc disease), lumbar   . Depression   . Diabetes mellitus    diet controlled  . Ejection fraction    .  Marland Kitchen GERD (gastroesophageal reflux disease)   . Hx of CABG   . Hypercholesteremia   . Hypertension   . Hypothyroidism   . Multiple thyroid nodules   . Myocardial infarction ([place]) 11/20/06  . Neuromuscular disorder ([place])     . Nodule of neck    Past Surgical History:  Procedure Laterality Date  . ABDOMINAL HYSTERECTOMY  1984  . BREAST EXCISIONAL BIOPSY Right   . BREAST SURGERY     rt br cystx2  . CORONARY ARTERY BYPASS GRAFT  2008  . KNEE SURGERY  2009   lt   . ORIF ANKLE FRACTURE Right 07/12/2012   Procedure: OPEN REDUCTION INTERNAL FIXATION (ORIF) RIGHT ANKLE FRACTURE;  Surgeon: Alta Corning, MD;  Location: [facility];  Service: Orthopedics;  Laterality: Right;  . SHOULDER SURGERY  11/08/2016  . TUBAL LIGATION    . WRIST SURGERY Left    Prior to Admission medications   Medication Sig Start Date End Date Taking? Authorizing Provider  acarbose (PRECOSE) 25 MG tablet Take 1 tablet (25 mg total) by mouth 3 (three) times daily with meals. 01/29/17   Renato Shin, MD  ACCU-CHEK FASTCLIX LANCETS MISC Use to check blood sugar 3 times daily. 08/15/16   Renato Shin, MD  aspirin EC 81 MG tablet Take 81 mg by mouth 2 (two) times daily.    [provider]  Bisacodyl (DULCOLAX PO) Take by mouth.    [provider]  Blood Glucose Monitoring Suppl (ACCU-CHEK GUIDE) w/Device KIT 1 each by Does not apply  route 3 (three) times daily. 08/15/16   Renato Shin, MD  bromocriptine (PARLODEL) 2.5 MG tablet Take 0.5 tablets (1.25 mg total) by mouth daily. 06/01/17   Renato Shin, MD  carvedilol (COREG) 3.125 MG tablet TAKE 1 TABLET (3.125 MG TOTAL) BY MOUTH 2 (TWO) TIMES DAILY. 10/05/16   Shawnee Knapp, MD  diazepam (VALIUM) 5 MG tablet Take 1-2 tablets (5-10 mg total) by mouth every 8 (eight) hours as needed for anxiety or muscle spasms. Make sure to separate this by 4 hours from any narcotic such as the oxycodone. 11/23/16   Shawnee Knapp, MD  DULoxetine (CYMBALTA) 60 MG capsule Take 60 mg by mouth 2 (two) times daily.    [provider]  empagliflozin (JARDIANCE) 10 MG TABS tablet Take 10 mg by mouth daily. 06/02/16   Renato Shin, MD  fenofibrate (TRICOR) 145 MG tablet Take 1 tablet (145 mg  total) by mouth daily. 01/03/17   Nahser, Wonda Cheng, MD  fluticasone (FLONASE) 50 MCG/ACT nasal spray Place 2 sprays into both nostrils at bedtime. 03/24/17   Shawnee Knapp, MD  gabapentin (NEURONTIN) 800 MG tablet Take 1 tablet (800 mg total) by mouth 4 (four) times daily. 03/24/17   Shawnee Knapp, MD  glucose blood (ACCU-CHEK GUIDE) test strip Use to check blood sugar 3 times daily. 08/15/16   Renato Shin, MD  Ipratropium-Albuterol (COMBIVENT RESPIMAT) 20-100 MCG/ACT AERS respimat Inhale 1 puff into the lungs every 6 (six) hours. 05/10/17   Shawnee Knapp, MD  levothyroxine (SYNTHROID, LEVOTHROID) 100 MCG tablet Take 1 tablet (100 mcg total) by mouth daily before breakfast. 01/29/17   Renato Shin, MD  metFORMIN (GLUCOPHAGE) 1000 MG tablet Take 1 tablet (1,000 mg total) daily with breakfast by mouth. 03/16/17   Renato Shin, MD  Omega-3 Fatty Acids (FISH OIL) 1000 MG CAPS Take by mouth.    [provider]  omeprazole (PRILOSEC) 40 MG capsule TAKE 1 CAPSULE DAILY 30 MINUTES BEFORE A MEAL 05/10/17   Shawnee Knapp, MD  oxyCODONE-acetaminophen (PERCOCET) 10-325 MG tablet Take 1 tablet by mouth every 4 (four) hours as needed for pain.    [provider]  QUEtiapine (SEROQUEL) 200 MG tablet Take 200 mg by mouth 3 (three) times daily.    [provider]  ranitidine (ZANTAC) 150 MG tablet Take 1 tablet (150 mg total) by mouth 2 (two) times daily. 05/10/17   Shawnee Knapp, MD  rosuvastatin (CRESTOR) 20 MG tablet TAKE 1 TABLET (20 MG TOTAL) BY MOUTH DAILY. 02/28/17   Shawnee Knapp, MD  tiZANidine (ZANAFLEX) 4 MG tablet Take 4 mg by mouth every 6 (six) hours as needed for muscle spasms.    [provider]  traZODone (DESYREL) 100 MG tablet Take 1 tablet (100 mg total) by mouth at bedtime as needed for sleep. 11/24/16   Shawnee Knapp, MD   Allergies  Allergen Reactions  . Coconut Flavor Swelling and Rash  . Ibuprofen Swelling and Rash  . Prednisone Swelling and Rash  . Cyclobenzaprine   .  Morphine Nausea And Vomiting   Family History  Problem Relation Age of Onset  . Heart disease Mother   . Hyperlipidemia Mother   . Hypertension Mother   . Heart disease Father   . Stroke Father   . Hypertension Father   . Hyperlipidemia Father   . Hypertension Sister   . Hypertension Brother   . Cancer Daughter        unknown  .  Hypertension Maternal Grandmother   . Hypertension Maternal Grandfather   . Cancer Maternal Grandfather   . Hypertension Paternal Grandmother   . Hypertension Paternal Grandfather   . Cancer Maternal Aunt        breast and ovarian  . Cancer Paternal Aunt        breast  . Colon cancer Neg Hx    Social History   Socioeconomic History  . Marital status: Married    Spouse name: None  . Number of children: 2  . Years of education: None  . Highest education level: 11th grade  Social Needs  . Financial resource strain: Not hard at all  . Food insecurity - worry: Never true  . Food insecurity - inability: Never true  . Transportation needs - medical: No  . Transportation needs - non-medical: No  Occupational History  . None  Tobacco Use  . Smoking status: Current Every Day Smoker    Packs/day: 1.00    Years: 33.00    Pack years: 33.00    Types: Cigarettes  . Smokeless tobacco: Never Used  Substance and Sexual Activity  . Alcohol use: No  . Drug use: No  . Sexual activity: None  Other Topics Concern  . None  Social History Narrative  . None   Depression screen [facility] 2/9 06/11/2017 05/24/2017 05/10/2017 05/10/2017 03/24/2017  Decreased Interest 0 2 0 0 0  Down, Depressed, Hopeless 0 2 0 0 0  PHQ - 2 Score 0 4 0 0 0  Altered sleeping - 3 - - -  Tired, decreased energy - 3 - - -  Change in appetite - 2 - - -  Feeling bad or failure about yourself  - 0 - - -  Trouble concentrating - 1 - - -  Moving slowly or fidgety/restless - 0 - - -  Suicidal thoughts - 0 - - -  PHQ-9 Score - 13 - - -  Difficult doing work/chores - Somewhat difficult - - -     Review of Systems  Constitutional: Positive for fatigue and fever (low grade). Negative for appetite change, chills and unexpected weight change.  HENT: Positive for congestion and sinus pressure. Negative for trouble swallowing.   Respiratory: Positive for cough and shortness of breath. Negative for wheezing.   Cardiovascular: Negative for chest pain.  Gastrointestinal: Negative for constipation, diarrhea, nausea and vomiting.  Skin: Negative for rash and wound.  Neurological: Positive for headaches. Negative for dizziness and weakness.  Psychiatric/Behavioral: Positive for sleep disturbance.       Objective:   Physical Exam  Constitutional: She is oriented to person, place, and time. She appears well-developed and well-nourished. No distress.  HENT:  Head: Normocephalic and atraumatic.  There is a lot of postnasal drip present  Eyes: EOM are normal. Pupils are equal, round, and reactive to light.  Neck: Neck supple.  Cardiovascular: Normal rate, regular rhythm and normal heart sounds.  No murmur heard. Pulmonary/Chest: Effort normal and breath sounds normal. No respiratory distress. She has no wheezes.  Musculoskeletal: Normal range of motion.  Lymphadenopathy:    She has cervical adenopathy (anterior bilaterally).       Right: No supraclavicular adenopathy present.       Left: No supraclavicular adenopathy present.  Neurological: She is alert and oriented to person, place, and time.  Skin: Skin is warm and dry.  Psychiatric: She has a normal mood and affect. Her behavior is normal.  Nursing note and vitals reviewed.   BP  110/60 (BP Location: Left Arm, Patient Position: Sitting, Cuff Size: Normal)   Pulse 65   Temp 98.7 F (37.1 C) (Oral)   Resp 18   Ht 5' 3" (1.6 m)   Wt 156 lb 6.4 oz (70.9 kg)   SpO2 96%   BMI 27.71 kg/m      Assessment & Plan:   1. Acute bronchitis, unspecified organism - suspect viral and that pt will feel significantly better if we can get  good control with cough suppressants. However, did give SNAP rx for zpack in case pt develops a secondary worsening of illness.  2. Mild intermittent reactive airway disease without complication - currently not flaired but has albuterol inhaler at home if needed  3. Pure hypercholesterolemia - goal LDL <70. Last checked by cardiology 2 mos prior w/ LDL 86, non-HDL 116 but  Dr. Acie Fredrickson noted that slight increase in LDL did not warrant med change at this time. refilled crestor 20  4. Chronic low back pain with sciatica, sciatica laterality unspecified, unspecified back pain laterality - on pain contract with [facility] in [place] ordered this encounter  Medications  . rosuvastatin (CRESTOR) 20 MG tablet    Sig: Take 1 tablet (20 mg total) by mouth daily.    Dispense:  90 tablet    Refill:  1  . benzonatate (TESSALON) 200 MG capsule    Sig: Take 1 capsule (200 mg total) by mouth 3 (three) times daily as needed for cough.    Dispense:  40 capsule    Refill:  0  . promethazine-dextromethorphan (PROMETHAZINE-DM) 6.25-15 MG/5ML syrup    Sig: Take 5 mLs by mouth 4 (four) times daily as needed for cough.    Dispense:  180 mL    Refill:  0  . Guaifenesin (MUCINEX MAXIMUM STRENGTH) 1200 MG TB12    Sig: Take 1 tablet (1,200 mg total) by mouth every 12 (twelve) hours as needed.    Dispense:  14 tablet    Refill:  1  . azithromycin (ZITHROMAX) 250 MG tablet    Sig: Take 2 tabs PO x 1 dose, then 1 tab PO QD x 4 days    Dispense:  6 tablet    Refill:  0    I personally performed the services described in this documentation, which was scribed in my presence. The recorded information has been reviewed and considered, and addended by me as needed.   [name], M.D.  Primary Care at [facility] [address] [place], [place] [address] [phone] phone [phone] fax  06/14/17 1:12 AM

## 2017-06-11 NOTE — Patient Instructions (Addendum)
   IF you received an x-ray today, you will receive an invoice from [facility]. Please contact [facility] at [phone] with questions or concerns regarding your invoice.   IF you received labwork today, you will receive an invoice from [facility]. Please contact [facility] at [phone] with questions or concerns regarding your invoice.   Our billing staff will not be able to assist you with questions regarding bills from these companies.  You will be contacted with the lab results as soon as they are available. The fastest way to get your results is to activate your My Chart account. Instructions are located on the last page of this paperwork. If you have not heard from us regarding the results in 2 weeks, please contact this office.     Acute Bronchitis, Adult Acute bronchitis is sudden (acute) swelling of the air tubes (bronchi) in the lungs. Acute bronchitis causes these tubes to fill with mucus, which can make it hard to breathe. It can also cause coughing or wheezing. In adults, acute bronchitis usually goes away within 2 weeks. A cough caused by bronchitis may last up to 3 weeks. Smoking, allergies, and asthma can make the condition worse. Repeated episodes of bronchitis may cause further lung problems, such as chronic obstructive pulmonary disease (COPD). What are the causes? This condition can be caused by germs and by substances that irritate the lungs, including:  Cold and flu viruses. This condition is most often caused by the same virus that causes a cold.  Bacteria.  Exposure to tobacco smoke, dust, fumes, and air pollution.  What increases the risk? This condition is more likely to develop in people who:  Have close contact with someone with acute bronchitis.  Are exposed to lung irritants, such as tobacco smoke, dust, fumes, and vapors.  Have a weak immune system.  Have a respiratory condition such as asthma.  What are the signs or  symptoms? Symptoms of this condition include:  A cough.  Coughing up clear, yellow, or green mucus.  Wheezing.  Chest congestion.  Shortness of breath.  A fever.  Body aches.  Chills.  A sore throat.  How is this diagnosed? This condition is usually diagnosed with a physical exam. During the exam, your health care provider may order tests, such as chest X-rays, to rule out other conditions. He or she may also:  Test a sample of your mucus for bacterial infection.  Check the level of oxygen in your blood. This is done to check for pneumonia.  Do a chest X-ray or lung function testing to rule out pneumonia and other conditions.  Perform blood tests.  Your health care provider will also ask about your symptoms and medical history. How is this treated? Most cases of acute bronchitis clear up over time without treatment. Your health care provider may recommend:  Drinking more fluids. Drinking more makes your mucus thinner, which may make it easier to breathe.  Taking a medicine for a fever or cough.  Taking an antibiotic medicine.  Using an inhaler to help improve shortness of breath and to control a cough.  Using a cool mist vaporizer or humidifier to make it easier to breathe.  Follow these instructions at home: Medicines  Take over-the-counter and prescription medicines only as told by your health care provider.  If you were prescribed an antibiotic, take it as told by your health care provider. Do not stop taking the antibiotic even if you start to feel better. General instructions    Get plenty of rest.  Drink enough fluids to keep your urine clear or pale yellow.  Avoid smoking and secondhand smoke. Exposure to cigarette smoke or irritating chemicals will make bronchitis worse. If you smoke and you need help quitting, ask your health care provider. Quitting smoking will help your lungs heal faster.  Use an inhaler, cool mist vaporizer, or humidifier as told  by your health care provider.  Keep all follow-up visits as told by your health care provider. This is important. How is this prevented? To lower your risk of getting this condition again:  Wash your hands often with soap and water. If soap and water are not available, use hand sanitizer.  Avoid contact with people who have cold symptoms.  Try not to touch your hands to your mouth, nose, or eyes.  Make sure to get the flu shot every year.  Contact a health care provider if:  Your symptoms do not improve in 2 weeks of treatment. Get help right away if:  You cough up blood.  You have chest pain.  You have severe shortness of breath.  You become dehydrated.  You faint or keep feeling like you are going to faint.  You keep vomiting.  You have a severe headache.  Your fever or chills gets worse. This information is not intended to replace advice given to you by your health care provider. Make sure you discuss any questions you have with your health care provider. Document Released: 05/25/2004 Document Revised: 11/10/2015 Document Reviewed: 10/06/2015 Elsevier Interactive Patient Education  2018 [employer]   

## 2017-06-14 ENCOUNTER — Other Ambulatory Visit: Payer: Self-pay

## 2017-06-14 ENCOUNTER — Telehealth: Payer: Self-pay | Admitting: Family Medicine

## 2017-06-14 ENCOUNTER — Telehealth: Payer: Self-pay | Admitting: Endocrinology

## 2017-06-14 MED ORDER — BROMOCRIPTINE MESYLATE 0.8 MG PO TABS: 1.0000 | ORAL_TABLET | Freq: Every day | ORAL | 11 refills | Status: DC

## 2017-06-14 MED ORDER — ROSUVASTATIN CALCIUM 20 MG PO TABS: 20.0000 mg | ORAL_TABLET | Freq: Every day | ORAL | 1 refills | Status: DC

## 2017-06-14 NOTE — Telephone Encounter (Signed)
Copied from [place] [id number]. Topic: Quick Communication - See Telephone Encounter >> Jun 14, 2017 12:06 PM Percell Belt A wrote: CRM for notification. See Telephone encounter for: rosuvastatin (CRESTOR) 20 MG tablet [[id number]]   This med was suppose to go to [facility] mail order.  Per pt it went to wrong pharmacy .  Can it be resent ?     06/14/17.

## 2017-06-14 NOTE — Telephone Encounter (Signed)
Pt has received a letter in the mail from [facility] stating that the manufacturer of bromocriptine is out of it. There is no alternate being suggested. She is not aware of the formulary because she has been on other similar meds in the past. Please advise  Pt is aware that she may need to find out what else would be covered

## 2017-06-14 NOTE — Telephone Encounter (Signed)
I have sent a prescription to your pharmacy, for a different form of this.

## 2017-06-14 NOTE — Addendum Note (Signed)
Addended by: Renato Shin on: 06/14/2017 11:36 AM   Modules accepted: Orders

## 2017-06-14 NOTE — Telephone Encounter (Signed)
Pharmacy switched to [facility] mail order

## 2017-06-14 NOTE — Telephone Encounter (Signed)
I called and notified patient that new prescription was sent in.

## 2017-06-19 DIAGNOSIS — F319 Bipolar disorder, unspecified: Secondary | ICD-10-CM | POA: Diagnosis not present

## 2017-06-28 ENCOUNTER — Encounter: Payer: Self-pay | Admitting: Family Medicine

## 2017-06-29 ENCOUNTER — Encounter: Payer: Self-pay | Admitting: Family Medicine

## 2017-07-06 ENCOUNTER — Other Ambulatory Visit: Payer: Self-pay | Admitting: Endocrinology

## 2017-07-06 ENCOUNTER — Encounter: Payer: Self-pay | Admitting: Endocrinology

## 2017-07-06 ENCOUNTER — Encounter: Payer: Self-pay | Admitting: Family Medicine

## 2017-07-07 MED ORDER — CARVEDILOL 3.125 MG PO TABS: ORAL_TABLET | ORAL | 1 refills | Status: DC

## 2017-07-07 NOTE — Telephone Encounter (Signed)
Yes, that fine thanks!!

## 2017-07-28 ENCOUNTER — Other Ambulatory Visit: Payer: Self-pay | Admitting: Family Medicine

## 2017-07-30 NOTE — Telephone Encounter (Signed)
Called pt to find out who was ordering her Metformin. The refill request has the provider as Dr Loanne Drilling. The pt stated that the dose was changed and that Dr Brigitte Pulse refilled that med. Per med hx in chart it was  11/23/16.  Pt is having constipation. Normally she would have BM 2 times per day. She stated that she is going every 3-4 days and her abdomen feels bloated from the constipation. Pt is asking for the med (Relistor) to be restarted or another med for constipation prescribed.  Metformin refill Last OV: 05/10/17 Last Refill:03/16/17 Pharmacy:[facility] PCP: Dr Brigitte Pulse Last HGB A1C 6.3 on 06/01/17  Relistor refill Last OV: 05/10/17  Refill:Drug was d/c'd on 05/10/17 Pharmacy:[facility]

## 2017-07-31 NOTE — Telephone Encounter (Signed)
Sent a year's suppy of both to [place]

## 2017-08-02 DIAGNOSIS — M961 Postlaminectomy syndrome, not elsewhere classified: Secondary | ICD-10-CM | POA: Diagnosis not present

## 2017-08-02 DIAGNOSIS — M47816 Spondylosis without myelopathy or radiculopathy, lumbar region: Secondary | ICD-10-CM | POA: Diagnosis not present

## 2017-08-02 DIAGNOSIS — M5441 Lumbago with sciatica, right side: Secondary | ICD-10-CM | POA: Diagnosis not present

## 2017-08-02 DIAGNOSIS — Z5181 Encounter for therapeutic drug level monitoring: Secondary | ICD-10-CM | POA: Diagnosis not present

## 2017-08-02 DIAGNOSIS — G894 Chronic pain syndrome: Secondary | ICD-10-CM | POA: Diagnosis not present

## 2017-08-02 DIAGNOSIS — Z79899 Other long term (current) drug therapy: Secondary | ICD-10-CM | POA: Diagnosis not present

## 2017-08-07 ENCOUNTER — Other Ambulatory Visit: Payer: Self-pay | Admitting: Family Medicine

## 2017-08-07 DIAGNOSIS — Z1231 Encounter for screening mammogram for malignant neoplasm of breast: Secondary | ICD-10-CM

## 2017-08-13 ENCOUNTER — Other Ambulatory Visit: Payer: Self-pay

## 2017-08-13 ENCOUNTER — Encounter: Payer: Self-pay | Admitting: Family Medicine

## 2017-08-13 ENCOUNTER — Ambulatory Visit (INDEPENDENT_AMBULATORY_CARE_PROVIDER_SITE_OTHER): Payer: Medicare HMO | Admitting: Family Medicine

## 2017-08-13 VITALS — BP 122/68 | HR 70 | Temp 98.6°F | Resp 18 | Ht 63.0 in | Wt 156.4 lb

## 2017-08-13 DIAGNOSIS — Z1839 Other retained organic fragments: Secondary | ICD-10-CM | POA: Diagnosis not present

## 2017-08-13 DIAGNOSIS — M795 Residual foreign body in soft tissue: Secondary | ICD-10-CM

## 2017-08-13 MED ORDER — MUPIROCIN 2 % EX OINT: 1.0000 "application " | TOPICAL_OINTMENT | Freq: Four times a day (QID) | CUTANEOUS | 0 refills | Status: DC

## 2017-08-13 NOTE — Patient Instructions (Signed)
     IF you received an x-ray today, you will receive an invoice from [facility]. Please contact [facility] at [phone] with questions or concerns regarding your invoice.   IF you received labwork today, you will receive an invoice from [facility]. Please contact [facility] at [phone] with questions or concerns regarding your invoice.   Our billing staff will not be able to assist you with questions regarding bills from these companies.  You will be contacted with the lab results as soon as they are available. The fastest way to get your results is to activate your My Chart account. Instructions are located on the last page of this paperwork. If you have not heard from us regarding the results in 2 weeks, please contact this office.     

## 2017-08-13 NOTE — Progress Notes (Signed)
By signing my name below, I, Schuyler Bain, attest that this documentation has been prepared under the direction and in the presence of Dr. Laurey Arrow. Brigitte Pulse. Electronically Signed: Baldwin Jamaica, Medical Scribe 08/13/2017 at 6:08 PM.  Subjective:    Patient ID: [name], female    DOB: 16-Jul-1960, 57 y.o.   MRN: [id number] Chief Complaint  Patient presents with  . Tick Removal    pt states she found tick last night and she doesn't think she got the head.  Pt states there is a hard knot close to the groin area.    HPI [name] is a 57 y.o. female who presents to Primary Care at [facility] regarding her a tick she found on her last night - Rt upper thigh very near her groin.  No idea how long it had been there but it might have been a while.   She found it last night and her husband tried to pull it out but feel like there is something still stuck in there - thinks the head was left behind as it did NOT appears to be on the tick.   Patient Active Problem List   Diagnosis Date Noted  . Gastroesophageal reflux disease 05/13/2017  . Polycythemia, secondary 05/13/2017  . Radiculopathy 10/21/2015  . Diabetes ([place]) 07/29/2015  . 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 Multinodular goiter 09/15/2013  . Carotid bruit   . Depression   . Coronary artery disease   . Hypertension   . Hyperlipidemia   . Neuromuscular disorder ([place])   . Arthritis   . DDD (degenerative disc disease), lumbar   . Ejection fraction   . Hx of CABG   . Encounter for long-term (current) use of other medications 02/08/2012  . Spinal stenosis of lumbar region 02/08/2012  . Neuropathy 02/08/2012  . Superficial phlebitis and thrombophlebitis of the leg 04/09/2011  . Back pain, chronic   . TOBACCO ABUSE 11/20/2008   Past Medical History:  Diagnosis Date  . Allergy   . Anxiety   . Arthritis   . Back pain, chronic   . Bipolar disorder ([place])   . Bronchitis    hx-no inhalers at  home  . Carotid bruit   . Carpal tunnel syndrome on both sides   . Coronary artery disease   . DDD (degenerative disc disease), lumbar   . Depression   . Diabetes mellitus    diet controlled  . Ejection fraction    .  Marland Kitchen GERD (gastroesophageal reflux disease)   . Hx of CABG   . Hypercholesteremia   . Hypertension   . Hypothyroidism   . Multiple thyroid nodules   . Myocardial infarction ([place]) 11/20/06  . Neuromuscular disorder ([place])   . Nodule of neck    Past Surgical History:  Procedure Laterality Date  . ABDOMINAL HYSTERECTOMY  1984  . BREAST EXCISIONAL BIOPSY Right   . BREAST SURGERY     rt br cystx2  . CORONARY ARTERY BYPASS GRAFT  2008  . KNEE SURGERY  2009   lt   . ORIF ANKLE FRACTURE Right 07/12/2012   Procedure: OPEN REDUCTION INTERNAL FIXATION (ORIF) RIGHT ANKLE FRACTURE;  Surgeon: Alta Corning, MD;  Location: [facility];  Service: Orthopedics;  Laterality: Right;  . SHOULDER SURGERY  11/08/2016  . TUBAL LIGATION    . WRIST SURGERY Left    Allergies  Allergen Reactions  . Coconut Flavor Swelling and Rash  . Ibuprofen Swelling and Rash  .  Prednisone Swelling and Rash  . Cyclobenzaprine   . Morphine Nausea And Vomiting   Prior to Admission medications   Medication Sig Start Date End Date Taking? Authorizing Provider  acarbose (PRECOSE) 25 MG tablet Take 1 tablet (25 mg total) by mouth 3 (three) times daily with meals. 01/29/17  Yes Renato Shin, MD  ACCU-CHEK FASTCLIX LANCETS MISC Use to check blood sugar 3 times daily. 08/15/16  Yes Renato Shin, MD  aspirin EC 81 MG tablet Take 81 mg by mouth 2 (two) times daily.   Yes [provider]  azithromycin (ZITHROMAX) 250 MG tablet Take 2 tabs PO x 1 dose, then 1 tab PO QD x 4 days 06/11/17  Yes Shawnee Knapp, MD  benzonatate (TESSALON) 200 MG capsule Take 1 capsule (200 mg total) by mouth 3 (three) times daily as needed for cough. 06/11/17  Yes Shawnee Knapp, MD  Bisacodyl (DULCOLAX PO) Take by  mouth.   Yes [provider]  Blood Glucose Monitoring Suppl (ACCU-CHEK GUIDE) w/Device KIT 1 each by Does not apply route 3 (three) times daily. 08/15/16  Yes Renato Shin, MD  Bromocriptine Mesylate 0.8 MG TABS Take 1 tablet (0.8 mg total) by mouth daily. 06/14/17  Yes Renato Shin, MD  carvedilol (COREG) 3.125 MG tablet TAKE 1 TABLET (3.125 MG TOTAL) BY MOUTH 2 (TWO) TIMES DAILY. 07/07/17  Yes Shawnee Knapp, MD  diazepam (VALIUM) 5 MG tablet Take 1-2 tablets (5-10 mg total) by mouth every 8 (eight) hours as needed for anxiety or muscle spasms. Make sure to separate this by 4 hours from any narcotic such as the oxycodone. 11/23/16  Yes Shawnee Knapp, MD  DULoxetine (CYMBALTA) 60 MG capsule Take 60 mg by mouth 2 (two) times daily.   Yes [provider]  fenofibrate (TRICOR) 145 MG tablet Take 1 tablet (145 mg total) by mouth daily. 01/03/17  Yes Nahser, Wonda Cheng, MD  fluticasone (FLONASE) 50 MCG/ACT nasal spray Place 2 sprays into both nostrils at bedtime. 03/24/17  Yes Shawnee Knapp, MD  gabapentin (NEURONTIN) 800 MG tablet Take 1 tablet (800 mg total) by mouth 4 (four) times daily. 03/24/17  Yes Shawnee Knapp, MD  glucose blood (ACCU-CHEK GUIDE) test strip Use to check blood sugar 3 times daily. 08/15/16  Yes Renato Shin, MD  Guaifenesin [facility] MAXIMUM STRENGTH) 1200 MG TB12 Take 1 tablet (1,200 mg total) by mouth every 12 (twelve) hours as needed. 06/11/17  Yes Shawnee Knapp, MD  Ipratropium-Albuterol (COMBIVENT RESPIMAT) 20-100 MCG/ACT AERS respimat Inhale 1 puff into the lungs every 6 (six) hours. 05/10/17  Yes Shawnee Knapp, MD  JARDIANCE 10 MG TABS tablet TAKE 1 TABLET EVERY DAY 07/06/17  Yes Renato Shin, MD  levothyroxine (SYNTHROID, LEVOTHROID) 100 MCG tablet Take 1 tablet (100 mcg total) by mouth daily before breakfast. 01/29/17  Yes Renato Shin, MD  metFORMIN (GLUCOPHAGE) 1000 MG tablet TAKE 1 TABLET BY MOUTH DAILY WITH BREAKFAST. 07/31/17  Yes Shawnee Knapp, MD  Methylnaltrexone Bromide  (RELISTOR) 150 MG TABS Take 3 tablets by mouth daily. With water >30 min before first mea 07/31/17  Yes Shawnee Knapp, MD  Omega-3 Fatty Acids (FISH OIL) 1000 MG CAPS Take by mouth.   Yes [provider]  omeprazole (PRILOSEC) 40 MG capsule TAKE 1 CAPSULE DAILY 30 MINUTES BEFORE A MEAL 05/10/17  Yes Shawnee Knapp, MD  oxyCODONE-acetaminophen (PERCOCET) 10-325 MG tablet Take 1 tablet by mouth every 4 (four) hours as needed for  pain.   Yes [provider]  promethazine-dextromethorphan (PROMETHAZINE-DM) 6.25-15 MG/5ML syrup Take 5 mLs by mouth 4 (four) times daily as needed for cough. 06/11/17  Yes Shawnee Knapp, MD  QUEtiapine (SEROQUEL) 200 MG tablet Take 200 mg by mouth 3 (three) times daily.   Yes [provider]  ranitidine (ZANTAC) 150 MG tablet Take 1 tablet (150 mg total) by mouth 2 (two) times daily. 05/10/17  Yes Shawnee Knapp, MD  rosuvastatin (CRESTOR) 20 MG tablet Take 1 tablet (20 mg total) by mouth daily. 06/14/17  Yes Shawnee Knapp, MD  tiZANidine (ZANAFLEX) 4 MG tablet Take 4 mg by mouth every 6 (six) hours as needed for muscle spasms.   Yes [provider]  traZODone (DESYREL) 100 MG tablet Take 1 tablet (100 mg total) by mouth at bedtime as needed for sleep. 11/24/16  Yes Shawnee Knapp, MD   Social History   Socioeconomic History  . Marital status: Married    Spouse name: Not on file  . Number of children: 2  . Years of education: Not on file  . Highest education level: 11th grade  Occupational History  . Not on file  Social Needs  . Financial resource strain: Not hard at all  . Food insecurity:    Worry: Never true    Inability: Never true  . Transportation needs:    Medical: No    Non-medical: No  Tobacco Use  . Smoking status: Current Every Day Smoker    Packs/day: 1.00    Years: 33.00    Pack years: 33.00    Types: Cigarettes  . Smokeless tobacco: Never Used  Substance and Sexual Activity  . Alcohol use: No  . Drug use: No  . Sexual  activity: Not on file  Lifestyle  . Physical activity:    Days per week: 0 days    Minutes per session: 0 min  . Stress: To some extent  Relationships  . Social connections:    Talks on phone: Once a week    Gets together: Once a week    Attends religious service: Never    Active member of club or organization: No    Attends meetings of clubs or organizations: Never    Relationship status: Married  . Intimate partner violence:    Fear of current or ex partner: No    Emotionally abused: No    Physically abused: No    Forced sexual activity: No  Other Topics Concern  . Not on file  Social History Narrative  . Not on file   Family History  Problem Relation Age of Onset  . Heart disease Mother   . Hyperlipidemia Mother   . Hypertension Mother   . Heart disease Father   . Stroke Father   . Hypertension Father   . Hyperlipidemia Father   . Hypertension Sister   . Hypertension Brother   . Cancer Daughter        unknown  . Hypertension Maternal Grandmother   . Hypertension Maternal Grandfather   . Cancer Maternal Grandfather   . Hypertension Paternal Grandmother   . Hypertension Paternal Grandfather   . Cancer Maternal Aunt        breast and ovarian  . Cancer Paternal Aunt        breast  . Colon cancer Neg Hx    Depression screen [facility] 2/9 08/13/2017 06/11/2017 05/24/2017 05/10/2017 05/10/2017  Decreased Interest 0 0 2 0 0  Down, Depressed, Hopeless  0 0 2 0 0  PHQ - 2 Score 0 0 4 0 0  Altered sleeping - - 3 - -  Tired, decreased energy - - 3 - -  Change in appetite - - 2 - -  Feeling bad or failure about yourself  - - 0 - -  Trouble concentrating - - 1 - -  Moving slowly or fidgety/restless - - 0 - -  Suicidal thoughts - - 0 - -  PHQ-9 Score - - 13 - -  Difficult doing work/chores - - Somewhat difficult - -    Review of Systems  Constitutional: Negative for appetite change, chills, diaphoresis and fever.  Genitourinary: Negative for difficulty urinating, dysuria,  flank pain, genital sores, hematuria, pelvic pain, vaginal bleeding, vaginal discharge and vaginal pain.  Musculoskeletal: Positive for arthralgias (unchanged from baseline), back pain (unchanged from baseline) and gait problem (unchanged from baseline).  Skin: Positive for wound. Negative for color change and rash.  Neurological: Negative for numbness.  Hematological: Negative for adenopathy.       Objective:   Physical Exam  Constitutional: She is oriented to person, place, and time. She appears well-developed and well-nourished. No distress.  HENT:  Head: Normocephalic and atraumatic.  Right Ear: External ear normal.  Eyes: Conjunctivae are normal. No scleral icterus.  Pulmonary/Chest: Effort normal.  Neurological: She is alert and oriented to person, place, and time.  Skin: Skin is warm and dry. Lesion noted. She is not diaphoretic. No erythema.     In Right medial upper thigh, new groin, is very mildly erythematous indurated 1 cm area with center punctation of black and very hard - almost feels like a tiny needle or wire.  Attempted to remove with pick-ups under magnifying glass with only partial succss. Therefore, removed with punch biopsy.  Risks and benefits reviewed, and verbal informed consent obtained. Cleansed with alcohol 2 followed by Betadine 2. Anesthesia with subcutaneous administration of 1% lidocaine with epinephrine. 15m punch biopsy used to remove hard tiny black center punctuation to tick bite - presumably the remaining tick head. No EBL, wound was superficial so immediately hemostatic.  Biopsy site dressed with mupirocin in a pressure bandage. EBL< 1 mL. No complications. Patient tolerated procedure well. Wound care reviewed in detail and all questions answered.   Psychiatric: She has a normal mood and affect. Her behavior is normal.   Vitals:   08/13/17 1709  BP: 122/68  Pulse: 70  Resp: 18  Temp: 98.6 F (37 C)  TempSrc: Oral  SpO2: 95%  Weight: 156 lb  6.4 oz (70.9 kg)  Height: _0  (1.6 m)           Assessment & Plan:   1. Retained tick parts of thigh   removed retained mouth pieces with 241mpuch w/o comp - use below for wound care  Meds ordered this encounter  Medications  . mupirocin ointment (BACTROBAN) 2 %    Sig: Apply 1 application topically 4 (four) times daily.    Dispense:  30 g    Refill:  0    I personally performed the services described in this documentation, which was scribed in my presence. The recorded information has been reviewed and considered, and addended by me as needed.   EvDelman CheadleM.D.  [facility] [phone] ([phone]  08/29/17 8:45 PM

## 2017-08-23 ENCOUNTER — Other Ambulatory Visit: Payer: Self-pay

## 2017-08-23 MED ORDER — EMPAGLIFLOZIN 10 MG PO TABS: 10.0000 mg | ORAL_TABLET | Freq: Every day | ORAL | 4 refills | Status: DC

## 2017-08-29 ENCOUNTER — Encounter: Payer: Self-pay | Admitting: Family Medicine

## 2017-09-03 ENCOUNTER — Ambulatory Visit
Admission: RE | Admit: 2017-09-03 | Discharge: 2017-09-03 | Disposition: A | Discharge status: Home / Self Care | Payer: Medicare HMO | Source: Ambulatory Visit | Attending: Family Medicine | Admitting: Family Medicine

## 2017-09-03 DIAGNOSIS — Z1231 Encounter for screening mammogram for malignant neoplasm of breast: Secondary | ICD-10-CM

## 2017-09-07 DIAGNOSIS — M961 Postlaminectomy syndrome, not elsewhere classified: Secondary | ICD-10-CM | POA: Diagnosis not present

## 2017-09-07 DIAGNOSIS — G894 Chronic pain syndrome: Secondary | ICD-10-CM | POA: Diagnosis not present

## 2017-09-07 DIAGNOSIS — M5441 Lumbago with sciatica, right side: Secondary | ICD-10-CM | POA: Diagnosis not present

## 2017-09-07 DIAGNOSIS — M47816 Spondylosis without myelopathy or radiculopathy, lumbar region: Secondary | ICD-10-CM | POA: Diagnosis not present

## 2017-09-13 DIAGNOSIS — F319 Bipolar disorder, unspecified: Secondary | ICD-10-CM | POA: Diagnosis not present

## 2017-10-10 ENCOUNTER — Other Ambulatory Visit: Payer: Self-pay

## 2017-10-10 ENCOUNTER — Encounter: Payer: Self-pay | Admitting: Cardiovascular Disease

## 2017-10-10 ENCOUNTER — Encounter: Payer: Self-pay | Admitting: Endocrinology

## 2017-10-10 ENCOUNTER — Encounter: Payer: Self-pay | Admitting: Family Medicine

## 2017-10-10 MED ORDER — LEVOTHYROXINE SODIUM 100 MCG PO TABS: 100.0000 ug | ORAL_TABLET | Freq: Every day | ORAL | 11 refills | Status: DC

## 2017-10-10 NOTE — Telephone Encounter (Signed)
Called pt to inform her that she has refill left at her pharmacy. Pt stated that she has received the last 90 day supply. I explained to the pt that she has enough medication to last until she comes in for her yearly appt for August. Pt verbalized understanding.

## 2017-10-12 DIAGNOSIS — M533 Sacrococcygeal disorders, not elsewhere classified: Secondary | ICD-10-CM | POA: Diagnosis not present

## 2017-10-12 DIAGNOSIS — G8929 Other chronic pain: Secondary | ICD-10-CM | POA: Diagnosis not present

## 2017-10-12 MED ORDER — GABAPENTIN 800 MG PO TABS: 800.0000 mg | ORAL_TABLET | Freq: Four times a day (QID) | ORAL | 1 refills | Status: DC

## 2017-10-12 NOTE — Telephone Encounter (Signed)
Initially printed but not in office so I resent by e-rx.

## 2017-10-12 NOTE — Addendum Note (Signed)
Addended by: Shawnee Knapp on: 10/12/2017 05:21 PM   Modules accepted: Orders

## 2017-11-06 DIAGNOSIS — Z79899 Other long term (current) drug therapy: Secondary | ICD-10-CM | POA: Diagnosis not present

## 2017-11-06 DIAGNOSIS — H5203 Hypermetropia, bilateral: Secondary | ICD-10-CM | POA: Diagnosis not present

## 2017-11-06 DIAGNOSIS — M533 Sacrococcygeal disorders, not elsewhere classified: Secondary | ICD-10-CM | POA: Diagnosis not present

## 2017-11-06 DIAGNOSIS — M5441 Lumbago with sciatica, right side: Secondary | ICD-10-CM | POA: Diagnosis not present

## 2017-11-06 DIAGNOSIS — G894 Chronic pain syndrome: Secondary | ICD-10-CM | POA: Diagnosis not present

## 2017-11-06 DIAGNOSIS — Z5181 Encounter for therapeutic drug level monitoring: Secondary | ICD-10-CM | POA: Diagnosis not present

## 2017-11-06 DIAGNOSIS — G8929 Other chronic pain: Secondary | ICD-10-CM | POA: Diagnosis not present

## 2017-11-06 DIAGNOSIS — M961 Postlaminectomy syndrome, not elsewhere classified: Secondary | ICD-10-CM | POA: Diagnosis not present

## 2017-11-19 ENCOUNTER — Other Ambulatory Visit: Payer: Self-pay | Admitting: Family Medicine

## 2017-11-19 NOTE — Telephone Encounter (Signed)
Refill?  Pt was last seen 07/2017

## 2017-11-29 ENCOUNTER — Ambulatory Visit: Payer: Medicare HMO | Admitting: Endocrinology

## 2017-12-02 MED ORDER — DIAZEPAM 5 MG PO TABS: 5.0000 mg | ORAL_TABLET | Freq: Three times a day (TID) | ORAL | 0 refills | Status: DC | PRN

## 2017-12-03 DIAGNOSIS — M533 Sacrococcygeal disorders, not elsewhere classified: Secondary | ICD-10-CM | POA: Diagnosis not present

## 2017-12-06 ENCOUNTER — Encounter: Payer: Self-pay | Admitting: Family Medicine

## 2017-12-07 ENCOUNTER — Other Ambulatory Visit: Payer: Self-pay

## 2017-12-07 MED ORDER — ROSUVASTATIN CALCIUM 20 MG PO TABS: 20.0000 mg | ORAL_TABLET | Freq: Every day | ORAL | 1 refills | Status: DC

## 2017-12-11 ENCOUNTER — Encounter: Payer: Self-pay | Admitting: Family Medicine

## 2017-12-27 ENCOUNTER — Encounter: Payer: Self-pay | Admitting: Endocrinology

## 2017-12-27 ENCOUNTER — Ambulatory Visit (INDEPENDENT_AMBULATORY_CARE_PROVIDER_SITE_OTHER): Payer: Medicare HMO | Admitting: Endocrinology

## 2017-12-27 VITALS — BP 110/60 | HR 60 | Ht 63.0 in | Wt 151.8 lb

## 2017-12-27 DIAGNOSIS — E042 Nontoxic multinodular goiter: Secondary | ICD-10-CM

## 2017-12-27 DIAGNOSIS — E890 Postprocedural hypothyroidism: Secondary | ICD-10-CM

## 2017-12-27 DIAGNOSIS — E119 Type 2 diabetes mellitus without complications: Secondary | ICD-10-CM | POA: Diagnosis not present

## 2017-12-27 LAB — POCT GLYCOSYLATED HEMOGLOBIN (HGB A1C): Hemoglobin A1C: 6.3 % — AB (ref 4.0–5.6)

## 2017-12-27 LAB — TSH: TSH: 0.43 u[IU]/mL (ref 0.35–4.50)

## 2017-12-27 NOTE — Patient Instructions (Signed)
blood tests are requested for you today.  We'll let you know about the results. Let's recheck the ultrasound.  you will receive a phone call, about a day and time for an appointment. Please continue the same medications. check your blood sugar once a day.  vary the time of day when you check, between before the 3 meals, and at bedtime.  also check if you have symptoms of your blood sugar being too high or too low.  please keep a record of the readings and bring it to your next appointment here (or you can bring the meter itself).  You can write it on any piece of paper.  please call us sooner if your blood sugar goes below 70, or if you have a lot of readings over 200. Please come back for a follow-up appointment in 6 months.

## 2017-12-27 NOTE — Progress Notes (Signed)
Subjective:    Patient ID: [name], female    DOB: April 26, 1961, 57 y.o.   MRN: [id number]  HPI Pt has multinodular goiter (dx'ed 2014; it was found on [place], which had been done to investigate lymphadenopathy of the neck; the nodes appeared benign, but goiter was incidentally noted; bx was c/w benign follicular nodule; she was noted in early 2015 to have suppressed TSH, and she received RAI in April of 2015; in late 2015, she was noted to have elevated TSH, and was rx'ed synthroid; f/u US in 2016: nodules are smaller).   She does not notice any nodule in the neck.   Pt also returns for f/u of diabetes mellitus:  DM type: 2 Dx'ed: 4650 Complications: polyneuropathy, PAD, and CAD.   Therapy: 4 oral meds GDM: never DKA: never Severe hypoglycemia: never Pancreatitis: never Other: She has never been on insulin; she did not tolerate invokana (nausea).   Interval history: Pt states cbg's are well-controlled.  pt states she feels well in general. Past Medical History:  Diagnosis Date  . Allergy   . Anxiety   . Arthritis   . Back pain, chronic   . Bipolar disorder ([place])   . Bronchitis    hx-no inhalers at home  . Carotid bruit   . Carpal tunnel syndrome on both sides   . Coronary artery disease   . DDD (degenerative disc disease), lumbar   . Depression   . Diabetes mellitus    diet controlled  . Ejection fraction    .  Marland Kitchen GERD (gastroesophageal reflux disease)   . Hx of CABG   . Hypercholesteremia   . Hypertension   . Hypothyroidism   . Multiple thyroid nodules   . Myocardial infarction ([place]) 11/20/06  . Neuromuscular disorder ([place])   . Nodule of neck     Past Surgical History:  Procedure Laterality Date  . ABDOMINAL HYSTERECTOMY  1984  . BREAST EXCISIONAL BIOPSY Right    benign  . BREAST EXCISIONAL BIOPSY Left    benign  . BREAST SURGERY     rt br cystx2  . CORONARY ARTERY BYPASS GRAFT  2008  . KNEE SURGERY  2009   lt   . ORIF ANKLE FRACTURE Right  07/12/2012   Procedure: OPEN REDUCTION INTERNAL FIXATION (ORIF) RIGHT ANKLE FRACTURE;  Surgeon: Alta Corning, MD;  Location: [facility];  Service: Orthopedics;  Laterality: Right;  . SHOULDER SURGERY  11/08/2016  . TUBAL LIGATION    . WRIST SURGERY Left     Social History   Socioeconomic History  . Marital status: Married    Spouse name: Not on file  . Number of children: 2  . Years of education: Not on file  . Highest education level: 11th grade  Occupational History  . Not on file  Social Needs  . Financial resource strain: Not hard at all  . Food insecurity:    Worry: Never true    Inability: Never true  . Transportation needs:    Medical: No    Non-medical: No  Tobacco Use  . Smoking status: Current Every Day Smoker    Packs/day: 1.00    Years: 33.00    Pack years: 33.00    Types: Cigarettes  . Smokeless tobacco: Never Used  Substance and Sexual Activity  . Alcohol use: No  . Drug use: No  . Sexual activity: Not on file  Lifestyle  . Physical activity:    Days per week: 0  days    Minutes per session: 0 min  . Stress: To some extent  Relationships  . Social connections:    Talks on phone: Once a week    Gets together: Once a week    Attends religious service: Never    Active member of club or organization: No    Attends meetings of clubs or organizations: Never    Relationship status: Married  . Intimate partner violence:    Fear of current or ex partner: No    Emotionally abused: No    Physically abused: No    Forced sexual activity: No  Other Topics Concern  . Not on file  Social History Narrative  . Not on file    Current Outpatient Medications on File Prior to Visit  Medication Sig Dispense Refill  . acarbose (PRECOSE) 25 MG tablet Take 1 tablet (25 mg total) by mouth 3 (three) times daily with meals. 270 tablet 3  . ACCU-CHEK FASTCLIX LANCETS MISC Use to check blood sugar 3 times daily. 306 each 1  . aspirin EC 81 MG tablet Take  81 mg by mouth 2 (two) times daily.    . Bisacodyl (DULCOLAX PO) Take by mouth.    . Bromocriptine Mesylate 0.8 MG TABS Take 1 tablet (0.8 mg total) by mouth daily. 30 tablet 11  . carvedilol (COREG) 3.125 MG tablet TAKE 1 TABLET (3.125 MG TOTAL) BY MOUTH 2 (TWO) TIMES DAILY. 180 tablet 1  . diazepam (VALIUM) 5 MG tablet Take 1 tablet (5 mg total) by mouth every 8 (eight) hours as needed for anxiety or muscle spasms. Separate by >4 hrs from any sedative/pain 60 tablet 0  . DULoxetine (CYMBALTA) 60 MG capsule Take 60 mg by mouth 2 (two) times daily.    . empagliflozin (JARDIANCE) 10 MG TABS tablet Take 10 mg by mouth daily. 90 tablet 4  . fenofibrate (TRICOR) 145 MG tablet Take 1 tablet (145 mg total) by mouth daily. 90 tablet 3  . fluticasone (FLONASE) 50 MCG/ACT nasal spray Place 2 sprays into both nostrils at bedtime. 16 g 2  . gabapentin (NEURONTIN) 800 MG tablet Take 1 tablet (800 mg total) by mouth 4 (four) times daily. 360 tablet 1  . glucose blood (ACCU-CHEK GUIDE) test strip Use to check blood sugar 3 times daily. 300 each 1  . Guaifenesin (MUCINEX MAXIMUM STRENGTH) 1200 MG TB12 Take 1 tablet (1,200 mg total) by mouth every 12 (twelve) hours as needed. 14 tablet 1  . Ipratropium-Albuterol (COMBIVENT RESPIMAT) 20-100 MCG/ACT AERS respimat Inhale 1 puff into the lungs every 6 (six) hours. 4 g 11  . levothyroxine (SYNTHROID, LEVOTHROID) 100 MCG tablet Take 1 tablet (100 mcg total) by mouth daily before breakfast. 30 tablet 11  . metFORMIN (GLUCOPHAGE) 1000 MG tablet TAKE 1 TABLET BY MOUTH DAILY WITH BREAKFAST. 90 tablet 3  . Methylnaltrexone Bromide (RELISTOR) 150 MG TABS Take 3 tablets by mouth daily. With water >30 min before first mea 270 tablet 3  . mupirocin ointment (BACTROBAN) 2 % Apply 1 application topically 4 (four) times daily. 30 g 0  . Omega-3 Fatty Acids (FISH OIL) 1000 MG CAPS Take by mouth.    Marland Kitchen omeprazole (PRILOSEC) 40 MG capsule TAKE 1 CAPSULE DAILY 30 MINUTES BEFORE A MEAL  90 capsule 3  . oxyCODONE-acetaminophen (PERCOCET) 10-325 MG tablet Take 1 tablet by mouth every 4 (four) hours as needed for pain.    Marland Kitchen QUEtiapine (SEROQUEL) 200 MG tablet Take 200 mg by mouth 3 (  three) times daily.    . ranitidine (ZANTAC) 150 MG tablet Take 1 tablet (150 mg total) by mouth 2 (two) times daily. 180 tablet 3  . rosuvastatin (CRESTOR) 20 MG tablet Take 1 tablet (20 mg total) by mouth daily. 90 tablet 1  . tiZANidine (ZANAFLEX) 4 MG tablet Take 4 mg by mouth every 6 (six) hours as needed for muscle spasms.    . traZODone (DESYREL) 100 MG tablet Take 1 tablet (100 mg total) by mouth at bedtime as needed for sleep. 30 tablet 2   No current facility-administered medications on file prior to visit.     Allergies  Allergen Reactions  . Coconut Flavor Swelling and Rash  . Ibuprofen Swelling and Rash  . Prednisone Swelling and Rash  . Cyclobenzaprine   . Morphine Nausea And Vomiting    Family History  Problem Relation Age of Onset  . Heart disease Mother   . Hyperlipidemia Mother   . Hypertension Mother   . Heart disease Father   . Stroke Father   . Hypertension Father   . Hyperlipidemia Father   . Hypertension Sister   . Hypertension Brother   . Cancer Daughter        unknown  . Hypertension Maternal Grandmother   . Hypertension Maternal Grandfather   . Cancer Maternal Grandfather   . Hypertension Paternal Grandmother   . Hypertension Paternal Grandfather   . Cancer Maternal Aunt        breast and ovarian  . Cancer Paternal Aunt        breast  . Colon cancer Neg Hx     BP 110/60 (BP Location: Left Arm, Patient Position: Sitting, Cuff Size: Normal)   Pulse 60   Ht 5\' 3"  (1.6 m)   Wt 151 lb 12.8 oz (68.9 kg)   SpO2 96%   BMI 26.89 kg/m    Review of Systems No weight change    Objective:   Physical Exam VITAL SIGNS:  See vs page GENERAL: no distress NECK: thyroid is slightly enlarged, with multinodular surface.  Pulses: dorsalis pedis intact bilat.    MSK: no deformity of the feet CV: no leg edema.  Skin:  no ulcer on the feet.  normal temp on the feet.  Large port-wine area on the right leg and foot. Neuro: sensation is intact to touch on the feet, but decreased from normal.  Lab Results  Component Value Date   HGBA1C 6.3 (A) 12/27/2017   Lab Results  Component Value Date   CREATININE 0.75 05/10/2017   BUN 19 05/10/2017   NA 142 05/10/2017   K 4.2 05/10/2017   CL 106 05/10/2017   CO2 20 05/10/2017    Lab Results  Component Value Date   TSH 0.43 12/27/2017      Assessment & Plan:  Type 2 DM: well-controlled Multinodular goiter: due fpr recheck Hypothyroidism: well-replaced.  Please continue the same medication  Patient Instructions  blood tests are requested for you today.  We'll let you know about the results. Let's recheck the ultrasound.  you will receive a phone call, about a day and time for an appointment. Please continue the same medications. check your blood sugar once a day.  vary the time of day when you check, between before the 3 meals, and at bedtime.  also check if you have symptoms of your blood sugar being too high or too low.  please keep a record of the readings and bring it to your next appointment  here (or you can bring the meter itself).  You can write it on any piece of paper.  please call us sooner if your blood sugar goes below 70, or if you have a lot of readings over 200. Please come back for a follow-up appointment in 6 months.

## 2018-01-01 ENCOUNTER — Encounter: Payer: Self-pay | Admitting: Family Medicine

## 2018-01-01 ENCOUNTER — Encounter: Payer: Self-pay | Admitting: Cardiology

## 2018-01-01 ENCOUNTER — Other Ambulatory Visit: Payer: Self-pay | Admitting: Thoracic Surgery (Cardiothoracic Vascular Surgery)

## 2018-01-01 ENCOUNTER — Ambulatory Visit: Payer: Medicare HMO | Admitting: Cardiology

## 2018-01-01 ENCOUNTER — Encounter: Payer: Self-pay | Admitting: Endocrinology

## 2018-01-01 VITALS — BP 106/64 | HR 67 | Ht 61.5 in | Wt 154.4 lb

## 2018-01-01 DIAGNOSIS — E119 Type 2 diabetes mellitus without complications: Secondary | ICD-10-CM

## 2018-01-01 DIAGNOSIS — E7800 Pure hypercholesterolemia, unspecified: Secondary | ICD-10-CM

## 2018-01-01 DIAGNOSIS — I2510 Atherosclerotic heart disease of native coronary artery without angina pectoris: Secondary | ICD-10-CM

## 2018-01-01 DIAGNOSIS — I10 Essential (primary) hypertension: Secondary | ICD-10-CM

## 2018-01-01 NOTE — Patient Instructions (Signed)
Medication Instructions: Your physician recommends that you continue on your current medications as directed. Please refer to the Current Medication list given to you today.   Labwork: None Ordered  Procedures/Testing: None Ordered  Follow-Up: Your physician wants you to follow-up in: 6 months with Dr.Nahser You will receive a reminder letter in the mail two months in advance. If you don't receive a letter, please call our office to schedule the follow-up appointment.   Any Additional Special Instructions Will Be Listed Below (If Applicable).   DASH Eating Plan DASH stands for "Dietary Approaches to Stop Hypertension." The DASH eating plan is a healthy eating plan that has been shown to reduce high blood pressure (hypertension). It may also reduce your risk for type 2 diabetes, heart disease, and stroke. The DASH eating plan may also help with weight loss. What are tips for following this plan? General guidelines  Avoid eating more than 2,300 mg (milligrams) of salt (sodium) a day. If you have hypertension, you may need to reduce your sodium intake to 1,500 mg a day.  Limit alcohol intake to no more than 1 drink a day for nonpregnant women and 2 drinks a day for men. One drink equals 12 oz of beer, 5 oz of wine, or 1 oz of hard liquor.  Work with your health care provider to maintain a healthy body weight or to lose weight. Ask what an ideal weight is for you.  Get at least 30 minutes of exercise that causes your heart to beat faster (aerobic exercise) most days of the week. Activities may include walking, swimming, or biking.  Work with your health care provider or diet and nutrition specialist (dietitian) to adjust your eating plan to your individual calorie needs. Reading food labels  Check food labels for the amount of sodium per serving. Choose foods with less than 5 percent of the Daily Value of sodium. Generally, foods with less than 300 mg of sodium per serving fit into this  eating plan.  To find whole grains, look for the word "whole" as the first word in the ingredient list. Shopping  Buy products labeled as "low-sodium" or "no salt added."  Buy fresh foods. Avoid canned foods and premade or frozen meals. Cooking  Avoid adding salt when cooking. Use salt-free seasonings or herbs instead of table salt or sea salt. Check with your health care provider or pharmacist before using salt substitutes.  Do not fry foods. Cook foods using healthy methods such as baking, boiling, grilling, and broiling instead.  Cook with heart-healthy oils, such as olive, canola, soybean, or sunflower oil. Meal planning   Eat a balanced diet that includes: ? 5 or more servings of fruits and vegetables each day. At each meal, try to fill half of your plate with fruits and vegetables. ? Up to 6-8 servings of whole grains each day. ? Less than 6 oz of lean meat, poultry, or fish each day. A 3-oz serving of meat is about the same size as a deck of cards. One egg equals 1 oz. ? 2 servings of low-fat dairy each day. ? A serving of nuts, seeds, or beans 5 times each week. ? Heart-healthy fats. Healthy fats called Omega-3 fatty acids are found in foods such as flaxseeds and coldwater fish, like sardines, salmon, and mackerel.  Limit how much you eat of the following: ? Canned or prepackaged foods. ? Food that is high in trans fat, such as fried foods. ? Food that is high in saturated  fat, such as fatty meat. ? Sweets, desserts, sugary drinks, and other foods with added sugar. ? Full-fat dairy products.  Do not salt foods before eating.  Try to eat at least 2 vegetarian meals each week.  Eat more home-cooked food and less restaurant, buffet, and fast food.  When eating at a restaurant, ask that your food be prepared with less salt or no salt, if possible. What foods are recommended? The items listed may not be a complete list. Talk with your dietitian about what dietary choices  are best for you. Grains Whole-grain or whole-wheat bread. Whole-grain or whole-wheat pasta. Brown rice. [place]. Bulgur. Whole-grain and low-sodium cereals. Pita bread. Low-fat, low-sodium crackers. Whole-wheat flour tortillas. Vegetables Fresh or frozen vegetables (raw, steamed, roasted, or grilled). Low-sodium or reduced-sodium tomato and vegetable juice. Low-sodium or reduced-sodium tomato sauce and tomato paste. Low-sodium or reduced-sodium canned vegetables. Fruits All fresh, dried, or frozen fruit. Canned fruit in natural juice (without added sugar). Meat and other protein foods Skinless chicken or [place]. Ground chicken or [place]. Pork with fat trimmed off. Fish and seafood. Egg whites. Dried beans, peas, or lentils. Unsalted nuts, nut butters, and seeds. Unsalted canned beans. Lean cuts of beef with fat trimmed off. Low-sodium, lean deli meat. Dairy Low-fat (1%) or fat-free (skim) milk. Fat-free, low-fat, or reduced-fat cheeses. Nonfat, low-sodium ricotta or cottage cheese. Low-fat or nonfat yogurt. Low-fat, low-sodium cheese. Fats and oils Soft margarine without trans fats. Vegetable oil. Low-fat, reduced-fat, or light mayonnaise and salad dressings (reduced-sodium). Canola, safflower, olive, soybean, and sunflower oils. Avocado. Seasoning and other foods Herbs. Spices. Seasoning mixes without salt. Unsalted popcorn and pretzels. Fat-free sweets. What foods are not recommended? The items listed may not be a complete list. Talk with your dietitian about what dietary choices are best for you. Grains Baked goods made with fat, such as croissants, muffins, or some breads. Dry pasta or rice meal packs. Vegetables Creamed or fried vegetables. Vegetables in a cheese sauce. Regular canned vegetables (not low-sodium or reduced-sodium). Regular canned tomato sauce and paste (not low-sodium or reduced-sodium). Regular tomato and vegetable juice (not low-sodium or reduced-sodium). Angie Fava.  Olives. Fruits Canned fruit in a light or heavy syrup. Fried fruit. Fruit in cream or butter sauce. Meat and other protein foods Fatty cuts of meat. Ribs. Fried meat. Berniece Salines. Sausage. Bologna and other processed lunch meats. Salami. Fatback. Hotdogs. Bratwurst. Salted nuts and seeds. Canned beans with added salt. Canned or smoked fish. Whole eggs or egg yolks. Chicken or [place] with skin. Dairy Whole or 2% milk, cream, and half-and-half. Whole or full-fat cream cheese. Whole-fat or sweetened yogurt. Full-fat cheese. Nondairy creamers. Whipped toppings. Processed cheese and cheese spreads. Fats and oils Butter. Stick margarine. Lard. Shortening. Ghee. Bacon fat. Tropical oils, such as coconut, palm kernel, or palm oil. Seasoning and other foods Salted popcorn and pretzels. Onion salt, garlic salt, seasoned salt, table salt, and sea salt. Worcestershire sauce. Tartar sauce. Barbecue sauce. Teriyaki sauce. Soy sauce, including reduced-sodium. Steak sauce. Canned and packaged gravies. Fish sauce. Oyster sauce. Cocktail sauce. Horseradish that you find on the shelf. Ketchup. Mustard. Meat flavorings and tenderizers. Bouillon cubes. Hot sauce and Tabasco sauce. Premade or packaged marinades. Premade or packaged taco seasonings. Relishes. Regular salad dressings. Where to find more information:  National Heart, Lung, and [place]: https://wilson-eaton.com/  American Heart Association: www.heart.org Summary  The DASH eating plan is a healthy eating plan that has been shown to reduce high blood pressure (hypertension). It may also reduce  your risk for type 2 diabetes, heart disease, and stroke.  With the DASH eating plan, you should limit salt (sodium) intake to 2,300 mg a day. If you have hypertension, you may need to reduce your sodium intake to 1,500 mg a day.  When on the DASH eating plan, aim to eat more fresh fruits and vegetables, whole grains, lean proteins, low-fat dairy, and heart-healthy  fats.  Work with your health care provider or diet and nutrition specialist (dietitian) to adjust your eating plan to your individual calorie needs. This information is not intended to replace advice given to you by your health care provider. Make sure you discuss any questions you have with your health care provider. Document Released: 04/06/2011 Document Revised: 04/10/2016 Document Reviewed: 04/10/2016 Elsevier Interactive Patient Education  [employer].    If you need a refill on your cardiac medications before your next appointment, please call your pharmacy.

## 2018-01-01 NOTE — Progress Notes (Signed)
Cardiology Office Note:    Date:  01/01/2018   ID:  [name], DOB 1960-12-06, MRN [id number]  PCP:  Shawnee Knapp, MD  Cardiologist:  Mertie Moores, MD  Referring MD: Shawnee Knapp, MD   Chief Complaint  Patient presents with  . Follow-up    CAD    History of Present Illness:    [name] is a 57 y.o. female with a past medical history significant for CAD s/p MI & CABG 2008, hypertension, hyperlipidemia, hypothyroidism, GERD, diabetes mellitus type 2, bipolar disorder.   [name] is here today for her annual follow up. She is on disability for her back. She had back surgery about 2 years ago, but her back pain was actually worse after the surgery. She is unable to do any exercise or walking. Walking causes right leg pain. She swims in her pool almost every day for up to 2 hours. With what she is able to do she has no exertional chest discomfort or shortness of breath. She denies orthopnea, PND, edema, syncope. She occ gets lightheaded when her blood sugar is low.   She takes aspirin 81 mg BID to help reduce superficial blood clots in her birthmark on her leg.   Past Medical History:  Diagnosis Date  . Allergy   . Anxiety   . Arthritis   . Back pain, chronic   . Bipolar disorder ([place])   . Bronchitis    hx-no inhalers at home  . Carotid bruit   . Carpal tunnel syndrome on both sides   . Coronary artery disease   . DDD (degenerative disc disease), lumbar   . Depression   . Diabetes mellitus    diet controlled  . Ejection fraction    .  Marland Kitchen GERD (gastroesophageal reflux disease)   . Hx of CABG   . Hypercholesteremia   . Hypertension   . Hypothyroidism   . Multiple thyroid nodules   . Myocardial infarction ([place]) 11/20/06  . Neuromuscular disorder ([place])   . Nodule of neck     Past Surgical History:  Procedure Laterality Date  . ABDOMINAL HYSTERECTOMY  1984  . BREAST EXCISIONAL BIOPSY Right    benign  . BREAST EXCISIONAL BIOPSY Left    benign  .  BREAST SURGERY     rt br cystx2  . CORONARY ARTERY BYPASS GRAFT  2008  . KNEE SURGERY  2009   lt   . ORIF ANKLE FRACTURE Right 07/12/2012   Procedure: OPEN REDUCTION INTERNAL FIXATION (ORIF) RIGHT ANKLE FRACTURE;  Surgeon: Alta Corning, MD;  Location: [facility];  Service: Orthopedics;  Laterality: Right;  . SHOULDER SURGERY  11/08/2016  . TUBAL LIGATION    . WRIST SURGERY Left     Current Medications: Current Meds  Medication Sig  . acarbose (PRECOSE) 25 MG tablet Take 1 tablet (25 mg total) by mouth 3 (three) times daily with meals.  Marland Kitchen ACCU-CHEK FASTCLIX LANCETS MISC Use to check blood sugar 3 times daily.  Marland Kitchen aspirin EC 81 MG tablet Take 81 mg by mouth 2 (two) times daily.  . Bisacodyl (DULCOLAX PO) Take 1 tablet by mouth 2 (two) times daily.   . Bromocriptine Mesylate 0.8 MG TABS Take 1 tablet (0.8 mg total) by mouth daily.  . carvedilol (COREG) 3.125 MG tablet TAKE 1 TABLET (3.125 MG TOTAL) BY MOUTH 2 (TWO) TIMES DAILY.  . diazepam (VALIUM) 5 MG tablet Take 1 tablet (5 mg total) by mouth every 8 (  eight) hours as needed for anxiety or muscle spasms. Separate by >4 hrs from any sedative/pain  . DULoxetine (CYMBALTA) 60 MG capsule Take 60 mg by mouth 2 (two) times daily.  . empagliflozin (JARDIANCE) 10 MG TABS tablet Take 10 mg by mouth daily.  . fenofibrate (TRICOR) 145 MG tablet Take 1 tablet (145 mg total) by mouth daily.  . fluticasone (FLONASE) 50 MCG/ACT nasal spray Place 2 sprays into both nostrils at bedtime.  . gabapentin (NEURONTIN) 800 MG tablet Take 1 tablet (800 mg total) by mouth 4 (four) times daily.  Marland Kitchen glucose blood (ACCU-CHEK GUIDE) test strip Use to check blood sugar 3 times daily.  . Guaifenesin (MUCINEX MAXIMUM STRENGTH) 1200 MG TB12 Take 1 tablet (1,200 mg total) by mouth every 12 (twelve) hours as needed.  . Ipratropium-Albuterol (COMBIVENT RESPIMAT) 20-100 MCG/ACT AERS respimat Inhale 1 puff into the lungs every 6 (six) hours.  Marland Kitchen levothyroxine  (SYNTHROID, LEVOTHROID) 100 MCG tablet Take 1 tablet (100 mcg total) by mouth daily before breakfast.  . metFORMIN (GLUCOPHAGE) 1000 MG tablet TAKE 1 TABLET BY MOUTH DAILY WITH BREAKFAST.  Marland Kitchen Methylnaltrexone Bromide (RELISTOR) 150 MG TABS Take 3 tablets by mouth daily. With water >30 min before first mea  . mupirocin ointment (BACTROBAN) 2 % Apply 1 application topically 4 (four) times daily.  . Omega-3 Fatty Acids (FISH OIL) 1000 MG CAPS Take by mouth.  Marland Kitchen omeprazole (PRILOSEC) 40 MG capsule TAKE 1 CAPSULE DAILY 30 MINUTES BEFORE A MEAL  . oxyCODONE-acetaminophen (PERCOCET) 10-325 MG tablet Take 1 tablet by mouth every 4 (four) hours as needed for pain.  Marland Kitchen QUEtiapine (SEROQUEL) 200 MG tablet Take 200 mg by mouth 3 (three) times daily.  . ranitidine (ZANTAC) 150 MG tablet Take 1 tablet (150 mg total) by mouth 2 (two) times daily.  . rosuvastatin (CRESTOR) 20 MG tablet Take 1 tablet (20 mg total) by mouth daily.  Marland Kitchen tiZANidine (ZANAFLEX) 4 MG tablet Take 4 mg by mouth every 6 (six) hours as needed for muscle spasms.  . traZODone (DESYREL) 100 MG tablet Take 1 tablet (100 mg total) by mouth at bedtime as needed for sleep.     Allergies:   Coconut flavor; Ibuprofen; Prednisone; Cyclobenzaprine; and Morphine   Social History   Socioeconomic History  . Marital status: Married    Spouse name: Not on file  . Number of children: 2  . Years of education: Not on file  . Highest education level: 11th grade  Occupational History  . Not on file  Social Needs  . Financial resource strain: Not hard at all  . Food insecurity:    Worry: Never true    Inability: Never true  . Transportation needs:    Medical: No    Non-medical: No  Tobacco Use  . Smoking status: Current Every Day Smoker    Packs/day: 1.00    Years: 33.00    Pack years: 33.00    Types: Cigarettes  . Smokeless tobacco: Never Used  Substance and Sexual Activity  . Alcohol use: No  . Drug use: No  . Sexual activity: Not on file    Lifestyle  . Physical activity:    Days per week: 0 days    Minutes per session: 0 min  . Stress: To some extent  Relationships  . Social connections:    Talks on phone: Once a week    Gets together: Once a week    Attends religious service: Never    Active member of  club or organization: No    Attends meetings of clubs or organizations: Never    Relationship status: Married  Other Topics Concern  . Not on file  Social History Narrative  . Not on file     Family History: The patient's family history includes Cancer in her daughter, maternal aunt, maternal grandfather, and paternal aunt; Heart disease in her father and mother; Hyperlipidemia in her father and mother; Hypertension in her brother, father, maternal grandfather, maternal grandmother, mother, paternal grandfather, paternal grandmother, and sister; Stroke in her father. There is no history of Colon cancer. ROS:   Please see the history of present illness.     All other systems reviewed and are negative.  EKGs/Labs/Other Studies Reviewed:    The following studies were reviewed today:  Normal nuclear stress test 01/03/13  EKG:  EKG is ordered today.  The ekg ordered today demonstrates NSR, 67 bpm, with non-specific T changes, similar to previous.   Recent Labs: 05/10/2017: ALT 10; BUN 19; Creatinine, Ser 0.75; Hemoglobin 14.5; Platelets 359; Potassium 4.2; Sodium 142 12/27/2017: TSH 0.43   Recent Lipid Panel    Component Value Date/Time   CHOL 153 04/04/2017 0734   TRIG 150 (H) 04/04/2017 0734   HDL 37 (L) 04/04/2017 0734   CHOLHDL 4.1 04/04/2017 0734   CHOLHDL 3.8 03/13/2014 1526   VLDL 34 03/13/2014 1526   LDLCALC 86 04/04/2017 0734    Physical Exam:    VS:  BP 106/64   Pulse 67   Ht 5' 1.5" (1.562 m)   Wt 154 lb 6.4 oz (70 kg)   BMI 28.70 kg/m     Wt Readings from Last 3 Encounters:  01/01/18 154 lb 6.4 oz (70 kg)  12/27/17 151 lb 12.8 oz (68.9 kg)  08/13/17 156 lb 6.4 oz (70.9 kg)     Physical  Exam  Constitutional: She is oriented to person, place, and time. She appears well-developed and well-nourished.  HENT:  Head: Normocephalic and atraumatic.  Neck: Normal range of motion. Neck supple. No JVD present.  Cardiovascular: Normal rate, regular rhythm, normal heart sounds and intact distal pulses. Exam reveals no gallop and no friction rub.  No murmur heard. Pulmonary/Chest: Effort normal and breath sounds normal. No respiratory distress. She has no wheezes. She has no rales.  Abdominal: Soft. Bowel sounds are normal. She exhibits no distension.  Musculoskeletal: Normal range of motion. She exhibits no edema.  Neurological: She is alert and oriented to person, place, and time.  Skin: Skin is warm and dry.  Psychiatric: She has a normal mood and affect. Her behavior is normal. Judgment and thought content normal.  Vitals reviewed.    ASSESSMENT:    1. Coronary artery disease involving native coronary artery of native heart without angina pectoris   2. Essential hypertension   3. Pure hypercholesterolemia   4. Type 2 diabetes mellitus without complication, without long-term current use of insulin (HCC)    PLAN:    In order of problems listed above:  CAD: No anginal symptoms. Is not able to exercise or be very active due to back pain. She does swim on most days. Encouraged to continue this. Continue Aspirin 81 mg twice daily, carvedilol, statin.  Hypertension: BP well controlled. Tolerating meds well. DASH diet.   Hyperlipidemia: LDL was 46 last August 2018, up to 26 in December 2018.  On Crestor 20 mg daily, fenofibrate 145 mg daily and fish oil. Triglycerides were 265, came down to 150 with addition of  fenofibrate. Plan to check lipids at next appt.   DM type II: A1c 6.3 on 12/27/2017, well-controlled <7.  She is on Empagliflozin.    Medication Adjustments/Labs and Tests Ordered: Current medicines are reviewed at length with the patient today.  Concerns regarding  medicines are outlined above. Labs and tests ordered and medication changes are outlined in the patient instructions below:  Patient Instructions  Medication Instructions: Your physician recommends that you continue on your current medications as directed. Please refer to the Current Medication list given to you today.   Labwork: None Ordered  Procedures/Testing: None Ordered  Follow-Up: Your physician wants you to follow-up in: 6 months with Dr.Nahser You will receive a reminder letter in the mail two months in advance. If you don't receive a letter, please call our office to schedule the follow-up appointment.   Any Additional Special Instructions Will Be Listed Below (If Applicable).   DASH Eating Plan DASH stands for "Dietary Approaches to Stop Hypertension." The DASH eating plan is a healthy eating plan that has been shown to reduce high blood pressure (hypertension). It may also reduce your risk for type 2 diabetes, heart disease, and stroke. The DASH eating plan may also help with weight loss. What are tips for following this plan? General guidelines  Avoid eating more than 2,300 mg (milligrams) of salt (sodium) a day. If you have hypertension, you may need to reduce your sodium intake to 1,500 mg a day.  Limit alcohol intake to no more than 1 drink a day for nonpregnant women and 2 drinks a day for men. One drink equals 12 oz of beer, 5 oz of wine, or 1 oz of hard liquor.  Work with your health care provider to maintain a healthy body weight or to lose weight. Ask what an ideal weight is for you.  Get at least 30 minutes of exercise that causes your heart to beat faster (aerobic exercise) most days of the week. Activities may include walking, swimming, or biking.  Work with your health care provider or diet and nutrition specialist (dietitian) to adjust your eating plan to your individual calorie needs. Reading food labels  Check food labels for the amount of sodium per  serving. Choose foods with less than 5 percent of the Daily Value of sodium. Generally, foods with less than 300 mg of sodium per serving fit into this eating plan.  To find whole grains, look for the word "whole" as the first word in the ingredient list. Shopping  Buy products labeled as "low-sodium" or "no salt added."  Buy fresh foods. Avoid canned foods and premade or frozen meals. Cooking  Avoid adding salt when cooking. Use salt-free seasonings or herbs instead of table salt or sea salt. Check with your health care provider or pharmacist before using salt substitutes.  Do not fry foods. Cook foods using healthy methods such as baking, boiling, grilling, and broiling instead.  Cook with heart-healthy oils, such as olive, canola, soybean, or sunflower oil. Meal planning   Eat a balanced diet that includes: ? 5 or more servings of fruits and vegetables each day. At each meal, try to fill half of your plate with fruits and vegetables. ? Up to 6-8 servings of whole grains each day. ? Less than 6 oz of lean meat, poultry, or fish each day. A 3-oz serving of meat is about the same size as a deck of cards. One egg equals 1 oz. ? 2 servings of low-fat dairy each day. ?  A serving of nuts, seeds, or beans 5 times each week. ? Heart-healthy fats. Healthy fats called Omega-3 fatty acids are found in foods such as flaxseeds and coldwater fish, like sardines, salmon, and mackerel.  Limit how much you eat of the following: ? Canned or prepackaged foods. ? Food that is high in trans fat, such as fried foods. ? Food that is high in saturated fat, such as fatty meat. ? Sweets, desserts, sugary drinks, and other foods with added sugar. ? Full-fat dairy products.  Do not salt foods before eating.  Try to eat at least 2 vegetarian meals each week.  Eat more home-cooked food and less restaurant, buffet, and fast food.  When eating at a restaurant, ask that your food be prepared with less salt  or no salt, if possible. What foods are recommended? The items listed may not be a complete list. Talk with your dietitian about what dietary choices are best for you. Grains Whole-grain or whole-wheat bread. Whole-grain or whole-wheat pasta. Brown rice. [place]. Bulgur. Whole-grain and low-sodium cereals. Pita bread. Low-fat, low-sodium crackers. Whole-wheat flour tortillas. Vegetables Fresh or frozen vegetables (raw, steamed, roasted, or grilled). Low-sodium or reduced-sodium tomato and vegetable juice. Low-sodium or reduced-sodium tomato sauce and tomato paste. Low-sodium or reduced-sodium canned vegetables. Fruits All fresh, dried, or frozen fruit. Canned fruit in natural juice (without added sugar). Meat and other protein foods Skinless chicken or [place]. Ground chicken or [place]. Pork with fat trimmed off. Fish and seafood. Egg whites. Dried beans, peas, or lentils. Unsalted nuts, nut butters, and seeds. Unsalted canned beans. Lean cuts of beef with fat trimmed off. Low-sodium, lean deli meat. Dairy Low-fat (1%) or fat-free (skim) milk. Fat-free, low-fat, or reduced-fat cheeses. Nonfat, low-sodium ricotta or cottage cheese. Low-fat or nonfat yogurt. Low-fat, low-sodium cheese. Fats and oils Soft margarine without trans fats. Vegetable oil. Low-fat, reduced-fat, or light mayonnaise and salad dressings (reduced-sodium). Canola, safflower, olive, soybean, and sunflower oils. Avocado. Seasoning and other foods Herbs. Spices. Seasoning mixes without salt. Unsalted popcorn and pretzels. Fat-free sweets. What foods are not recommended? The items listed may not be a complete list. Talk with your dietitian about what dietary choices are best for you. Grains Baked goods made with fat, such as croissants, muffins, or some breads. Dry pasta or rice meal packs. Vegetables Creamed or fried vegetables. Vegetables in a cheese sauce. Regular canned vegetables (not low-sodium or reduced-sodium).  Regular canned tomato sauce and paste (not low-sodium or reduced-sodium). Regular tomato and vegetable juice (not low-sodium or reduced-sodium). Angie Fava. Olives. Fruits Canned fruit in a light or heavy syrup. Fried fruit. Fruit in cream or butter sauce. Meat and other protein foods Fatty cuts of meat. Ribs. Fried meat. Berniece Salines. Sausage. Bologna and other processed lunch meats. Salami. Fatback. Hotdogs. Bratwurst. Salted nuts and seeds. Canned beans with added salt. Canned or smoked fish. Whole eggs or egg yolks. Chicken or [place] with skin. Dairy Whole or 2% milk, cream, and half-and-half. Whole or full-fat cream cheese. Whole-fat or sweetened yogurt. Full-fat cheese. Nondairy creamers. Whipped toppings. Processed cheese and cheese spreads. Fats and oils Butter. Stick margarine. Lard. Shortening. Ghee. Bacon fat. Tropical oils, such as coconut, palm kernel, or palm oil. Seasoning and other foods Salted popcorn and pretzels. Onion salt, garlic salt, seasoned salt, table salt, and sea salt. Worcestershire sauce. Tartar sauce. Barbecue sauce. Teriyaki sauce. Soy sauce, including reduced-sodium. Steak sauce. Canned and packaged gravies. Fish sauce. Oyster sauce. Cocktail sauce. Horseradish that you find on the shelf. Ketchup.  Mustard. Meat flavorings and tenderizers. Bouillon cubes. Hot sauce and Tabasco sauce. Premade or packaged marinades. Premade or packaged taco seasonings. Relishes. Regular salad dressings. Where to find more information:  National Heart, Lung, and [place]: https://wilson-eaton.com/  American Heart Association: www.heart.org Summary  The DASH eating plan is a healthy eating plan that has been shown to reduce high blood pressure (hypertension). It may also reduce your risk for type 2 diabetes, heart disease, and stroke.  With the DASH eating plan, you should limit salt (sodium) intake to 2,300 mg a day. If you have hypertension, you may need to reduce your sodium intake to 1,500 mg  a day.  When on the DASH eating plan, aim to eat more fresh fruits and vegetables, whole grains, lean proteins, low-fat dairy, and heart-healthy fats.  Work with your health care provider or diet and nutrition specialist (dietitian) to adjust your eating plan to your individual calorie needs. This information is not intended to replace advice given to you by your health care provider. Make sure you discuss any questions you have with your health care provider. Document Released: 04/06/2011 Document Revised: 04/10/2016 Document Reviewed: 04/10/2016 Elsevier Interactive Patient Education  [employer].    If you need a refill on your cardiac medications before your next appointment, please call your pharmacy.      Signed, Daune Perch, NP  01/01/2018 9:55 AM    [facility]

## 2018-01-03 ENCOUNTER — Encounter: Payer: Self-pay | Admitting: Endocrinology

## 2018-01-07 ENCOUNTER — Ambulatory Visit
Admission: RE | Admit: 2018-01-07 | Discharge: 2018-01-07 | Disposition: A | Discharge status: Home / Self Care | Payer: Medicare HMO | Source: Ambulatory Visit | Attending: Endocrinology | Admitting: Endocrinology

## 2018-01-07 DIAGNOSIS — M7061 Trochanteric bursitis, right hip: Secondary | ICD-10-CM | POA: Diagnosis not present

## 2018-01-07 DIAGNOSIS — E042 Nontoxic multinodular goiter: Secondary | ICD-10-CM | POA: Diagnosis not present

## 2018-01-07 DIAGNOSIS — G894 Chronic pain syndrome: Secondary | ICD-10-CM | POA: Diagnosis not present

## 2018-01-07 DIAGNOSIS — M533 Sacrococcygeal disorders, not elsewhere classified: Secondary | ICD-10-CM | POA: Diagnosis not present

## 2018-01-07 DIAGNOSIS — M47816 Spondylosis without myelopathy or radiculopathy, lumbar region: Secondary | ICD-10-CM | POA: Diagnosis not present

## 2018-01-08 ENCOUNTER — Encounter: Payer: Self-pay | Admitting: Endocrinology

## 2018-01-11 ENCOUNTER — Other Ambulatory Visit: Payer: Self-pay | Admitting: Oncology

## 2018-01-11 ENCOUNTER — Other Ambulatory Visit: Payer: Self-pay | Admitting: Physician Assistant

## 2018-01-11 DIAGNOSIS — M961 Postlaminectomy syndrome, not elsewhere classified: Secondary | ICD-10-CM

## 2018-01-11 DIAGNOSIS — G8929 Other chronic pain: Secondary | ICD-10-CM

## 2018-01-11 DIAGNOSIS — M5441 Lumbago with sciatica, right side: Principal | ICD-10-CM

## 2018-01-14 ENCOUNTER — Other Ambulatory Visit: Payer: Self-pay | Admitting: Nurse Practitioner

## 2018-01-14 MED ORDER — FENOFIBRATE 145 MG PO TABS: 145.0000 mg | ORAL_TABLET | Freq: Every day | ORAL | 3 refills | Status: DC

## 2018-01-16 ENCOUNTER — Ambulatory Visit
Admission: RE | Admit: 2018-01-16 | Discharge: 2018-01-16 | Disposition: A | Discharge status: Home / Self Care | Payer: Medicare HMO | Source: Ambulatory Visit | Attending: Oncology | Admitting: Oncology

## 2018-01-16 DIAGNOSIS — M5126 Other intervertebral disc displacement, lumbar region: Secondary | ICD-10-CM | POA: Diagnosis not present

## 2018-01-16 DIAGNOSIS — G8929 Other chronic pain: Secondary | ICD-10-CM

## 2018-01-16 DIAGNOSIS — M961 Postlaminectomy syndrome, not elsewhere classified: Secondary | ICD-10-CM

## 2018-01-16 DIAGNOSIS — M48061 Spinal stenosis, lumbar region without neurogenic claudication: Secondary | ICD-10-CM | POA: Diagnosis not present

## 2018-01-16 DIAGNOSIS — M5441 Lumbago with sciatica, right side: Principal | ICD-10-CM

## 2018-01-18 MED ORDER — FENOFIBRATE 145 MG PO TABS: 145.0000 mg | ORAL_TABLET | Freq: Every day | ORAL | 3 refills | Status: DC

## 2018-01-18 NOTE — Telephone Encounter (Signed)
Pt's medication was resent to pt's requested pharmacy. Confirmation received.  °

## 2018-01-29 DIAGNOSIS — F319 Bipolar disorder, unspecified: Secondary | ICD-10-CM | POA: Diagnosis not present

## 2018-02-07 ENCOUNTER — Encounter: Payer: Self-pay | Admitting: Family Medicine

## 2018-02-08 ENCOUNTER — Encounter: Payer: Self-pay | Admitting: Family Medicine

## 2018-02-08 NOTE — Telephone Encounter (Signed)
Pt would like to see if a rx can be written for her to have the shingles vaccine done at her pharmacy in the absences of Dr. Brigitte Pulse? She will come in on Monday for her flu shot. Please advise.

## 2018-02-11 ENCOUNTER — Ambulatory Visit (INDEPENDENT_AMBULATORY_CARE_PROVIDER_SITE_OTHER): Payer: Medicare HMO | Admitting: Physician Assistant

## 2018-02-11 DIAGNOSIS — M19031 Primary osteoarthritis, right wrist: Secondary | ICD-10-CM | POA: Diagnosis not present

## 2018-02-11 DIAGNOSIS — S63501A Unspecified sprain of right wrist, initial encounter: Secondary | ICD-10-CM | POA: Diagnosis not present

## 2018-02-11 DIAGNOSIS — M67911 Unspecified disorder of synovium and tendon, right shoulder: Secondary | ICD-10-CM | POA: Diagnosis not present

## 2018-02-11 DIAGNOSIS — Z23 Encounter for immunization: Secondary | ICD-10-CM

## 2018-02-12 ENCOUNTER — Telehealth: Payer: Self-pay | Admitting: Physician Assistant

## 2018-02-12 MED ORDER — ZOSTER VAC RECOMB ADJUVANTED 50 MCG/0.5ML IM SUSR: 0.5000 mL | Freq: Once | INTRAMUSCULAR | 0 refills | Status: DC

## 2018-02-12 NOTE — Telephone Encounter (Signed)
Sent electronically to pharmacy

## 2018-02-12 NOTE — Telephone Encounter (Signed)
Copied from [place] [id number]. Topic: General - Other >> Feb 11, 2018 11:12 AM Leward Quan A wrote: Reason for CRM: Patient called to say that she forgot to get an Rx for a Shingles vaccine but if it could be mailed to her or sent to [facility] [place] [phone] (Phone) [phone] (Fax)  Also request a call back at Ph# [phone]

## 2018-02-12 NOTE — Telephone Encounter (Signed)
Please see note below. 

## 2018-02-21 NOTE — Telephone Encounter (Signed)
Pt called and stated that she would like medication mailed to her or she can come pick it up. Pt states that not all pharmacies have this. Please advise [phone]

## 2018-02-21 NOTE — Telephone Encounter (Signed)
Message sent to Dr. Brigitte Pulse.  Pt req written prescription for Shingles vaccine be sent to her via mail.

## 2018-02-22 ENCOUNTER — Other Ambulatory Visit: Payer: Self-pay | Admitting: Endocrinology

## 2018-02-22 ENCOUNTER — Other Ambulatory Visit: Payer: Self-pay | Admitting: Family Medicine

## 2018-02-22 NOTE — Telephone Encounter (Signed)
Called pt scheduled CPE appointment 06/10/18.

## 2018-02-22 NOTE — Telephone Encounter (Signed)
Requested medication (s) are due for refill today: yes  Requested medication (s) are on the active medication list: yes  Last refill:  Valium 12/02/17   #60 0 refills,  Carvedilol  #180  1 refill  Future visit scheduled: yes 06/10/18  Notes to clinic:      Requested Prescriptions  Pending Prescriptions Disp Refills   diazepam (VALIUM) 5 MG tablet [Pharmacy Med Name: DIAZEPAM 5 MG Tablet] 60 tablet 0    Sig: TAKE 1 TABLET EVERY 8 HOURS AS NEEDED FOR ANXIETY/MUSCLE SPASMS. SEPARATE BY GREATER THAN 4 HOURS FROM ANY SEDATIVE/PAIN MED     Not Delegated - Psychiatry:  Anxiolytics/Hypnotics Failed - 02/22/2018  2:21 PM      Failed - This refill cannot be delegated      Failed - Urine Drug Screen completed in last 360 days.      Failed - Valid encounter within last 6 months    Recent Outpatient Visits          6 months ago Retained tick parts of thigh   [facility], Laurey Arrow, MD   8 months ago Acute bronchitis, unspecified organism   [facility], Laurey Arrow, MD   9 months ago Annual physical exam   [facility], Laurey Arrow, MD   11 months ago Chronic bronchitis with acute exacerbation [facility])   [facility], Laurey Arrow, MD   1 year ago Acute bronchitis, unspecified organism   [facility], Lilia Argue, MD      Future Appointments            In 3 months  [facility] at [place], [facility]   In 3 months Shawnee Knapp, MD [facility] at [place], [place]          carvedilol (COREG) 3.125 MG tablet [Pharmacy Med Name: CARVEDILOL 3.125 MG Tablet] 180 tablet 1    Sig: TAKE 1 TABLET TWICE DAILY     Cardiovascular:  Beta Blockers Failed - 02/22/2018  2:21 PM      Failed - Valid encounter within last 6 months    Recent Outpatient Visits          6 months ago Retained tick parts of thigh   [facility], Laurey Arrow, MD   8 months ago Acute bronchitis, unspecified organism   [facility], Laurey Arrow, MD   9 months ago  Annual physical exam   [facility], Laurey Arrow, MD   11 months ago Chronic bronchitis with acute exacerbation [facility])   [facility], Laurey Arrow, MD   1 year ago Acute bronchitis, unspecified organism   [facility], Lilia Argue, MD      Future Appointments            In 3 months  Primary Care at [place], [facility]   In 3 months Shawnee Knapp, MD Primary Care at [facility], [place] BP in normal range    BP Readings from Last 1 Encounters:  01/01/18 106/64         Passed - Last Heart Rate in normal range    Pulse Readings from Last 1 Encounters:  01/01/18 67

## 2018-02-23 MED ORDER — ZOSTER VAC RECOMB ADJUVANTED 50 MCG/0.5ML IM SUSR: 0.5000 mL | Freq: Once | INTRAMUSCULAR | 1 refills | Status: AC

## 2018-02-23 NOTE — Addendum Note (Signed)
Addended by: Shawnee Knapp on: 02/23/2018 07:05 AM   Modules accepted: Orders

## 2018-02-23 NOTE — Telephone Encounter (Signed)
rx printed. Please put in my box for my sig

## 2018-02-23 NOTE — Telephone Encounter (Signed)
Today I have utilized the [place] Controlled Substance Registry's online query to confirm compliance regarding the patient's controlled medications.  Rechecks will occur regularly.  My review reveals pt is receiving oxycodone-APAP 10-325 mg #120/month from pain management Ms.Bartlett PA from [facility] in [place]. They are also rxing her tizanidine 4mg  1 tab qam and 2-3 tabs po qhs #120/mo  She last had rx diazepam 5mg  #60 from me written 12/02/17, filled 12/11/17.  However, prior to that only received #60 diazepam 5mg  on 12/01/16 (written 11/23/16) and 05/19/16 (written 05/09/16) So this request just 2.5 mos after the prior is a sig increase in use when prior rxs lasted 6-12 mos. sent in refill but noted that patient needs office visit for any additional refills as if her if increased use of diazepam is due to her back muscle spasms then perhaps it should be prescribed by her pain management specialist but if her increased use is due to worsening anxiety then she needs to follow-up with her psychiatrist for alternative medications.  Patient has appointment scheduled with me on 06/10/2018.  patient's last pain management note on 01/07/2018 notes that her pain and function is unchanged since her prior visit on 09/07/2017 but then goes on to state that patient has now developed left buttock, thigh, and arch of foot pain and numbness that is worse when seated.  Failed bilateral SI joint injections on 10/12/2017.  Her pain management office does have diazepam 5 mg on her med list with sig: take 5 to 10 mg by mouth.  Is also on Cymbalta and Neurontin for pain control and uses TENS unit.  They did order a lumbar MRI at that visit and also noted their review of the [place] controlled substance registry.  However at her cardiology office visit just 6 days prior on 9/319 the in P does not note that patient is complaining of continuing worsening back pain since her surgery 2 years ago and is unable to walk any  significant distance due to right leg pain.  Her pain management office did collect a UDS on 06/06/2017 that was noted to be negative for oxycodone but had oral swab on 09/07/2017 that was appropriately positive for oxycodone.

## 2018-03-04 DIAGNOSIS — M25531 Pain in right wrist: Secondary | ICD-10-CM | POA: Diagnosis not present

## 2018-03-04 DIAGNOSIS — M67911 Unspecified disorder of synovium and tendon, right shoulder: Secondary | ICD-10-CM | POA: Diagnosis not present

## 2018-03-05 DIAGNOSIS — G894 Chronic pain syndrome: Secondary | ICD-10-CM | POA: Diagnosis not present

## 2018-03-05 DIAGNOSIS — M5441 Lumbago with sciatica, right side: Secondary | ICD-10-CM | POA: Diagnosis not present

## 2018-03-05 DIAGNOSIS — G8929 Other chronic pain: Secondary | ICD-10-CM | POA: Diagnosis not present

## 2018-03-13 ENCOUNTER — Encounter: Payer: Self-pay | Admitting: Family Medicine

## 2018-03-13 ENCOUNTER — Telehealth: Payer: Self-pay | Admitting: Family Medicine

## 2018-03-13 NOTE — Telephone Encounter (Signed)
Copied from [id number]. Topic: General - Other >> Mar 13, 2018 12:54 PM Carolyn Stare wrote:  Pt is calling to ask if the shingles RX was ever mailed to her

## 2018-03-15 ENCOUNTER — Encounter: Payer: Self-pay | Admitting: Family Medicine

## 2018-03-18 ENCOUNTER — Encounter: Payer: Self-pay | Admitting: Acute Care

## 2018-03-18 ENCOUNTER — Telehealth: Payer: Self-pay | Admitting: Acute Care

## 2018-03-18 NOTE — Telephone Encounter (Signed)
Will route to [facility] to follow up on.

## 2018-03-20 NOTE — Telephone Encounter (Signed)
[name], can you call pt to scheduled her yearly LDCT?

## 2018-03-20 NOTE — Telephone Encounter (Signed)
The patient has been scheduled on 04/12/2018 @ 2:00pm and she is aware of the appt and location

## 2018-03-21 ENCOUNTER — Encounter: Payer: Self-pay | Admitting: Family Medicine

## 2018-03-22 ENCOUNTER — Other Ambulatory Visit: Payer: Self-pay

## 2018-03-22 NOTE — Progress Notes (Unsigned)
sS

## 2018-03-25 ENCOUNTER — Other Ambulatory Visit: Payer: Self-pay | Admitting: Family Medicine

## 2018-03-27 ENCOUNTER — Encounter: Payer: Self-pay | Admitting: Family Medicine

## 2018-04-01 DIAGNOSIS — M67911 Unspecified disorder of synovium and tendon, right shoulder: Secondary | ICD-10-CM | POA: Diagnosis not present

## 2018-04-01 DIAGNOSIS — M25511 Pain in right shoulder: Secondary | ICD-10-CM | POA: Diagnosis not present

## 2018-04-01 DIAGNOSIS — M25531 Pain in right wrist: Secondary | ICD-10-CM | POA: Diagnosis not present

## 2018-04-02 DIAGNOSIS — M533 Sacrococcygeal disorders, not elsewhere classified: Secondary | ICD-10-CM | POA: Diagnosis not present

## 2018-04-06 ENCOUNTER — Encounter: Payer: Self-pay | Admitting: Family Medicine

## 2018-04-12 ENCOUNTER — Ambulatory Visit (INDEPENDENT_AMBULATORY_CARE_PROVIDER_SITE_OTHER)
Admission: RE | Admit: 2018-04-12 | Discharge: 2018-04-12 | Disposition: A | Discharge status: Home / Self Care | Payer: Medicare HMO | Source: Ambulatory Visit | Attending: Acute Care | Admitting: Acute Care

## 2018-04-12 DIAGNOSIS — F17210 Nicotine dependence, cigarettes, uncomplicated: Secondary | ICD-10-CM | POA: Diagnosis not present

## 2018-04-12 DIAGNOSIS — Z122 Encounter for screening for malignant neoplasm of respiratory organs: Secondary | ICD-10-CM

## 2018-04-15 ENCOUNTER — Encounter: Payer: Self-pay | Admitting: Family Medicine

## 2018-04-15 MED ORDER — FAMOTIDINE 20 MG PO TABS: 20.0000 mg | ORAL_TABLET | Freq: Two times a day (BID) | ORAL | 1 refills | Status: DC

## 2018-04-17 DIAGNOSIS — M25531 Pain in right wrist: Secondary | ICD-10-CM | POA: Diagnosis not present

## 2018-04-19 ENCOUNTER — Other Ambulatory Visit: Payer: Self-pay | Admitting: Acute Care

## 2018-04-19 DIAGNOSIS — Z122 Encounter for screening for malignant neoplasm of respiratory organs: Secondary | ICD-10-CM

## 2018-04-19 DIAGNOSIS — F17210 Nicotine dependence, cigarettes, uncomplicated: Secondary | ICD-10-CM

## 2018-04-19 DIAGNOSIS — Z87891 Personal history of nicotine dependence: Secondary | ICD-10-CM

## 2018-04-30 DIAGNOSIS — Z79899 Other long term (current) drug therapy: Secondary | ICD-10-CM | POA: Diagnosis not present

## 2018-04-30 DIAGNOSIS — M7061 Trochanteric bursitis, right hip: Secondary | ICD-10-CM | POA: Diagnosis not present

## 2018-04-30 DIAGNOSIS — Z5181 Encounter for therapeutic drug level monitoring: Secondary | ICD-10-CM | POA: Diagnosis not present

## 2018-04-30 DIAGNOSIS — G894 Chronic pain syndrome: Secondary | ICD-10-CM | POA: Diagnosis not present

## 2018-04-30 DIAGNOSIS — M47816 Spondylosis without myelopathy or radiculopathy, lumbar region: Secondary | ICD-10-CM | POA: Diagnosis not present

## 2018-04-30 DIAGNOSIS — M961 Postlaminectomy syndrome, not elsewhere classified: Secondary | ICD-10-CM | POA: Diagnosis not present

## 2018-05-06 ENCOUNTER — Other Ambulatory Visit: Payer: Self-pay

## 2018-05-06 ENCOUNTER — Encounter: Payer: Self-pay | Admitting: Family Medicine

## 2018-05-06 ENCOUNTER — Ambulatory Visit: Payer: Medicare HMO | Admitting: Family Medicine

## 2018-05-06 VITALS — BP 97/61 | HR 71 | Temp 98.5°F | Resp 14 | Ht 61.5 in | Wt 153.6 lb

## 2018-05-06 DIAGNOSIS — J040 Acute laryngitis: Secondary | ICD-10-CM

## 2018-05-06 DIAGNOSIS — R059 Cough, unspecified: Secondary | ICD-10-CM

## 2018-05-06 DIAGNOSIS — R05 Cough: Secondary | ICD-10-CM | POA: Diagnosis not present

## 2018-05-06 DIAGNOSIS — J069 Acute upper respiratory infection, unspecified: Secondary | ICD-10-CM

## 2018-05-06 MED ORDER — IPRATROPIUM BROMIDE 0.06 % NA SOLN: 1.0000 | Freq: Four times a day (QID) | NASAL | Status: DC | PRN

## 2018-05-06 MED ORDER — IPRATROPIUM BROMIDE 0.06 % NA SOLN: 1.0000 | Freq: Four times a day (QID) | NASAL | 0 refills | Status: DC

## 2018-05-06 NOTE — Patient Instructions (Addendum)
Make sure to drink plenty of fluids, voice rest and mucinex may help with cough or tessalon during the day.  Some of the cough at night may be due to postnasal drip or congestion.  Can try ipratropium nasal spray up to 4 times per day, especially before going to bed.  Cepacol cough drops may also help.  Return to the clinic or go to the nearest emergency room if any of your symptoms worsen or new symptoms occur.   Cough, Adult  Coughing is a reflex that clears your throat and your airways. Coughing helps to heal and protect your lungs. It is normal to cough occasionally, but a cough that happens with other symptoms or lasts a long time may be a sign of a condition that needs treatment. A cough may last only 2-3 weeks (acute), or it may last longer than 8 weeks (chronic). What are the causes? Coughing is commonly caused by:  Breathing in substances that irritate your lungs.  A viral or bacterial respiratory infection.  Allergies.  Asthma.  Postnasal drip.  Smoking.  Acid backing up from the stomach into the esophagus (gastroesophageal reflux).  Certain medicines.  Chronic lung problems, including COPD (or rarely, lung cancer).  Other medical conditions such as heart failure. Follow these instructions at home: Pay attention to any changes in your symptoms. Take these actions to help with your discomfort:  Take medicines only as told by your health care provider. ? If you were prescribed an antibiotic medicine, take it as told by your health care provider. Do not stop taking the antibiotic even if you start to feel better. ? Talk with your health care provider before you take a cough suppressant medicine.  Drink enough fluid to keep your urine clear or pale yellow.  If the air is dry, use a cold steam vaporizer or humidifier in your bedroom or your home to help loosen secretions.  Avoid anything that causes you to cough at work or at home.  If your cough is worse at night,  try sleeping in a semi-upright position.  Avoid cigarette smoke. If you smoke, quit smoking. If you need help quitting, ask your health care provider.  Avoid caffeine.  Avoid alcohol.  Rest as needed. Contact a health care provider if:  You have new symptoms.  You cough up pus.  Your cough does not get better after 2-3 weeks, or your cough gets worse.  You cannot control your cough with suppressant medicines and you are losing sleep.  You develop pain that is getting worse or pain that is not controlled with pain medicines.  You have a fever.  You have unexplained weight loss.  You have night sweats. Get help right away if:  You cough up blood.  You have difficulty breathing.  Your heartbeat is very fast. This information is not intended to replace advice given to you by your health care provider. Make sure you discuss any questions you have with your health care provider. Document Released: 10/14/2010 Document Revised: 09/23/2015 Document Reviewed: 06/24/2014 Elsevier Interactive Patient Education  [employer].   If you have lab work done today you will be contacted with your lab results within the next 2 weeks.  If you have not heard from [place] then please contact us. The fastest way to get your results is to register for My Chart.   IF you received an x-ray today, you will receive an invoice from [facility]. Please contact [facility] at [phone]  with questions or concerns regarding your invoice.   IF you received labwork today, you will receive an invoice from [facility]. Please contact [facility] at [phone] with questions or concerns regarding your invoice.   Our billing staff will not be able to assist you with questions regarding bills from these companies.  You will be contacted with the lab results as soon as they are available. The fastest way to get your results is to activate your My Chart account. Instructions are located on the  last page of this paperwork. If you have not heard from [place] regarding the results in 2 weeks, please contact this office.

## 2018-05-06 NOTE — Progress Notes (Signed)
Subjective:    Patient ID: [name], female    DOB: July 10, 1960, 58 y.o.   MRN: [id number]  HPI Keaton Stirewalt Lopez-Valencia is a 58 y.o. female Presents today for: Chief Complaint  Patient presents with  . URI    cough, chest congestion, running nose x 1wk.    Started 1 week ago with cough, sneezing. Then progressed to congestion., runny nose. Cough is worst part s keeps up at night. Lost voice 2 days ago. Min wheeze currently. Using inhaler intermittently only - not daily.  Multiple sick contacts.  No recent fever - borderline for initial 2 days only. No bodyaches.  Had flu vaccine this year.   Drinking fluids. No near syncope or lightheadedness symptoms.  Cough is somewhat improved and drying up.   On oxycodone 10/325 about 4 times per day for chronic back pain.   Tx:  Tessalon perles tid - min relief. More cough than wheeze at night.     Patient Active Problem List   Diagnosis Date Noted  . Gastroesophageal reflux disease 05/13/2017  . Polycythemia, secondary 05/13/2017  . Radiculopathy 10/21/2015  . Diabetes ([place]) 07/29/2015  . 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 Multinodular goiter 09/15/2013  . Carotid bruit   . Depression   . Coronary artery disease   . Hypertension   . Hyperlipidemia   . Neuromuscular disorder ([place])   . Arthritis   . DDD (degenerative disc disease), lumbar   . Ejection fraction   . Hx of CABG   . Encounter for long-term (current) use of other medications 02/08/2012  . Spinal stenosis of lumbar region 02/08/2012  . Neuropathy 02/08/2012  . Superficial phlebitis and thrombophlebitis of the leg 04/09/2011  . Back pain, chronic   . TOBACCO ABUSE 11/20/2008   Past Medical History:  Diagnosis Date  . Allergy   . Anxiety   . Arthritis   . Back pain, chronic   . Bipolar disorder ([place])   . Bronchitis    hx-no inhalers at home  . Carotid bruit   . Carpal tunnel syndrome on both sides   . Coronary artery  disease   . DDD (degenerative disc disease), lumbar   . Depression   . Diabetes mellitus    diet controlled  . Ejection fraction    .  Marland Kitchen GERD (gastroesophageal reflux disease)   . Hx of CABG   . Hypercholesteremia   . Hypertension   . Hypothyroidism   . Multiple thyroid nodules   . Myocardial infarction ([place]) 11/20/06  . Neuromuscular disorder ([place])   . Nodule of neck    Past Surgical History:  Procedure Laterality Date  . ABDOMINAL HYSTERECTOMY  1984  . BREAST EXCISIONAL BIOPSY Right    benign  . BREAST EXCISIONAL BIOPSY Left    benign  . BREAST SURGERY     rt br cystx2  . CORONARY ARTERY BYPASS GRAFT  2008  . KNEE SURGERY  2009   lt   . ORIF ANKLE FRACTURE Right 07/12/2012   Procedure: OPEN REDUCTION INTERNAL FIXATION (ORIF) RIGHT ANKLE FRACTURE;  Surgeon: Alta Corning, MD;  Location: [facility];  Service: Orthopedics;  Laterality: Right;  . SHOULDER SURGERY  11/08/2016  . TUBAL LIGATION    . WRIST SURGERY Left    Allergies  Allergen Reactions  . Coconut Flavor Swelling and Rash  . Ibuprofen Swelling and Rash  . Prednisone Swelling and Rash  . Cyclobenzaprine   . Morphine Nausea And Vomiting  Prior to Admission medications   Medication Sig Start Date End Date Taking? Authorizing Provider  acarbose (PRECOSE) 25 MG tablet TAKE 1 TABLET THREE TIMES DAILY WITH MEALS 02/23/18  Yes Renato Shin, MD  ACCU-CHEK FASTCLIX LANCETS MISC Use to check blood sugar 3 times daily. 08/15/16  Yes Renato Shin, MD  aspirin EC 81 MG tablet Take 81 mg by mouth 2 (two) times daily.   Yes [provider]  Bisacodyl (DULCOLAX PO) Take 1 tablet by mouth 2 (two) times daily.    Yes [provider]  Bromocriptine Mesylate 0.8 MG TABS Take 1 tablet (0.8 mg total) by mouth daily. 06/14/17  Yes Renato Shin, MD  carvedilol (COREG) 3.125 MG tablet TAKE 1 TABLET TWICE DAILY 02/23/18  Yes Shawnee Knapp, MD  diazepam (VALIUM) 5 MG tablet Take 1 tablet (5 mg total) by  mouth every 12 (twelve) hours as needed for anxiety or muscle spasms. **NEED OFFICE VISIT FOR REFILL** 02/23/18  Yes Shawnee Knapp, MD  DULoxetine (CYMBALTA) 60 MG capsule Take 60 mg by mouth 2 (two) times daily.   Yes [provider]  empagliflozin (JARDIANCE) 10 MG TABS tablet Take 10 mg by mouth daily. 08/23/17  Yes Renato Shin, MD  famotidine (PEPCID) 20 MG tablet Take 1 tablet (20 mg total) by mouth 2 (two) times daily. 04/15/18  Yes Shawnee Knapp, MD  fenofibrate (TRICOR) 145 MG tablet Take 1 tablet (145 mg total) by mouth daily. 01/18/18  Yes Nahser, Wonda Cheng, MD  fluticasone (FLONASE) 50 MCG/ACT nasal spray Place 2 sprays into both nostrils at bedtime. 03/24/17  Yes Shawnee Knapp, MD  gabapentin (NEURONTIN) 800 MG tablet Take 1 tablet (800 mg total) by mouth 4 (four) times daily. 10/12/17  Yes Shawnee Knapp, MD  glucose blood (ACCU-CHEK GUIDE) test strip Use to check blood sugar 3 times daily. 08/15/16  Yes Renato Shin, MD  Ipratropium-Albuterol (COMBIVENT RESPIMAT) 20-100 MCG/ACT AERS respimat Inhale 1 puff into the lungs every 6 (six) hours. 05/10/17  Yes Shawnee Knapp, MD  levothyroxine (SYNTHROID, LEVOTHROID) 100 MCG tablet Take 1 tablet (100 mcg total) by mouth daily before breakfast. 10/10/17  Yes Renato Shin, MD  metFORMIN (GLUCOPHAGE) 1000 MG tablet TAKE 1 TABLET BY MOUTH DAILY WITH BREAKFAST. 07/31/17  Yes Shawnee Knapp, MD  Methylnaltrexone Bromide (RELISTOR) 150 MG TABS Take 3 tablets by mouth daily. With water >30 min before first mea 07/31/17  Yes Shawnee Knapp, MD  Omega-3 Fatty Acids (FISH OIL) 1000 MG CAPS Take by mouth.   Yes [provider]  omeprazole (PRILOSEC) 40 MG capsule TAKE 1 CAPSULE DAILY 30 MINUTES BEFORE A MEAL 05/10/17  Yes Shawnee Knapp, MD  oxyCODONE-acetaminophen (PERCOCET) 10-325 MG tablet Take 1 tablet by mouth every 4 (four) hours as needed for pain.   Yes [provider]  rosuvastatin (CRESTOR) 20 MG tablet Take 1 tablet (20 mg total) by mouth daily.  12/07/17  Yes Shawnee Knapp, MD  tiZANidine (ZANAFLEX) 4 MG tablet Take 4 mg by mouth every 6 (six) hours as needed for muscle spasms.   Yes [provider]  traZODone (DESYREL) 100 MG tablet Take 1 tablet (100 mg total) by mouth at bedtime as needed for sleep. 11/24/16  Yes Shawnee Knapp, MD   Social History   Socioeconomic History  . Marital status: Married    Spouse name: Not on file  . Number of children: 2  . Years of education: Not on file  .  Highest education level: 11th grade  Occupational History  . Not on file  Social Needs  . Financial resource strain: Not hard at all  . Food insecurity:    Worry: Never true    Inability: Never true  . Transportation needs:    Medical: No    Non-medical: No  Tobacco Use  . Smoking status: Current Every Day Smoker    Packs/day: 1.00    Years: 33.00    Pack years: 33.00    Types: Cigarettes  . Smokeless tobacco: Never Used  Substance and Sexual Activity  . Alcohol use: No  . Drug use: No  . Sexual activity: Not on file  Lifestyle  . Physical activity:    Days per week: 0 days    Minutes per session: 0 min  . Stress: To some extent  Relationships  . Social connections:    Talks on phone: Once a week    Gets together: Once a week    Attends religious service: Never    Active member of club or organization: No    Attends meetings of clubs or organizations: Never    Relationship status: Married  . Intimate partner violence:    Fear of current or ex partner: No    Emotionally abused: No    Physically abused: No    Forced sexual activity: No  Other Topics Concern  . Not on file  Social History Narrative  . Not on file    Review of Systems     Objective:   Physical Exam Vitals:   05/06/18 1421  BP: 97/61  Pulse: 71  Resp: 14  Temp: 98.5 F (36.9 C)  TempSrc: Oral  SpO2: 96%  Weight: 153 lb 9.6 oz (69.7 kg)  Height: 5' 1.5" (1.562 m)  .  BP Readings from Last 3 Encounters:  05/06/18 97/61  01/01/18 106/64   12/27/17 110/60         Assessment & Plan:    Reisa Coppola Lopez-Valencia is a 58 y.o. female Acute upper respiratory infection - Plan: ipratropium (ATROVENT) 0.06 % nasal spray, DISCONTINUED: ipratropium (ATROVENT) 0.06 % nasal spray 1-2 spray  Laryngitis - Plan: ipratropium (ATROVENT) 0.06 % nasal spray  Cough - Plan: ipratropium (ATROVENT) 0.06 % nasal spray, DISCONTINUED: ipratropium (ATROVENT) 0.06 % nasal spray 1-2 spray  Suspected viral syndrome with some slight improvement recently.  Primarily nighttime cough, suspected postnasal drip.   -  Based on current medication regimen, will avoid additional narcotic cough suppressant.    - Will try Atrovent nasal spray to dry secretions/decrease secretions at night, okay to continue Tessalon, Cepacol cough drops if needed, and RTC precautions given.  Meds ordered this encounter  Medications  . DISCONTD: ipratropium (ATROVENT) 0.06 % nasal spray 1-2 spray  . ipratropium (ATROVENT) 0.06 % nasal spray    Sig: Place 1-2 sprays into both nostrils 4 (four) times daily.    Dispense:  15 mL    Refill:  0   Patient Instructions     Make sure to drink plenty of fluids, voice rest and mucinex may help with cough or tessalon during the day.  Some of the cough at night may be due to postnasal drip or congestion.  Can try ipratropium nasal spray up to 4 times per day, especially before going to bed.  Cepacol cough drops may also help.  Return to the clinic or go to the nearest emergency room if any of your symptoms worsen or new symptoms occur.  Cough, Adult  Coughing is a reflex that clears your throat and your airways. Coughing helps to heal and protect your lungs. It is normal to cough occasionally, but a cough that happens with other symptoms or lasts a long time may be a sign of a condition that needs treatment. A cough may last only 2-3 weeks (acute), or it may last longer than 8 weeks (chronic). What are the causes? Coughing is  commonly caused by:  Breathing in substances that irritate your lungs.  A viral or bacterial respiratory infection.  Allergies.  Asthma.  Postnasal drip.  Smoking.  Acid backing up from the stomach into the esophagus (gastroesophageal reflux).  Certain medicines.  Chronic lung problems, including COPD (or rarely, lung cancer).  Other medical conditions such as heart failure. Follow these instructions at home: Pay attention to any changes in your symptoms. Take these actions to help with your discomfort:  Take medicines only as told by your health care provider. ? If you were prescribed an antibiotic medicine, take it as told by your health care provider. Do not stop taking the antibiotic even if you start to feel better. ? Talk with your health care provider before you take a cough suppressant medicine.  Drink enough fluid to keep your urine clear or pale yellow.  If the air is dry, use a cold steam vaporizer or humidifier in your bedroom or your home to help loosen secretions.  Avoid anything that causes you to cough at work or at home.  If your cough is worse at night, try sleeping in a semi-upright position.  Avoid cigarette smoke. If you smoke, quit smoking. If you need help quitting, ask your health care provider.  Avoid caffeine.  Avoid alcohol.  Rest as needed. Contact a health care provider if:  You have new symptoms.  You cough up pus.  Your cough does not get better after 2-3 weeks, or your cough gets worse.  You cannot control your cough with suppressant medicines and you are losing sleep.  You develop pain that is getting worse or pain that is not controlled with pain medicines.  You have a fever.  You have unexplained weight loss.  You have night sweats. Get help right away if:  You cough up blood.  You have difficulty breathing.  Your heartbeat is very fast. This information is not intended to replace advice given to you by your health  care provider. Make sure you discuss any questions you have with your health care provider. Document Released: 10/14/2010 Document Revised: 09/23/2015 Document Reviewed: 06/24/2014 Elsevier Interactive Patient Education  [employer].   If you have lab work done today you will be contacted with your lab results within the next 2 weeks.  If you have not heard from [place] then please contact us. The fastest way to get your results is to register for My Chart.   IF you received an x-ray today, you will receive an invoice from [facility]. Please contact [facility] at [phone] with questions or concerns regarding your invoice.   IF you received labwork today, you will receive an invoice from [facility]. Please contact [facility] at [phone] with questions or concerns regarding your invoice.   Our billing staff will not be able to assist you with questions regarding bills from these companies.  You will be contacted with the lab results as soon as they are available. The fastest way to get your results is to activate your My Chart account. Instructions are  located on the last page of this paperwork. If you have not heard from [place] regarding the results in 2 weeks, please contact this office.       Signed,   Merri Ray, MD [facility].  05/07/18 1:25 PM

## 2018-05-08 ENCOUNTER — Encounter: Payer: Self-pay | Admitting: Family Medicine

## 2018-05-08 DIAGNOSIS — M5440 Lumbago with sciatica, unspecified side: Principal | ICD-10-CM

## 2018-05-08 DIAGNOSIS — G894 Chronic pain syndrome: Secondary | ICD-10-CM

## 2018-05-08 DIAGNOSIS — G8929 Other chronic pain: Secondary | ICD-10-CM

## 2018-05-16 DIAGNOSIS — F319 Bipolar disorder, unspecified: Secondary | ICD-10-CM | POA: Diagnosis not present

## 2018-05-17 ENCOUNTER — Telehealth: Payer: Self-pay | Admitting: Family Medicine

## 2018-05-17 NOTE — Telephone Encounter (Signed)
MyChart message sent to pt about their appointment on 06/10/18 with Dr Brigitte Pulse.

## 2018-05-21 DIAGNOSIS — Z79899 Other long term (current) drug therapy: Secondary | ICD-10-CM | POA: Diagnosis not present

## 2018-05-21 DIAGNOSIS — M545 Low back pain: Secondary | ICD-10-CM | POA: Diagnosis not present

## 2018-05-21 DIAGNOSIS — M129 Arthropathy, unspecified: Secondary | ICD-10-CM | POA: Diagnosis not present

## 2018-05-21 DIAGNOSIS — M546 Pain in thoracic spine: Secondary | ICD-10-CM | POA: Diagnosis not present

## 2018-05-22 ENCOUNTER — Other Ambulatory Visit: Payer: Self-pay

## 2018-05-22 ENCOUNTER — Encounter: Payer: Self-pay | Admitting: Emergency Medicine

## 2018-05-22 ENCOUNTER — Ambulatory Visit: Payer: Medicare HMO | Admitting: Emergency Medicine

## 2018-05-22 VITALS — BP 110/61 | HR 70 | Temp 98.3°F | Resp 16 | Ht 62.0 in | Wt 150.6 lb

## 2018-05-22 DIAGNOSIS — J22 Unspecified acute lower respiratory infection: Secondary | ICD-10-CM

## 2018-05-22 DIAGNOSIS — R05 Cough: Secondary | ICD-10-CM | POA: Diagnosis not present

## 2018-05-22 DIAGNOSIS — R059 Cough, unspecified: Secondary | ICD-10-CM

## 2018-05-22 DIAGNOSIS — R0989 Other specified symptoms and signs involving the circulatory and respiratory systems: Secondary | ICD-10-CM

## 2018-05-22 MED ORDER — PSEUDOEPHEDRINE-GUAIFENESIN ER 60-600 MG PO TB12: 1.0000 | ORAL_TABLET | Freq: Two times a day (BID) | ORAL | 1 refills | Status: AC

## 2018-05-22 MED ORDER — AZITHROMYCIN 250 MG PO TABS: ORAL_TABLET | ORAL | 0 refills | Status: DC

## 2018-05-22 MED ORDER — PROMETHAZINE-DM 6.25-15 MG/5ML PO SYRP: 5.0000 mL | ORAL_SOLUTION | Freq: Four times a day (QID) | ORAL | 0 refills | Status: DC | PRN

## 2018-05-22 NOTE — Progress Notes (Signed)
[name] 58 y.o.   Chief Complaint  Patient presents with  . Cough    last office visit 05/06/2018  Dr Carlota Raspberry - per patient cough not better and gets worse when sun goes down    HISTORY OF PRESENT ILLNESS: This is a 58 y.o. female complaining of persistent cough and flulike symptoms for the past month.  Complaining of nasal and chest congestion, clear runny nose, and cough.  Smoker. Has multiple chronic medical problems including chronic pain syndrome.  HPI   Prior to Admission medications   Medication Sig Start Date End Date Taking? Authorizing Provider  acarbose (PRECOSE) 25 MG tablet TAKE 1 TABLET THREE TIMES DAILY WITH MEALS 02/23/18  Yes Renato Shin, MD  aspirin EC 81 MG tablet Take 81 mg by mouth 2 (two) times daily.   Yes [provider]  Bisacodyl (DULCOLAX PO) Take 1 tablet by mouth 2 (two) times daily.    Yes [provider]  Bromocriptine Mesylate 0.8 MG TABS Take 1 tablet (0.8 mg total) by mouth daily. 06/14/17  Yes Renato Shin, MD  carvedilol (COREG) 3.125 MG tablet TAKE 1 TABLET TWICE DAILY 02/23/18  Yes Shawnee Knapp, MD  diazepam (VALIUM) 5 MG tablet Take 1 tablet (5 mg total) by mouth every 12 (twelve) hours as needed for anxiety or muscle spasms. **NEED OFFICE VISIT FOR REFILL** 02/23/18  Yes Shawnee Knapp, MD  DULoxetine (CYMBALTA) 60 MG capsule Take 60 mg by mouth 2 (two) times daily.   Yes [provider]  empagliflozin (JARDIANCE) 10 MG TABS tablet Take 10 mg by mouth daily. 08/23/17  Yes Renato Shin, MD  famotidine (PEPCID) 20 MG tablet Take 1 tablet (20 mg total) by mouth 2 (two) times daily. 04/15/18  Yes Shawnee Knapp, MD  fenofibrate (TRICOR) 145 MG tablet Take 1 tablet (145 mg total) by mouth daily. 01/18/18  Yes Nahser, Wonda Cheng, MD  fluticasone (FLONASE) 50 MCG/ACT nasal spray Place 2 sprays into both nostrils at bedtime. 03/24/17  Yes Shawnee Knapp, MD  gabapentin (NEURONTIN) 800 MG tablet Take 1 tablet (800 mg total) by  mouth 4 (four) times daily. 10/12/17  Yes Shawnee Knapp, MD  ipratropium (ATROVENT) 0.06 % nasal spray Place 1-2 sprays into both nostrils 4 (four) times daily. 05/06/18  Yes Wendie Agreste, MD  Ipratropium-Albuterol (COMBIVENT RESPIMAT) 20-100 MCG/ACT AERS respimat Inhale 1 puff into the lungs every 6 (six) hours. 05/10/17  Yes Shawnee Knapp, MD  levothyroxine (SYNTHROID, LEVOTHROID) 100 MCG tablet Take 1 tablet (100 mcg total) by mouth daily before breakfast. 10/10/17  Yes Renato Shin, MD  metFORMIN (GLUCOPHAGE) 1000 MG tablet TAKE 1 TABLET BY MOUTH DAILY WITH BREAKFAST. 07/31/17  Yes Shawnee Knapp, MD  Methylnaltrexone Bromide (RELISTOR) 150 MG TABS Take 3 tablets by mouth daily. With water >30 min before first mea 07/31/17  Yes Shawnee Knapp, MD  Omega-3 Fatty Acids (FISH OIL) 1000 MG CAPS Take by mouth.   Yes [provider]  omeprazole (PRILOSEC) 40 MG capsule TAKE 1 CAPSULE DAILY 30 MINUTES BEFORE A MEAL 05/10/17  Yes Shawnee Knapp, MD  oxyCODONE-acetaminophen (PERCOCET) 10-325 MG tablet Take 1 tablet by mouth every 4 (four) hours as needed for pain.   Yes [provider]  rosuvastatin (CRESTOR) 20 MG tablet Take 1 tablet (20 mg total) by mouth daily. 12/07/17  Yes Shawnee Knapp, MD  tiZANidine (ZANAFLEX) 4 MG tablet Take 4 mg by mouth every 6 (six) hours  as needed for muscle spasms.   Yes [provider]  traZODone (DESYREL) 100 MG tablet Take 1 tablet (100 mg total) by mouth at bedtime as needed for sleep. 11/24/16  Yes Shawnee Knapp, MD  ACCU-CHEK FASTCLIX LANCETS MISC Use to check blood sugar 3 times daily. 08/15/16   Renato Shin, MD  glucose blood (ACCU-CHEK GUIDE) test strip Use to check blood sugar 3 times daily. 08/15/16   Renato Shin, MD    Allergies  Allergen Reactions  . Coconut Flavor Swelling and Rash  . Ibuprofen Swelling and Rash  . Prednisone Swelling and Rash  . Cyclobenzaprine   . Morphine Nausea And Vomiting    Patient Active Problem List   Diagnosis Date  Noted  . Gastroesophageal reflux disease 05/13/2017  . Polycythemia, secondary 05/13/2017  . Radiculopathy 10/21/2015  . Diabetes ([place]) 07/29/2015  . 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 Multinodular goiter 09/15/2013  . Carotid bruit   . Depression   . Coronary artery disease   . Hypertension   . Hyperlipidemia   . Neuromuscular disorder ([employer])   . Arthritis   . DDD (degenerative disc disease), lumbar   . Ejection fraction   . Hx of CABG   . Encounter for long-term (current) use of other medications 02/08/2012  . Spinal stenosis of lumbar region 02/08/2012  . Neuropathy 02/08/2012  . Superficial phlebitis and thrombophlebitis of the leg 04/09/2011  . Back pain, chronic   . TOBACCO ABUSE 11/20/2008    Past Medical History:  Diagnosis Date  . Allergy   . Anxiety   . Arthritis   . Back pain, chronic   . Bipolar disorder ([place])   . Bronchitis    hx-no inhalers at home  . Carotid bruit   . Carpal tunnel syndrome on both sides   . Coronary artery disease   . DDD (degenerative disc disease), lumbar   . Depression   . Diabetes mellitus    diet controlled  . Ejection fraction    .  Marland Kitchen GERD (gastroesophageal reflux disease)   . Hx of CABG   . Hypercholesteremia   . Hypertension   . Hypothyroidism   . Multiple thyroid nodules   . Myocardial infarction ([place]) 11/20/06  . Neuromuscular disorder ([place])   . Nodule of neck     Past Surgical History:  Procedure Laterality Date  . ABDOMINAL HYSTERECTOMY  1984  . BREAST EXCISIONAL BIOPSY Right    benign  . BREAST EXCISIONAL BIOPSY Left    benign  . BREAST SURGERY     rt br cystx2  . CORONARY ARTERY BYPASS GRAFT  2008  . KNEE SURGERY  2009   lt   . ORIF ANKLE FRACTURE Right 07/12/2012   Procedure: OPEN REDUCTION INTERNAL FIXATION (ORIF) RIGHT ANKLE FRACTURE;  Surgeon: Alta Corning, MD;  Location: [facility];  Service: Orthopedics;  Laterality: Right;  . SHOULDER SURGERY  11/08/2016  .  TUBAL LIGATION    . WRIST SURGERY Left     Social History   Socioeconomic History  . Marital status: Married    Spouse name: Not on file  . Number of children: 2  . Years of education: Not on file  . Highest education level: 11th grade  Occupational History  . Not on file  Social Needs  . Financial resource strain: Not hard at all  . Food insecurity:    Worry: Never true    Inability: Never true  . Transportation needs:  Medical: No    Non-medical: No  Tobacco Use  . Smoking status: Current Every Day Smoker    Packs/day: 1.00    Years: 33.00    Pack years: 33.00    Types: Cigarettes  . Smokeless tobacco: Never Used  Substance and Sexual Activity  . Alcohol use: No  . Drug use: No  . Sexual activity: Not on file  Lifestyle  . Physical activity:    Days per week: 0 days    Minutes per session: 0 min  . Stress: To some extent  Relationships  . Social connections:    Talks on phone: Once a week    Gets together: Once a week    Attends religious service: Never    Active member of club or organization: No    Attends meetings of clubs or organizations: Never    Relationship status: Married  . Intimate partner violence:    Fear of current or ex partner: No    Emotionally abused: No    Physically abused: No    Forced sexual activity: No  Other Topics Concern  . Not on file  Social History Narrative  . Not on file    Family History  Problem Relation Age of Onset  . Heart disease Mother   . Hyperlipidemia Mother   . Hypertension Mother   . Heart disease Father   . Stroke Father   . Hypertension Father   . Hyperlipidemia Father   . Hypertension Sister   . Hypertension Brother   . Cancer Daughter        unknown  . Hypertension Maternal Grandmother   . Hypertension Maternal Grandfather   . Cancer Maternal Grandfather   . Hypertension Paternal Grandmother   . Hypertension Paternal Grandfather   . Cancer Maternal Aunt        breast and ovarian  . Cancer  Paternal Aunt        breast  . Colon cancer Neg Hx      Review of Systems  Constitutional: Negative.  Negative for chills and fever.  HENT: Positive for congestion. Negative for ear pain and sore throat.   Eyes: Negative for blurred vision, double vision, discharge and redness.  Respiratory: Positive for cough and sputum production.   Cardiovascular: Negative.  Negative for chest pain and palpitations.  Gastrointestinal: Negative.  Negative for nausea and vomiting.  Genitourinary: Negative.   Skin: Negative.  Negative for rash.  Neurological: Negative for dizziness and headaches.  Endo/Heme/Allergies: Negative.     Vitals:   05/22/18 1529  BP: 110/61  Pulse: 70  Resp: 16  Temp: 98.3 F (36.8 C)  SpO2: 96%    Physical Exam Vitals signs reviewed.  Constitutional:      Appearance: Normal appearance.  HENT:     Head: Normocephalic and atraumatic.     Nose: Nose normal.     Mouth/Throat:     Mouth: Mucous membranes are moist.     Pharynx: Oropharynx is clear.  Eyes:     Extraocular Movements: Extraocular movements intact.     Conjunctiva/sclera: Conjunctivae normal.     Pupils: Pupils are equal, round, and reactive to light.  Neck:     Musculoskeletal: Normal range of motion and neck supple.  Cardiovascular:     Rate and Rhythm: Normal rate and regular rhythm.     Heart sounds: Normal heart sounds.  Pulmonary:     Effort: Pulmonary effort is normal.     Breath sounds: Normal breath sounds.  Abdominal:     General: There is no distension.     Tenderness: There is no abdominal tenderness.  Musculoskeletal: Normal range of motion.  Skin:    General: Skin is warm and dry.     Capillary Refill: Capillary refill takes less than 2 seconds.  Neurological:     General: No focal deficit present.     Mental Status: She is alert and oriented to person, place, and time.  Psychiatric:        Mood and Affect: Mood normal.        Behavior: Behavior normal.   A total of 25  minutes was spent in the room with the patient, greater than 50% of which was in counseling/coordination of care regarding differential diagnosis, treatment, medications, and need for follow-up if no better or worse.    ASSESSMENT & PLAN: Joselynn was seen today for cough.  Diagnoses and all orders for this visit:  Cough -     promethazine-dextromethorphan (PROMETHAZINE-DM) 6.25-15 MG/5ML syrup; Take 5 mLs by mouth 4 (four) times daily as needed for cough.  Lower respiratory infection -     azithromycin (ZITHROMAX) 250 MG tablet; Sig as indicated  Chest congestion -     pseudoephedrine-guaifenesin (MUCINEX D) 60-600 MG 12 hr tablet; Take 1 tablet by mouth every 12 (twelve) hours for 5 days.    Patient Instructions       If you have lab work done today you will be contacted with your lab results within the next 2 weeks.  If you have not heard from [place] then please contact us. The fastest way to get your results is to register for My Chart.   IF you received an x-ray today, you will receive an invoice from [facility]. Please contact [facility] at [phone] with questions or concerns regarding your invoice.   IF you received labwork today, you will receive an invoice from [facility]. Please contact [facility] at [phone] with questions or concerns regarding your invoice.   Our billing staff will not be able to assist you with questions regarding bills from these companies.  You will be contacted with the lab results as soon as they are available. The fastest way to get your results is to activate your My Chart account. Instructions are located on the last page of this paperwork. If you have not heard from [place] regarding the results in 2 weeks, please contact this office.     Cough, Adult  A cough helps to clear your throat and lungs. A cough may last only 2-3 weeks (acute), or it may last longer than 8 weeks (chronic). Many different things can cause a cough. A  cough may be a sign of an illness or another medical condition. Follow these instructions at home:  Pay attention to any changes in your cough.  Take medicines only as told by your doctor. ? If you were prescribed an antibiotic medicine, take it as told by your doctor. Do not stop taking it even if you start to feel better. ? Talk with your doctor before you try using a cough medicine.  Drink enough fluid to keep your pee (urine) clear or pale yellow.  If the air is dry, use a cold steam vaporizer or humidifier in your home.  Stay away from things that make you cough at work or at home.  If your cough is worse at night, try using extra pillows to raise your head up higher while you sleep.  Do not  smoke, and try not to be around smoke. If you need help quitting, ask your doctor.  Do not have caffeine.  Do not drink alcohol.  Rest as needed. Contact a doctor if:  You have new problems (symptoms).  You cough up yellow fluid (pus).  Your cough does not get better after 2-3 weeks, or your cough gets worse.  Medicine does not help your cough and you are not sleeping well.  You have pain that gets worse or pain that is not helped with medicine.  You have a fever.  You are losing weight and you do not know why.  You have night sweats. Get help right away if:  You cough up blood.  You have trouble breathing.  Your heartbeat is very fast. This information is not intended to replace advice given to you by your health care provider. Make sure you discuss any questions you have with your health care provider. Document Released: 12/29/2010 Document Revised: 09/23/2015 Document Reviewed: 06/24/2014 Elsevier Interactive Patient Education  2019 Elsevier Inc.      Agustina Caroli, MD [facility]

## 2018-05-22 NOTE — Patient Instructions (Addendum)
     If you have lab work done today you will be contacted with your lab results within the next 2 weeks.  If you have not heard from us then please contact us. The fastest way to get your results is to register for My Chart.   IF you received an x-ray today, you will receive an invoice from [facility]. Please contact [facility] at [phone] with questions or concerns regarding your invoice.   IF you received labwork today, you will receive an invoice from [facility]. Please contact [facility] at [phone] with questions or concerns regarding your invoice.   Our billing staff will not be able to assist you with questions regarding bills from these companies.  You will be contacted with the lab results as soon as they are available. The fastest way to get your results is to activate your My Chart account. Instructions are located on the last page of this paperwork. If you have not heard from us regarding the results in 2 weeks, please contact this office.     Cough, Adult  A cough helps to clear your throat and lungs. A cough may last only 2-3 weeks (acute), or it may last longer than 8 weeks (chronic). Many different things can cause a cough. A cough may be a sign of an illness or another medical condition. Follow these instructions at home:  Pay attention to any changes in your cough.  Take medicines only as told by your doctor. ? If you were prescribed an antibiotic medicine, take it as told by your doctor. Do not stop taking it even if you start to feel better. ? Talk with your doctor before you try using a cough medicine.  Drink enough fluid to keep your pee (urine) clear or pale yellow.  If the air is dry, use a cold steam vaporizer or humidifier in your home.  Stay away from things that make you cough at work or at home.  If your cough is worse at night, try using extra pillows to raise your head up higher while you sleep.  Do not smoke, and try not to  be around smoke. If you need help quitting, ask your doctor.  Do not have caffeine.  Do not drink alcohol.  Rest as needed. Contact a doctor if:  You have new problems (symptoms).  You cough up yellow fluid (pus).  Your cough does not get better after 2-3 weeks, or your cough gets worse.  Medicine does not help your cough and you are not sleeping well.  You have pain that gets worse or pain that is not helped with medicine.  You have a fever.  You are losing weight and you do not know why.  You have night sweats. Get help right away if:  You cough up blood.  You have trouble breathing.  Your heartbeat is very fast. This information is not intended to replace advice given to you by your health care provider. Make sure you discuss any questions you have with your health care provider. Document Released: 12/29/2010 Document Revised: 09/23/2015 Document Reviewed: 06/24/2014 Elsevier Interactive Patient Education  2019 Elsevier Inc.  

## 2018-05-27 ENCOUNTER — Ambulatory Visit: Payer: Self-pay

## 2018-05-30 ENCOUNTER — Other Ambulatory Visit: Payer: Self-pay

## 2018-05-30 ENCOUNTER — Encounter: Payer: Self-pay | Admitting: Family Medicine

## 2018-05-30 ENCOUNTER — Ambulatory Visit: Payer: Medicare HMO | Admitting: Family Medicine

## 2018-05-30 VITALS — BP 117/68 | HR 85 | Temp 98.4°F | Ht 62.0 in | Wt 150.8 lb

## 2018-05-30 DIAGNOSIS — J310 Chronic rhinitis: Secondary | ICD-10-CM

## 2018-05-30 DIAGNOSIS — J329 Chronic sinusitis, unspecified: Secondary | ICD-10-CM | POA: Diagnosis not present

## 2018-05-30 MED ORDER — MONTELUKAST SODIUM 10 MG PO TABS: 10.0000 mg | ORAL_TABLET | Freq: Every day | ORAL | 3 refills | Status: DC

## 2018-05-30 MED ORDER — AMOXICILLIN-POT CLAVULANATE 875-125 MG PO TABS: 1.0000 | ORAL_TABLET | Freq: Two times a day (BID) | ORAL | 0 refills | Status: DC

## 2018-05-30 NOTE — Progress Notes (Signed)
1/30/20203:59 PM  [name] 05/23/1960, 58 y.o. female [id number]  Chief Complaint  Patient presents with  . Cough    mostly at night, stopped up nose, has been here 3 times for the same issue. Had fever and chills this past Monday    HPI:   Patient is a 58 y.o. female who presents today for cough, rhinorrhea, sinus pressure, hoarseness, sneezing, itchy throat for one month  Treated with azithromycin on 05/22/2018 Was getting better but then returned once abx completed Doing nasal sprays daily Usually gets bronchitis every year 100.8 on Monday Now having chills Does not feel SOB No n/v/d     Fall Risk  05/30/2018 05/22/2018 05/22/2018 05/06/2018 08/13/2017  Falls in the past year? 0 1 0 0 No  Number falls in past yr: - 0 - - -  Injury with Fall? - 1 - - -  Comment - injury right shoulder - - -  Risk Factor Category  - - - - -  Comment - - - - -  Risk for fall due to : - - - - -  Follow up - - - - -     Depression screen [facility] 2/9 05/30/2018 05/22/2018 05/06/2018  Decreased Interest 0 0 0  Down, Depressed, Hopeless 0 0 0  PHQ - 2 Score 0 0 0  Altered sleeping - - -  Tired, decreased energy - - -  Change in appetite - - -  Feeling bad or failure about yourself  - - -  Trouble concentrating - - -  Moving slowly or fidgety/restless - - -  Suicidal thoughts - - -  PHQ-9 Score - - -  Difficult doing work/chores - - -  Some recent data might be hidden    Allergies  Allergen Reactions  . Coconut Flavor Swelling and Rash  . Ibuprofen Swelling and Rash  . Prednisone Swelling and Rash  . Cyclobenzaprine   . Morphine Nausea And Vomiting    Prior to Admission medications   Medication Sig Start Date End Date Taking? Authorizing Provider  acarbose (PRECOSE) 25 MG tablet TAKE 1 TABLET THREE TIMES DAILY WITH MEALS 02/23/18  Yes Renato Shin, MD  ACCU-CHEK FASTCLIX LANCETS MISC Use to check blood sugar 3 times daily. 08/15/16  Yes Renato Shin, MD  aspirin EC 81  MG tablet Take 81 mg by mouth 2 (two) times daily.   Yes [provider]  azithromycin (ZITHROMAX) 250 MG tablet Sig as indicated 05/22/18  Yes Sagardia, Ines Bloomer, MD  Bisacodyl (DULCOLAX PO) Take 1 tablet by mouth 2 (two) times daily.    Yes [provider]  Bromocriptine Mesylate 0.8 MG TABS Take 1 tablet (0.8 mg total) by mouth daily. 06/14/17  Yes Renato Shin, MD  carvedilol (COREG) 3.125 MG tablet TAKE 1 TABLET TWICE DAILY 02/23/18  Yes Shawnee Knapp, MD  diazepam (VALIUM) 5 MG tablet Take 1 tablet (5 mg total) by mouth every 12 (twelve) hours as needed for anxiety or muscle spasms. **NEED OFFICE VISIT FOR REFILL** 02/23/18  Yes Shawnee Knapp, MD  DULoxetine (CYMBALTA) 60 MG capsule Take 60 mg by mouth 2 (two) times daily.   Yes [provider]  empagliflozin (JARDIANCE) 10 MG TABS tablet Take 10 mg by mouth daily. 08/23/17  Yes Renato Shin, MD  famotidine (PEPCID) 20 MG tablet Take 1 tablet (20 mg total) by mouth 2 (two) times daily. 04/15/18  Yes Shawnee Knapp, MD  fenofibrate (TRICOR) 145 MG tablet  Take 1 tablet (145 mg total) by mouth daily. 01/18/18  Yes Nahser, Wonda Cheng, MD  fluticasone (FLONASE) 50 MCG/ACT nasal spray Place 2 sprays into both nostrils at bedtime. 03/24/17  Yes Shawnee Knapp, MD  gabapentin (NEURONTIN) 800 MG tablet Take 1 tablet (800 mg total) by mouth 4 (four) times daily. 10/12/17  Yes Shawnee Knapp, MD  glucose blood (ACCU-CHEK GUIDE) test strip Use to check blood sugar 3 times daily. 08/15/16  Yes Renato Shin, MD  ipratropium (ATROVENT) 0.06 % nasal spray Place 1-2 sprays into both nostrils 4 (four) times daily. 05/06/18  Yes Wendie Agreste, MD  Ipratropium-Albuterol (COMBIVENT RESPIMAT) 20-100 MCG/ACT AERS respimat Inhale 1 puff into the lungs every 6 (six) hours. 05/10/17  Yes Shawnee Knapp, MD  levothyroxine (SYNTHROID, LEVOTHROID) 100 MCG tablet Take 1 tablet (100 mcg total) by mouth daily before breakfast. 10/10/17  Yes Renato Shin, MD    metFORMIN (GLUCOPHAGE) 1000 MG tablet TAKE 1 TABLET BY MOUTH DAILY WITH BREAKFAST. 07/31/17  Yes Shawnee Knapp, MD  Methylnaltrexone Bromide (RELISTOR) 150 MG TABS Take 3 tablets by mouth daily. With water >30 min before first mea 07/31/17  Yes Shawnee Knapp, MD  Omega-3 Fatty Acids (FISH OIL) 1000 MG CAPS Take by mouth.   Yes [provider]  omeprazole (PRILOSEC) 40 MG capsule TAKE 1 CAPSULE DAILY 30 MINUTES BEFORE A MEAL 05/10/17  Yes Shawnee Knapp, MD  oxyCODONE-acetaminophen (PERCOCET) 10-325 MG tablet Take 1 tablet by mouth every 4 (four) hours as needed for pain.   Yes [provider]  promethazine-dextromethorphan (PROMETHAZINE-DM) 6.25-15 MG/5ML syrup Take 5 mLs by mouth 4 (four) times daily as needed for cough. 05/22/18  Yes Sagardia, Ines Bloomer, MD  rosuvastatin (CRESTOR) 20 MG tablet Take 1 tablet (20 mg total) by mouth daily. 12/07/17  Yes Shawnee Knapp, MD  tiZANidine (ZANAFLEX) 4 MG tablet Take 4 mg by mouth every 6 (six) hours as needed for muscle spasms.   Yes [provider]  traZODone (DESYREL) 100 MG tablet Take 1 tablet (100 mg total) by mouth at bedtime as needed for sleep. 11/24/16  Yes Shawnee Knapp, MD    Past Medical History:  Diagnosis Date  . Allergy   . Anxiety   . Arthritis   . Back pain, chronic   . Bipolar disorder ([place])   . Bronchitis    hx-no inhalers at home  . Carotid bruit   . Carpal tunnel syndrome on both sides   . Coronary artery disease   . DDD (degenerative disc disease), lumbar   . Depression   . Diabetes mellitus    diet controlled  . Ejection fraction    .  Marland Kitchen GERD (gastroesophageal reflux disease)   . Hx of CABG   . Hypercholesteremia   . Hypertension   . Hypothyroidism   . Multiple thyroid nodules   . Myocardial infarction ([place]) 11/20/06  . Neuromuscular disorder ([place])   . Nodule of neck     Past Surgical History:  Procedure Laterality Date  . ABDOMINAL HYSTERECTOMY  1984  . BREAST EXCISIONAL BIOPSY Right    benign   . BREAST EXCISIONAL BIOPSY Left    benign  . BREAST SURGERY     rt br cystx2  . CORONARY ARTERY BYPASS GRAFT  2008  . KNEE SURGERY  2009   lt   . ORIF ANKLE FRACTURE Right 07/12/2012   Procedure: OPEN REDUCTION INTERNAL FIXATION (ORIF) RIGHT ANKLE FRACTURE;  Surgeon:  Alta Corning, MD;  Location: [facility];  Service: Orthopedics;  Laterality: Right;  . SHOULDER SURGERY  11/08/2016  . TUBAL LIGATION    . WRIST SURGERY Left     Social History   Tobacco Use  . Smoking status: Current Every Day Smoker    Packs/day: 1.00    Years: 33.00    Pack years: 33.00    Types: Cigarettes  . Smokeless tobacco: Never Used  Substance Use Topics  . Alcohol use: No    Family History  Problem Relation Age of Onset  . Heart disease Mother   . Hyperlipidemia Mother   . Hypertension Mother   . Heart disease Father   . Stroke Father   . Hypertension Father   . Hyperlipidemia Father   . Hypertension Sister   . Hypertension Brother   . Cancer Daughter        unknown  . Hypertension Maternal Grandmother   . Hypertension Maternal Grandfather   . Cancer Maternal Grandfather   . Hypertension Paternal Grandmother   . Hypertension Paternal Grandfather   . Cancer Maternal Aunt        breast and ovarian  . Cancer Paternal Aunt        breast  . Colon cancer Neg Hx     ROS Per hpi  OBJECTIVE:  Blood pressure 117/68, pulse 85, temperature 98.4 F (36.9 C), temperature source Oral, height 5\' 2"  (1.575 m), weight 150 lb 12.8 oz (68.4 kg), SpO2 98 %. Body mass index is 27.58 kg/m.   Physical Exam Vitals signs and nursing note reviewed.  Constitutional:      Appearance: She is well-developed.  HENT:     Head: Normocephalic and atraumatic.     Right Ear: Hearing, tympanic membrane, ear canal and external ear normal.     Left Ear: Hearing, tympanic membrane, ear canal and external ear normal.     Nose:     Right Sinus: Maxillary sinus tenderness and frontal sinus  tenderness present.     Left Sinus: Maxillary sinus tenderness and frontal sinus tenderness present.  Eyes:     Conjunctiva/sclera: Conjunctivae normal.     Pupils: Pupils are equal, round, and reactive to light.  Neck:     Musculoskeletal: Neck supple.  Cardiovascular:     Rate and Rhythm: Normal rate and regular rhythm.     Heart sounds: Normal heart sounds. No murmur. No friction rub. No gallop.   Pulmonary:     Effort: Pulmonary effort is normal.     Breath sounds: Normal breath sounds. No wheezing or rales.  Lymphadenopathy:     Cervical: No cervical adenopathy.  Skin:    General: Skin is warm and dry.  Neurological:     Mental Status: She is alert and oriented to person, place, and time.    ASSESSMENT and PLAN  1. Rhinosinusitis Discussed supportive measures, new meds r/se/b and RTC precautions. Patient educational handout given. Other orders - amoxicillin-clavulanate (AUGMENTIN) 875-125 MG tablet; Take 1 tablet by mouth 2 (two) times daily. - montelukast (SINGULAIR) 10 MG tablet; Take 1 tablet (10 mg total) by mouth at bedtime.  Return if symptoms worsen or fail to improve.    Rutherford Guys, MD [facility] [place], [place] [address] Ph.  [phone] Fax [phone]

## 2018-05-30 NOTE — Patient Instructions (Addendum)
   If you have lab work done today you will be contacted with your lab results within the next 2 weeks.  If you have not heard from us then please contact us. The fastest way to get your results is to register for My Chart.   IF you received an x-ray today, you will receive an invoice from [facility]. Please contact [facility] at [phone] with questions or concerns regarding your invoice.   IF you received labwork today, you will receive an invoice from [facility]. Please contact [facility] at [phone] with questions or concerns regarding your invoice.   Our billing staff will not be able to assist you with questions regarding bills from these companies.  You will be contacted with the lab results as soon as they are available. The fastest way to get your results is to activate your My Chart account. Instructions are located on the last page of this paperwork. If you have not heard from us regarding the results in 2 weeks, please contact this office.     Sinusitis, Adult Sinusitis is soreness and swelling (inflammation) of your sinuses. Sinuses are hollow spaces in the bones around your face. They are located:  Around your eyes.  In the middle of your forehead.  Behind your nose.  In your cheekbones. Your sinuses and nasal passages are lined with a fluid called mucus. Mucus drains out of your sinuses. Swelling can trap mucus in your sinuses. This lets germs (bacteria, virus, or fungus) grow, which leads to infection. Most of the time, this condition is caused by a virus. What are the causes? This condition is caused by:  Allergies.  Asthma.  Germs.  Things that block your nose or sinuses.  Growths in the nose (nasal polyps).  Chemicals or irritants in the air.  Fungus (rare). What increases the risk? You are more likely to develop this condition if:  You have a weak body defense system (immune system).  You do a lot of swimming or  diving.  You use nasal sprays too much.  You smoke. What are the signs or symptoms? The main symptoms of this condition are pain and a feeling of pressure around the sinuses. Other symptoms include:  Stuffy nose (congestion).  Runny nose (drainage).  Swelling and warmth in the sinuses.  Headache.  Toothache.  A cough that may get worse at night.  Mucus that collects in the throat or the back of the nose (postnasal drip).  Being unable to smell and taste.  Being very tired (fatigue).  A fever.  Sore throat.  Bad breath. How is this diagnosed? This condition is diagnosed based on:  Your symptoms.  Your medical history.  A physical exam.  Tests to find out if your condition is short-term (acute) or long-term (chronic). Your doctor may: ? Check your nose for growths (polyps). ? Check your sinuses using a tool that has a light (endoscope). ? Check for allergies or germs. ? Do imaging tests, such as an MRI or CT scan. How is this treated? Treatment for this condition depends on the cause and whether it is short-term or long-term.  If caused by a virus, your symptoms should go away on their own within 10 days. You may be given medicines to relieve symptoms. They include: ? Medicines that shrink swollen tissue in the nose. ? Medicines that treat allergies (antihistamines). ? A spray that treats swelling of the nostrils. ? Rinses that help get rid of thick mucus in your   nose (nasal saline washes).  If caused by bacteria, your doctor may wait to see if you will get better without treatment. You may be given antibiotic medicine if you have: ? A very bad infection. ? A weak body defense system.  If caused by growths in the nose, you may need to have surgery. Follow these instructions at home: Medicines  Take, use, or apply over-the-counter and prescription medicines only as told by your doctor. These may include nasal sprays.  If you were prescribed an antibiotic  medicine, take it as told by your doctor. Do not stop taking the antibiotic even if you start to feel better. Hydrate and humidify   Drink enough water to keep your pee (urine) pale yellow.  Use a cool mist humidifier to keep the humidity level in your home above 50%.  Breathe in steam for 10-15 minutes, 3-4 times a day, or as told by your doctor. You can do this in the bathroom while a hot shower is running.  Try not to spend time in cool or dry air. Rest  Rest as much as you can.  Sleep with your head raised (elevated).  Make sure you get enough sleep each night. General instructions   Put a warm, moist washcloth on your face 3-4 times a day, or as often as told by your doctor. This will help with discomfort.  Wash your hands often with soap and water. If there is no soap and water, use hand sanitizer.  Do not smoke. Avoid being around people who are smoking (secondhand smoke).  Keep all follow-up visits as told by your doctor. This is important. Contact a doctor if:  You have a fever.  Your symptoms get worse.  Your symptoms do not get better within 10 days. Get help right away if:  You have a very bad headache.  You cannot stop throwing up (vomiting).  You have very bad pain or swelling around your face or eyes.  You have trouble seeing.  You feel confused.  Your neck is stiff.  You have trouble breathing. Summary  Sinusitis is swelling of your sinuses. Sinuses are hollow spaces in the bones around your face.  This condition is caused by tissues in your nose that become inflamed or swollen. This traps germs. These can lead to infection.  If you were prescribed an antibiotic medicine, take it as told by your doctor. Do not stop taking it even if you start to feel better.  Keep all follow-up visits as told by your doctor. This is important. This information is not intended to replace advice given to you by your health care provider. Make sure you discuss  any questions you have with your health care provider. Document Released: 10/04/2007 Document Revised: 09/17/2017 Document Reviewed: 09/17/2017 Elsevier Interactive Patient Education  2019 [employer]  

## 2018-06-02 DIAGNOSIS — G8929 Other chronic pain: Secondary | ICD-10-CM | POA: Diagnosis not present

## 2018-06-02 DIAGNOSIS — M545 Low back pain: Secondary | ICD-10-CM | POA: Diagnosis not present

## 2018-06-02 DIAGNOSIS — M25552 Pain in left hip: Secondary | ICD-10-CM | POA: Diagnosis not present

## 2018-06-03 ENCOUNTER — Other Ambulatory Visit: Payer: Self-pay | Admitting: Endocrinology

## 2018-06-05 DIAGNOSIS — M546 Pain in thoracic spine: Secondary | ICD-10-CM | POA: Diagnosis not present

## 2018-06-05 DIAGNOSIS — G8929 Other chronic pain: Secondary | ICD-10-CM | POA: Diagnosis not present

## 2018-06-05 DIAGNOSIS — M47817 Spondylosis without myelopathy or radiculopathy, lumbosacral region: Secondary | ICD-10-CM | POA: Diagnosis not present

## 2018-06-05 DIAGNOSIS — M545 Low back pain: Secondary | ICD-10-CM | POA: Diagnosis not present

## 2018-06-06 DIAGNOSIS — M67911 Unspecified disorder of synovium and tendon, right shoulder: Secondary | ICD-10-CM | POA: Diagnosis not present

## 2018-06-06 DIAGNOSIS — M25531 Pain in right wrist: Secondary | ICD-10-CM | POA: Diagnosis not present

## 2018-06-07 ENCOUNTER — Other Ambulatory Visit: Payer: Self-pay | Admitting: Family Medicine

## 2018-06-10 ENCOUNTER — Ambulatory Visit (INDEPENDENT_AMBULATORY_CARE_PROVIDER_SITE_OTHER): Payer: Medicare HMO | Admitting: Family Medicine

## 2018-06-10 ENCOUNTER — Encounter: Payer: Self-pay | Admitting: Family Medicine

## 2018-06-10 VITALS — BP 108/70 | Ht 63.0 in | Wt 153.2 lb

## 2018-06-10 DIAGNOSIS — Z0000 Encounter for general adult medical examination without abnormal findings: Secondary | ICD-10-CM

## 2018-06-10 NOTE — Progress Notes (Addendum)
Presents today for [employer] Annual Wellness Visit  Patient Care Team: Shawnee Knapp, MD as PCP - General (Family Medicine) Nahser, Wonda Cheng, MD as PCP - Cardiology (Cardiology) Phylliss Bob, MD as Consulting Physician (Orthopedic Surgery) Renato Shin, MD as Consulting Physician (Endocrinology) Nahser, Wonda Cheng, MD as Consulting Physician (Cardiology) [facility], [facility] as Consulting Physician     Immunization status:  Immunization History  Administered Date(s) Administered  . Influenza, Seasonal, Injecte, Preservative Fre 06/21/2012  . Influenza,inj,Quad PF,6+ Mos 01/31/2013, 01/13/2014, 02/01/2016, 02/11/2018  . Influenza-Unspecified 07/07/2015, 03/05/2017  . Pneumococcal Conjugate-13 02/02/2016  . Pneumococcal Polysaccharide-23 03/13/2014  . Tdap 08/19/2011     There are no preventive care reminders to display for this patient.   Functional Status Survey: Is the patient deaf or have difficulty hearing?: No Does the patient have difficulty seeing, even when wearing glasses/contacts?: No Does the patient have difficulty concentrating, remembering, or making decisions?: No Does the patient have difficulty walking or climbing stairs?: Yes Does the patient have difficulty dressing or bathing?: No Does the patient have difficulty doing errands alone such as visiting a doctor's office or shopping?: No   6CIT Screen 06/10/2018 05/24/2017  What Year? 0 points 0 points  What month? 0 points 0 points  What time? 0 points 0 points  Count back from 20 0 points 0 points  Months in reverse 0 points 0 points  Repeat phrase 0 points 0 points  Total Score 0 0        Clinical Support from 06/10/2018 in Primary Care at [facility]  AUDIT-C Score  0         Patient Active Problem List   Diagnosis Date Noted  . Gastroesophageal reflux disease 05/13/2017  . Polycythemia, secondary 05/13/2017  . Radiculopathy 10/21/2015  . Diabetes ([place])  07/29/2015  . 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 Multinodular goiter 09/15/2013  . Carotid bruit   . Depression   . Coronary artery disease   . Hypertension   . Hyperlipidemia   . Neuromuscular disorder ([place])   . Arthritis   . DDD (degenerative disc disease), lumbar   . Ejection fraction   . Hx of CABG   . Encounter for long-term (current) use of other medications 02/08/2012  . Spinal stenosis of lumbar region 02/08/2012  . Neuropathy 02/08/2012  . Superficial phlebitis and thrombophlebitis of the leg 04/09/2011  . Back pain, chronic   . TOBACCO ABUSE 11/20/2008     Past Medical History:  Diagnosis Date  . Allergy   . Anxiety   . Arthritis   . Back pain, chronic   . Bipolar disorder ([place])   . Bronchitis    hx-no inhalers at home  . Carotid bruit   . Carpal tunnel syndrome on both sides   . Coronary artery disease   . DDD (degenerative disc disease), lumbar   . Depression   . Diabetes mellitus    diet controlled  . Ejection fraction    .  Marland Kitchen GERD (gastroesophageal reflux disease)   . Hx of CABG   . Hypercholesteremia   . Hypertension   . Hypothyroidism   . Multiple thyroid nodules   . Myocardial infarction ([place]) 11/20/06  . Neuromuscular disorder ([place])   . Nodule of neck      Past Surgical History:  Procedure Laterality Date  . ABDOMINAL HYSTERECTOMY  1984  . BREAST EXCISIONAL BIOPSY Right    benign  . BREAST EXCISIONAL BIOPSY Left  benign  . BREAST SURGERY     rt br cystx2  . CORONARY ARTERY BYPASS GRAFT  2008  . KNEE SURGERY  2009   lt   . ORIF ANKLE FRACTURE Right 07/12/2012   Procedure: OPEN REDUCTION INTERNAL FIXATION (ORIF) RIGHT ANKLE FRACTURE;  Surgeon: Alta Corning, MD;  Location: [facility];  Service: Orthopedics;  Laterality: Right;  . SHOULDER SURGERY  11/08/2016  . TUBAL LIGATION    . WRIST SURGERY Left      Family History  Problem Relation Age of Onset  . Heart disease Mother   .  Hyperlipidemia Mother   . Hypertension Mother   . Heart disease Father   . Stroke Father   . Hypertension Father   . Hyperlipidemia Father   . Hypertension Sister   . Hypertension Brother   . Cancer Daughter        unknown  . Hypertension Maternal Grandmother   . Hypertension Maternal Grandfather   . Cancer Maternal Grandfather   . Hypertension Paternal Grandmother   . Hypertension Paternal Grandfather   . Cancer Maternal Aunt        breast and ovarian  . Cancer Paternal Aunt        breast  . Colon cancer Neg Hx      Social History   Socioeconomic History  . Marital status: Married    Spouse name: Not on file  . Number of children: 2  . Years of education: Not on file  . Highest education level: 11th grade  Occupational History  . Not on file  Social Needs  . Financial resource strain: Not hard at all  . Food insecurity:    Worry: Never true    Inability: Never true  . Transportation needs:    Medical: No    Non-medical: No  Tobacco Use  . Smoking status: Current Every Day Smoker    Packs/day: 1.00    Years: 33.00    Pack years: 33.00    Types: Cigarettes  . Smokeless tobacco: Never Used  Substance and Sexual Activity  . Alcohol use: No  . Drug use: No  . Sexual activity: Not on file  Lifestyle  . Physical activity:    Days per week: 0 days    Minutes per session: 0 min  . Stress: To some extent  Relationships  . Social connections:    Talks on phone: Once a week    Gets together: Once a week    Attends religious service: Never    Active member of club or organization: No    Attends meetings of clubs or organizations: Never    Relationship status: Married  . Intimate partner violence:    Fear of current or ex partner: No    Emotionally abused: No    Physically abused: No    Forced sexual activity: No  Other Topics Concern  . Not on file  Social History Narrative  . Not on file     Allergies  Allergen Reactions  . Latuda [Lurasidone Hcl]  Shortness Of Breath  . Coconut Flavor Swelling and Rash  . Ibuprofen Swelling and Rash  . Prednisone Swelling and Rash  . Cyclobenzaprine   . Meloxicam Itching  . Morphine Nausea And Vomiting     Prior to Admission medications   Medication Sig Start Date End Date Taking? Authorizing Provider  acarbose (PRECOSE) 25 MG tablet TAKE 1 TABLET THREE TIMES DAILY WITH MEALS 02/23/18  Yes Renato Shin, MD  ACCU-CHEK FASTCLIX LANCETS MISC Use to check blood sugar 3 times daily. 08/15/16  Yes Renato Shin, MD  amoxicillin-clavulanate (AUGMENTIN) 875-125 MG tablet Take 1 tablet by mouth 2 (two) times daily. 05/30/18  Yes Rutherford Guys, MD  aspirin EC 81 MG tablet Take 81 mg by mouth 2 (two) times daily.   Yes [provider]  Bisacodyl (DULCOLAX PO) Take 1 tablet by mouth 2 (two) times daily.    Yes [provider]  carvedilol (COREG) 3.125 MG tablet TAKE 1 TABLET TWICE DAILY 02/23/18  Yes Shawnee Knapp, MD  CYCLOSET 0.8 MG TABS TAKE 1 TABLET EVERY DAY 06/03/18  Yes Renato Shin, MD  diazepam (VALIUM) 5 MG tablet Take 1 tablet (5 mg total) by mouth every 12 (twelve) hours as needed for anxiety or muscle spasms. **NEED OFFICE VISIT FOR REFILL** 02/23/18  Yes Shawnee Knapp, MD  DULoxetine (CYMBALTA) 60 MG capsule Take 60 mg by mouth 2 (two) times daily.   Yes [provider]  empagliflozin (JARDIANCE) 10 MG TABS tablet Take 10 mg by mouth daily. 08/23/17  Yes Renato Shin, MD  famotidine (PEPCID) 20 MG tablet Take 1 tablet (20 mg total) by mouth 2 (two) times daily. 04/15/18  Yes Shawnee Knapp, MD  fenofibrate (TRICOR) 145 MG tablet Take 1 tablet (145 mg total) by mouth daily. 01/18/18  Yes Nahser, Wonda Cheng, MD  fluticasone (FLONASE) 50 MCG/ACT nasal spray Place 2 sprays into both nostrils at bedtime. 03/24/17  Yes Shawnee Knapp, MD  gabapentin (NEURONTIN) 800 MG tablet TAKE 1 TABLET FOUR TIMES DAILY 06/07/18  Yes Shawnee Knapp, MD  glucose blood (ACCU-CHEK GUIDE) test strip Use to check  blood sugar 3 times daily. 08/15/16  Yes Renato Shin, MD  ipratropium (ATROVENT) 0.06 % nasal spray Place 1-2 sprays into both nostrils 4 (four) times daily. 05/06/18  Yes Wendie Agreste, MD  Ipratropium-Albuterol (COMBIVENT RESPIMAT) 20-100 MCG/ACT AERS respimat Inhale 1 puff into the lungs every 6 (six) hours. 05/10/17  Yes Shawnee Knapp, MD  levothyroxine (SYNTHROID, LEVOTHROID) 100 MCG tablet Take 1 tablet (100 mcg total) by mouth daily before breakfast. 10/10/17  Yes Renato Shin, MD  metFORMIN (GLUCOPHAGE) 1000 MG tablet TAKE 1 TABLET BY MOUTH DAILY WITH BREAKFAST. 07/31/17  Yes Shawnee Knapp, MD  Methylnaltrexone Bromide (RELISTOR) 150 MG TABS Take 3 tablets by mouth daily. With water >30 min before first mea 07/31/17  Yes Shawnee Knapp, MD  montelukast (SINGULAIR) 10 MG tablet Take 1 tablet (10 mg total) by mouth at bedtime. 05/30/18  Yes Rutherford Guys, MD  Omega-3 Fatty Acids (FISH OIL) 1000 MG CAPS Take by mouth.   Yes [provider]  omeprazole (PRILOSEC) 40 MG capsule TAKE 1 CAPSULE DAILY 30 MINUTES BEFORE A MEAL 05/10/17  Yes Shawnee Knapp, MD  oxyCODONE-acetaminophen (PERCOCET) 10-325 MG tablet Take 1 tablet by mouth every 4 (four) hours as needed for pain.   Yes [provider]  promethazine-dextromethorphan (PROMETHAZINE-DM) 6.25-15 MG/5ML syrup Take 5 mLs by mouth 4 (four) times daily as needed for cough. 05/22/18  Yes Horald Pollen, MD  rosuvastatin (CRESTOR) 20 MG tablet TAKE 1 TABLET EVERY DAY 06/07/18  Yes Shawnee Knapp, MD  tiZANidine (ZANAFLEX) 4 MG tablet Take 4 mg by mouth every 6 (six) hours as needed for muscle spasms.   Yes [provider]  traZODone (DESYREL) 100 MG tablet Take 1 tablet (100 mg total) by mouth at bedtime as needed for  sleep. 11/24/16  Yes Shawnee Knapp, MD     Depression screen [facility] 2/9 06/10/2018 05/30/2018 05/22/2018 05/06/2018 08/13/2017  Decreased Interest 0 0 0 0 0  Down, Depressed, Hopeless 0 0 0 0 0  PHQ - 2 Score 0 0 0 0 0  Altered  sleeping - - - - -  Tired, decreased energy - - - - -  Change in appetite - - - - -  Feeling bad or failure about yourself  - - - - -  Trouble concentrating - - - - -  Moving slowly or fidgety/restless - - - - -  Suicidal thoughts - - - - -  PHQ-9 Score - - - - -  Difficult doing work/chores - - - - -  Some recent data might be hidden     Fall Risk  06/10/2018 05/30/2018 05/22/2018 05/22/2018 05/06/2018  Falls in the past year? 1 0 1 0 0  Number falls in past yr: 1 - 0 - -  Injury with Fall? 1 - 1 - -  Comment - - injury right shoulder - -  Risk Factor Category  - - - - -  Comment - - - - -  Risk for fall due to : History of fall(s);Impaired balance/gait - - - -  Follow up Falls evaluation completed;Falls prevention discussed;Education provided - - - -      PHYSICAL EXAM: BP 108/70   Ht 5\' 3"  (1.6 m)   Wt 153 lb 3.2 oz (69.5 kg)   BMI 27.14 kg/m    Wt Readings from Last 3 Encounters:  06/10/18 153 lb 3.2 oz (69.5 kg)  05/30/18 150 lb 12.8 oz (68.4 kg)  05/22/18 150 lb 9.6 oz (68.3 kg)     No exam data present    Physical Exam   Education/Counseling provided regarding diet and exercise, prevention of chronic diseases, smoking/tobacco cessation, if applicable, and reviewed "Covered Medicare Preventive Services."   ASSESSMENT/PLAN:   Cardiac Risk Factors include: diabetes mellitus;hypertension;dyslipidemia;smoking/ tobacco exposure   Pain : 0-10 Pain Score: 8 Pain Type: Chronic pain Pain Location: Back Pain Descriptors / Indicators: pain Pain Onset: over a year Pain Frequency: Every Day  Nutritional Status: BMI 25 -29 Overweight Nutritional Risks: None Diabetes: Yes CBG done?: No Did pt. bring in CBG monitor from home?: No How often do you need to have someone help you when you read instructions, pamphlets, or other written materials from your doctor or pharmacy?: 1 - Never What is the last grade level you completed in school?: 11th gradeIs the    patient's home free of loose throw rugs in walkways, pet beds, electrical cords, etc?   yes      Grab bars in the bathroom? yes      Handrails on the stairs?   yes      Adequate lighting?   yes  Ophthalmology uses [facility] on [address] due for another exam in July 2020 cataract in Right Eye.  Educated on [employer]     I have reviewed and agree with the above AWV documentation. Irma. Reubin Milan, MD

## 2018-06-10 NOTE — Patient Instructions (Signed)
Thank you for taking time to come for your Medicare Wellness Visit. I appreciate your ongoing commitment to your health goals. Please review the following plan we discussed and let me know if I can assist you in the future.  Waddell Iten LPN   Diabetes Mellitus and Nutrition, Adult When you have diabetes (diabetes mellitus), it is very important to have healthy eating habits because your blood sugar (glucose) levels are greatly affected by what you eat and drink. Eating healthy foods in the appropriate amounts, at about the same times every day, can help you:  Control your blood glucose.  Lower your risk of heart disease.  Improve your blood pressure.  Reach or maintain a healthy weight. Every person with diabetes is different, and each person has different needs for a meal plan. Your health care provider may recommend that you work with a diet and nutrition specialist (dietitian) to make a meal plan that is best for you. Your meal plan may vary depending on factors such as:  The calories you need.  The medicines you take.  Your weight.  Your blood glucose, blood pressure, and cholesterol levels.  Your activity level.  Other health conditions you have, such as heart or kidney disease. How do carbohydrates affect me? Carbohydrates, also called carbs, affect your blood glucose level more than any other type of food. Eating carbs naturally raises the amount of glucose in your blood. Carb counting is a method for keeping track of how many carbs you eat. Counting carbs is important to keep your blood glucose at a healthy level, especially if you use insulin or take certain oral diabetes medicines. It is important to know how many carbs you can safely have in each meal. This is different for every person. Your dietitian can help you calculate how many carbs you should have at each meal and for each snack. Foods that contain carbs include:  Bread, cereal, rice, pasta, and crackers.   Potatoes and corn.  Peas, beans, and lentils.  Milk and yogurt.  Fruit and juice.  Desserts, such as cakes, cookies, ice cream, and candy. How does alcohol affect me? Alcohol can cause a sudden decrease in blood glucose (hypoglycemia), especially if you use insulin or take certain oral diabetes medicines. Hypoglycemia can be a life-threatening condition. Symptoms of hypoglycemia (sleepiness, dizziness, and confusion) are similar to symptoms of having too much alcohol. If your health care provider says that alcohol is safe for you, follow these guidelines:  Limit alcohol intake to no more than 1 drink per day for nonpregnant women and 2 drinks per day for men. One drink equals 12 oz of beer, 5 oz of wine, or 1 oz of hard liquor.  Do not drink on an empty stomach.  Keep yourself hydrated with water, diet soda, or unsweetened iced tea.  Keep in mind that regular soda, juice, and other mixers may contain a lot of sugar and must be counted as carbs. What are tips for following this plan?  Reading food labels  Start by checking the serving size on the "Nutrition Facts" label of packaged foods and drinks. The amount of calories, carbs, fats, and other nutrients listed on the label is based on one serving of the item. Many items contain more than one serving per package.  Check the total grams (g) of carbs in one serving. You can calculate the number of servings of carbs in one serving by dividing the total carbs by 15. For example, if a food   has 30 g of total carbs, it would be equal to 2 servings of carbs.  Check the number of grams (g) of saturated and trans fats in one serving. Choose foods that have low or no amount of these fats.  Check the number of milligrams (mg) of salt (sodium) in one serving. Most people should limit total sodium intake to less than 2,300 mg per day.  Always check the nutrition information of foods labeled as "low-fat" or "nonfat". These foods may be higher in  added sugar or refined carbs and should be avoided.  Talk to your dietitian to identify your daily goals for nutrients listed on the label. Shopping  Avoid buying canned, premade, or processed foods. These foods tend to be high in fat, sodium, and added sugar.  Shop around the outside edge of the grocery store. This includes fresh fruits and vegetables, bulk grains, fresh meats, and fresh dairy. Cooking  Use low-heat cooking methods, such as baking, instead of high-heat cooking methods like deep frying.  Cook using healthy oils, such as olive, canola, or sunflower oil.  Avoid cooking with butter, cream, or high-fat meats. Meal planning  Eat meals and snacks regularly, preferably at the same times every day. Avoid going long periods of time without eating.  Eat foods high in fiber, such as fresh fruits, vegetables, beans, and whole grains. Talk to your dietitian about how many servings of carbs you can eat at each meal.  Eat 4-6 ounces (oz) of lean protein each day, such as lean meat, chicken, fish, eggs, or tofu. One oz of lean protein is equal to: ? 1 oz of meat, chicken, or fish. ? 1 egg. ?  cup of tofu.  Eat some foods each day that contain healthy fats, such as avocado, nuts, seeds, and fish. Lifestyle  Check your blood glucose regularly.  Exercise regularly as told by your health care provider. This may include: ? 150 minutes of moderate-intensity or vigorous-intensity exercise each week. This could be brisk walking, biking, or water aerobics. ? Stretching and doing strength exercises, such as yoga or weightlifting, at least 2 times a week.  Take medicines as told by your health care provider.  Do not use any products that contain nicotine or tobacco, such as cigarettes and e-cigarettes. If you need help quitting, ask your health care provider.  Work with a counselor or diabetes educator to identify strategies to manage stress and any emotional and social challenges.  Questions to ask a health care provider  Do I need to meet with a diabetes educator?  Do I need to meet with a dietitian?  What number can I call if I have questions?  When are the best times to check my blood glucose? Where to find more information:  American Diabetes Association: diabetes.org  Academy of Nutrition and Dietetics: www.eatright.org  National Institute of Diabetes and Digestive and Kidney Diseases (NIH): www.niddk.nih.gov Summary  A healthy meal plan will help you control your blood glucose and maintain a healthy lifestyle.  Working with a diet and nutrition specialist (dietitian) can help you make a meal plan that is best for you.  Keep in mind that carbohydrates (carbs) and alcohol have immediate effects on your blood glucose levels. It is important to count carbs and to use alcohol carefully. This information is not intended to replace advice given to you by your health care provider. Make sure you discuss any questions you have with your health care provider. Document Released: 01/12/2005 Document Revised: 11/15/2016   Document Reviewed: 05/22/2016 Elsevier Interactive Patient Education  2019 Elsevier Inc[employer] Advance Directive  Advance directives are legal documents that let you make choices ahead of time about your health care and medical treatment in case you become unable to communicate for yourself. Advance directives are a way for you to communicate your wishes to family, friends, and health care providers. This can help convey your decisions about end-of-life care if you become unable to communicate. Discussing and writing advance directives should happen over time rather than all at once. Advance directives can be changed depending on your situation and what you want, even after you have signed the advance directives. If you do not have an advance directive, some states assign family decision makers to act on your behalf based on how closely you are related to  them. Each state has its own laws regarding advance directives. You may want to check with your health care provider, attorney, or state representative about the laws in your state. There are different types of advance directives, such as:  Medical power of attorney.  Living will.  Do not resuscitate (DNR) or do not attempt resuscitation (DNAR) order. Health care proxy and medical power of attorney A health care proxy, also called a health care agent, is a person who is appointed to make medical decisions for you in cases in which you are unable to make the decisions yourself. Generally, people choose someone they know well and trust to represent their preferences. Make sure to ask this person for an agreement to act as your proxy. A proxy may have to exercise judgment in the event of a medical decision for which your wishes are not known. A medical power of attorney is a legal document that names your health care proxy. Depending on the laws in your state, after the document is written, it may also need to be:  Signed.  Notarized.  Dated.  Copied.  Witnessed.  Incorporated into your medical record. You may also want to appoint someone to manage your financial affairs in a situation in which you are unable to do so. This is called a durable power of attorney for finances. It is a separate legal document from the durable power of attorney for health care. You may choose the same person or someone different from your health care proxy to act as your agent in financial matters. If you do not appoint a proxy, or if there is a concern that the proxy is not acting in your best interests, a court-appointed guardian may be designated to act on your behalf. Living will A living will is a set of instructions documenting your wishes about medical care when you cannot express them yourself. Health care providers should keep a copy of your living will in your medical record. You may want to give a copy  to family members or friends. To alert caregivers in case of an emergency, you can place a card in your wallet to let them know that you have a living will and where they can find it. A living will is used if you become:  Terminally ill.  Incapacitated.  Unable to communicate or make decisions. Items to consider in your living will include:  The use or non-use of life-sustaining equipment, such as dialysis machines and breathing machines (ventilators).  A DNR or DNAR order, which is the instruction not to use cardiopulmonary resuscitation (CPR) if breathing or heartbeat stops.  The use or non-use of tube feeding.  Withholding of food   and fluids.  Comfort (palliative) care when the goal becomes comfort rather than a cure.  Organ and tissue donation. A living will does not give instructions for distributing your money and property if you should pass away. It is recommended that you seek the advice of a lawyer when writing a will. Decisions about taxes, beneficiaries, and asset distribution will be legally binding. This process can relieve your family and friends of any concerns surrounding disputes or questions that may come up about the distribution of your assets. DNR or DNAR A DNR or DNAR order is a request not to have CPR in the event that your heart stops beating or you stop breathing. If a DNR or DNAR order has not been made and shared, a health care provider will try to help any patient whose heart has stopped or who has stopped breathing. If you plan to have surgery, talk with your health care provider about how your DNR or DNAR order will be followed if problems occur. Summary  Advance directives are the legal documents that allow you to make choices ahead of time about your health care and medical treatment in case you become unable to communicate for yourself.  The process of discussing and writing advance directives should happen over time. You can change the advance directives,  even after you have signed them.  Advance directives include DNR or DNAR orders, living wills, and designating an agent as your medical power of attorney. This information is not intended to replace advice given to you by your health care provider. Make sure you discuss any questions you have with your health care provider. Document Released: 07/25/2007 Document Revised: 03/06/2016 Document Reviewed: 03/06/2016 Elsevier Interactive Patient Education  2019 Elsevier Inc.  

## 2018-06-24 ENCOUNTER — Other Ambulatory Visit: Payer: Self-pay

## 2018-06-24 ENCOUNTER — Encounter: Payer: Self-pay | Admitting: Family Medicine

## 2018-06-24 ENCOUNTER — Ambulatory Visit (INDEPENDENT_AMBULATORY_CARE_PROVIDER_SITE_OTHER): Payer: Medicare HMO | Admitting: Family Medicine

## 2018-06-24 VITALS — BP 126/72 | HR 64 | Temp 98.6°F | Ht 63.0 in | Wt 151.0 lb

## 2018-06-24 DIAGNOSIS — Z860100 Personal history of colon polyps, unspecified: Secondary | ICD-10-CM | POA: Insufficient documentation

## 2018-06-24 DIAGNOSIS — E119 Type 2 diabetes mellitus without complications: Secondary | ICD-10-CM | POA: Diagnosis not present

## 2018-06-24 DIAGNOSIS — Z90710 Acquired absence of both cervix and uterus: Secondary | ICD-10-CM | POA: Insufficient documentation

## 2018-06-24 DIAGNOSIS — E7800 Pure hypercholesterolemia, unspecified: Secondary | ICD-10-CM

## 2018-06-24 DIAGNOSIS — Z01419 Encounter for gynecological examination (general) (routine) without abnormal findings: Secondary | ICD-10-CM

## 2018-06-24 DIAGNOSIS — Z01411 Encounter for gynecological examination (general) (routine) with abnormal findings: Secondary | ICD-10-CM | POA: Diagnosis not present

## 2018-06-24 DIAGNOSIS — E890 Postprocedural hypothyroidism: Secondary | ICD-10-CM

## 2018-06-24 DIAGNOSIS — I10 Essential (primary) hypertension: Secondary | ICD-10-CM | POA: Diagnosis not present

## 2018-06-24 DIAGNOSIS — Z1211 Encounter for screening for malignant neoplasm of colon: Secondary | ICD-10-CM | POA: Diagnosis not present

## 2018-06-24 DIAGNOSIS — D751 Secondary polycythemia: Secondary | ICD-10-CM | POA: Diagnosis not present

## 2018-06-24 MED ORDER — MONTELUKAST SODIUM 10 MG PO TABS: 10.0000 mg | ORAL_TABLET | Freq: Every day | ORAL | 5 refills | Status: DC

## 2018-06-24 MED ORDER — ZOSTER VAC RECOMB ADJUVANTED 50 MCG/0.5ML IM SUSR: 0.5000 mL | Freq: Once | INTRAMUSCULAR | 1 refills | Status: AC

## 2018-06-24 MED ORDER — FLUTICASONE PROPIONATE 50 MCG/ACT NA SUSP: 1.0000 | Freq: Two times a day (BID) | NASAL | 5 refills | Status: DC

## 2018-06-24 NOTE — Progress Notes (Signed)
2/24/202011:15 AM  [name] 04-27-1961, 58 y.o. female [id number]  Chief Complaint  Patient presents with  . Annual Exam    wants to discuss the allergy medication that sheis taking, Still having allergy flares. Take meds at night  . Immunizations    needs rx for shingle shot    HPI:   Patient is a 58 y.o. female with past medical history significant for DM2, hypothyroidism, HTN, HLP, CAD, DDD, tobacco use who presents today for WWE  Cervical Cancer Screening: neg pap and hpv in 2015, had hysterectomy, has ovaries not sure cervix Breast Cancer Screening: may 2019 Colorectal Cancer Screening: may 2015, + polyps, repeat 5 years per dr Kerin Perna Density Testing: at age 9 Seasonal Influenza Vaccination: has had this season Td/Tdap Vaccination: UTD Pneumococcal Vaccination: UTD Zoster Vaccination: needs prescription Frequency of Dental evaluation: has not seen in a while Frequency of Eye evaluation: wears glasses, sees them every summer, goes to [employer]   Patient Care Team: Shawnee Knapp, MD as PCP - General (Family Medicine) Nahser, Wonda Cheng, MD as PCP - Cardiology (Cardiology) Phylliss Bob, MD as Consulting Physician (Orthopedic Surgery) Renato Shin, MD as Consulting Physician (Endocrinology) Nahser, Wonda Cheng, MD as Consulting Physician (Cardiology) [facility] [place] [facility] as Consulting Physician  Fall Risk  06/24/2018 06/10/2018 05/30/2018 05/22/2018 05/22/2018  Falls in the past year? 1 1 0 1 0  Comment currently being tx with [facility] for injury to right shoulder and right wrist.  - - - -  Number falls in past yr: 0 1 - 0 -  Injury with Fall? 1 1 - 1 -  Comment - - - injury right shoulder -  Risk Factor Category  - - - - -  Comment - - - - -  Risk for fall due to : - History of fall(s);Impaired balance/gait - - -  Follow up - Falls evaluation completed;Falls prevention discussed;Education provided - - -      Depression screen [facility] 2/9 06/10/2018 05/30/2018 05/22/2018  Decreased Interest 0 0 0  Down, Depressed, Hopeless 0 0 0  PHQ - 2 Score 0 0 0  Altered sleeping - - -  Tired, decreased energy - - -  Change in appetite - - -  Feeling bad or failure about yourself  - - -  Trouble concentrating - - -  Moving slowly or fidgety/restless - - -  Suicidal thoughts - - -  PHQ-9 Score - - -  Difficult doing work/chores - - -  Some recent data might be hidden    Allergies  Allergen Reactions  . Latuda [Lurasidone Hcl] Shortness Of Breath  . Coconut Flavor Swelling and Rash  . Ibuprofen Swelling and Rash  . Prednisone Swelling and Rash  . Cyclobenzaprine   . Meloxicam Itching  . Morphine Nausea And Vomiting    Prior to Admission medications   Medication Sig Start Date End Date Taking? Authorizing Provider  acarbose (PRECOSE) 25 MG tablet TAKE 1 TABLET THREE TIMES DAILY WITH MEALS 02/23/18   Renato Shin, MD  ACCU-CHEK FASTCLIX LANCETS MISC Use to check blood sugar 3 times daily. 08/15/16   Renato Shin, MD  aspirin EC 81 MG tablet Take 81 mg by mouth 2 (two) times daily.    [provider]  Bisacodyl (DULCOLAX PO) Take 1 tablet by mouth 2 (two) times daily.     [provider]  carvedilol (COREG) 3.125 MG tablet TAKE 1 TABLET TWICE DAILY 02/23/18  Shawnee Knapp, MD  CYCLOSET 0.8 MG TABS TAKE 1 TABLET EVERY DAY 06/03/18   Renato Shin, MD  diazepam (VALIUM) 5 MG tablet Take 1 tablet (5 mg total) by mouth every 12 (twelve) hours as needed for anxiety or muscle spasms. **NEED OFFICE VISIT FOR REFILL** 02/23/18   Shawnee Knapp, MD  DULoxetine (CYMBALTA) 60 MG capsule Take 60 mg by mouth 2 (two) times daily.    [provider]  empagliflozin (JARDIANCE) 10 MG TABS tablet Take 10 mg by mouth daily. 08/23/17   Renato Shin, MD  famotidine (PEPCID) 20 MG tablet Take 1 tablet (20 mg total) by mouth 2 (two) times daily. 04/15/18   Shawnee Knapp, MD  fenofibrate (TRICOR) 145 MG  tablet Take 1 tablet (145 mg total) by mouth daily. 01/18/18   Nahser, Wonda Cheng, MD  fluticasone (FLONASE) 50 MCG/ACT nasal spray Place 2 sprays into both nostrils at bedtime. 03/24/17   Shawnee Knapp, MD  gabapentin (NEURONTIN) 800 MG tablet TAKE 1 TABLET FOUR TIMES DAILY 06/07/18   Shawnee Knapp, MD  glucose blood (ACCU-CHEK GUIDE) test strip Use to check blood sugar 3 times daily. 08/15/16   Renato Shin, MD  Ipratropium-Albuterol (COMBIVENT RESPIMAT) 20-100 MCG/ACT AERS respimat Inhale 1 puff into the lungs every 6 (six) hours. 05/10/17   Shawnee Knapp, MD  levothyroxine (SYNTHROID, LEVOTHROID) 100 MCG tablet Take 1 tablet (100 mcg total) by mouth daily before breakfast. 10/10/17   Renato Shin, MD  metFORMIN (GLUCOPHAGE) 1000 MG tablet TAKE 1 TABLET BY MOUTH DAILY WITH BREAKFAST. 07/31/17   Shawnee Knapp, MD  Methylnaltrexone Bromide (RELISTOR) 150 MG TABS Take 3 tablets by mouth daily. With water >30 min before first mea 07/31/17   Shawnee Knapp, MD  montelukast (SINGULAIR) 10 MG tablet Take 1 tablet (10 mg total) by mouth at bedtime. 05/30/18   Rutherford Guys, MD  Omega-3 Fatty Acids (FISH OIL) 1000 MG CAPS Take by mouth.    [provider]  omeprazole (PRILOSEC) 40 MG capsule TAKE 1 CAPSULE DAILY 30 MINUTES BEFORE A MEAL 05/10/17   Shawnee Knapp, MD  oxyCODONE-acetaminophen (PERCOCET) 10-325 MG tablet Take 1 tablet by mouth every 4 (four) hours as needed for pain.    [provider]  promethazine-dextromethorphan (PROMETHAZINE-DM) 6.25-15 MG/5ML syrup Take 5 mLs by mouth 4 (four) times daily as needed for cough. 05/22/18   Horald Pollen, MD  rosuvastatin (CRESTOR) 20 MG tablet TAKE 1 TABLET EVERY DAY 06/07/18   Shawnee Knapp, MD  tiZANidine (ZANAFLEX) 4 MG tablet Take 4 mg by mouth every 6 (six) hours as needed for muscle spasms.    [provider]  traZODone (DESYREL) 100 MG tablet Take 1 tablet (100 mg total) by mouth at bedtime as needed for sleep. 11/24/16   Shawnee Knapp, MD     Past Medical History:  Diagnosis Date  . Allergy   . Anxiety   . Arthritis   . Back pain, chronic   . Bipolar disorder ([place])   . Bronchitis    hx-no inhalers at home  . Carotid bruit   . Carpal tunnel syndrome on both sides   . Coronary artery disease   . DDD (degenerative disc disease), lumbar   . Depression   . Diabetes mellitus    diet controlled  . Ejection fraction    .  Marland Kitchen GERD (gastroesophageal reflux disease)   . Hx of CABG   . Hypercholesteremia   .  Hypertension   . Hypothyroidism   . Multiple thyroid nodules   . Myocardial infarction ([place]) 11/20/06  . Neuromuscular disorder ([place])   . Nodule of neck     Past Surgical History:  Procedure Laterality Date  . ABDOMINAL HYSTERECTOMY  1984  . BREAST EXCISIONAL BIOPSY Right    benign  . BREAST EXCISIONAL BIOPSY Left    benign  . BREAST SURGERY     rt br cystx2  . CORONARY ARTERY BYPASS GRAFT  2008  . KNEE SURGERY  2009   lt   . ORIF ANKLE FRACTURE Right 07/12/2012   Procedure: OPEN REDUCTION INTERNAL FIXATION (ORIF) RIGHT ANKLE FRACTURE;  Surgeon: Alta Corning, MD;  Location: [facility];  Service: Orthopedics;  Laterality: Right;  . SHOULDER SURGERY  11/08/2016  . TUBAL LIGATION    . WRIST SURGERY Left     Social History   Tobacco Use  . Smoking status: Current Every Day Smoker    Packs/day: 1.00    Years: 33.00    Pack years: 33.00    Types: Cigarettes  . Smokeless tobacco: Never Used  Substance Use Topics  . Alcohol use: No    Family History  Problem Relation Age of Onset  . Heart disease Mother   . Hyperlipidemia Mother   . Hypertension Mother   . Heart disease Father   . Stroke Father   . Hypertension Father   . Hyperlipidemia Father   . Hypertension Sister   . Hypertension Brother   . Cancer Daughter        unknown  . Hypertension Maternal Grandmother   . Hypertension Maternal Grandfather   . Cancer Maternal Grandfather   . Hypertension Paternal Grandmother   .  Hypertension Paternal Grandfather   . Cancer Maternal Aunt        breast and ovarian  . Cancer Paternal Aunt        breast  . Colon cancer Neg Hx     Review of Systems  Constitutional: Negative for chills and fever.  Respiratory: Negative for cough and shortness of breath.   Cardiovascular: Negative for chest pain, palpitations and leg swelling.  Gastrointestinal: Negative for abdominal pain, nausea and vomiting.     OBJECTIVE:  Blood pressure 126/72, pulse 64, temperature 98.6 F (37 C), temperature source Oral, height _0  (1.6 m), weight 151 lb (68.5 kg), SpO2 98 %. Body mass index is 26.75 kg/m.   BP Readings from Last 3 Encounters:  06/24/18 126/72  06/10/18 108/70  05/30/18 117/68    Physical Exam Vitals signs and nursing note reviewed. Exam conducted with a chaperone present.  Constitutional:      Appearance: She is well-developed.  HENT:     Head: Normocephalic and atraumatic.     Mouth/Throat:     Pharynx: No oropharyngeal exudate.  Eyes:     General: No scleral icterus.    Conjunctiva/sclera: Conjunctivae normal.     Pupils: Pupils are equal, round, and reactive to light.  Neck:     Musculoskeletal: Neck supple.  Cardiovascular:     Rate and Rhythm: Normal rate and regular rhythm.     Heart sounds: Normal heart sounds. No murmur. No friction rub. No gallop.   Pulmonary:     Effort: Pulmonary effort is normal.     Breath sounds: Normal breath sounds. No wheezing or rales.  Chest:     Breasts:        Right: No inverted nipple, mass, nipple discharge  or skin change.        Left: No inverted nipple, mass, nipple discharge or skin change.  Genitourinary:    Labia:        Right: No rash or lesion.        Left: No rash or lesion.      Vagina: No vaginal discharge or lesions.     Uterus: Absent.      Adnexa:        Right: No mass or tenderness.         Left: No mass or tenderness.       Comments: Cervix not visualized Vaginal pouch wo  concerns Lymphadenopathy:     Upper Body:     Right upper body: No supraclavicular, axillary or pectoral adenopathy.     Left upper body: No supraclavicular, axillary or pectoral adenopathy.  Skin:    General: Skin is warm and dry.  Neurological:     Mental Status: She is alert and oriented to person, place, and time.    ASSESSMENT and PLAN  1. Women's annual routine gynecological examination No concerns per history or exam. Routine HCM labs ordered. HCM reviewed/discussed. Anticipatory guidance regarding healthy weight, lifestyle and choices given.   2. Type 2 diabetes mellitus without complication, without long-term current use of insulin (HCC) Managed by endo - Lipid panel - Microalbumin / creatinine urine ratio - CMP14+EGFR  3. Pure hypercholesterolemia Checking labs today, medications will be adjusted as needed.   4. Polycythemia, secondary - CBC  5. Benign essential HTN Controlled. Continue current regime.   6. Hypothyroidism following radioiodine therapy Managed by endo  7. Screening for colon cancer - Ambulatory referral to Gastroenterology  8. History of colonic polyps - Ambulatory referral to Gastroenterology  9. S/P hysterectomy  Other orders - fluticasone (FLONASE) 50 MCG/ACT nasal spray; Place 1 spray into both nostrils 2 (two) times daily. - montelukast (SINGULAIR) 10 MG tablet; Take 1 tablet (10 mg total) by mouth at bedtime. - Zoster Vaccine Adjuvanted [facility]) injection; Inject 0.5 mLs into the muscle once for 1 dose.    Return in about 3 months (around 09/22/2018).    Rutherford Guys, MD Primary Care at [place],  [address] Ph.  [phone] Fax [phone]

## 2018-06-24 NOTE — Patient Instructions (Signed)
° ° ° °  If you have lab work done today you will be contacted with your lab results within the next 2 weeks.  If you have not heard from us then please contact us. The fastest way to get your results is to register for My Chart. ° ° °IF you received an x-ray today, you will receive an invoice from [facility]. Please contact [facility] at [phone] with questions or concerns regarding your invoice.  ° °IF you received labwork today, you will receive an invoice from [facility]. Please contact [facility] at [phone] with questions or concerns regarding your invoice.  ° °Our billing staff will not be able to assist you with questions regarding bills from these companies. ° °You will be contacted with the lab results as soon as they are available. The fastest way to get your results is to activate your My Chart account. Instructions are located on the last page of this paperwork. If you have not heard from us regarding the results in 2 weeks, please contact this office. °  ° ° ° °

## 2018-06-25 LAB — CMP14+EGFR
ALT: 9 IU/L (ref 0–32)
AST: 17 IU/L (ref 0–40)
Albumin/Globulin Ratio: 1.9 (ref 1.2–2.2)
Albumin: 4.4 g/dL (ref 3.8–4.9)
Alkaline Phosphatase: 79 IU/L (ref 39–117)
BUN/Creatinine Ratio: 17 (ref 9–23)
BUN: 13 mg/dL (ref 6–24)
Bilirubin Total: 0.2 mg/dL (ref 0.0–1.2)
CO2: 18 mmol/L — ABNORMAL LOW (ref 20–29)
Calcium: 9.7 mg/dL (ref 8.7–10.2)
Chloride: 108 mmol/L — ABNORMAL HIGH (ref 96–106)
Creatinine, Ser: 0.78 mg/dL (ref 0.57–1.00)
GFR calc Af Amer: 98 mL/min/{1.73_m2} (ref >59)
GFR calc non Af Amer: 85 mL/min/{1.73_m2} (ref >59)
Globulin, Total: 2.3 g/dL (ref 1.5–4.5)
Glucose: 117 mg/dL — ABNORMAL HIGH (ref 65–99)
Potassium: 4.4 mmol/L (ref 3.5–5.2)
Sodium: 142 mmol/L (ref 134–144)
Total Protein: 6.7 g/dL (ref 6.0–8.5)

## 2018-06-25 LAB — CBC
Hematocrit: 46.5 % (ref 34.0–46.6)
Hemoglobin: 15.2 g/dL (ref 11.1–15.9)
MCH: 28.7 pg (ref 26.6–33.0)
MCHC: 32.7 g/dL (ref 31.5–35.7)
MCV: 88 fL (ref 79–97)
Platelets: 263 10*3/uL (ref 150–450)
RBC: 5.29 x10E6/uL — ABNORMAL HIGH (ref 3.77–5.28)
RDW: 13.2 % (ref 11.7–15.4)
WBC: 8.7 10*3/uL (ref 3.4–10.8)

## 2018-06-25 LAB — LIPID PANEL
Chol/HDL Ratio: 5 ratio — ABNORMAL HIGH (ref 0.0–4.4)
Cholesterol, Total: 174 mg/dL (ref 100–199)
HDL: 35 mg/dL — ABNORMAL LOW (ref >39)
LDL Calculated: 98 mg/dL (ref 0–99)
Triglycerides: 206 mg/dL — ABNORMAL HIGH (ref 0–149)
VLDL Cholesterol Cal: 41 mg/dL — ABNORMAL HIGH (ref 5–40)

## 2018-06-25 LAB — MICROALBUMIN / CREATININE URINE RATIO
Creatinine, Urine: 24.2 mg/dL
Microalb/Creat Ratio: <12 mg/g creat (ref 0–29)
Microalbumin, Urine: <3 ug/mL

## 2018-06-25 LAB — TSH: TSH: 2.56 u[IU]/mL (ref 0.450–4.500)

## 2018-06-25 LAB — HEMOGLOBIN A1C
Est. average glucose Bld gHb Est-mCnc: 154 mg/dL
Hgb A1c MFr Bld: 7 % — ABNORMAL HIGH (ref 4.8–5.6)

## 2018-06-27 ENCOUNTER — Encounter: Payer: Self-pay | Admitting: Family Medicine

## 2018-07-02 ENCOUNTER — Encounter: Payer: Self-pay | Admitting: Endocrinology

## 2018-07-02 ENCOUNTER — Ambulatory Visit: Payer: Medicare HMO | Admitting: Endocrinology

## 2018-07-02 VITALS — BP 120/70 | HR 63 | Ht 63.0 in | Wt 150.4 lb

## 2018-07-02 DIAGNOSIS — E1151 Type 2 diabetes mellitus with diabetic peripheral angiopathy without gangrene: Secondary | ICD-10-CM

## 2018-07-02 MED ORDER — ACARBOSE 50 MG PO TABS: 50.0000 mg | ORAL_TABLET | Freq: Three times a day (TID) | ORAL | 3 refills | Status: DC

## 2018-07-02 NOTE — Progress Notes (Signed)
Subjective:    Patient ID: [name], female    DOB: 01-Sep-1960, 58 y.o.   MRN: [id number]  HPI Pt has multinodular goiter (dx'ed 2014; it was found on [place], which had been done to investigate lymphadenopathy of the neck; the nodes appeared benign, but goiter was incidentally noted; bx was c/w benign follicular nodule; she was noted in early 2015 to have suppressed TSH, and she received RAI in April of 2015; in late 2015, she was noted to have elevated TSH, and was rx'ed synthroid; f/u US in 2019 was unchanged).   She does not notice any nodule in the neck.   Pt also returns for f/u of diabetes mellitus:  DM type: 2 Dx'ed: 8333 Complications: polyneuropathy, PAD, and CAD.   Therapy: 4 oral meds.   GDM: never DKA: never Severe hypoglycemia: never Pancreatitis: never Other: She has never been on insulin; she did not tolerate invokana (nausea).   Interval history: Pt states cbg's are well-controlled.  pt states she feels well in general.   Past Medical History:  Diagnosis Date  . Allergy   . Anxiety   . Arthritis   . Back pain, chronic   . Bipolar disorder ([place])   . Bronchitis    hx-no inhalers at home  . Carotid bruit   . Carpal tunnel syndrome on both sides   . Coronary artery disease   . DDD (degenerative disc disease), lumbar   . Depression   . Diabetes mellitus    diet controlled  . Ejection fraction    .  Marland Kitchen GERD (gastroesophageal reflux disease)   . Hx of CABG   . Hypercholesteremia   . Hypertension   . Hypothyroidism   . Multiple thyroid nodules   . Myocardial infarction ([place]) 11/20/06  . Neuromuscular disorder ([place])   . Nodule of neck     Past Surgical History:  Procedure Laterality Date  . ABDOMINAL HYSTERECTOMY  1984  . BREAST EXCISIONAL BIOPSY Right    benign  . BREAST EXCISIONAL BIOPSY Left    benign  . BREAST SURGERY     rt br cystx2  . CORONARY ARTERY BYPASS GRAFT  2008  . KNEE SURGERY  2009   lt   . ORIF ANKLE FRACTURE Right 07/12/2012     Procedure: OPEN REDUCTION INTERNAL FIXATION (ORIF) RIGHT ANKLE FRACTURE;  Surgeon: Alta Corning, MD;  Location: [facility];  Service: Orthopedics;  Laterality: Right;  . SHOULDER SURGERY  11/08/2016  . TUBAL LIGATION    . WRIST SURGERY Left     Social History   Socioeconomic History  . Marital status: Married    Spouse name: Not on file  . Number of children: 2  . Years of education: Not on file  . Highest education level: 11th grade  Occupational History  . Not on file  Social Needs  . Financial resource strain: Not hard at all  . Food insecurity:    Worry: Never true    Inability: Never true  . Transportation needs:    Medical: No    Non-medical: No  Tobacco Use  . Smoking status: Current Every Day Smoker    Packs/day: 1.00    Years: 33.00    Pack years: 33.00    Types: Cigarettes  . Smokeless tobacco: Never Used  Substance and Sexual Activity  . Alcohol use: No  . Drug use: No  . Sexual activity: Not on file  Lifestyle  . Physical activity:  Days per week: 0 days    Minutes per session: 0 min  . Stress: To some extent  Relationships  . Social connections:    Talks on phone: Once a week    Gets together: Once a week    Attends religious service: Never    Active member of club or organization: No    Attends meetings of clubs or organizations: Never    Relationship status: Married  . Intimate partner violence:    Fear of current or ex partner: No    Emotionally abused: No    Physically abused: No    Forced sexual activity: No  Other Topics Concern  . Not on file  Social History Narrative  . Not on file    Current Outpatient Medications on File Prior to Visit  Medication Sig Dispense Refill  . ACCU-CHEK FASTCLIX LANCETS MISC Use to check blood sugar 3 times daily. 306 each 1  . aspirin EC 81 MG tablet Take 81 mg by mouth 2 (two) times daily.    . Bisacodyl (DULCOLAX PO) Take 1 tablet by mouth 2 (two) times daily.     . carvedilol  (COREG) 3.125 MG tablet TAKE 1 TABLET TWICE DAILY 180 tablet 1  . CYCLOSET 0.8 MG TABS TAKE 1 TABLET EVERY DAY 90 tablet 3  . diazepam (VALIUM) 5 MG tablet Take 1 tablet (5 mg total) by mouth every 12 (twelve) hours as needed for anxiety or muscle spasms. **NEED OFFICE VISIT FOR REFILL** 60 tablet 0  . DULoxetine (CYMBALTA) 60 MG capsule Take 60 mg by mouth 2 (two) times daily.    . famotidine (PEPCID) 20 MG tablet Take 1 tablet (20 mg total) by mouth 2 (two) times daily. 180 tablet 1  . fenofibrate (TRICOR) 145 MG tablet Take 1 tablet (145 mg total) by mouth daily. 90 tablet 3  . fluticasone (FLONASE) 50 MCG/ACT nasal spray Place 1 spray into both nostrils 2 (two) times daily. 16 g 5  . gabapentin (NEURONTIN) 800 MG tablet TAKE 1 TABLET FOUR TIMES DAILY 360 tablet 1  . glucose blood (ACCU-CHEK GUIDE) test strip Use to check blood sugar 3 times daily. 300 each 1  . Ipratropium-Albuterol (COMBIVENT RESPIMAT) 20-100 MCG/ACT AERS respimat Inhale 1 puff into the lungs every 6 (six) hours. 4 g 11  . levothyroxine (SYNTHROID, LEVOTHROID) 100 MCG tablet Take 1 tablet (100 mcg total) by mouth daily before breakfast. 30 tablet 11  . metFORMIN (GLUCOPHAGE) 1000 MG tablet TAKE 1 TABLET BY MOUTH DAILY WITH BREAKFAST. 90 tablet 3  . Methylnaltrexone Bromide (RELISTOR) 150 MG TABS Take 3 tablets by mouth daily. With water >30 min before first mea 270 tablet 3  . montelukast (SINGULAIR) 10 MG tablet Take 1 tablet (10 mg total) by mouth at bedtime. 30 tablet 5  . Omega-3 Fatty Acids (FISH OIL) 1000 MG CAPS Take by mouth.    Marland Kitchen omeprazole (PRILOSEC) 40 MG capsule TAKE 1 CAPSULE DAILY 30 MINUTES BEFORE A MEAL 90 capsule 3  . oxyCODONE-acetaminophen (PERCOCET) 10-325 MG tablet Take 1 tablet by mouth every 4 (four) hours as needed for pain.    . rosuvastatin (CRESTOR) 20 MG tablet TAKE 1 TABLET EVERY DAY 90 tablet 1  . tiZANidine (ZANAFLEX) 4 MG tablet Take 4 mg by mouth every 6 (six) hours as needed for muscle spasms.     . traZODone (DESYREL) 100 MG tablet Take 1 tablet (100 mg total) by mouth at bedtime as needed for sleep. 30 tablet 2  .  empagliflozin (JARDIANCE) 25 MG TABS tablet Take 25 mg by mouth daily. 90 tablet 1   No current facility-administered medications on file prior to visit.     Allergies  Allergen Reactions  . Latuda [Lurasidone Hcl] Shortness Of Breath  . Coconut Flavor Swelling and Rash  . Ibuprofen Swelling and Rash  . Prednisone Swelling and Rash  . Cyclobenzaprine   . Meloxicam Itching  . Morphine Nausea And Vomiting    Family History  Problem Relation Age of Onset  . Heart disease Mother   . Hyperlipidemia Mother   . Hypertension Mother   . Heart disease Father   . Stroke Father   . Hypertension Father   . Hyperlipidemia Father   . Hypertension Sister   . Hypertension Brother   . Cancer Daughter        unknown  . Hypertension Maternal Grandmother   . Hypertension Maternal Grandfather   . Cancer Maternal Grandfather   . Hypertension Paternal Grandmother   . Hypertension Paternal Grandfather   . Cancer Maternal Aunt        breast and ovarian  . Cancer Paternal Aunt        breast  . Colon cancer Neg Hx     BP 120/70 (BP Location: Left Arm, Patient Position: Sitting, Cuff Size: Normal)   Pulse 63   Ht 5\' 3"  (1.6 m)   Wt 150 lb 6.4 oz (68.2 kg)   SpO2 (!) 89%   BMI 26.64 kg/m    Review of Systems She has lost weight, due to her efforts    Objective:   Physical Exam VITAL SIGNS:  See vs page.  GENERAL: no distress.  NECK: 1-2 cm RUP thyroid nodule is noted Pulses: dorsalis pedis intact bilat.   MSK: no deformity of the feet CV: no leg edema.  Skin:  no ulcer on the feet.  normal temp on the feet.  Large port-wine area on the right leg and foot. Neuro: sensation is intact to touch on the feet, but decreased from normal.   Lab Results  Component Value Date   TSH 2.560 06/24/2018   Lab Results  Component Value Date   HGBA1C 7.0 (H) 06/24/2018        Assessment & Plan:  Type 2 DM, with PAD: worse.   Thyroid nodule: clinically stable.  We'll follow  Patient Instructions  I have sent a prescription to your pharmacy, to double the acarbose. Please continue the same othermedications.  check your blood sugar once a day.  vary the time of day when you check, between before the 3 meals, and at bedtime.  also check if you have symptoms of your blood sugar being too high or too low.  please keep a record of the readings and bring it to your next appointment here (or you can bring the meter itself).  You can write it on any piece of paper.  please call us sooner if your blood sugar goes below 70, or if you have a lot of readings over 200. Please come back for a follow-up appointment in 6 months.

## 2018-07-02 NOTE — Patient Instructions (Addendum)
I have sent a prescription to your pharmacy, to double the acarbose. Please continue the same othermedications.  check your blood sugar once a day.  vary the time of day when you check, between before the 3 meals, and at bedtime.  also check if you have symptoms of your blood sugar being too high or too low.  please keep a record of the readings and bring it to your next appointment here (or you can bring the meter itself).  You can write it on any piece of paper.  please call us sooner if your blood sugar goes below 70, or if you have a lot of readings over 200. Please come back for a follow-up appointment in 6 months.

## 2018-07-04 ENCOUNTER — Telehealth: Payer: Self-pay | Admitting: Endocrinology

## 2018-07-04 ENCOUNTER — Other Ambulatory Visit: Payer: Self-pay

## 2018-07-04 ENCOUNTER — Encounter: Payer: Self-pay | Admitting: Family Medicine

## 2018-07-04 DIAGNOSIS — M545 Low back pain: Secondary | ICD-10-CM | POA: Diagnosis not present

## 2018-07-04 DIAGNOSIS — Z79899 Other long term (current) drug therapy: Secondary | ICD-10-CM | POA: Diagnosis not present

## 2018-07-04 DIAGNOSIS — G8929 Other chronic pain: Secondary | ICD-10-CM | POA: Diagnosis not present

## 2018-07-04 DIAGNOSIS — M546 Pain in thoracic spine: Secondary | ICD-10-CM | POA: Diagnosis not present

## 2018-07-04 DIAGNOSIS — M47817 Spondylosis without myelopathy or radiculopathy, lumbosacral region: Secondary | ICD-10-CM | POA: Diagnosis not present

## 2018-07-04 MED ORDER — ACARBOSE 50 MG PO TABS: 50.0000 mg | ORAL_TABLET | Freq: Three times a day (TID) | ORAL | 3 refills | Status: DC

## 2018-07-04 MED ORDER — EMPAGLIFLOZIN 25 MG PO TABS: 25.0000 mg | ORAL_TABLET | Freq: Every day | ORAL | 1 refills | Status: DC

## 2018-07-04 NOTE — Telephone Encounter (Signed)
Patient called to clarify on her Acarbose. Stated to patient Dr.Ellison changed the mg from 25 to 50 and to still take it 3 times daily as stated in chart. Patient also advised it was sent to [facility] and they need to be going to [facility], [place]

## 2018-07-04 NOTE — Telephone Encounter (Signed)
Thank you for informing pt of her dosage. Rx has been re-sent as indicated below:  acarbose (PRECOSE) 50 MG tablet 270 tablet 3 07/04/2018    Sig - Route: Take 1 tablet (50 mg total) by mouth 3 (three) times daily with meals. - Oral   Sent to pharmacy as: acarbose (PRECOSE) 50 MG tablet   E-Prescribing Status: Receipt confirmed by pharmacy (07/04/2018 10:40 AM EST)

## 2018-07-04 NOTE — Addendum Note (Signed)
Addended by: Rutherford Guys on: 07/04/2018 08:51 AM   Modules accepted: Orders

## 2018-07-09 MED ORDER — ASPIRIN EC 81 MG PO TBEC: 81.0000 mg | DELAYED_RELEASE_TABLET | Freq: Every day | ORAL | Status: DC

## 2018-07-10 DIAGNOSIS — M5416 Radiculopathy, lumbar region: Secondary | ICD-10-CM | POA: Diagnosis not present

## 2018-07-15 ENCOUNTER — Encounter: Payer: Self-pay | Admitting: Cardiovascular Disease

## 2018-07-15 ENCOUNTER — Other Ambulatory Visit: Payer: Self-pay

## 2018-07-15 ENCOUNTER — Ambulatory Visit (INDEPENDENT_AMBULATORY_CARE_PROVIDER_SITE_OTHER): Payer: Medicare HMO | Admitting: Cardiovascular Disease

## 2018-07-15 VITALS — BP 122/50 | HR 63 | Ht 63.0 in | Wt 150.8 lb

## 2018-07-15 DIAGNOSIS — I2510 Atherosclerotic heart disease of native coronary artery without angina pectoris: Secondary | ICD-10-CM | POA: Diagnosis not present

## 2018-07-15 DIAGNOSIS — E782 Mixed hyperlipidemia: Secondary | ICD-10-CM

## 2018-07-15 DIAGNOSIS — E119 Type 2 diabetes mellitus without complications: Secondary | ICD-10-CM | POA: Diagnosis not present

## 2018-07-15 MED ORDER — EZETIMIBE 10 MG PO TABS: 10.0000 mg | ORAL_TABLET | Freq: Every day | ORAL | 3 refills | Status: DC

## 2018-07-15 NOTE — Progress Notes (Signed)
Cardiology Office Note:    Date:  07/15/2018   ID:  [name], DOB June 16, 1960, MRN [id number]  PCP:  Rutherford Guys, MD  Cardiologist:  Mertie Moores, MD  Electrophysiologist:  None   Referring MD: Shawnee Knapp, MD   Chief Complaint  Patient presents with  . Coronary Artery Disease     History of Present Illness:    [name] is a 58 y.o. female with a hx of coronary artery disease.  She is status post myocardial infarction and coronary artery bypass grafting in 2008.  She also has a history of hypertension, hyperlipidemia, hypothyroidism, gastroesophageal reflux disease, type 2 diabetes mellitus and bipolar disease.  She was last seen in our office by Pecolia Ades, nurse practitioner.  Has lost weight. Is walking , No angina .   Has issues with some diabetes.  Has occasional issues with glucose management.  Had CT scan of her chest for cancer screening.  She was noted to have aortic atherosclerosis as well as coronary artery disease which we have known about.  Recent lipids : Chol = 174 HDL = 35 LDL = 98  Trigs = 206    Past Medical History:  Diagnosis Date  . Allergy   . Anxiety   . Arthritis   . Back pain, chronic   . Bipolar disorder ([place])   . Bronchitis    hx-no inhalers at home  . Carotid bruit   . Carpal tunnel syndrome on both sides   . Coronary artery disease   . DDD (degenerative disc disease), lumbar   . Depression   . Diabetes mellitus    diet controlled  . Ejection fraction    .  Marland Kitchen GERD (gastroesophageal reflux disease)   . Hx of CABG   . Hypercholesteremia   . Hypertension   . Hypothyroidism   . Multiple thyroid nodules   . Myocardial infarction ([place]) 11/20/06  . Neuromuscular disorder ([place])   . Nodule of neck     Past Surgical History:  Procedure Laterality Date  . ABDOMINAL HYSTERECTOMY  1984  . BREAST EXCISIONAL BIOPSY Right    benign  . BREAST EXCISIONAL BIOPSY Left    benign  . BREAST SURGERY     rt br  cystx2  . CORONARY ARTERY BYPASS GRAFT  2008  . KNEE SURGERY  2009   lt   . ORIF ANKLE FRACTURE Right 07/12/2012   Procedure: OPEN REDUCTION INTERNAL FIXATION (ORIF) RIGHT ANKLE FRACTURE;  Surgeon: Alta Corning, MD;  Location: [facility];  Service: Orthopedics;  Laterality: Right;  . SHOULDER SURGERY  11/08/2016  . TUBAL LIGATION    . WRIST SURGERY Left     Current Medications: Current Meds  Medication Sig  . acarbose (PRECOSE) 50 MG tablet Take 1 tablet (50 mg total) by mouth 3 (three) times daily with meals.  Marland Kitchen ACCU-CHEK FASTCLIX LANCETS MISC Use to check blood sugar 3 times daily.  Marland Kitchen aspirin EC 81 MG tablet Take 1 tablet (81 mg total) by mouth daily.  . Bisacodyl (DULCOLAX PO) Take 1 tablet by mouth 2 (two) times daily.   . carvedilol (COREG) 3.125 MG tablet TAKE 1 TABLET TWICE DAILY  . CYCLOSET 0.8 MG TABS TAKE 1 TABLET EVERY DAY  . diazepam (VALIUM) 5 MG tablet Take 1 tablet (5 mg total) by mouth every 12 (twelve) hours as needed for anxiety or muscle spasms. **NEED OFFICE VISIT FOR REFILL**  . DULoxetine (CYMBALTA) 60 MG capsule Take  60 mg by mouth 2 (two) times daily.  . empagliflozin (JARDIANCE) 25 MG TABS tablet Take 25 mg by mouth daily.  . famotidine (PEPCID) 20 MG tablet Take 1 tablet (20 mg total) by mouth 2 (two) times daily.  . fenofibrate (TRICOR) 145 MG tablet Take 1 tablet (145 mg total) by mouth daily.  . fluticasone (FLONASE) 50 MCG/ACT nasal spray Place 1 spray into both nostrils 2 (two) times daily.  Marland Kitchen glucose blood (ACCU-CHEK GUIDE) test strip Use to check blood sugar 3 times daily.  . Ipratropium-Albuterol (COMBIVENT RESPIMAT) 20-100 MCG/ACT AERS respimat Inhale 1 puff into the lungs every 6 (six) hours.  Marland Kitchen levothyroxine (SYNTHROID, LEVOTHROID) 100 MCG tablet Take 1 tablet (100 mcg total) by mouth daily before breakfast.  . metFORMIN (GLUCOPHAGE) 1000 MG tablet TAKE 1 TABLET BY MOUTH DAILY WITH BREAKFAST.  Marland Kitchen Methylnaltrexone Bromide (RELISTOR) 150  MG TABS Take 3 tablets by mouth daily. With water >30 min before first mea  . montelukast (SINGULAIR) 10 MG tablet Take 1 tablet (10 mg total) by mouth at bedtime.  . Omega-3 Fatty Acids (FISH OIL) 1000 MG CAPS Take by mouth.  Marland Kitchen omeprazole (PRILOSEC) 40 MG capsule TAKE 1 CAPSULE DAILY 30 MINUTES BEFORE A MEAL  . Oxycodone HCl 10 MG TABS Take 1 tablet by mouth every 4 (four) hours as needed.  . pregabalin (LYRICA) 100 MG capsule Take 1 capsule by mouth at bedtime.  . rosuvastatin (CRESTOR) 20 MG tablet TAKE 1 TABLET EVERY DAY  . tiZANidine (ZANAFLEX) 4 MG tablet Take 4 mg by mouth every 6 (six) hours as needed for muscle spasms.  . traZODone (DESYREL) 100 MG tablet Take 1 tablet (100 mg total) by mouth at bedtime as needed for sleep.     Allergies:   Latuda [lurasidone hcl]; Coconut flavor; Ibuprofen; Prednisone; Cyclobenzaprine; Meloxicam; and Morphine   Social History   Socioeconomic History  . Marital status: Married    Spouse name: Not on file  . Number of children: 2  . Years of education: Not on file  . Highest education level: 11th grade  Occupational History  . Not on file  Social Needs  . Financial resource strain: Not hard at all  . Food insecurity:    Worry: Never true    Inability: Never true  . Transportation needs:    Medical: No    Non-medical: No  Tobacco Use  . Smoking status: Current Every Day Smoker    Packs/day: 1.00    Years: 33.00    Pack years: 33.00    Types: Cigarettes  . Smokeless tobacco: Never Used  Substance and Sexual Activity  . Alcohol use: No  . Drug use: No  . Sexual activity: Not on file  Lifestyle  . Physical activity:    Days per week: 0 days    Minutes per session: 0 min  . Stress: To some extent  Relationships  . Social connections:    Talks on phone: Once a week    Gets together: Once a week    Attends religious service: Never    Active member of club or organization: No    Attends meetings of clubs or organizations: Never     Relationship status: Married  Other Topics Concern  . Not on file  Social History Narrative  . Not on file     Family History: The patient's family history includes Cancer in her daughter, maternal aunt, maternal grandfather, and paternal aunt; Heart disease in her father and mother;  Hyperlipidemia in her father and mother; Hypertension in her brother, father, maternal grandfather, maternal grandmother, mother, paternal grandfather, paternal grandmother, and sister; Stroke in her father. There is no history of Colon cancer.  ROS:   Please see the history of present illness.     All other systems reviewed and are negative.  EKGs/Labs/Other Studies Reviewed:       EKG:     Recent Labs: 06/24/2018: ALT 9; BUN 13; Creatinine, Ser 0.78; Hemoglobin 15.2; Platelets 263; Potassium 4.4; Sodium 142; TSH 2.560  Recent Lipid Panel    Component Value Date/Time   CHOL 174 06/24/2018 1213   TRIG 206 (H) 06/24/2018 1213   HDL 35 (L) 06/24/2018 1213   CHOLHDL 5.0 (H) 06/24/2018 1213   CHOLHDL 3.8 03/13/2014 1526   VLDL 34 03/13/2014 1526   LDLCALC 98 06/24/2018 1213    Physical Exam:    VS:  BP (!) 122/50   Pulse 63   Ht 5\' 3"  (1.6 m)   Wt 150 lb 12.8 oz (68.4 kg)   SpO2 97%   BMI 26.71 kg/m     Wt Readings from Last 3 Encounters:  07/15/18 150 lb 12.8 oz (68.4 kg)  07/02/18 150 lb 6.4 oz (68.2 kg)  06/24/18 151 lb (68.5 kg)    Weght is down 3 lbs since I last saw her   GEN:  Well nourished, well developed in no acute distress HEENT: Normal NECK: No JVD; No carotid bruits LYMPHATICS: No lymphadenopathy CARDIAC: RRR, no murmurs, rubs, gallops RESPIRATORY:  Clear to auscultation without rales, wheezing or rhonchi  ABDOMEN: Soft, non-tender, non-distended MUSCULOSKELETAL:  No edema; No deformity  SKIN: Warm and dry NEUROLOGIC:  Alert and oriented x 3 PSYCHIATRIC:  Normal affect   ASSESSMENT:    1. Coronary artery disease involving native coronary artery of native heart  without angina pectoris   2. Mixed hyperlipidemia   3. Type 2 diabetes mellitus without complication, without long-term current use of insulin (HCC)    PLAN:    In order of problems listed above:  1. 1.  Coronary artery disease: He has a history of coronary artery bypass grafting several years ago.  She has not had any episodes of angina.  Urged her to stop smoking.  2.  Hyperlipidemia: Lipid levels are not quite ideal.  She still eats too much bread and pasta.  Her LDL several weeks ago was 98.  Continue Crestor 10 mg a day.  We will add Zetia 10 mg a day.  Recheck labs in 3 months.     Medication Adjustments/Labs and Tests Ordered: Current medicines are reviewed at length with the patient today.  Concerns regarding medicines are outlined above.  Orders Placed This Encounter  Procedures  . Lipid Profile  . Basic Metabolic Panel (BMET)  . Hepatic function panel   Meds ordered this encounter  Medications  . ezetimibe (ZETIA) 10 MG tablet    Sig: Take 1 tablet (10 mg total) by mouth daily.    Dispense:  90 tablet    Refill:  3    Patient Instructions  Medication Instructions:  Your physician has recommended you make the following change in your medication:  START Zetia (Ezetimibe) 10 mg once daily  If you need a refill on your cardiac medications before your next appointment, please call your pharmacy.    Lab work: Your physician recommends that you return for lab work in: 3 months  You will need to FAST for this appointment - nothing  to eat or drink after midnight the night before except water.     Testing/Procedures: None Ordered   Follow-Up: At [facility], you and your health needs are our priority.  As part of our continuing mission to provide you with exceptional heart care, we have created designated Provider Care Teams.  These Care Teams include your primary Cardiologist (physician) and Advanced Practice Providers (APPs -  Physician Assistants and Nurse  Practitioners) who all work together to provide you with the care you need, when you need it. You will need a follow up appointment in:  1 years.  Please call our office 2 months in advance to schedule this appointment.  You may see Mertie Moores, MD or one of the following Advanced Practice Providers on your designated Care Team: Richardson Dopp, PA-C [place], [place] . Daune Perch, NP      Signed, Mertie Moores, MD  07/15/2018 10:11 AM    [place]

## 2018-07-15 NOTE — Patient Instructions (Signed)
Medication Instructions:  Your physician has recommended you make the following change in your medication:  START Zetia (Ezetimibe) 10 mg once daily  If you need a refill on your cardiac medications before your next appointment, please call your pharmacy.    Lab work: Your physician recommends that you return for lab work in: 3 months  You will need to FAST for this appointment - nothing to eat or drink after midnight the night before except water.     Testing/Procedures: None Ordered   Follow-Up: At [facility], you and your health needs are our priority.  As part of our continuing mission to provide you with exceptional heart care, we have created designated Provider Care Teams.  These Care Teams include your primary Cardiologist (physician) and Advanced Practice Providers (APPs -  Physician Assistants and Nurse Practitioners) who all work together to provide you with the care you need, when you need it. You will need a follow up appointment in:  1 years.  Please call our office 2 months in advance to schedule this appointment.  You may see Mertie Moores, MD or one of the following Advanced Practice Providers on your designated Care Team: Richardson Dopp, PA-C [place], [place] . Daune Perch, NP

## 2018-07-18 ENCOUNTER — Ambulatory Visit: Payer: Medicare HMO | Admitting: Registered"

## 2018-07-19 ENCOUNTER — Other Ambulatory Visit: Payer: Self-pay

## 2018-07-19 DIAGNOSIS — E1151 Type 2 diabetes mellitus with diabetic peripheral angiopathy without gangrene: Secondary | ICD-10-CM

## 2018-07-19 MED ORDER — ACCU-CHEK GUIDE CONTROL VI LIQD: 1.0000 | 1 refills | Status: AC | PRN

## 2018-07-19 MED ORDER — ACCU-CHEK FASTCLIX LANCET KIT: 1.0000 | PACK | Freq: Three times a day (TID) | 0 refills | Status: AC

## 2018-07-22 ENCOUNTER — Other Ambulatory Visit: Payer: Self-pay

## 2018-07-22 MED ORDER — ACCU-CHEK FASTCLIX LANCETS MISC: 1 refills | Status: DC

## 2018-07-22 MED ORDER — GLUCOSE BLOOD VI STRP: ORAL_STRIP | 1 refills | Status: AC

## 2018-07-23 ENCOUNTER — Other Ambulatory Visit: Payer: Self-pay

## 2018-08-02 DIAGNOSIS — F17200 Nicotine dependence, unspecified, uncomplicated: Secondary | ICD-10-CM | POA: Diagnosis not present

## 2018-08-02 DIAGNOSIS — M546 Pain in thoracic spine: Secondary | ICD-10-CM | POA: Diagnosis not present

## 2018-08-02 DIAGNOSIS — Z79899 Other long term (current) drug therapy: Secondary | ICD-10-CM | POA: Diagnosis not present

## 2018-08-02 DIAGNOSIS — M47817 Spondylosis without myelopathy or radiculopathy, lumbosacral region: Secondary | ICD-10-CM | POA: Diagnosis not present

## 2018-08-02 DIAGNOSIS — M5410 Radiculopathy, site unspecified: Secondary | ICD-10-CM | POA: Diagnosis not present

## 2018-08-02 DIAGNOSIS — Z87891 Personal history of nicotine dependence: Secondary | ICD-10-CM | POA: Diagnosis not present

## 2018-08-05 ENCOUNTER — Ambulatory Visit: Payer: Medicare HMO | Admitting: Registered"

## 2018-08-30 DIAGNOSIS — E559 Vitamin D deficiency, unspecified: Secondary | ICD-10-CM | POA: Diagnosis not present

## 2018-08-30 DIAGNOSIS — M546 Pain in thoracic spine: Secondary | ICD-10-CM | POA: Diagnosis not present

## 2018-08-30 DIAGNOSIS — M47817 Spondylosis without myelopathy or radiculopathy, lumbosacral region: Secondary | ICD-10-CM | POA: Diagnosis not present

## 2018-08-30 DIAGNOSIS — Z79899 Other long term (current) drug therapy: Secondary | ICD-10-CM | POA: Diagnosis not present

## 2018-08-30 DIAGNOSIS — M5410 Radiculopathy, site unspecified: Secondary | ICD-10-CM | POA: Diagnosis not present

## 2018-08-30 DIAGNOSIS — I10 Essential (primary) hypertension: Secondary | ICD-10-CM | POA: Diagnosis not present

## 2018-08-30 DIAGNOSIS — M129 Arthropathy, unspecified: Secondary | ICD-10-CM | POA: Diagnosis not present

## 2018-09-11 ENCOUNTER — Encounter: Payer: Self-pay | Admitting: Gastroenterology

## 2018-09-12 ENCOUNTER — Encounter: Payer: Self-pay | Admitting: Family Medicine

## 2018-09-16 ENCOUNTER — Other Ambulatory Visit: Payer: Self-pay

## 2018-09-16 DIAGNOSIS — I10 Essential (primary) hypertension: Secondary | ICD-10-CM

## 2018-09-16 DIAGNOSIS — K219 Gastro-esophageal reflux disease without esophagitis: Secondary | ICD-10-CM

## 2018-09-16 MED ORDER — OMEPRAZOLE 40 MG PO CPDR: DELAYED_RELEASE_CAPSULE | ORAL | 3 refills | Status: AC

## 2018-09-16 MED ORDER — CARVEDILOL 3.125 MG PO TABS: ORAL_TABLET | ORAL | 1 refills | Status: DC

## 2018-09-19 ENCOUNTER — Other Ambulatory Visit: Payer: Self-pay

## 2018-09-20 ENCOUNTER — Other Ambulatory Visit: Payer: Self-pay

## 2018-09-20 ENCOUNTER — Ambulatory Visit (AMBULATORY_SURGERY_CENTER): Payer: Medicare HMO | Admitting: *Deleted

## 2018-09-20 ENCOUNTER — Encounter: Payer: Self-pay | Admitting: Gastroenterology

## 2018-09-20 VITALS — Ht 62.0 in | Wt 146.0 lb

## 2018-09-20 DIAGNOSIS — Z86010 Personal history of colonic polyps: Secondary | ICD-10-CM

## 2018-09-20 MED ORDER — NA SULFATE-K SULFATE-MG SULF 17.5-3.13-1.6 GM/177ML PO SOLN: ORAL | 0 refills | Status: DC

## 2018-09-20 NOTE — Progress Notes (Signed)
Pt's previsit is done over the phone and all paperwork (prep instructions, blank consent form to just read over, pre-procedure acknowledgement form and stamped envelope) sent to patient  No egg or soy allergy  No trouble with anesthesia or intubation per pt  No diet mediations taken  No home oxygen used or hx of sleep apnea  Registered in [place]

## 2018-09-25 ENCOUNTER — Telehealth (INDEPENDENT_AMBULATORY_CARE_PROVIDER_SITE_OTHER): Payer: Medicare HMO | Admitting: Family Medicine

## 2018-09-25 ENCOUNTER — Other Ambulatory Visit: Payer: Self-pay

## 2018-09-25 DIAGNOSIS — E7800 Pure hypercholesterolemia, unspecified: Secondary | ICD-10-CM | POA: Diagnosis not present

## 2018-09-25 DIAGNOSIS — E119 Type 2 diabetes mellitus without complications: Secondary | ICD-10-CM

## 2018-09-25 DIAGNOSIS — M47818 Spondylosis without myelopathy or radiculopathy, sacral and sacrococcygeal region: Secondary | ICD-10-CM | POA: Diagnosis not present

## 2018-09-25 DIAGNOSIS — J302 Other seasonal allergic rhinitis: Secondary | ICD-10-CM

## 2018-09-25 DIAGNOSIS — M47816 Spondylosis without myelopathy or radiculopathy, lumbar region: Secondary | ICD-10-CM | POA: Diagnosis not present

## 2018-09-25 MED ORDER — LORATADINE 10 MG PO TABS: 10.0000 mg | ORAL_TABLET | Freq: Every day | ORAL | 3 refills | Status: DC

## 2018-09-25 NOTE — Progress Notes (Signed)
Virtual Visit Note  I connected with patient on 09/25/18 at 1113am by phone and verified that I am speaking with the correct person using two identifiers. [name] is currently located at home and patient is currently with them during visit. The provider, Rutherford Guys, MD is located in their office at time of visit.  I discussed the limitations, risks, security and privacy concerns of performing an evaluation and management service by telephone and the availability of in person appointments. I also discussed with the patient that there may be a patient responsible charge related to this service. The patient expressed understanding and agreed to proceed.   CC: routine followup  HPI ? Patient is a 58 y.o. female with past medical history significant for DM2, hypothyroidism, HTN, HLP, CAD, DDD, tobacco use who presents today for routine followup  Last OV feb 2020 Since then saw endo, raised acarbose Also saw cards, started zetia  She has been working on watching her diet Down 146 lbs Trying to exercise in her yard Tolerating all meds wo any issues  Later today will be having a nerve block to SI joint  Allergies not well controlled Taking flonase BID and singulair  Patient Care Team: Rutherford Guys, MD as PCP - General (Family Medicine) Nahser, Wonda Cheng, MD as PCP - Cardiology (Cardiology) Phylliss Bob, MD as Consulting Physician (Orthopedic Surgery) Renato Shin, MD as Consulting Physician (Endocrinology) Nahser, Wonda Cheng, MD as Consulting Physician (Cardiology) [facility] as Consulting Physician  Wt Readings from Last 3 Encounters:  09/20/18 146 lb (66.2 kg)  07/15/18 150 lb 12.8 oz (68.4 kg)  07/02/18 150 lb 6.4 oz (68.2 kg)    Allergies  Allergen Reactions  . Latuda [Lurasidone Hcl] Shortness Of Breath  . Coconut Flavor Swelling and Rash  . Ibuprofen Swelling and Rash  . Prednisone Swelling and Rash   . Cyclobenzaprine     "almost made heart stop, couldn't breathe"  . Meloxicam Itching  . Morphine Nausea And Vomiting    Prior to Admission medications   Medication Sig Start Date End Date Taking? Authorizing Provider  acarbose (PRECOSE) 50 MG tablet Take 1 tablet (50 mg total) by mouth 3 (three) times daily with meals. 07/04/18   Renato Shin, MD  Accu-Chek FastClix Lancets MISC Use to check blood sugar 3 times daily. 07/22/18   Renato Shin, MD  aspirin EC 81 MG tablet Take 1 tablet (81 mg total) by mouth daily. 07/09/18   Nahser, Wonda Cheng, MD  Bisacodyl (DULCOLAX PO) Take 1 tablet by mouth 2 (two) times daily.     [provider]  Blood Glucose Calibration (ACCU-CHEK GUIDE CONTROL) LIQD 1 each by Does not apply route as needed. Use to calibrate glucometer prn; E11.9 07/19/18   Renato Shin, MD  carvedilol (COREG) 3.125 MG tablet TAKE 1 TABLET TWICE DAILY 09/16/18   Rutherford Guys, MD  CYCLOSET 0.8 MG TABS TAKE 1 TABLET EVERY DAY 06/03/18   Renato Shin, MD  diazepam (VALIUM) 5 MG tablet Take 1 tablet (5 mg total) by mouth every 12 (twelve) hours as needed for anxiety or muscle spasms. **NEED OFFICE VISIT FOR REFILL** 02/23/18   Shawnee Knapp, MD  DULoxetine (CYMBALTA) 60 MG capsule Take 60 mg by mouth 2 (two) times daily.    [provider]  empagliflozin (JARDIANCE) 25 MG TABS tablet Take 25 mg by mouth daily. 07/04/18   Rutherford Guys, MD  ezetimibe (ZETIA) 10 MG  tablet Take 1 tablet (10 mg total) by mouth daily. 07/15/18 07/10/19  Nahser, Wonda Cheng, MD  famotidine (PEPCID) 20 MG tablet Take 1 tablet (20 mg total) by mouth 2 (two) times daily. 04/15/18   Shawnee Knapp, MD  fenofibrate (TRICOR) 145 MG tablet Take 1 tablet (145 mg total) by mouth daily. 01/18/18   Nahser, Wonda Cheng, MD  fluticasone (FLONASE) 50 MCG/ACT nasal spray Place 1 spray into both nostrils 2 (two) times daily. 06/24/18   Rutherford Guys, MD  glucose blood (ACCU-CHEK GUIDE) test strip Use to check blood sugar 3  times daily. 07/22/18   Renato Shin, MD  Ipratropium-Albuterol (COMBIVENT RESPIMAT) 20-100 MCG/ACT AERS respimat Inhale 1 puff into the lungs every 6 (six) hours. 05/10/17   Shawnee Knapp, MD  Lancets Misc. (ACCU-CHEK FASTCLIX LANCET) KIT 1 each by Does not apply route 3 (three) times daily. Use to monitor glucose levels TID; E11.9 07/19/18   Renato Shin, MD  levothyroxine (SYNTHROID, LEVOTHROID) 100 MCG tablet Take 1 tablet (100 mcg total) by mouth daily before breakfast. 10/10/17   Renato Shin, MD  metFORMIN (GLUCOPHAGE) 1000 MG tablet TAKE 1 TABLET BY MOUTH DAILY WITH BREAKFAST. 07/31/17   Shawnee Knapp, MD  Methylnaltrexone Bromide (RELISTOR) 150 MG TABS Take 3 tablets by mouth daily. With water >30 min before first mea 07/31/17   Shawnee Knapp, MD  montelukast (SINGULAIR) 10 MG tablet Take 1 tablet (10 mg total) by mouth at bedtime. 06/24/18   Rutherford Guys, MD  Na Sulfate-K Sulfate-Mg Sulf 17.5-3.13-1.6 GM/177ML SOLN Suprep as directed, no substitutions 09/20/18   Nandigam, Venia Minks, MD  Omega-3 Fatty Acids (FISH OIL) 1000 MG CAPS Take by mouth.    [provider]  omeprazole (PRILOSEC) 40 MG capsule TAKE 1 CAPSULE DAILY 30 MINUTES BEFORE A MEAL 09/16/18   Rutherford Guys, MD  Oxycodone HCl 10 MG TABS Take 1 tablet by mouth every 4 (four) hours as needed. 07/04/18   [provider]  pregabalin (LYRICA) 100 MG capsule Take 1 capsule by mouth at bedtime. 07/04/18   [provider]  rosuvastatin (CRESTOR) 20 MG tablet TAKE 1 TABLET EVERY DAY 06/07/18   Shawnee Knapp, MD  tiZANidine (ZANAFLEX) 4 MG tablet Take 4 mg by mouth every 6 (six) hours as needed for muscle spasms.    [provider]  traZODone (DESYREL) 100 MG tablet Take 1 tablet (100 mg total) by mouth at bedtime as needed for sleep. 11/24/16   Shawnee Knapp, MD    Past Medical History:  Diagnosis Date  . Allergy   . Anxiety   . Arthritis   . Back pain, chronic   . Bipolar disorder ([place])   . Bronchitis    hx-no  inhalers at home  . Carotid bruit   . Carpal tunnel syndrome on both sides   . Cataract   . Coronary artery disease   . DDD (degenerative disc disease), lumbar   . Depression   . Diabetes mellitus    diet controlled  . Ejection fraction    .  Marland Kitchen GERD (gastroesophageal reflux disease)   . Hx of CABG   . Hypercholesteremia   . Hypertension   . Hypothyroidism   . Multiple thyroid nodules   . Myocardial infarction ([place]) 11/20/06  . Neuromuscular disorder ([place])   . Nodule of neck     Past Surgical History:  Procedure Laterality Date  . ABDOMINAL HYSTERECTOMY  1984  . BREAST EXCISIONAL  BIOPSY Right    benign  . BREAST EXCISIONAL BIOPSY Left    benign  . BREAST SURGERY     rt br cystx2  . COLONOSCOPY    . CORONARY ARTERY BYPASS GRAFT  2008  . KNEE SURGERY  2009   lt   . ORIF ANKLE FRACTURE Right 07/12/2012   Procedure: OPEN REDUCTION INTERNAL FIXATION (ORIF) RIGHT ANKLE FRACTURE;  Surgeon: Alta Corning, MD;  Location: [facility];  Service: Orthopedics;  Laterality: Right;  . SHOULDER SURGERY  11/08/2016  . TUBAL LIGATION    . WRIST SURGERY Left     Social History   Tobacco Use  . Smoking status: Current Every Day Smoker    Packs/day: 1.00    Years: 33.00    Pack years: 33.00    Types: Cigarettes  . Smokeless tobacco: Never Used  Substance Use Topics  . Alcohol use: No    Family History  Problem Relation Age of Onset  . Heart disease Mother   . Hyperlipidemia Mother   . Hypertension Mother   . Heart disease Father   . Stroke Father   . Hypertension Father   . Hyperlipidemia Father   . Hypertension Sister   . Hypertension Brother   . Cancer Daughter        5 different kinds of cancer  . Hypertension Maternal Grandmother   . Hypertension Maternal Grandfather   . Cancer Maternal Grandfather   . Hypertension Paternal Grandmother   . Hypertension Paternal Grandfather   . Cancer Maternal Aunt        breast and ovarian  . Cancer Paternal Aunt         breast  . Colon cancer Neg Hx   . Esophageal cancer Neg Hx   . Rectal cancer Neg Hx   . Stomach cancer Neg Hx     ROS Per hpi  Objective  Vitals as reported by the patient: per above   ASSESSMENT and PLAN  1. Seasonal allergies Adding claritin, reviewed r/se/b. Advised use of nasal saline washes  2. Type 2 diabetes mellitus without complication, without long-term current use of insulin ([place]) Managed by endo, tolerating all meds well. Working on LFM  3. Pure hypercholesterolemia Managed by cards, tolerating all meds well. Working on [employer]  Other orders - loratadine (CLARITIN) 10 MG tablet; Take 1 tablet (10 mg total) by mouth daily.  FOLLOW-UP: 6 months   The above assessment and management plan was discussed with the patient. The patient verbalized understanding of and has agreed to the management plan. Patient is aware to call the clinic if symptoms persist or worsen. Patient is aware when to return to the clinic for a follow-up visit. Patient educated on when it is appropriate to go to the emergency department.    I provided 10 minutes of non-face-to-face time during this encounter.  Rutherford Guys, MD Primary Care at [facility], [place] [address] Ph.  [phone] Fax [phone]

## 2018-09-25 NOTE — Progress Notes (Signed)
Following up on diabetes, needing no refills at this time. Does monitor cbg on daily. Did not take today as of yet. Says the numbers are usually in good range. She is having the block procedure done on her back at 1 pm.

## 2018-09-27 ENCOUNTER — Encounter: Payer: Self-pay | Admitting: Endocrinology

## 2018-09-30 ENCOUNTER — Other Ambulatory Visit: Payer: Self-pay

## 2018-09-30 ENCOUNTER — Telehealth: Payer: Self-pay | Admitting: Endocrinology

## 2018-09-30 ENCOUNTER — Telehealth: Payer: Self-pay

## 2018-09-30 DIAGNOSIS — M5410 Radiculopathy, site unspecified: Secondary | ICD-10-CM | POA: Diagnosis not present

## 2018-09-30 DIAGNOSIS — E1151 Type 2 diabetes mellitus with diabetic peripheral angiopathy without gangrene: Secondary | ICD-10-CM

## 2018-09-30 DIAGNOSIS — M546 Pain in thoracic spine: Secondary | ICD-10-CM | POA: Diagnosis not present

## 2018-09-30 DIAGNOSIS — F17210 Nicotine dependence, cigarettes, uncomplicated: Secondary | ICD-10-CM | POA: Diagnosis not present

## 2018-09-30 DIAGNOSIS — Z87891 Personal history of nicotine dependence: Secondary | ICD-10-CM | POA: Diagnosis not present

## 2018-09-30 DIAGNOSIS — Z79891 Long term (current) use of opiate analgesic: Secondary | ICD-10-CM | POA: Diagnosis not present

## 2018-09-30 DIAGNOSIS — M47817 Spondylosis without myelopathy or radiculopathy, lumbosacral region: Secondary | ICD-10-CM | POA: Diagnosis not present

## 2018-09-30 DIAGNOSIS — Z1159 Encounter for screening for other viral diseases: Secondary | ICD-10-CM | POA: Diagnosis not present

## 2018-09-30 DIAGNOSIS — Z79899 Other long term (current) drug therapy: Secondary | ICD-10-CM | POA: Diagnosis not present

## 2018-09-30 MED ORDER — EMPAGLIFLOZIN 25 MG PO TABS: 25.0000 mg | ORAL_TABLET | Freq: Every day | ORAL | 1 refills | Status: DC

## 2018-09-30 MED ORDER — METFORMIN HCL 1000 MG PO TABS: 1000.0000 mg | ORAL_TABLET | Freq: Every day | ORAL | 3 refills | Status: DC

## 2018-09-30 NOTE — Telephone Encounter (Signed)
empagliflozin (JARDIANCE) 25 MG TABS tablet 90 tablet 1 09/30/2018    Sig - Route: Take 25 mg by mouth daily. - Oral   Sent to pharmacy as: empagliflozin (JARDIANCE) 25 MG Tab tablet   E-Prescribing Status: Receipt confirmed by pharmacy (09/30/2018 8:49 AM EDT)

## 2018-09-30 NOTE — Telephone Encounter (Signed)
MEDICATION: empagliflozin (JARDIANCE) 25 MG TABS tablet  PHARMACY:   [facility], [place] [phone] (Phone) [phone] (Fax)     IS THIS A 90 DAY SUPPLY : Yes  IS PATIENT OUT OF MEDICATION: No  IF NOT; HOW MUCH IS LEFT: 2 weeks  LAST APPOINTMENT DATE: @3 /24/2020  NEXT APPOINTMENT DATE:@9 /06/2018  DO WE HAVE YOUR PERMISSION TO LEAVE A DETAILED MESSAGE: Yes  OTHER COMMENTS:    **Let patient know to contact pharmacy at the end of the day to make sure medication is ready. **  ** Please notify patient to allow 48-72 hours to process**  **Encourage patient to contact the pharmacy for refills or they can request refills through MYCHART**Patient states RX for  is supposed to be sent to :     Not

## 2018-09-30 NOTE — Telephone Encounter (Signed)
Covid-19 screening questions  Have you traveled in the last 14 days? No. If yes where?  Do you now or have you had a fever in the last 14 days? No.  Do you have any respiratory symptoms of shortness of breath or cough now or in the last 14 days? No.  Do you have any family members or close contacts with diagnosed or suspected Covid-19 in the past 14 days? No.  Have you been tested for Covid-19 and found to be positive? Took a covid antibody test on 09/30/2018.

## 2018-10-01 ENCOUNTER — Telehealth: Payer: Self-pay | Admitting: Gastroenterology

## 2018-10-01 ENCOUNTER — Telehealth: Payer: Self-pay | Admitting: *Deleted

## 2018-10-01 NOTE — Telephone Encounter (Signed)
Meghan from [facility] called and stated that the pt had a covid test today and tested positive for IGM and neg. For IGG and wants to know if the pt can still have procedure tomorrow. She stated please gv her a call at [phone] and if she is in a room request for her to be pulled. Thanks

## 2018-10-01 NOTE — Telephone Encounter (Signed)
Pt tested positive for IGM today but NEg for IGG. She was tested by her pain Doctor and she is on Medicare. This test was free and she is symptom free. Spoke with Dr. Hilarie Fredrickson and Dr. Silverio Decamp. She is fine for the procedure. SM

## 2018-10-02 ENCOUNTER — Encounter: Payer: Self-pay | Admitting: Gastroenterology

## 2018-10-02 ENCOUNTER — Ambulatory Visit (AMBULATORY_SURGERY_CENTER): Payer: Medicare HMO | Admitting: Gastroenterology

## 2018-10-02 ENCOUNTER — Other Ambulatory Visit: Payer: Self-pay

## 2018-10-02 VITALS — BP 121/68 | HR 47 | Temp 98.6°F | Resp 22 | Ht 63.0 in | Wt 150.0 lb

## 2018-10-02 DIAGNOSIS — D127 Benign neoplasm of rectosigmoid junction: Secondary | ICD-10-CM | POA: Diagnosis not present

## 2018-10-02 DIAGNOSIS — D125 Benign neoplasm of sigmoid colon: Secondary | ICD-10-CM | POA: Diagnosis not present

## 2018-10-02 DIAGNOSIS — D128 Benign neoplasm of rectum: Secondary | ICD-10-CM | POA: Diagnosis not present

## 2018-10-02 DIAGNOSIS — Z86010 Personal history of colonic polyps: Secondary | ICD-10-CM

## 2018-10-02 DIAGNOSIS — D123 Benign neoplasm of transverse colon: Secondary | ICD-10-CM | POA: Diagnosis not present

## 2018-10-02 MED ORDER — SODIUM CHLORIDE 0.9 % IV SOLN: 500.0000 mL | Freq: Once | INTRAVENOUS | Status: DC

## 2018-10-02 NOTE — Patient Instructions (Signed)
Please read handouts provided. Continue present medications. Await pathology results.        YOU HAD AN ENDOSCOPIC PROCEDURE TODAY AT THE [facility]:   Refer to the procedure report that was given to you for any specific questions about what was found during the examination.  If the procedure report does not answer your questions, please call your gastroenterologist to clarify.  If you requested that your care partner not be given the details of your procedure findings, then the procedure report has been included in a sealed envelope for you to review at your convenience later.  YOU SHOULD EXPECT: Some feelings of bloating in the abdomen. Passage of more gas than usual.  Walking can help get rid of the air that was put into your GI tract during the procedure and reduce the bloating. If you had a lower endoscopy (such as a colonoscopy or flexible sigmoidoscopy) you may notice spotting of blood in your stool or on the toilet paper. If you underwent a bowel prep for your procedure, you may not have a normal bowel movement for a few days.  Please Note:  You might notice some irritation and congestion in your nose or some drainage.  This is from the oxygen used during your procedure.  There is no need for concern and it should clear up in a day or so.  SYMPTOMS TO REPORT IMMEDIATELY:   Following lower endoscopy (colonoscopy or flexible sigmoidoscopy):  Excessive amounts of blood in the stool  Significant tenderness or worsening of abdominal pains  Swelling of the abdomen that is new, acute  Fever of 100F or higher    For urgent or emergent issues, a gastroenterologist can be reached at any hour by calling [phone].   DIET:  We do recommend a small meal at first, but then you may proceed to your regular diet.  Drink plenty of fluids but you should avoid alcoholic beverages for 24 hours.  ACTIVITY:  You should plan to take it easy for the rest of today and you should NOT  DRIVE or use heavy machinery until tomorrow (because of the sedation medicines used during the test).    FOLLOW UP: Our staff will call the number listed on your records 48-72 hours following your procedure to check on you and address any questions or concerns that you may have regarding the information given to you following your procedure. If we do not reach you, we will leave a message.  We will attempt to reach you two times.  During this call, we will ask if you have developed any symptoms of COVID 19. If you develop any symptoms (ie: fever, flu-like symptoms, shortness of breath, cough etc.) before then, please call [phone].  If you test positive for Covid 19 in the 2 weeks post procedure, please call and report this information to us.    If any biopsies were taken you will be contacted by phone or by letter within the next 1-3 weeks.  Please call us at [phone] if you have not heard about the biopsies in 3 weeks.    SIGNATURES/CONFIDENTIALITY: You and/or your care partner have signed paperwork which will be entered into your electronic medical record.  These signatures attest to the fact that that the information above on your After Visit Summary has been reviewed and is understood.  Full responsibility of the confidentiality of this discharge information lies with you and/or your care-partner. 

## 2018-10-02 NOTE — Progress Notes (Signed)
Called to room to assist during endoscopic procedure.  Patient ID and intended procedure confirmed with present staff. Received instructions for my participation in the procedure from the performing physician.  

## 2018-10-02 NOTE — Progress Notes (Signed)
To PACU, VSS. Report to [email or web address] 

## 2018-10-02 NOTE — Progress Notes (Signed)
Pt's states no medical or surgical changes since previsit or office visit. 

## 2018-10-02 NOTE — Op Note (Addendum)
[facility] Patient Name: [name] Procedure Date: 10/02/2018 7:32 AM MRN: [id number] Endoscopist: Mauri Pole , MD Age: 58 Referring MD:  Date of Birth: 01-14-1961 Gender: Female Account #: [id number] Procedure:                Colonoscopy Indications:              High risk colon cancer surveillance: Personal                            history of colonic polyps, High risk colon cancer                            surveillance: Personal history of adenoma (10 mm or                            greater in size) Medicines:                Monitored Anesthesia Care Procedure:                Pre-Anesthesia Assessment:                           - Prior to the procedure, a History and Physical                            was performed, and patient medications and                            allergies were reviewed. The patient's tolerance of                            previous anesthesia was also reviewed. The risks                            and benefits of the procedure and the sedation                            options and risks were discussed with the patient.                            All questions were answered, and informed consent                            was obtained. Prior Anticoagulants: The patient has                            taken no previous anticoagulant or antiplatelet                            agents. ASA Grade Assessment: III - A patient with                            severe systemic disease. After reviewing the risks  and benefits, the patient was deemed in                            satisfactory condition to undergo the procedure.                           After obtaining informed consent, the colonoscope                            was passed under direct vision. Throughout the                            procedure, the patient's blood pressure, pulse, and                            oxygen saturations were monitored  continuously. The                            Colonoscope was introduced through the anus and                            advanced to the the cecum, identified by                            appendiceal orifice and ileocecal valve. The                            colonoscopy was performed without difficulty. The                            patient tolerated the procedure well. The quality                            of the bowel preparation was adequate. The                            ileocecal valve, appendiceal orifice, and rectum                            were photographed. Scope In: 7:50:41 AM Scope Out: 8:20:19 AM Scope Withdrawal Time: 0 hours 24 minutes 59 seconds  Total Procedure Duration: 0 hours 29 minutes 38 seconds  Findings:                 The perianal and digital rectal examinations were                            normal.                           Four sessile polyps were found in the sigmoid colon                            and transverse colon. The polyps were 4 to 7 mm in  size. These polyps were removed with a cold snare.                            Resection and retrieval were complete.                           Two sessile polyps were found in the rectum and                            transverse colon. The polyps were 1 to 2 mm in                            size. These polyps were removed with a cold biopsy                            forceps. Resection and retrieval were complete.                           Multiple small and large-mouthed diverticula were                            found in the sigmoid colon, descending colon,                            transverse colon and ascending colon.                           Non-bleeding internal hemorrhoids were found during                            retroflexion. The hemorrhoids were small. Complications:            No immediate complications. Estimated Blood Loss:     Estimated blood loss was  minimal. Impression:               - Four 4 to 7 mm polyps in the sigmoid colon and in                            the transverse colon, removed with a cold snare.                            Resected and retrieved.                           - Two 1 to 2 mm polyps in the rectum and in the                            transverse colon, removed with a cold biopsy                            forceps. Resected and retrieved.                           - Moderate diverticulosis in the  sigmoid colon, in                            the descending colon, in the transverse colon and                            in the ascending colon.                           - Non-bleeding internal hemorrhoids. Recommendation:           - Patient has a contact number available for                            emergencies. The signs and symptoms of potential                            delayed complications were discussed with the                            patient. Return to normal activities tomorrow.                            Written discharge instructions were provided to the                            patient.                           - Resume previous diet.                           - Continue present medications.                           - Await pathology results.                           - Repeat colonoscopy in 3 years for surveillance                            based on pathology results. Mauri Pole, MD 10/02/2018 8:29:18 AM This report has been signed electronically.

## 2018-10-04 ENCOUNTER — Telehealth: Payer: Self-pay

## 2018-10-04 NOTE — Telephone Encounter (Signed)
Attempted to reach pt. With follow-up call following endoscopic procedure 10/02/2018.   LM on pt. Ans. Machine.  Will try to reach pt. Again later today.

## 2018-10-04 NOTE — Telephone Encounter (Signed)
  Follow up Call-  Call back number 10/02/2018  Post procedure Call Back phone  # [phone]  Permission to leave phone message Yes  Some recent data might be hidden     Patient questions:  Do you have a fever, pain , or abdominal swelling? No. Pain Score  0 *  Have you tolerated food without any problems? Yes.    Have you been able to return to your normal activities? Yes.    Do you have any questions about your discharge instructions: Diet   No. Medications  No. Follow up visit  No.  Do you have questions or concerns about your Care? No.  Actions: * If pain score is 4 or above: No action needed, pain <4.  1. Have you developed a fever since your procedure? no  2.   Have you had an respiratory symptoms (SOB or cough) since your procedure? no  3.   Have you tested positive for COVID 19 since your procedure no  4.   Have you had any family members/close contacts diagnosed with the COVID 19 since your procedure?  no   If yes to any of these questions please route to Joylene John, RN and Alphonsa Gin, Therapist, sports.

## 2018-10-10 DIAGNOSIS — Z780 Asymptomatic menopausal state: Secondary | ICD-10-CM | POA: Diagnosis not present

## 2018-10-11 ENCOUNTER — Telehealth: Payer: Self-pay | Admitting: *Deleted

## 2018-10-11 ENCOUNTER — Encounter: Payer: Self-pay | Admitting: Gastroenterology

## 2018-10-11 NOTE — Telephone Encounter (Signed)
    COVID-19 Pre-Screening Questions:  . In the past 7 to 10 days have you had a cough,  shortness of breath, headache, congestion, fever (100 or greater) body aches, chills, sore throat, or sudden loss of taste or sense of smell? . Have you been around anyone with known Covid 19. . Have you been around anyone who is awaiting Covid 19 test results in the past 7 to 10 days? . Have you been around anyone who has been exposed to Covid 19, or has mentioned symptoms of Covid 19 within the past 7 to 10 days?  If you have any concerns/questions about symptoms patients report during screening (either on the phone or at threshold). Contact the provider seeing the patient or DOD for further guidance.  If neither are available contact a member of the leadership team.          Contacted patient via phone call all Covid 19 questions were answered no . Pt has a mask. KB

## 2018-10-15 ENCOUNTER — Other Ambulatory Visit: Payer: Self-pay

## 2018-10-15 ENCOUNTER — Other Ambulatory Visit: Payer: Medicare HMO

## 2018-10-15 DIAGNOSIS — E782 Mixed hyperlipidemia: Secondary | ICD-10-CM | POA: Diagnosis not present

## 2018-10-15 DIAGNOSIS — E119 Type 2 diabetes mellitus without complications: Secondary | ICD-10-CM

## 2018-10-15 DIAGNOSIS — I2510 Atherosclerotic heart disease of native coronary artery without angina pectoris: Secondary | ICD-10-CM | POA: Diagnosis not present

## 2018-10-15 LAB — HEPATIC FUNCTION PANEL
ALT: 8 IU/L (ref 0–32)
AST: 18 IU/L (ref 0–40)
Albumin: 4.2 g/dL (ref 3.8–4.9)
Alkaline Phosphatase: 68 IU/L (ref 39–117)
Bilirubin Total: 0.3 mg/dL (ref 0.0–1.2)
Bilirubin, Direct: 0.12 mg/dL (ref 0.00–0.40)
Total Protein: 6.2 g/dL (ref 6.0–8.5)

## 2018-10-15 LAB — LIPID PANEL
Chol/HDL Ratio: 3.5 ratio (ref 0.0–4.4)
Cholesterol, Total: 127 mg/dL (ref 100–199)
HDL: 36 mg/dL — ABNORMAL LOW (ref >39)
LDL Calculated: 70 mg/dL (ref 0–99)
Triglycerides: 105 mg/dL (ref 0–149)
VLDL Cholesterol Cal: 21 mg/dL (ref 5–40)

## 2018-10-15 LAB — BASIC METABOLIC PANEL
BUN/Creatinine Ratio: 23 (ref 9–23)
BUN: 19 mg/dL (ref 6–24)
CO2: 21 mmol/L (ref 20–29)
Calcium: 9.3 mg/dL (ref 8.7–10.2)
Chloride: 108 mmol/L — ABNORMAL HIGH (ref 96–106)
Creatinine, Ser: 0.82 mg/dL (ref 0.57–1.00)
GFR calc Af Amer: 92 mL/min/{1.73_m2} (ref >59)
GFR calc non Af Amer: 80 mL/min/{1.73_m2} (ref >59)
Glucose: 108 mg/dL — ABNORMAL HIGH (ref 65–99)
Potassium: 4.2 mmol/L (ref 3.5–5.2)
Sodium: 143 mmol/L (ref 134–144)

## 2018-10-21 ENCOUNTER — Telehealth: Payer: Self-pay | Admitting: Family Medicine

## 2018-10-21 ENCOUNTER — Encounter: Payer: Self-pay | Admitting: Family Medicine

## 2018-10-21 DIAGNOSIS — Z1231 Encounter for screening mammogram for malignant neoplasm of breast: Secondary | ICD-10-CM

## 2018-10-21 NOTE — Telephone Encounter (Signed)
Spoke with pt and I advised her that her next mammagram is not due until 08/2019 she verbalized understanding.

## 2018-10-21 NOTE — Telephone Encounter (Signed)
Copied from [place] [id number]. Topic: Quick Communication - See Telephone Encounter >> Oct 21, 2018  9:17 AM Loma [place] wrote: Pt  want Dr Chauncey Cruel to schedule her for a mammogram, pt states over 1 yr and very anxious due to immediate family history. Pls sch as soon as possible.

## 2018-10-22 NOTE — Telephone Encounter (Signed)
Patient traditionally with mammogram's every year and fhx breast and ovarian cancer. She should continue with yearly mammogram. I  have placed order. Thanks.

## 2018-10-25 ENCOUNTER — Telehealth: Payer: Self-pay | Admitting: Family Medicine

## 2018-10-25 NOTE — Telephone Encounter (Signed)
Pt requesting advice on what to do with painful welts on her arm she got from the sun.

## 2018-10-28 ENCOUNTER — Encounter: Payer: Self-pay | Admitting: Emergency Medicine

## 2018-10-28 ENCOUNTER — Telehealth (INDEPENDENT_AMBULATORY_CARE_PROVIDER_SITE_OTHER): Payer: Medicare HMO | Admitting: Emergency Medicine

## 2018-10-28 VITALS — Ht 61.5 in | Wt 141.0 lb

## 2018-10-28 DIAGNOSIS — T7840XA Allergy, unspecified, initial encounter: Secondary | ICD-10-CM

## 2018-10-28 DIAGNOSIS — M47818 Spondylosis without myelopathy or radiculopathy, sacral and sacrococcygeal region: Secondary | ICD-10-CM | POA: Diagnosis not present

## 2018-10-28 DIAGNOSIS — L299 Pruritus, unspecified: Secondary | ICD-10-CM

## 2018-10-28 DIAGNOSIS — R21 Rash and other nonspecific skin eruption: Secondary | ICD-10-CM | POA: Diagnosis not present

## 2018-10-28 MED ORDER — DOXYCYCLINE HYCLATE 100 MG PO TABS: 100.0000 mg | ORAL_TABLET | Freq: Two times a day (BID) | ORAL | 0 refills | Status: AC

## 2018-10-28 MED ORDER — BENADRYL EXTRA STRENGTH 2-0.1 % EX LIQD: CUTANEOUS | 2 refills | Status: DC

## 2018-10-28 NOTE — Progress Notes (Signed)
Called patient to triage for appointment. Patient states she has welts on left arm for one week with itching. Patient states she uses anti-itch cream and Desitin if she is out in th yard, because of the sun.

## 2018-10-28 NOTE — Progress Notes (Signed)
Telemedicine Encounter- SOAP NOTE Established Patient  This telephone encounter was conducted with the patient's (or proxy's) verbal consent via audio telecommunications: yes/no: Yes Patient was instructed to have this encounter in a suitably private space; and to only have persons present to whom they give permission to participate. In addition, patient identity was confirmed by use of name plus two identifiers (DOB and address).  I discussed the limitations, risks, security and privacy concerns of performing an evaluation and management service by telephone and the availability of in person appointments. I also discussed with the patient that there may be a patient responsible charge related to this service. The patient expressed understanding and agreed to proceed.  I spent a total of TIME; 0 MIN TO 60 MIN: 10 minutes talking with the patient or their proxy.  No chief complaint on file. Itchy rash to left arm  Subjective   [name] is a 58 y.o. female established patient. Telephone visit today for itchy rash to her left arm that started 1 week ago.  Not getting better despite using over-the-counter calamine lotion.  Describes rash as red bumps, almost like hives.  Denies pain or tenderness.  No other significant symptoms.  Has a history of similar rashes always due to sun exposure.  Has been using Desitin cream for prevention.  Thinks it is secondary to medications that she takes but does not know which one.  HPI   Patient Active Problem List   Diagnosis Date Noted  . S/P hysterectomy 06/24/2018  . History of colonic polyps 06/24/2018  . Polycythemia, secondary 05/13/2017  . Diabetes ([place]) 07/29/2015  . History of myocardial infarction 12/09/2014  . Acquired hypothyroidism 10/09/2014  . Benign essential HTN 10/09/2014  . Bipolar affective disorder, depressed, moderate ([place]) 10/09/2014  . Chronic pain syndrome 10/09/2014  . Coccyx pain 10/09/2014  . Recurrent falls  10/09/2014  . 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 Multinodular goiter 09/15/2013  . Carotid bruit   . Depression   . Coronary artery disease   . Hyperlipidemia   . Neuromuscular disorder ([place])   . Arthritis   . DDD (degenerative disc disease), lumbar   . Hx of CABG   . Spinal stenosis of lumbar region 02/08/2012  . Neuropathy 02/08/2012  . Back pain, chronic   . TOBACCO ABUSE 11/20/2008    Past Medical History:  Diagnosis Date  . Allergy   . Anxiety   . Arthritis   . Back pain, chronic   . Bipolar disorder ([place])   . Bronchitis    hx-no inhalers at home  . Carotid bruit   . Carpal tunnel syndrome on both sides   . Cataract   . Coronary artery disease   . DDD (degenerative disc disease), lumbar   . Depression   . Diabetes mellitus    diet controlled  . Ejection fraction    .  Marland Kitchen GERD (gastroesophageal reflux disease)   . Hx of CABG   . Hypercholesteremia   . Hypertension   . Hypothyroidism   . Multiple thyroid nodules   . Myocardial infarction ([place]) 11/20/06  . Neuromuscular disorder ([place])   . Nodule of neck     Current Outpatient Medications  Medication Sig Dispense Refill  . acarbose (PRECOSE) 50 MG tablet Take 1 tablet (50 mg total) by mouth 3 (three) times daily with meals. 270 tablet 3  . aspirin EC 81 MG tablet Take 1 tablet (81 mg total) by mouth daily.    Marland Kitchen  Bisacodyl (DULCOLAX PO) Take 1 tablet by mouth 2 (two) times daily.     . carvedilol (COREG) 3.125 MG tablet TAKE 1 TABLET TWICE DAILY 180 tablet 1  . CYCLOSET 0.8 MG TABS TAKE 1 TABLET EVERY DAY 90 tablet 3  . diazepam (VALIUM) 5 MG tablet Take 1 tablet (5 mg total) by mouth every 12 (twelve) hours as needed for anxiety or muscle spasms. **NEED OFFICE VISIT FOR REFILL** 60 tablet 0  . DULoxetine (CYMBALTA) 60 MG capsule Take 60 mg by mouth 2 (two) times daily.    . empagliflozin (JARDIANCE) 25 MG TABS tablet Take 25 mg by mouth daily. 90 tablet 1  . ezetimibe (ZETIA) 10 MG tablet  Take 1 tablet (10 mg total) by mouth daily. 90 tablet 3  . famotidine (PEPCID) 20 MG tablet Take 1 tablet (20 mg total) by mouth 2 (two) times daily. 180 tablet 1  . fenofibrate (TRICOR) 145 MG tablet Take 1 tablet (145 mg total) by mouth daily. 90 tablet 3  . fluticasone (FLONASE) 50 MCG/ACT nasal spray Place 1 spray into both nostrils 2 (two) times daily. 16 g 5  . Ipratropium-Albuterol (COMBIVENT RESPIMAT) 20-100 MCG/ACT AERS respimat Inhale 1 puff into the lungs every 6 (six) hours. 4 g 11  . levothyroxine (SYNTHROID, LEVOTHROID) 100 MCG tablet Take 1 tablet (100 mcg total) by mouth daily before breakfast. 30 tablet 11  . loratadine (CLARITIN) 10 MG tablet Take 1 tablet (10 mg total) by mouth daily. 90 tablet 3  . metFORMIN (GLUCOPHAGE) 1000 MG tablet Take 1 tablet (1,000 mg total) by mouth daily with breakfast. 90 tablet 3  . Methylnaltrexone Bromide (RELISTOR) 150 MG TABS Take 3 tablets by mouth daily. With water >30 min before first mea 270 tablet 3  . montelukast (SINGULAIR) 10 MG tablet Take 1 tablet (10 mg total) by mouth at bedtime. 30 tablet 5  . Omega-3 Fatty Acids (FISH OIL) 1000 MG CAPS Take by mouth.    Marland Kitchen omeprazole (PRILOSEC) 40 MG capsule TAKE 1 CAPSULE DAILY 30 MINUTES BEFORE A MEAL 90 capsule 3  . Oxycodone HCl 10 MG TABS Take 1 tablet by mouth every 4 (four) hours as needed.    . pregabalin (LYRICA) 100 MG capsule Take 1 capsule by mouth at bedtime.    . rosuvastatin (CRESTOR) 20 MG tablet TAKE 1 TABLET EVERY DAY 90 tablet 1  . tiZANidine (ZANAFLEX) 4 MG tablet Take 4 mg by mouth every 6 (six) hours as needed for muscle spasms.    . Accu-Chek FastClix Lancets MISC Use to check blood sugar 3 times daily. 306 each 1  . Blood Glucose Calibration (ACCU-CHEK GUIDE CONTROL) LIQD 1 each by Does not apply route as needed. Use to calibrate glucometer prn; E11.9 1 each 1  . glucose blood (ACCU-CHEK GUIDE) test strip Use to check blood sugar 3 times daily. 300 each 1  . Lancets Misc.  (ACCU-CHEK FASTCLIX LANCET) KIT 1 each by Does not apply route 3 (three) times daily. Use to monitor glucose levels TID; E11.9 1 kit 0  . Na Sulfate-K Sulfate-Mg Sulf 17.5-3.13-1.6 GM/177ML SOLN Suprep as directed, no substitutions (Patient not taking: Reported on 10/28/2018) 354 mL 0  . traZODone (DESYREL) 100 MG tablet Take 1 tablet (100 mg total) by mouth at bedtime as needed for sleep. (Patient not taking: Reported on 10/02/2018) 30 tablet 2   No current facility-administered medications for this visit.     Allergies  Allergen Reactions  . Latuda [Lurasidone Hcl] Shortness  Of Breath  . Coconut Flavor Swelling and Rash  . Ibuprofen Swelling and Rash  . Prednisone Swelling and Rash  . Cyclobenzaprine     "almost made heart stop, couldn't breathe"  . Meloxicam Itching  . Morphine Nausea And Vomiting    Social History   Socioeconomic History  . Marital status: Married    Spouse name: Not on file  . Number of children: 2  . Years of education: Not on file  . Highest education level: 11th grade  Occupational History  . Not on file  Social Needs  . Financial resource strain: Not hard at all  . Food insecurity    Worry: Never true    Inability: Never true  . Transportation needs    Medical: No    Non-medical: No  Tobacco Use  . Smoking status: Current Every Day Smoker    Packs/day: 1.00    Years: 33.00    Pack years: 33.00    Types: Cigarettes  . Smokeless tobacco: Never Used  Substance and Sexual Activity  . Alcohol use: No  . Drug use: No  . Sexual activity: Not on file  Lifestyle  . Physical activity    Days per week: 0 days    Minutes per session: 0 min  . Stress: To some extent  Relationships  . Social Herbalist on phone: Once a week    Gets together: Once a week    Attends religious service: Never    Active member of club or organization: No    Attends meetings of clubs or organizations: Never    Relationship status: Married  . Intimate partner  violence    Fear of current or ex partner: No    Emotionally abused: No    Physically abused: No    Forced sexual activity: No  Other Topics Concern  . Not on file  Social History Narrative  . Not on file    Review of Systems  Constitutional: Negative.  Negative for chills and fever.  HENT: Negative for congestion and sore throat.   Eyes: Negative for discharge and redness.  Respiratory: Negative for cough and shortness of breath.   Cardiovascular: Negative for chest pain and palpitations.  Gastrointestinal: Negative for abdominal pain, diarrhea, nausea and vomiting.  Genitourinary: Negative for dysuria.  Musculoskeletal: Negative for back pain, myalgias and neck pain.  Skin: Positive for itching and rash.  Neurological: Negative for dizziness and headaches.  All other systems reviewed and are negative.   Objective   Vitals as reported by the patient: Today's Vitals   10/28/18 0915  Weight: 141 lb (64 kg)  Height: 5' 1.5" (1.562 m)  Awake and oriented x3 in no apparent respiratory distress.  There are no diagnoses linked to this encounter.  Diagnoses and all orders for this visit:  Rash due to allergy -     doxycycline (VIBRA-TABS) 100 MG tablet; Take 1 tablet (100 mg total) by mouth 2 (two) times daily for 5 days. -     diphenhydrAMINE (BENADRYL EXTRA STRENGTH) spray; Apply to affected area every 6-8 hours as needed.  Itchy skin    Itchy rash on the left arm most likely allergic reaction but she may have a secondary bacterial infection due to scratching.  Will start doxycycline twice a day for 5 days and Benadryl lotion.  No red flag signs or symptoms.  Clinically stable.  Advised to contact the office if no better in 2 to 3 days.  I discussed the assessment and treatment plan with the patient. The patient was provided an opportunity to ask questions and all were answered. The patient agreed with the plan and demonstrated an understanding of the instructions.   The  patient was advised to call back or seek an in-person evaluation if the symptoms worsen or if the condition fails to improve as anticipated.  I provided 10 minutes of non-face-to-face time during this encounter.  Horald Pollen, MD  Primary Care at [facility]

## 2018-10-30 DIAGNOSIS — M961 Postlaminectomy syndrome, not elsewhere classified: Secondary | ICD-10-CM | POA: Diagnosis not present

## 2018-10-30 DIAGNOSIS — M545 Low back pain: Secondary | ICD-10-CM | POA: Diagnosis not present

## 2018-10-30 DIAGNOSIS — M5410 Radiculopathy, site unspecified: Secondary | ICD-10-CM | POA: Diagnosis not present

## 2018-10-30 DIAGNOSIS — Z79899 Other long term (current) drug therapy: Secondary | ICD-10-CM | POA: Diagnosis not present

## 2018-10-31 DIAGNOSIS — H9201 Otalgia, right ear: Secondary | ICD-10-CM | POA: Diagnosis not present

## 2018-11-07 DIAGNOSIS — F319 Bipolar disorder, unspecified: Secondary | ICD-10-CM | POA: Diagnosis not present

## 2018-11-09 ENCOUNTER — Encounter: Payer: Self-pay | Admitting: Family Medicine

## 2018-11-11 MED ORDER — RELISTOR 150 MG PO TABS: 3.0000 | ORAL_TABLET | Freq: Every day | ORAL | 0 refills | Status: DC

## 2018-11-12 DIAGNOSIS — M19031 Primary osteoarthritis, right wrist: Secondary | ICD-10-CM | POA: Diagnosis not present

## 2018-11-12 DIAGNOSIS — M1811 Unilateral primary osteoarthritis of first carpometacarpal joint, right hand: Secondary | ICD-10-CM | POA: Diagnosis not present

## 2018-11-21 ENCOUNTER — Encounter: Payer: Self-pay | Admitting: Family Medicine

## 2018-11-21 ENCOUNTER — Encounter: Payer: Self-pay | Admitting: Endocrinology

## 2018-11-21 ENCOUNTER — Other Ambulatory Visit: Payer: Self-pay

## 2018-11-21 DIAGNOSIS — E042 Nontoxic multinodular goiter: Secondary | ICD-10-CM

## 2018-11-21 MED ORDER — LEVOTHYROXINE SODIUM 100 MCG PO TABS: 100.0000 ug | ORAL_TABLET | Freq: Every day | ORAL | 11 refills | Status: DC

## 2018-11-23 ENCOUNTER — Encounter: Payer: Self-pay | Admitting: Family Medicine

## 2018-11-24 ENCOUNTER — Other Ambulatory Visit: Payer: Self-pay | Admitting: *Deleted

## 2018-11-24 MED ORDER — FAMOTIDINE 20 MG PO TABS: 20.0000 mg | ORAL_TABLET | Freq: Two times a day (BID) | ORAL | 0 refills | Status: AC

## 2018-11-26 ENCOUNTER — Encounter: Payer: Self-pay | Admitting: Family Medicine

## 2018-11-26 ENCOUNTER — Other Ambulatory Visit: Payer: Self-pay | Admitting: *Deleted

## 2018-11-26 MED ORDER — ROSUVASTATIN CALCIUM 20 MG PO TABS: 20.0000 mg | ORAL_TABLET | Freq: Every day | ORAL | 0 refills | Status: DC

## 2018-11-29 DIAGNOSIS — M47817 Spondylosis without myelopathy or radiculopathy, lumbosacral region: Secondary | ICD-10-CM | POA: Diagnosis not present

## 2018-11-29 DIAGNOSIS — Z79891 Long term (current) use of opiate analgesic: Secondary | ICD-10-CM | POA: Diagnosis not present

## 2018-11-29 DIAGNOSIS — Z79899 Other long term (current) drug therapy: Secondary | ICD-10-CM | POA: Diagnosis not present

## 2018-11-29 DIAGNOSIS — M5410 Radiculopathy, site unspecified: Secondary | ICD-10-CM | POA: Diagnosis not present

## 2018-11-29 DIAGNOSIS — M546 Pain in thoracic spine: Secondary | ICD-10-CM | POA: Diagnosis not present

## 2018-11-29 DIAGNOSIS — Z87891 Personal history of nicotine dependence: Secondary | ICD-10-CM | POA: Diagnosis not present

## 2018-11-29 DIAGNOSIS — F17210 Nicotine dependence, cigarettes, uncomplicated: Secondary | ICD-10-CM | POA: Diagnosis not present

## 2018-12-06 ENCOUNTER — Other Ambulatory Visit: Payer: Self-pay | Admitting: Nurse Practitioner

## 2018-12-06 ENCOUNTER — Other Ambulatory Visit: Payer: Self-pay

## 2018-12-06 ENCOUNTER — Ambulatory Visit
Admission: RE | Admit: 2018-12-06 | Discharge: 2018-12-06 | Disposition: A | Discharge status: Home / Self Care | Payer: Medicare HMO | Source: Ambulatory Visit | Attending: Family Medicine | Admitting: Family Medicine

## 2018-12-06 DIAGNOSIS — Z1231 Encounter for screening mammogram for malignant neoplasm of breast: Secondary | ICD-10-CM

## 2018-12-18 ENCOUNTER — Telehealth: Payer: Self-pay | Admitting: Cardiovascular Disease

## 2018-12-18 NOTE — Telephone Encounter (Signed)
New message  Patient is currently at PCP office. She is calling to report for 2 weeks she has felt dizzy and lightheaded.  STAT if patient feels like he/she is going to faint   1) Are you dizzy now?  Patient calling from PCP office  2) Do you feel faint or have you passed out? Has episodes where she feels faint  3) Do you have any other symptoms? lightheaded  4) Have you checked your HR and BP (record if available)? 118/83

## 2018-12-18 NOTE — Telephone Encounter (Signed)
Pt states she is currently at PCP now.  States they have done EKG, labs and getting ready to do CXR. Advised pt to continue with evaluation through PCP.  Advised to keep appointment, scheduled for next week, in our office to discuss this further.  Advised that if PCP feels this issue is cardiology related then they can call office and we can move her to sooner appointment day/time. Pt agreeable to plan.

## 2018-12-27 ENCOUNTER — Ambulatory Visit: Payer: Medicare HMO | Admitting: Physician Assistant

## 2018-12-31 ENCOUNTER — Other Ambulatory Visit: Payer: Self-pay

## 2019-01-02 ENCOUNTER — Encounter: Payer: Self-pay | Admitting: Endocrinology

## 2019-01-02 ENCOUNTER — Other Ambulatory Visit: Payer: Self-pay

## 2019-01-02 ENCOUNTER — Ambulatory Visit: Payer: Medicare HMO | Admitting: Endocrinology

## 2019-01-02 VITALS — BP 104/50 | HR 64 | Ht 61.5 in | Wt 149.0 lb

## 2019-01-02 DIAGNOSIS — E042 Nontoxic multinodular goiter: Secondary | ICD-10-CM | POA: Diagnosis not present

## 2019-01-02 DIAGNOSIS — E1151 Type 2 diabetes mellitus with diabetic peripheral angiopathy without gangrene: Secondary | ICD-10-CM | POA: Diagnosis not present

## 2019-01-02 LAB — POCT GLYCOSYLATED HEMOGLOBIN (HGB A1C): Hemoglobin A1C: 6.5 % — AB (ref 4.0–5.6)

## 2019-01-02 MED ORDER — LEVOTHYROXINE SODIUM 100 MCG PO TABS: 100.0000 ug | ORAL_TABLET | Freq: Every day | ORAL | 3 refills | Status: DC

## 2019-01-02 NOTE — Progress Notes (Signed)
Subjective:    Patient ID: [name], female    DOB: 12/07/1960, 58 y.o.   MRN: [id number]  HPI Pt has multinodular goiter (dx'ed 2014; it was found on [place], which had been done to investigate lymphadenopathy of the neck; the nodes appeared benign, but goiter was incidentally noted; bx was c/w benign follicular nodule; she was noted in early 2015 to have suppressed TSH, and she received RAI in April of 2015; in late 2015, she was noted to have elevated TSH, and was rx'ed synthroid; f/u US in 2019 was unchanged).   She does not notice any change in the nodule in the neck.  She has been back on synthroid x 1 week.   Pt also returns for f/u of diabetes mellitus:  DM type: 2 Dx'ed: 8250 Complications: polyneuropathy, PAD, and CAD.   Therapy: 4 oral meds.   GDM: never DKA: never Severe hypoglycemia: never Pancreatitis: never Other: She has never been on insulin; she did not tolerate invokana (nausea).   Interval history: Pt states cbg's are well-controlled.  pt states she feels well in general.   Past Medical History:  Diagnosis Date  . Allergy   . Anxiety   . Arthritis   . Back pain, chronic   . Bipolar disorder ([place])   . Bronchitis    hx-no inhalers at home  . Carotid bruit   . Carpal tunnel syndrome on both sides   . Cataract   . Coronary artery disease   . DDD (degenerative disc disease), lumbar   . Depression   . Diabetes mellitus    diet controlled  . Ejection fraction    .  Marland Kitchen GERD (gastroesophageal reflux disease)   . Hx of CABG   . Hypercholesteremia   . Hypertension   . Hypothyroidism   . Multiple thyroid nodules   . Myocardial infarction ([place]) 11/20/06  . Neuromuscular disorder ([place])   . Nodule of neck     Past Surgical History:  Procedure Laterality Date  . ABDOMINAL HYSTERECTOMY  1984  . BREAST EXCISIONAL BIOPSY Right    benign  . BREAST EXCISIONAL BIOPSY Left    benign  . BREAST SURGERY     rt br cystx2  . COLONOSCOPY    . CORONARY ARTERY  BYPASS GRAFT  2008  . KNEE SURGERY  2009   lt   . ORIF ANKLE FRACTURE Right 07/12/2012   Procedure: OPEN REDUCTION INTERNAL FIXATION (ORIF) RIGHT ANKLE FRACTURE;  Surgeon: Alta Corning, MD;  Location: [facility];  Service: Orthopedics;  Laterality: Right;  . SHOULDER SURGERY  11/08/2016  . TUBAL LIGATION    . WRIST SURGERY Left     Social History   Socioeconomic History  . Marital status: Married    Spouse name: Not on file  . Number of children: 2  . Years of education: Not on file  . Highest education level: 11th grade  Occupational History  . Not on file  Social Needs  . Financial resource strain: Not hard at all  . Food insecurity    Worry: Never true    Inability: Never true  . Transportation needs    Medical: No    Non-medical: No  Tobacco Use  . Smoking status: Current Every Day Smoker    Packs/day: 1.00    Years: 33.00    Pack years: 33.00    Types: Cigarettes  . Smokeless tobacco: Never Used  Substance and Sexual Activity  . Alcohol use: No  .  Drug use: No  . Sexual activity: Not on file  Lifestyle  . Physical activity    Days per week: 0 days    Minutes per session: 0 min  . Stress: To some extent  Relationships  . Social Herbalist on phone: Once a week    Gets together: Once a week    Attends religious service: Never    Active member of club or organization: No    Attends meetings of clubs or organizations: Never    Relationship status: Married  . Intimate partner violence    Fear of current or ex partner: No    Emotionally abused: No    Physically abused: No    Forced sexual activity: No  Other Topics Concern  . Not on file  Social History Narrative  . Not on file    Current Outpatient Medications on File Prior to Visit  Medication Sig Dispense Refill  . acarbose (PRECOSE) 50 MG tablet Take 1 tablet (50 mg total) by mouth 3 (three) times daily with meals. 270 tablet 3  . aspirin EC 81 MG tablet Take 1 tablet (81  mg total) by mouth daily.    . Bisacodyl (DULCOLAX PO) Take 1 tablet by mouth 2 (two) times daily.     . Blood Glucose Calibration (ACCU-CHEK GUIDE CONTROL) LIQD 1 each by Does not apply route as needed. Use to calibrate glucometer prn; E11.9 1 each 1  . carvedilol (COREG) 3.125 MG tablet TAKE 1 TABLET TWICE DAILY 180 tablet 1  . CYCLOSET 0.8 MG TABS TAKE 1 TABLET EVERY DAY 90 tablet 3  . diazepam (VALIUM) 5 MG tablet Take 1 tablet (5 mg total) by mouth every 12 (twelve) hours as needed for anxiety or muscle spasms. **NEED OFFICE VISIT FOR REFILL** 60 tablet 0  . DULoxetine (CYMBALTA) 60 MG capsule Take 60 mg by mouth 2 (two) times daily.    . empagliflozin (JARDIANCE) 25 MG TABS tablet Take 25 mg by mouth daily. 90 tablet 1  . ezetimibe (ZETIA) 10 MG tablet Take 1 tablet (10 mg total) by mouth daily. 90 tablet 3  . famotidine (PEPCID) 20 MG tablet Take 1 tablet (20 mg total) by mouth 2 (two) times daily. 180 tablet 0  . fenofibrate (TRICOR) 145 MG tablet Take 1 tablet (145 mg total) by mouth daily. 90 tablet 3  . fluticasone (FLONASE) 50 MCG/ACT nasal spray Place 1 spray into both nostrils 2 (two) times daily. 16 g 5  . glucose blood (ACCU-CHEK GUIDE) test strip Use to check blood sugar 3 times daily. 300 each 1  . Ipratropium-Albuterol (COMBIVENT RESPIMAT) 20-100 MCG/ACT AERS respimat Inhale 1 puff into the lungs every 6 (six) hours. 4 g 11  . Lancets Misc. (ACCU-CHEK FASTCLIX LANCET) KIT 1 each by Does not apply route 3 (three) times daily. Use to monitor glucose levels TID; E11.9 1 kit 0  . loratadine (CLARITIN) 10 MG tablet Take 1 tablet (10 mg total) by mouth daily. 90 tablet 3  . metFORMIN (GLUCOPHAGE) 1000 MG tablet Take 1 tablet (1,000 mg total) by mouth daily with breakfast. 90 tablet 3  . Methylnaltrexone Bromide (RELISTOR) 150 MG TABS Take 3 tablets by mouth daily. With water >30 min before first mea 270 tablet 0  . Na Sulfate-K Sulfate-Mg Sulf 17.5-3.13-1.6 GM/177ML SOLN Suprep as  directed, no substitutions 354 mL 0  . Omega-3 Fatty Acids (FISH OIL) 1000 MG CAPS Take by mouth.    Marland Kitchen omeprazole (PRILOSEC) 40  MG capsule TAKE 1 CAPSULE DAILY 30 MINUTES BEFORE A MEAL 90 capsule 3  . Oxycodone HCl 10 MG TABS Take 1 tablet by mouth every 4 (four) hours as needed.    . pregabalin (LYRICA) 100 MG capsule Take 1 capsule by mouth at bedtime.    . rosuvastatin (CRESTOR) 20 MG tablet Take 1 tablet (20 mg total) by mouth daily. 90 tablet 0  . tiZANidine (ZANAFLEX) 4 MG tablet Take 4 mg by mouth every 6 (six) hours as needed for muscle spasms.    . traZODone (DESYREL) 100 MG tablet Take 1 tablet (100 mg total) by mouth at bedtime as needed for sleep. 30 tablet 2   No current facility-administered medications on file prior to visit.     Allergies  Allergen Reactions  . Latuda [Lurasidone Hcl] Shortness Of Breath  . Coconut Flavor Swelling and Rash  . Ibuprofen Swelling and Rash  . Prednisone Swelling and Rash  . Cyclobenzaprine     "almost made heart stop, couldn't breathe"  . Meloxicam Itching  . Morphine Nausea And Vomiting    Family History  Problem Relation Age of Onset  . Heart disease Mother   . Hyperlipidemia Mother   . Hypertension Mother   . Heart disease Father   . Stroke Father   . Hypertension Father   . Hyperlipidemia Father   . Hypertension Sister   . Hypertension Brother   . Cancer Daughter        5 different kinds of cancer  . Hypertension Maternal Grandmother   . Hypertension Maternal Grandfather   . Cancer Maternal Grandfather   . Hypertension Paternal Grandmother   . Hypertension Paternal Grandfather   . Cancer Maternal Aunt        breast and ovarian  . Cancer Paternal Aunt        breast  . Colon cancer Neg Hx   . Esophageal cancer Neg Hx   . Rectal cancer Neg Hx   . Stomach cancer Neg Hx   . Liver cancer Neg Hx     BP (!) 104/50 (BP Location: Right Arm, Patient Position: Sitting, Cuff Size: Normal)   Pulse 64   Ht 5' 1.5" (1.562 m)    Wt 149 lb (67.6 kg)   SpO2 97%   BMI 27.70 kg/m   Review of Systems She has gained a few lbs    Objective:   Physical Exam VITAL SIGNS:  See vs page GENERAL: no distress NECK: right sided thyroid nodule is again noted.  approx 2 cm diameter.  Freely mobile.   Pulses: dorsalis pedis intact bilat.   MSK: no deformity of the feet CV: no leg edema Skin:  no ulcer on the feet.  normal color and temp on the feet.   Neuro: sensation is intact to touch on the feet.     Lab Results  Component Value Date   HGBA1C 6.5 (A) 01/02/2019   Lab Results  Component Value Date   TSH 2.560 06/24/2018   Lab Results  Component Value Date   CREATININE 0.82 10/15/2018   BUN 19 10/15/2018   NA 143 10/15/2018   K 4.2 10/15/2018   CL 108 (H) 10/15/2018   CO2 21 10/15/2018        Assessment & Plan:  MNG: we discussed low risk. We can wait another year to recheck the ultrasound Type 2 DM, well-controlled  Patient Instructions  Please continue the same medications.   Please redo the the thyroid blood test  in 1 month. Please come back for a follow-up appointment in 6 months.

## 2019-01-02 NOTE — Patient Instructions (Addendum)
Please continue the same medications.   Please redo the the thyroid blood test in 1 month. Please come back for a follow-up appointment in 6 months.

## 2019-01-22 MED ORDER — FENOFIBRATE 145 MG PO TABS: 145.0000 mg | ORAL_TABLET | Freq: Every day | ORAL | 3 refills | Status: DC

## 2019-01-22 NOTE — Telephone Encounter (Signed)
Placed order for refill as patient requested.

## 2019-01-29 ENCOUNTER — Encounter: Payer: Self-pay | Admitting: Family Medicine

## 2019-04-28 ENCOUNTER — Other Ambulatory Visit: Payer: Self-pay

## 2019-04-28 ENCOUNTER — Other Ambulatory Visit: Payer: Self-pay | Admitting: Family Medicine

## 2019-04-28 DIAGNOSIS — I10 Essential (primary) hypertension: Secondary | ICD-10-CM

## 2019-04-28 NOTE — Telephone Encounter (Signed)
Pt needs appt for nx refill

## 2019-04-28 NOTE — Telephone Encounter (Signed)
Called pt to schedule appt for this refill pt is going to see her [facility] tomorrow will get her refill there  FR

## 2019-04-28 NOTE — Telephone Encounter (Signed)
Requested medication (s) are due for refill today: yes  Requested medication (s) are on the active medication list: yes  Last refill:  01/10/2019  Future visit scheduled: no  Notes to clinic:  no valid encounter within last 6 month   Requested Prescriptions  Pending Prescriptions Disp Refills   carvedilol (COREG) 3.125 MG tablet [Pharmacy Med Name: CARVEDILOL 3.125 MG Tablet] 180 tablet 1    Sig: TAKE 1 TABLET TWICE DAILY      Cardiovascular:  Beta Blockers Failed - 04/28/2019 12:26 PM      Failed - Valid encounter within last 6 months    Recent Outpatient Visits           6 months ago Rash due to allergy   [facility], Ines Bloomer, MD   7 months ago Seasonal allergies   [facility], Lilia Argue, MD   10 months ago Women's annual routine gynecological examination   [facility], Lilia Argue, MD   10 months ago Medicare annual wellness visit, subsequent   [facility], Lilia Argue, MD   11 months ago Rhinosinusitis   [facility], Lilia Argue, MD              Passed - Last BP in normal range    BP Readings from Last 1 Encounters:  01/02/19 (!) 104/50          Passed - Last Heart Rate in normal range    Pulse Readings from Last 1 Encounters:  01/02/19 64

## 2019-04-30 ENCOUNTER — Ambulatory Visit
Admission: RE | Admit: 2019-04-30 | Discharge: 2019-04-30 | Disposition: A | Discharge status: Home / Self Care | Payer: Medicare HMO | Source: Ambulatory Visit | Attending: Acute Care | Admitting: Acute Care

## 2019-04-30 DIAGNOSIS — Z87891 Personal history of nicotine dependence: Secondary | ICD-10-CM

## 2019-04-30 DIAGNOSIS — Z122 Encounter for screening for malignant neoplasm of respiratory organs: Secondary | ICD-10-CM

## 2019-04-30 DIAGNOSIS — F17210 Nicotine dependence, cigarettes, uncomplicated: Secondary | ICD-10-CM

## 2019-05-05 ENCOUNTER — Other Ambulatory Visit: Payer: Self-pay | Admitting: *Deleted

## 2019-05-05 DIAGNOSIS — Z87891 Personal history of nicotine dependence: Secondary | ICD-10-CM

## 2019-05-05 DIAGNOSIS — F17210 Nicotine dependence, cigarettes, uncomplicated: Secondary | ICD-10-CM

## 2019-06-10 ENCOUNTER — Emergency Department
Admission: EM | Admit: 2019-06-10 | Discharge: 2019-06-10 | Disposition: A | Discharge status: Home / Self Care | Payer: Medicare Other | Attending: Emergency Medicine | Admitting: Emergency Medicine

## 2019-06-10 ENCOUNTER — Encounter: Payer: Self-pay | Admitting: Emergency Medicine

## 2019-06-10 ENCOUNTER — Emergency Department: Payer: Medicare Other

## 2019-06-10 ENCOUNTER — Other Ambulatory Visit: Payer: Self-pay

## 2019-06-10 DIAGNOSIS — S0101XA Laceration without foreign body of scalp, initial encounter: Secondary | ICD-10-CM | POA: Insufficient documentation

## 2019-06-10 DIAGNOSIS — Z7982 Long term (current) use of aspirin: Secondary | ICD-10-CM | POA: Diagnosis not present

## 2019-06-10 DIAGNOSIS — W208XXA Other cause of strike by thrown, projected or falling object, initial encounter: Secondary | ICD-10-CM | POA: Insufficient documentation

## 2019-06-10 DIAGNOSIS — Z951 Presence of aortocoronary bypass graft: Secondary | ICD-10-CM | POA: Diagnosis not present

## 2019-06-10 DIAGNOSIS — Z79899 Other long term (current) drug therapy: Secondary | ICD-10-CM | POA: Insufficient documentation

## 2019-06-10 DIAGNOSIS — E119 Type 2 diabetes mellitus without complications: Secondary | ICD-10-CM | POA: Diagnosis not present

## 2019-06-10 DIAGNOSIS — Y929 Unspecified place or not applicable: Secondary | ICD-10-CM | POA: Insufficient documentation

## 2019-06-10 DIAGNOSIS — Y999 Unspecified external cause status: Secondary | ICD-10-CM | POA: Insufficient documentation

## 2019-06-10 DIAGNOSIS — Y9389 Activity, other specified: Secondary | ICD-10-CM | POA: Diagnosis not present

## 2019-06-10 DIAGNOSIS — Z7984 Long term (current) use of oral hypoglycemic drugs: Secondary | ICD-10-CM | POA: Diagnosis not present

## 2019-06-10 DIAGNOSIS — F17210 Nicotine dependence, cigarettes, uncomplicated: Secondary | ICD-10-CM | POA: Diagnosis not present

## 2019-06-10 DIAGNOSIS — I2510 Atherosclerotic heart disease of native coronary artery without angina pectoris: Secondary | ICD-10-CM | POA: Diagnosis not present

## 2019-06-10 DIAGNOSIS — S0990XA Unspecified injury of head, initial encounter: Secondary | ICD-10-CM

## 2019-06-10 DIAGNOSIS — Z23 Encounter for immunization: Secondary | ICD-10-CM | POA: Diagnosis not present

## 2019-06-10 MED ORDER — LIDOCAINE HCL (PF) 1 % IJ SOLN
5.0000 mL | Freq: Once | INTRAMUSCULAR | Status: AC
  Administered 2019-06-10: 18:00:00 5 mL via INTRADERMAL
  Filled 2019-06-10: qty 5

## 2019-06-10 MED ORDER — TETANUS-DIPHTH-ACELL PERTUSSIS 5-2.5-18.5 LF-MCG/0.5 IM SUSP
0.5000 mL | Freq: Once | INTRAMUSCULAR | Status: AC
  Administered 2019-06-10: 18:00:00 0.5 mL via INTRAMUSCULAR
  Filled 2019-06-10: qty 0.5

## 2019-06-10 NOTE — ED Triage Notes (Signed)
FIRST NURSE NOTE-reports part of machine that grinds sausage fell on head. No LOC. Pt reports kept telling her son "help me" after it happened.  Ambulatory without difficulty.

## 2019-06-10 NOTE — ED Provider Notes (Signed)
[facility] Emergency Department Provider Note  ____________________________________________  Time seen: Approximately 5:22 PM  I have reviewed the triage vital signs and the nursing notes.   HISTORY  Chief Complaint Head Injury    HPI [name] is a 59 y.o. female that presents to the emergency department for evaluation of head injury.  Patient states that she was putting up a cabinet and forgot about the shelf that was above her head.  A meat grinder fell off of the shelf and hit the top of her head.  She did not lose consciousness.  She felt a little dazed just after.  She takes 2 baby aspirin daily.  She currently has a headache.  No current dizziness, confusion, visual changes.  Past Medical History:  Diagnosis Date  . Allergy   . Anxiety   . Arthritis   . Back pain, chronic   . Bipolar disorder ([place])   . Bronchitis    hx-no inhalers at home  . Carotid bruit   . Carpal tunnel syndrome on both sides   . Cataract   . Coronary artery disease   . DDD (degenerative disc disease), lumbar   . Depression   . Diabetes mellitus    diet controlled  . Ejection fraction    .  Marland Kitchen GERD (gastroesophageal reflux disease)   . Hx of CABG   . Hypercholesteremia   . Hypertension   . Hypothyroidism   . Multiple thyroid nodules   . Myocardial infarction ([place]) 11/20/06  . Neuromuscular disorder ([place])   . Nodule of neck     Patient Active Problem List   Diagnosis Date Noted  . S/P hysterectomy 06/24/2018  . History of colonic polyps 06/24/2018  . Polycythemia, secondary 05/13/2017  . Diabetes ([place]) 07/29/2015  . History of myocardial infarction 12/09/2014  . Acquired hypothyroidism 10/09/2014  . Benign essential HTN 10/09/2014  . Bipolar affective disorder, depressed, moderate ([place]) 10/09/2014  . Chronic pain syndrome 10/09/2014  . Coccyx pain 10/09/2014  . Recurrent falls 10/09/2014  . 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/03/2014   . Multinodular goiter 09/15/2013  . Carotid bruit   . Depression   . Coronary artery disease   . Hyperlipidemia   . Neuromuscular disorder ([place])   . Arthritis   . DDD (degenerative disc disease), lumbar   . Hx of CABG   . Spinal stenosis of lumbar region 02/08/2012  . Neuropathy 02/08/2012  . Back pain, chronic   . TOBACCO ABUSE 11/20/2008    Past Surgical History:  Procedure Laterality Date  . ABDOMINAL HYSTERECTOMY  1984  . BREAST EXCISIONAL BIOPSY Right    benign  . BREAST EXCISIONAL BIOPSY Left    benign  . BREAST SURGERY     rt br cystx2  . COLONOSCOPY    . CORONARY ARTERY BYPASS GRAFT  2008  . KNEE SURGERY  2009   lt   . ORIF ANKLE FRACTURE Right 07/12/2012   Procedure: OPEN REDUCTION INTERNAL FIXATION (ORIF) RIGHT ANKLE FRACTURE;  Surgeon: Alta Corning, MD;  Location: [place];  Service: Orthopedics;  Laterality: Right;  . SHOULDER SURGERY  11/08/2016  . TUBAL LIGATION    . WRIST SURGERY Left     Prior to Admission medications   Medication Sig Start Date End Date Taking? Authorizing Provider  acarbose (PRECOSE) 50 MG tablet Take 1 tablet (50 mg total) by mouth 3 (three) times daily with meals. 07/04/18   Renato Shin, MD  aspirin EC 81  MG tablet Take 1 tablet (81 mg total) by mouth daily. 07/09/18   Nahser, Wonda Cheng, MD  Bisacodyl (DULCOLAX PO) Take 1 tablet by mouth 2 (two) times daily.     [provider]  Blood Glucose Calibration (ACCU-CHEK GUIDE CONTROL) LIQD 1 each by Does not apply route as needed. Use to calibrate glucometer prn; E11.9 07/19/18   Renato Shin, MD  carvedilol (COREG) 3.125 MG tablet TAKE 1 TABLET TWICE DAILY 04/28/19   Rutherford Guys, MD  CYCLOSET 0.8 MG TABS TAKE 1 TABLET EVERY DAY 06/03/18   Renato Shin, MD  diazepam (VALIUM) 5 MG tablet Take 1 tablet (5 mg total) by mouth every 12 (twelve) hours as needed for anxiety or muscle spasms. **NEED OFFICE VISIT FOR REFILL** 02/23/18   Shawnee Knapp, MD  DULoxetine  (CYMBALTA) 60 MG capsule Take 60 mg by mouth 2 (two) times daily.    [provider]  empagliflozin (JARDIANCE) 25 MG TABS tablet Take 25 mg by mouth daily. 09/30/18   Renato Shin, MD  ezetimibe (ZETIA) 10 MG tablet Take 1 tablet (10 mg total) by mouth daily. 07/15/18 07/10/19  Nahser, Wonda Cheng, MD  famotidine (PEPCID) 20 MG tablet Take 1 tablet (20 mg total) by mouth 2 (two) times daily. 11/24/18   Rutherford Guys, MD  fenofibrate (TRICOR) 145 MG tablet Take 1 tablet (145 mg total) by mouth daily. 01/22/19   Nahser, Wonda Cheng, MD  fluticasone (FLONASE) 50 MCG/ACT nasal spray Place 1 spray into both nostrils 2 (two) times daily. 06/24/18   Rutherford Guys, MD  glucose blood (ACCU-CHEK GUIDE) test strip Use to check blood sugar 3 times daily. 07/22/18   Renato Shin, MD  Ipratropium-Albuterol (COMBIVENT RESPIMAT) 20-100 MCG/ACT AERS respimat Inhale 1 puff into the lungs every 6 (six) hours. 05/10/17   Shawnee Knapp, MD  Lancets Misc. (ACCU-CHEK FASTCLIX LANCET) KIT 1 each by Does not apply route 3 (three) times daily. Use to monitor glucose levels TID; E11.9 07/19/18   Renato Shin, MD  levothyroxine (SYNTHROID) 100 MCG tablet Take 1 tablet (100 mcg total) by mouth daily before breakfast. 01/02/19   Renato Shin, MD  loratadine (CLARITIN) 10 MG tablet Take 1 tablet (10 mg total) by mouth daily. 09/25/18   Rutherford Guys, MD  metFORMIN (GLUCOPHAGE) 1000 MG tablet Take 1 tablet (1,000 mg total) by mouth daily with breakfast. 09/30/18   Renato Shin, MD  Methylnaltrexone Bromide (RELISTOR) 150 MG TABS Take 3 tablets by mouth daily. With water >30 min before first mea 11/11/18   Rutherford Guys, MD  Na Sulfate-K Sulfate-Mg Sulf 17.5-3.13-1.6 GM/177ML SOLN Suprep as directed, no substitutions 09/20/18   Nandigam, Venia Minks, MD  Omega-3 Fatty Acids (FISH OIL) 1000 MG CAPS Take by mouth.    [provider]  omeprazole (PRILOSEC) 40 MG capsule TAKE 1 CAPSULE DAILY 30 MINUTES BEFORE A MEAL 09/16/18    Rutherford Guys, MD  Oxycodone HCl 10 MG TABS Take 1 tablet by mouth every 4 (four) hours as needed. 07/04/18   [provider]  pregabalin (LYRICA) 100 MG capsule Take 1 capsule by mouth at bedtime. 07/04/18   [provider]  rosuvastatin (CRESTOR) 20 MG tablet Take 1 tablet (20 mg total) by mouth daily. 11/26/18   Rutherford Guys, MD  tiZANidine (ZANAFLEX) 4 MG tablet Take 4 mg by mouth every 6 (six) hours as needed for muscle spasms.    [provider]  traZODone (DESYREL) 100 MG  tablet Take 1 tablet (100 mg total) by mouth at bedtime as needed for sleep. 11/24/16   Shawnee Knapp, MD    Allergies Doran Clay hcl], Coconut flavor, Ibuprofen, Prednisone, Cyclobenzaprine, Meloxicam, and Morphine  Family History  Problem Relation Age of Onset  . Heart disease Mother   . Hyperlipidemia Mother   . Hypertension Mother   . Heart disease Father   . Stroke Father   . Hypertension Father   . Hyperlipidemia Father   . Hypertension Sister   . Hypertension Brother   . Cancer Daughter        5 different kinds of cancer  . Hypertension Maternal Grandmother   . Hypertension Maternal Grandfather   . Cancer Maternal Grandfather   . Hypertension Paternal Grandmother   . Hypertension Paternal Grandfather   . Cancer Maternal Aunt        breast and ovarian  . Cancer Paternal Aunt        breast  . Colon cancer Neg Hx   . Esophageal cancer Neg Hx   . Rectal cancer Neg Hx   . Stomach cancer Neg Hx   . Liver cancer Neg Hx     Social History Social History   Tobacco Use  . Smoking status: Current Every Day Smoker    Packs/day: 1.00    Years: 33.00    Pack years: 33.00    Types: Cigarettes  . Smokeless tobacco: Never Used  Substance Use Topics  . Alcohol use: No  . Drug use: No     Review of Systems  Cardiovascular: No chest pain. Respiratory: No cough. No SOB. Gastrointestinal: No abdominal pain.  No nausea, no vomiting.  Genitourinary: Negative for  dysuria. Musculoskeletal: Negative for musculoskeletal pain. Skin: Negative for rash, abrasions, ecchymosis. Positive for laceration. Neurological: Negative for numbness or tingling or weakness. Positive for headache.   ____________________________________________   PHYSICAL EXAM:  VITAL SIGNS: ED Triage Vitals  Enc Vitals Group     BP 06/10/19 1601 131/60     Pulse Rate 06/10/19 1601 72     Resp 06/10/19 1601 16     Temp 06/10/19 1601 98.4 F (36.9 C)     Temp Source 06/10/19 1601 Oral     SpO2 06/10/19 1601 95 %     Weight 06/10/19 1602 152 lb (68.9 kg)     Height 06/10/19 1602 5' 1"  (1.549 m)     Head Circumference --      Peak Flow --      Pain Score 06/10/19 1602 10     Pain Loc --      Pain Edu? --      Excl. in [place]? --      Constitutional: Alert and oriented. Well appearing and in no acute distress. Eyes: Conjunctivae are normal. PERRL. EOMI. Head: 1 cm laceration to frontal scalp. ENT:      Ears:      Nose: No congestion/rhinnorhea.      Mouth/Throat: Mucous membranes are moist.  Neck: No stridor.  No cervical spine tenderness to palpation. Cardiovascular: Normal rate, regular rhythm.  Good peripheral circulation. Respiratory: Normal respiratory effort without tachypnea or retractions. Lungs CTAB. Good air entry to the bases with no decreased or absent breath sounds. Gastrointestinal: Bowel sounds 4 quadrants. Soft and nontender to palpation. No guarding or rigidity. No palpable masses. No distention. Musculoskeletal: Full range of motion to all extremities. No gross deformities appreciated. Neurologic:  Normal speech and language. No gross focal neurologic deficits  are appreciated.  Skin:  Skin is warm, dry and intact. No rash noted. Psychiatric: Mood and affect are normal. Speech and behavior are normal. Patient exhibits appropriate insight and judgement.   ____________________________________________   LABS (all labs ordered are listed, but only abnormal  results are displayed)  Labs Reviewed - No data to display ____________________________________________  EKG   ____________________________________________  RADIOLOGY Robinette Haines, personally viewed and evaluated these images (plain radiographs) as part of my medical decision making, as well as reviewing the written report by the radiologist.  CT Head Wo Contrast  Result Date: 06/10/2019 CLINICAL DATA:  Metal object fell on head.  Headache. EXAM: CT HEAD WITHOUT CONTRAST TECHNIQUE: Contiguous axial images were obtained from the base of the skull through the vertex without intravenous contrast. COMPARISON:  August 19, 2011. FINDINGS: Brain: Ventricles and sulci are normal in size and configuration. There is no intracranial mass, hemorrhage, extra-axial fluid collection, or midline shift. Brain parenchyma appears unremarkable. No evident acute infarct. Vascular: No hyperdense vessel. There is calcification in each carotid siphon region. Skull: The bony calvarium appears intact. Sinuses/Orbits: There is an apparent retention cyst in inferior left frontal sinus. There is mucosal thickening in several ethmoid air cells. Visualized orbits appear symmetric bilaterally. Other: Visualized mastoid air cells are clear. IMPRESSION: 1. Brain parenchyma appears unremarkable. No mass or hemorrhage. No extra-axial fluid collection. 2.  There are foci of arterial vascular calcification. 3.  There are foci of paranasal sinus disease. Electronically Signed   By: Lowella Grip III M.D.   On: 06/10/2019 16:22    ____________________________________________    PROCEDURES  Procedure(s) performed:    Procedures  LACERATION REPAIR Performed by: Laban Emperor  Consent: Verbal consent obtained.  Consent given by: patient  Prepped and Draped in normal sterile fashion  Wound explored: No foreign bodies   Laceration Location: head  Laceration Length: 2cm  Anesthesia: None  Local anesthetic:  lidocaine 1% without epinephrine  Anesthetic total: 4  ml  Irrigation method: syringe  Amount of cleaning: 524m normal saline  Skin closure: staples  Number of staples: 3  Technique: Simple interrupted  Patient tolerance: Patient tolerated the procedure well with no immediate complications.  Medications  lidocaine (PF) (XYLOCAINE) 1 % injection 5 mL (5 mLs Intradermal Given 06/10/19 1755)  Tdap (BOOSTRIX) injection 0.5 mL (0.5 mLs Intramuscular Given 06/10/19 1755)     ____________________________________________   INITIAL IMPRESSION / ASSESSMENT AND PLAN / ED COURSE  Pertinent labs & imaging results that were available during my care of the patient were reviewed by me and considered in my medical decision making (see chart for details).  Review of the [place] CSRS was performed in accordance of the [place] to [place] any controlled drugs.   Patient presented to emergency department for evaluation of head injury.  Vital signs and exam are reassuring.  CT head is negative for acute abnormalities.  Staples were placed to laceration.  Patient is to follow up with primary care as directed. Patient is given ED precautions to return to the ED for any worsening or new symptoms.   PRayah FinesLopez-Valencia was evaluated in Emergency Department on 06/10/2019 for the symptoms described in the history of present illness. She was evaluated in the context of the global COVID-19 pandemic, which necessitated consideration that the patient might be at risk for infection with the SARS-CoV-2 virus that causes COVID-19. Institutional protocols and algorithms that pertain to the evaluation of patients at risk for COVID-19 are in  a state of rapid change based on information released by regulatory bodies including the CDC and federal and state organizations. These policies and algorithms were followed during the patient's care in the ED.  ____________________________________________  FINAL CLINICAL  IMPRESSION(S) / ED DIAGNOSES  Final diagnoses:  Injury of head, initial encounter      NEW MEDICATIONS STARTED DURING THIS VISIT:  ED Discharge Orders    None          This chart was dictated using voice recognition software/Dragon. Despite best efforts to proofread, errors can occur which can change the meaning. Any change was purely unintentional.    Laban Emperor, PA-C 06/10/19 2113    Earleen Newport, MD 06/17/19 1309

## 2019-06-10 NOTE — ED Triage Notes (Signed)
Patient reports she was hit in head with a piece of metal at home. States she did not lose consciousness, however, she did have some dizziness afterwards. Patient states she was sent by PCP for further evaluation. Patient with laceration noted to top of head, bleeding controlled. States she takes 2 baby aspirin daily.

## 2019-06-10 NOTE — ED Notes (Signed)
Pt left unintentionally before signing discharge papers. Pt ambulatory with a steady gait.

## 2019-06-17 ENCOUNTER — Other Ambulatory Visit: Payer: Self-pay

## 2019-06-17 ENCOUNTER — Telehealth: Payer: Self-pay | Admitting: Endocrinology

## 2019-06-17 DIAGNOSIS — E1151 Type 2 diabetes mellitus with diabetic peripheral angiopathy without gangrene: Secondary | ICD-10-CM

## 2019-06-17 MED ORDER — EMPAGLIFLOZIN 25 MG PO TABS: 25.0000 mg | ORAL_TABLET | Freq: Every day | ORAL | 1 refills | Status: DC

## 2019-06-17 NOTE — Telephone Encounter (Signed)
Outpatient Medication Detail   Disp Refills Start End   empagliflozin (JARDIANCE) 25 MG TABS tablet 90 tablet 1 06/17/2019    Sig - Route: Take 25 mg by mouth daily. - Oral   Sent to pharmacy as: empagliflozin (JARDIANCE) 25 MG Tab tablet   E-Prescribing Status: Receipt confirmed by pharmacy (06/17/2019  8:45 AM EST)

## 2019-06-17 NOTE — Telephone Encounter (Signed)
MEDICATION: empagliflozin (JARDIANCE) 25 MG TABS tablet  PHARMACY:  [facility] in [place]  IS THIS A 90 DAY SUPPLY : If possible  IS PATIENT OUT OF MEDICATION: No  IF NOT; HOW MUCH IS LEFT: 4-5 days  LAST APPOINTMENT DATE: @9 /06/2018  NEXT APPOINTMENT DATE:@3 /07/2019  DO WE HAVE YOUR PERMISSION TO LEAVE A DETAILED MESSAGE: Yes  OTHER COMMENTS:    **Let patient know to contact pharmacy at the end of the day to make sure medication is ready. **  ** Please notify patient to allow 48-72 hours to process**  **Encourage patient to contact the pharmacy for refills or they can request refills through [facility]**

## 2019-06-18 ENCOUNTER — Other Ambulatory Visit: Payer: Self-pay | Admitting: Orthopedic Surgery

## 2019-06-18 DIAGNOSIS — M5416 Radiculopathy, lumbar region: Secondary | ICD-10-CM

## 2019-06-18 DIAGNOSIS — M533 Sacrococcygeal disorders, not elsewhere classified: Secondary | ICD-10-CM

## 2019-06-24 ENCOUNTER — Other Ambulatory Visit: Payer: Self-pay

## 2019-06-24 ENCOUNTER — Ambulatory Visit
Admission: RE | Admit: 2019-06-24 | Discharge: 2019-06-24 | Disposition: A | Discharge status: Home / Self Care | Payer: Medicare Other | Source: Ambulatory Visit | Attending: Orthopedic Surgery | Admitting: Orthopedic Surgery

## 2019-06-24 DIAGNOSIS — M533 Sacrococcygeal disorders, not elsewhere classified: Secondary | ICD-10-CM

## 2019-06-24 MED ORDER — METHYLPREDNISOLONE ACETATE 40 MG/ML INJ SUSP (RADIOLOG
120.0000 mg | Freq: Once | INTRAMUSCULAR | Status: AC
  Administered 2019-06-24: 120 mg via INTRA_ARTICULAR

## 2019-07-01 ENCOUNTER — Other Ambulatory Visit: Payer: Self-pay

## 2019-07-03 ENCOUNTER — Ambulatory Visit (INDEPENDENT_AMBULATORY_CARE_PROVIDER_SITE_OTHER): Payer: Medicare Other | Admitting: Endocrinology

## 2019-07-03 ENCOUNTER — Other Ambulatory Visit: Payer: Self-pay

## 2019-07-03 ENCOUNTER — Ambulatory Visit
Admission: RE | Admit: 2019-07-03 | Discharge: 2019-07-03 | Disposition: A | Discharge status: Home / Self Care | Payer: Medicare Other | Source: Ambulatory Visit | Attending: Orthopedic Surgery | Admitting: Orthopedic Surgery

## 2019-07-03 ENCOUNTER — Encounter: Payer: Self-pay | Admitting: Endocrinology

## 2019-07-03 VITALS — BP 124/60 | HR 69 | Ht 61.0 in | Wt 155.4 lb

## 2019-07-03 DIAGNOSIS — E1151 Type 2 diabetes mellitus with diabetic peripheral angiopathy without gangrene: Secondary | ICD-10-CM

## 2019-07-03 DIAGNOSIS — M5416 Radiculopathy, lumbar region: Secondary | ICD-10-CM

## 2019-07-03 DIAGNOSIS — E042 Nontoxic multinodular goiter: Secondary | ICD-10-CM | POA: Diagnosis not present

## 2019-07-03 LAB — BASIC METABOLIC PANEL
BUN: 20 mg/dL (ref 6–23)
CO2: 27 mEq/L (ref 19–32)
Calcium: 9.4 mg/dL (ref 8.4–10.5)
Chloride: 107 mEq/L (ref 96–112)
Creatinine, Ser: 0.9 mg/dL (ref 0.40–1.20)
GFR: 64.32 mL/min (ref >60.00)
Glucose, Bld: 180 mg/dL — ABNORMAL HIGH (ref 70–99)
Potassium: 4 mEq/L (ref 3.5–5.1)
Sodium: 140 mEq/L (ref 135–145)

## 2019-07-03 LAB — TSH: TSH: 1.02 u[IU]/mL (ref 0.35–4.50)

## 2019-07-03 LAB — POCT GLYCOSYLATED HEMOGLOBIN (HGB A1C): Hemoglobin A1C: 7.3 % — AB (ref 4.0–5.6)

## 2019-07-03 MED ORDER — METFORMIN HCL 1000 MG PO TABS: 1000.0000 mg | ORAL_TABLET | Freq: Two times a day (BID) | ORAL | 3 refills | Status: DC

## 2019-07-03 NOTE — Progress Notes (Signed)
Subjective:    Patient ID: [name], female    DOB: Oct 28, 1960, 59 y.o.   MRN: [id number]  HPI Pt has multinodular goiter (dx'ed 2014; it was found on [place], which had been done to investigate lymphadenopathy of the neck; the nodes appeared benign, but goiter was incidentally noted; bx was c/w benign follicular nodule; she was noted in early 2015 to have suppressed TSH, and she received RAI in April of 2015; in late 2015, she was noted to have elevated TSH, and was rx'ed synthroid; f/u US in 2019 was unchanged).   She does not notice any change in the nodule in the neck.  She takes synthroid as rx'ed Pt also returns for f/u of diabetes mellitus:  DM type: 2 Dx'ed: 2595 Complications: polyneuropathy, PAD, and CAD.   Therapy: 4 oral meds.   GDM: never DKA: never Severe hypoglycemia: never Pancreatitis: never Other: She has never been on insulin; she did not tolerate invokana (nausea).   Interval history: Pt states cbg's are in the 100's.  pt states she feels well in general.  She takes meds as rx'ed Past Medical History:  Diagnosis Date  . Allergy   . Anxiety   . Arthritis   . Back pain, chronic   . Bipolar disorder ([place])   . Bronchitis    hx-no inhalers at home  . Carotid bruit   . Carpal tunnel syndrome on both sides   . Cataract   . Coronary artery disease   . DDD (degenerative disc disease), lumbar   . Depression   . Diabetes mellitus    diet controlled  . Ejection fraction    .  Marland Kitchen GERD (gastroesophageal reflux disease)   . Hx of CABG   . Hypercholesteremia   . Hypertension   . Hypothyroidism   . Multiple thyroid nodules   . Myocardial infarction ([place]) 11/20/06  . Neuromuscular disorder ([place])   . Nodule of neck     Past Surgical History:  Procedure Laterality Date  . ABDOMINAL HYSTERECTOMY  1984  . BREAST EXCISIONAL BIOPSY Right    benign  . BREAST EXCISIONAL BIOPSY Left    benign  . BREAST SURGERY     rt br cystx2  . COLONOSCOPY    . CORONARY  ARTERY BYPASS GRAFT  2008  . KNEE SURGERY  2009   lt   . ORIF ANKLE FRACTURE Right 07/12/2012   Procedure: OPEN REDUCTION INTERNAL FIXATION (ORIF) RIGHT ANKLE FRACTURE;  Surgeon: Alta Corning, MD;  Location: [facility];  Service: Orthopedics;  Laterality: Right;  . SHOULDER SURGERY  11/08/2016  . TUBAL LIGATION    . WRIST SURGERY Left     Social History   Socioeconomic History  . Marital status: Married    Spouse name: Not on file  . Number of children: 2  . Years of education: Not on file  . Highest education level: 11th grade  Occupational History  . Not on file  Tobacco Use  . Smoking status: Current Every Day Smoker    Packs/day: 1.00    Years: 33.00    Pack years: 33.00    Types: Cigarettes  . Smokeless tobacco: Never Used  Substance and Sexual Activity  . Alcohol use: No  . Drug use: No  . Sexual activity: Not on file  Other Topics Concern  . Not on file  Social History Narrative  . Not on file   Social Determinants of Health   Financial Resource Strain:   .  Difficulty of Paying Living Expenses: Not on file  Food Insecurity:   . Worried About Charity fundraiser in the Last Year: Not on file  . Ran Out of Food in the Last Year: Not on file  Transportation Needs:   . Lack of Transportation (Medical): Not on file  . Lack of Transportation (Non-Medical): Not on file  Physical Activity:   . Days of Exercise per Week: Not on file  . Minutes of Exercise per Session: Not on file  Stress:   . Feeling of Stress : Not on file  Social Connections:   . Frequency of Communication with Friends and Family: Not on file  . Frequency of Social Gatherings with Friends and Family: Not on file  . Attends Religious Services: Not on file  . Active Member of Clubs or Organizations: Not on file  . Attends Archivist Meetings: Not on file  . Marital Status: Not on file  Intimate Partner Violence:   . Fear of Current or Ex-Partner: Not on file  .  Emotionally Abused: Not on file  . Physically Abused: Not on file  . Sexually Abused: Not on file    Current Outpatient Medications on File Prior to Visit  Medication Sig Dispense Refill  . acarbose (PRECOSE) 50 MG tablet Take 1 tablet (50 mg total) by mouth 3 (three) times daily with meals. 270 tablet 3  . aspirin EC 81 MG tablet Take 1 tablet (81 mg total) by mouth daily.    . Bisacodyl (DULCOLAX PO) Take 1 tablet by mouth 2 (two) times daily.     . Blood Glucose Calibration (ACCU-CHEK GUIDE CONTROL) LIQD 1 each by Does not apply route as needed. Use to calibrate glucometer prn; E11.9 1 each 1  . carvedilol (COREG) 3.125 MG tablet TAKE 1 TABLET TWICE DAILY 60 tablet 0  . CYCLOSET 0.8 MG TABS TAKE 1 TABLET EVERY DAY 90 tablet 3  . diazepam (VALIUM) 5 MG tablet Take 1 tablet (5 mg total) by mouth every 12 (twelve) hours as needed for anxiety or muscle spasms. **NEED OFFICE VISIT FOR REFILL** 60 tablet 0  . DULoxetine (CYMBALTA) 60 MG capsule Take 60 mg by mouth 2 (two) times daily.    . empagliflozin (JARDIANCE) 25 MG TABS tablet Take 25 mg by mouth daily. 90 tablet 1  . ezetimibe (ZETIA) 10 MG tablet Take 1 tablet (10 mg total) by mouth daily. 90 tablet 3  . famotidine (PEPCID) 20 MG tablet Take 1 tablet (20 mg total) by mouth 2 (two) times daily. 180 tablet 0  . fenofibrate (TRICOR) 145 MG tablet Take 1 tablet (145 mg total) by mouth daily. 90 tablet 3  . fluticasone (FLONASE) 50 MCG/ACT nasal spray Place 1 spray into both nostrils 2 (two) times daily. 16 g 5  . glucose blood (ACCU-CHEK GUIDE) test strip Use to check blood sugar 3 times daily. 300 each 1  . Ipratropium-Albuterol (COMBIVENT RESPIMAT) 20-100 MCG/ACT AERS respimat Inhale 1 puff into the lungs every 6 (six) hours. 4 g 11  . Lancets Misc. (ACCU-CHEK FASTCLIX LANCET) KIT 1 each by Does not apply route 3 (three) times daily. Use to monitor glucose levels TID; E11.9 1 kit 0  . levothyroxine (SYNTHROID) 100 MCG tablet Take 1 tablet  (100 mcg total) by mouth daily before breakfast. 90 tablet 3  . loratadine (CLARITIN) 10 MG tablet Take 1 tablet (10 mg total) by mouth daily. 90 tablet 3  . Methylnaltrexone Bromide (RELISTOR) 150 MG TABS  Take 3 tablets by mouth daily. With water >30 min before first mea 270 tablet 0  . Na Sulfate-K Sulfate-Mg Sulf 17.5-3.13-1.6 GM/177ML SOLN Suprep as directed, no substitutions 354 mL 0  . Omega-3 Fatty Acids (FISH OIL) 1000 MG CAPS Take by mouth.    Marland Kitchen omeprazole (PRILOSEC) 40 MG capsule TAKE 1 CAPSULE DAILY 30 MINUTES BEFORE A MEAL 90 capsule 3  . Oxycodone HCl 10 MG TABS Take 1 tablet by mouth every 4 (four) hours as needed.    . pregabalin (LYRICA) 100 MG capsule Take 1 capsule by mouth at bedtime.    . rosuvastatin (CRESTOR) 20 MG tablet Take 1 tablet (20 mg total) by mouth daily. 90 tablet 0  . tiZANidine (ZANAFLEX) 4 MG tablet Take 4 mg by mouth every 6 (six) hours as needed for muscle spasms.    . traZODone (DESYREL) 100 MG tablet Take 1 tablet (100 mg total) by mouth at bedtime as needed for sleep. 30 tablet 2   No current facility-administered medications on file prior to visit.    Allergies  Allergen Reactions  . Latuda [Lurasidone Hcl] Shortness Of Breath  . Coconut Flavor Swelling and Rash  . Ibuprofen Swelling and Rash  . Prednisone Swelling and Rash  . Cyclobenzaprine     "almost made heart stop, couldn't breathe"  . Meloxicam Itching  . Morphine Nausea And Vomiting    Family History  Problem Relation Age of Onset  . Heart disease Mother   . Hyperlipidemia Mother   . Hypertension Mother   . Heart disease Father   . Stroke Father   . Hypertension Father   . Hyperlipidemia Father   . Hypertension Sister   . Hypertension Brother   . Cancer Daughter        5 different kinds of cancer  . Hypertension Maternal Grandmother   . Hypertension Maternal Grandfather   . Cancer Maternal Grandfather   . Hypertension Paternal Grandmother   . Hypertension Paternal  Grandfather   . Cancer Maternal Aunt        breast and ovarian  . Cancer Paternal Aunt        breast  . Colon cancer Neg Hx   . Esophageal cancer Neg Hx   . Rectal cancer Neg Hx   . Stomach cancer Neg Hx   . Liver cancer Neg Hx     BP 124/60 (BP Location: Left Arm, Patient Position: Sitting, Cuff Size: Large)   Pulse 69   Ht 5' 1"  (1.549 m)   Wt 155 lb 6.4 oz (70.5 kg)   SpO2 94%   BMI 29.36 kg/m    Review of Systems She denies hypoglycemia    Objective:   Physical Exam VITAL SIGNS:  See vs page GENERAL: no distress NECK: right sided thyroid nodule is again noted.  approx 2 cm diameter.  Pulses: dorsalis pedis intact bilat.   MSK: no deformity of the feet CV: no leg edema Skin:  no ulcer on the feet.  normal color and temp on the feet, except for port-wine on right foot.   Neuro: sensation is intact to touch on the feet.     Lab Results  Component Value Date   HGBA1C 7.3 (A) 07/03/2019   Lab Results  Component Value Date   CREATININE 0.82 10/15/2018   BUN 19 10/15/2018   NA 143 10/15/2018   K 4.2 10/15/2018   CL 108 (H) 10/15/2018   CO2 21 10/15/2018   Lab Results  Component Value  Date   TSH 2.560 06/24/2018      Assessment & Plan:  Type 2 DM, with PAD: worse MNG: due for recheck.  Patient Instructions  I have sent a prescription to your pharmacy, to increase the metformin.   Please continue the same other diabetes medications.  Blood tests are requested for you today.  We'll let you know about the results.   Let's recheck the ultrasound.  you will receive a phone call, about a day and time for an appointment.  check your blood sugar once a day.  vary the time of day when you check, between before the 3 meals, and at bedtime.  also check if you have symptoms of your blood sugar being too high or too low.  please keep a record of the readings and bring it to your next appointment here (or you can bring the meter itself).  You can write it on any piece of  paper.  please call us sooner if your blood sugar goes below 70, or if you have a lot of readings over 200.

## 2019-07-03 NOTE — Patient Instructions (Addendum)
I have sent a prescription to your pharmacy, to increase the metformin.   Please continue the same other diabetes medications.  Blood tests are requested for you today.  We'll let you know about the results.   Let's recheck the ultrasound.  you will receive a phone call, about a day and time for an appointment.  check your blood sugar once a day.  vary the time of day when you check, between before the 3 meals, and at bedtime.  also check if you have symptoms of your blood sugar being too high or too low.  please keep a record of the readings and bring it to your next appointment here (or you can bring the meter itself).  You can write it on any piece of paper.  please call us sooner if your blood sugar goes below 70, or if you have a lot of readings over 200.

## 2019-07-10 ENCOUNTER — Other Ambulatory Visit: Payer: Self-pay

## 2019-07-10 ENCOUNTER — Telehealth: Payer: Self-pay | Admitting: Endocrinology

## 2019-07-10 ENCOUNTER — Telehealth: Payer: Self-pay

## 2019-07-10 DIAGNOSIS — E1151 Type 2 diabetes mellitus with diabetic peripheral angiopathy without gangrene: Secondary | ICD-10-CM

## 2019-07-10 MED ORDER — CYCLOSET 0.8 MG PO TABS: 1.0000 | ORAL_TABLET | Freq: Every day | ORAL | 3 refills | Status: DC

## 2019-07-10 NOTE — Telephone Encounter (Signed)
PRIOR AUTHORIZATION  PA initiation date: 07/10/19  Medication: Cycloset 0.8mg  Insurance Company: [employer] Submission completed electronically through [employer] My Meds: Yes  Will await insurance response re: approval/denial.  Cassell Clement Key: [id number] - PA Case ID: [id number] - Rx #[id number] Need help? Call us at [phone] Status Sent to [place] 0.8MG  tablets Form [employer] Medicare Part D Electronic Prior Authorization Form (2017 NCPDP) Original Claim Info [phone] Provide Exception Process Printed NoticeTransition max 30 day unless unbreakableDr. Submit ePA at [email or web address] Requires Prior AuthorizationCMS Transition Per Fill Days Supply Maxof 30 exceededFor C  APPROVAL  Medication: Cycloset 0.8mg  Insurance Company: [employer] PA response: APPROVED  Documents have been labeled and placed in scan file for HIM and for our future reference.  Gweneth Fritter valencia (Key: [id number])  This request has received a Favorable outcome.  Please note any additional information provided by [employer] at the bottom of your screen.  You will also receive a faxed copy of the determination.  Gweneth Fritter valencia Key: [id number] - PA Case ID: [id number] - Rx #[id number] Need help? Call us at [phone] Outcome Approvedtoday Request Reference Number: [id number]. CYCLOSET TAB 0.8MG  is approved through 04/30/2020. Your patient may now fill this prescription and it will be covered. Drug Cycloset 0.8MG  tablets Form [employer] Medicare Part D Electronic Prior Authorization Form (2017 NCPDP) [id number] Provide Exception Process Printed NoticeTransition max 30 day unless unbreakableDr. Submit ePA at [email or web address] Requires Prior AuthorizationCMS Transition Per Fill Days Supply Maxof 30 exceededFor C

## 2019-07-10 NOTE — Telephone Encounter (Signed)
MEDICATION: cycloset  PHARMACY:   [id number] [place] [place] Phone:  [phone]  Fax:  [phone]     IS THIS A 90 DAY SUPPLY : yes  IS PATIENT OUT OF MEDICATION: no  IF NOT; HOW MUCH IS LEFT: 4 days  LAST APPOINTMENT DATE: 07/03/2019  NEXT APPOINTMENT DATE: 10/03/2019  DO WE HAVE YOUR PERMISSION TO LEAVE A DETAILED MESSAGE: yes  OTHER COMMENTS:    **Let patient know to contact pharmacy at the end of the day to make sure medication is ready. **  ** Please notify patient to allow 48-72 hours to process**  **Encourage patient to contact the pharmacy for refills or they can request refills through [facility]**

## 2019-07-10 NOTE — Telephone Encounter (Signed)
Outpatient Medication Detail   Disp Refills Start End   Bromocriptine Mesylate (CYCLOSET) 0.8 MG TABS 90 tablet 3 07/10/2019    Sig - Route: Take 1 tablet (0.8 mg total) by mouth daily. - Oral   Sent to pharmacy as: Bromocriptine Mesylate (CYCLOSET) 0.8 MG Tab   E-Prescribing Status: Receipt confirmed by pharmacy (07/10/2019  9:37 AM EST)

## 2019-07-17 ENCOUNTER — Other Ambulatory Visit: Payer: Medicare Other

## 2019-07-17 ENCOUNTER — Ambulatory Visit
Admission: RE | Admit: 2019-07-17 | Discharge: 2019-07-17 | Disposition: A | Discharge status: Home / Self Care | Payer: Medicare Other | Source: Ambulatory Visit | Attending: Endocrinology | Admitting: Endocrinology

## 2019-07-17 DIAGNOSIS — E042 Nontoxic multinodular goiter: Secondary | ICD-10-CM

## 2019-07-18 ENCOUNTER — Encounter: Payer: Self-pay | Admitting: Endocrinology

## 2019-07-22 ENCOUNTER — Other Ambulatory Visit: Payer: Self-pay | Admitting: Cardiovascular Disease

## 2019-07-22 MED ORDER — EZETIMIBE 10 MG PO TABS: 10.0000 mg | ORAL_TABLET | Freq: Every day | ORAL | 0 refills | Status: AC

## 2019-07-22 NOTE — Telephone Encounter (Signed)
Pts

## 2019-08-26 ENCOUNTER — Other Ambulatory Visit: Payer: Self-pay

## 2019-08-26 ENCOUNTER — Telehealth: Payer: Self-pay | Admitting: Endocrinology

## 2019-08-26 DIAGNOSIS — E119 Type 2 diabetes mellitus without complications: Secondary | ICD-10-CM

## 2019-08-26 DIAGNOSIS — E890 Postprocedural hypothyroidism: Secondary | ICD-10-CM

## 2019-08-26 MED ORDER — ACARBOSE 50 MG PO TABS: 50.0000 mg | ORAL_TABLET | Freq: Three times a day (TID) | ORAL | 3 refills | Status: DC

## 2019-08-26 NOTE — Telephone Encounter (Signed)
Medication Refill Request  Did you call your pharmacy and request this refill first? Yes  . If patient has not contacted pharmacy first, instruct them to do so for future refills.  . Remind them that contacting the pharmacy for their refill is the quickest method to get the refill.  . Refill policy also stated that it will take anywhere between 24-72 hours to receive the refill.    Name of medication? Acarbos  Is this a 90 day supply? Patient does not know   [facility] [place] Is the request for diabetes test strips? No . If yes, what brand? n/a

## 2019-08-26 NOTE — Telephone Encounter (Signed)
Is this ok to refill?   Please advise.  Thank you

## 2019-08-26 NOTE — Telephone Encounter (Signed)
Please refill prn 

## 2019-08-28 ENCOUNTER — Other Ambulatory Visit: Payer: Self-pay

## 2019-08-28 DIAGNOSIS — E119 Type 2 diabetes mellitus without complications: Secondary | ICD-10-CM

## 2019-08-28 DIAGNOSIS — E890 Postprocedural hypothyroidism: Secondary | ICD-10-CM

## 2019-08-28 MED ORDER — ACARBOSE 50 MG PO TABS: 50.0000 mg | ORAL_TABLET | Freq: Three times a day (TID) | ORAL | 3 refills | Status: DC

## 2019-08-28 NOTE — Telephone Encounter (Signed)
Pharmacy  [id number] [place] [place]  [place], [place] [address]  Phone:  [phone]  Fax:  [phone]    Outpatient Medication Detail   Disp Refills Start End   acarbose (PRECOSE) 50 MG tablet 270 tablet 3 08/28/2019    Sig - Route: Take 1 tablet (50 mg total) by mouth 3 (three) times daily with meals. - Oral   Sent to pharmacy as: acarbose (PRECOSE) 50 MG tablet   E-Prescribing Status: Receipt confirmed by pharmacy (08/28/2019  2:00 PM EDT)

## 2019-08-28 NOTE — Telephone Encounter (Signed)
Patient called re: RX for acarbose (PRECOSE) 50 MG tablet was sent to the wrong Pharmacy. Patient requests that the RX with refills for acarbose (PRECOSE) 50 MG tablet that was sent to [facility] be sent to: [id number] [place] [place] Phone:  [phone]  Fax:  [phone]

## 2019-10-03 ENCOUNTER — Ambulatory Visit: Payer: Medicare Other | Admitting: Endocrinology

## 2019-11-04 ENCOUNTER — Other Ambulatory Visit: Payer: Self-pay | Admitting: Orthopedic Surgery

## 2019-11-14 NOTE — Progress Notes (Signed)
[place], [place] [address] Phone: [phone] Fax: [place], [place] [address] [place] [address] Phone: [phone] Fax: [phone]  [id number] [place], [place] [address] [place] [address] Phone: [phone] Fax: [phone]      Your procedure is scheduled on Wednesday, 11/19/2019.  Report to [facility] Main Entrance "A" at 11:45 A.M., and check in at the Admitting office.  Call this number if you have problems the morning of surgery:  [phone]  Call [phone] if you have any questions prior to your surgery date Monday-Friday 8am-4pm    Remember:  Do not eat after midnight the night before your surgery  You may drink clear liquids until 11:45am the morning of your surgery.   Clear liquids allowed are: Water, Non-Citrus Juices (without pulp), Carbonated Beverages, Clear Tea, Black Coffee Only, and Gatorade   Enhanced Recovery after Surgery for Orthopedics Enhanced Recovery after Surgery is a protocol used to improve the stress on your body and your recovery after surgery.  Patient Instructions  . The night before surgery:  o No food after midnight. ONLY clear liquids after midnight  . The day of surgery (if you have diabetes): o  o Drink ONE (1) Gatorade 2 (G2) by 11:45am the morning of surgery o This drink was given to you during your hospital  pre-op appointment visit.  o Color of the Gatorade may vary. Red is not allowed. o Nothing else to drink after completing the  Gatorade 2 (G2).         If you have questions, please contact your surgeon's office.     Take these medicines the morning of surgery with A SIP OF WATER: Carvedilol (Coreg) Duloxetine (Cymbalta) Ezetimbe (Zetia) Famotidine (Pepcid) Fenofibrate (Tricor) Loratadine  (Claritin) Omeprazole (Prilosec) Pregabalin (Lyrica) Rosuvastatin (Crestor)  If Needed, you may take the following: Ipratropium-Albuterol (Combivent) inhaler Oxycodone HCl Tizanidine (Zanaflex)   As of today, STOP taking any Aspirin (unless otherwise instructed by your surgeon) Aleve, Naproxen, Ibuprofen, Motrin, Advil, Goody's, BC's, all herbal medications, fish oil, and all vitamins.   WHAT DO I DO ABOUT MY DIABETES MEDICATION?  . HOLD empagliflozin (Jardiance) the day before your surgery and do not take the day of surgery.  . Do not take oral diabetes medicines (pills) the morning of surgery - do NOT take Metformin (Glucophage), Acarbose (Precose), or Bromocriptine Mesylate (Cycloset)   HOW TO MANAGE YOUR DIABETES BEFORE AND AFTER SURGERY  Why is it important to control my blood sugar before and after surgery? . Improving blood sugar levels before and after surgery helps healing and can limit problems. . A way of improving blood sugar control is eating a healthy diet by: o  Eating less sugar and carbohydrates o  Increasing activity/exercise o  Talking with your doctor about reaching your blood sugar goals . High blood sugars (greater than 180 mg/dL) can raise your risk of infections and slow your recovery, so you will need to focus on controlling your diabetes during the weeks before surgery. . Make sure that the doctor who takes care of your diabetes knows about your planned surgery including the date and location.  How do I manage my blood sugar before surgery? . Check your blood sugar at least 4 times a day, starting 2 days before surgery, to make sure that the level is  not too high or low. . Check your blood sugar the morning of your surgery when you wake up and every 2 hours until you get to the Short Stay unit. o If your blood sugar is less than 70 mg/dL, you will need to treat for low blood sugar: . Do NOT eat. - Do not take insulin. - Treat a low blood sugar (less  than 70 mg/dL) with  cup of clear juice (cranberry or apple), 4 glucose tablets, OR glucose gel. - Recheck blood sugar in 15 minutes after treatment (to make sure it is greater than 70 mg/dL). If your blood sugar is not greater than 70 mg/dL on recheck, call [phone] for further instructions. . Report your blood sugar to the short stay nurse when you get to Short Stay.  . If you are admitted to the hospital after surgery: o Your blood sugar will be checked by the staff and you will probably be given insulin after surgery (instead of oral diabetes medicines) to make sure you have good blood sugar levels. o The goal for blood sugar control after surgery is 80-180 mg/dL.                        Do not wear jewelry, make up, or nail polish            Do not wear lotions, powders, perfumes, or deodorant.            Do not shave 48 hours prior to surgery.              Do not bring valuables to the hospital.            [facility] is not responsible for any belongings or valuables.  Do NOT Smoke (Tobacco/Vaping) or drink Alcohol 24 hours prior to your procedure  If you use a CPAP at night, you may bring all equipment for your overnight stay.   Contacts, glasses, dentures or bridgework may not be worn into surgery.      For patients admitted to the hospital, discharge time will be determined by your treatment team.   Patients discharged the day of surgery will not be allowed to drive home, and someone needs to stay with them for 24 hours.    Special instructions:   [facility]- Preparing For Surgery  Before surgery, you can play an important role. Because skin is not sterile, your skin needs to be as free of germs as possible. You can reduce the number of germs on your skin by washing with CHG (chlorahexidine gluconate) Soap before surgery.  CHG is an antiseptic cleaner which kills germs and bonds with the skin to continue killing germs even after washing.    Oral Hygiene is also  important to reduce your risk of infection.  Remember - BRUSH YOUR TEETH THE MORNING OF SURGERY WITH YOUR REGULAR TOOTHPASTE  Please do not use if you have an allergy to CHG or antibacterial soaps. If your skin becomes reddened/irritated stop using the CHG.  Do not shave (including legs and underarms) for at least 48 hours prior to first CHG shower. It is OK to shave your face.  Please follow these instructions carefully.   1. Shower the NIGHT BEFORE SURGERY and the MORNING OF SURGERY with CHG Soap.   2. If you chose to wash your hair, wash your hair first as usual with your normal shampoo.  3. After you shampoo, rinse your hair and body thoroughly  to remove the shampoo.  4. Use CHG as you would any other liquid soap. You can apply CHG directly to the skin and wash gently with a scrungie or a clean washcloth.   5. Apply the CHG Soap to your body ONLY FROM THE NECK DOWN.  Do not use on open wounds or open sores. Avoid contact with your eyes, ears, mouth and genitals (private parts). Wash Face and genitals (private parts)  with your normal soap.   6. Wash thoroughly, paying special attention to the area where your surgery will be performed.  7. Thoroughly rinse your body with warm water from the neck down.  8. DO NOT shower/wash with your normal soap after using and rinsing off the CHG Soap.  9. Pat yourself dry with a CLEAN TOWEL.  10. Wear CLEAN PAJAMAS to bed the night before surgery  11. Place CLEAN SHEETS on your bed the night of your first shower and DO NOT SLEEP WITH PETS.   Day of Surgery: Shower with CHG soap as directed Wear Clean/Comfortable clothing the morning of surgery Do not apply any deodorants/lotions.   Remember to brush your teeth WITH YOUR REGULAR TOOTHPASTE.   Please read over the following fact sheets that you were given.

## 2019-11-17 ENCOUNTER — Other Ambulatory Visit: Payer: Self-pay

## 2019-11-17 ENCOUNTER — Other Ambulatory Visit (HOSPITAL_COMMUNITY)
Admission: RE | Admit: 2019-11-17 | Discharge: 2019-11-17 | Disposition: A | Discharge status: Home / Self Care | Payer: Medicare HMO | Source: Ambulatory Visit | Attending: Orthopedic Surgery | Admitting: Orthopedic Surgery

## 2019-11-17 ENCOUNTER — Encounter (HOSPITAL_COMMUNITY): Payer: Self-pay

## 2019-11-17 ENCOUNTER — Encounter (HOSPITAL_COMMUNITY)
Admission: RE | Admit: 2019-11-17 | Discharge: 2019-11-17 | Disposition: A | Discharge status: Home / Self Care | Payer: Medicare HMO | Source: Ambulatory Visit | Attending: Orthopedic Surgery | Admitting: Orthopedic Surgery

## 2019-11-17 DIAGNOSIS — E119 Type 2 diabetes mellitus without complications: Secondary | ICD-10-CM | POA: Insufficient documentation

## 2019-11-17 DIAGNOSIS — E785 Hyperlipidemia, unspecified: Secondary | ICD-10-CM | POA: Insufficient documentation

## 2019-11-17 DIAGNOSIS — I2510 Atherosclerotic heart disease of native coronary artery without angina pectoris: Secondary | ICD-10-CM | POA: Insufficient documentation

## 2019-11-17 DIAGNOSIS — Z01818 Encounter for other preprocedural examination: Secondary | ICD-10-CM | POA: Insufficient documentation

## 2019-11-17 DIAGNOSIS — Z20822 Contact with and (suspected) exposure to covid-19: Secondary | ICD-10-CM | POA: Insufficient documentation

## 2019-11-17 DIAGNOSIS — F17200 Nicotine dependence, unspecified, uncomplicated: Secondary | ICD-10-CM | POA: Insufficient documentation

## 2019-11-17 DIAGNOSIS — I10 Essential (primary) hypertension: Secondary | ICD-10-CM | POA: Insufficient documentation

## 2019-11-17 DIAGNOSIS — Z951 Presence of aortocoronary bypass graft: Secondary | ICD-10-CM | POA: Insufficient documentation

## 2019-11-17 HISTORY — DX: Post-traumatic stress disorder, unspecified: F43.10

## 2019-11-17 LAB — COMPREHENSIVE METABOLIC PANEL
ALT: 9 U/L (ref 0–44)
AST: 17 U/L (ref 15–41)
Albumin: 3.4 g/dL — ABNORMAL LOW (ref 3.5–5.0)
Alkaline Phosphatase: 68 U/L (ref 38–126)
Anion gap: 9 (ref 5–15)
BUN: 13 mg/dL (ref 6–20)
CO2: 25 mmol/L (ref 22–32)
Calcium: 9.4 mg/dL (ref 8.9–10.3)
Chloride: 109 mmol/L (ref 98–111)
Creatinine, Ser: 0.7 mg/dL (ref 0.44–1.00)
GFR calc Af Amer: >60 mL/min (ref >60)
GFR calc non Af Amer: >60 mL/min (ref >60)
Glucose, Bld: 145 mg/dL — ABNORMAL HIGH (ref 70–99)
Potassium: 3.8 mmol/L (ref 3.5–5.1)
Sodium: 143 mmol/L (ref 135–145)
Total Bilirubin: 0.8 mg/dL (ref 0.3–1.2)
Total Protein: 6.2 g/dL — ABNORMAL LOW (ref 6.5–8.1)

## 2019-11-17 LAB — URINALYSIS, ROUTINE W REFLEX MICROSCOPIC
Bacteria, UA: NONE SEEN
Bilirubin Urine: NEGATIVE
Glucose, UA: >=500 mg/dL — AB
Hgb urine dipstick: NEGATIVE
Ketones, ur: NEGATIVE mg/dL
Leukocytes,Ua: NEGATIVE
Nitrite: NEGATIVE
Protein, ur: NEGATIVE mg/dL
Specific Gravity, Urine: 1.025 (ref 1.005–1.030)
pH: 5 (ref 5.0–8.0)

## 2019-11-17 LAB — HEMOGLOBIN A1C
Hgb A1c MFr Bld: 7.3 % — ABNORMAL HIGH (ref 4.8–5.6)
Mean Plasma Glucose: 162.81 mg/dL

## 2019-11-17 LAB — CBC WITH DIFFERENTIAL/PLATELET
Abs Immature Granulocytes: 0.02 10*3/uL (ref 0.00–0.07)
Basophils Absolute: 0.1 10*3/uL (ref 0.0–0.1)
Basophils Relative: 1 %
Eosinophils Absolute: 0.1 10*3/uL (ref 0.0–0.5)
Eosinophils Relative: 2 %
HCT: 47.1 % — ABNORMAL HIGH (ref 36.0–46.0)
Hemoglobin: 14.8 g/dL (ref 12.0–15.0)
Immature Granulocytes: 0 %
Lymphocytes Relative: 35 %
Lymphs Abs: 2.7 10*3/uL (ref 0.7–4.0)
MCH: 27.8 pg (ref 26.0–34.0)
MCHC: 31.4 g/dL (ref 30.0–36.0)
MCV: 88.4 fL (ref 80.0–100.0)
Monocytes Absolute: 0.7 10*3/uL (ref 0.1–1.0)
Monocytes Relative: 9 %
Neutro Abs: 4.1 10*3/uL (ref 1.7–7.7)
Neutrophils Relative %: 53 %
Platelets: 254 10*3/uL (ref 150–400)
RBC: 5.33 MIL/uL — ABNORMAL HIGH (ref 3.87–5.11)
RDW: 13.5 % (ref 11.5–15.5)
WBC: 7.7 10*3/uL (ref 4.0–10.5)
nRBC: 0 % (ref 0.0–0.2)

## 2019-11-17 LAB — SARS CORONAVIRUS 2 (TAT 6-24 HRS): SARS Coronavirus 2: NEGATIVE

## 2019-11-17 LAB — TYPE AND SCREEN
ABO/RH(D): O POS
Antibody Screen: NEGATIVE

## 2019-11-17 LAB — GLUCOSE, CAPILLARY: Glucose-Capillary: 225 mg/dL — ABNORMAL HIGH (ref 70–99)

## 2019-11-17 LAB — APTT: aPTT: 29 seconds (ref 24–36)

## 2019-11-17 LAB — SURGICAL PCR SCREEN
MRSA, PCR: NEGATIVE
Staphylococcus aureus: NEGATIVE

## 2019-11-17 LAB — PROTIME-INR
INR: 1 (ref 0.8–1.2)
Prothrombin Time: 12.3 seconds (ref 11.4–15.2)

## 2019-11-17 NOTE — Progress Notes (Signed)
Your procedure is scheduled on Wednesday, 11/19/2019.  Report to [facility] Main Entrance "A" at 11:45 A.M., and check in at the Admitting office.  Call this number if you have problems the morning of surgery:  [phone]  Call [phone] if you have any questions prior to your surgery date Monday-Friday 8am-4pm    Remember:  Do not eat after midnight the night before your surgery  You may drink clear liquids until 11:45am the morning of your surgery.   Clear liquids allowed are: Water, Non-Citrus Juices (without pulp), Carbonated Beverages, Clear Tea, Black Coffee Only, and Gatorade   Enhanced Recovery after Surgery for Orthopedics Enhanced Recovery after Surgery is a protocol used to improve the stress on your body and your recovery after surgery.  Patient Instructions  . The night before surgery:  o No food after midnight. ONLY clear liquids after midnight  . The day of surgery (if you have diabetes): o  o Drink ONE (1) Gatorade 2 (G2) by 11:45am the morning of surgery o This drink was given to you during your hospital  pre-op appointment visit.  o Color of the Gatorade may vary. Red is not allowed. o Nothing else to drink after completing the  Gatorade 2 (G2).         If you have questions, please contact your surgeon's office.     Take these medicines the morning of surgery with A SIP OF WATER: Carvedilol (Coreg) Duloxetine (Cymbalta) Ezetimbe (Zetia) Famotidine (Pepcid) Fenofibrate (Tricor) Loratadine (Claritin) Omeprazole (Prilosec) Pregabalin (Lyrica) Rosuvastatin (Crestor)  If Needed, you may take the following: Ipratropium-Albuterol (Combivent) inhaler Oxycodone HCl Tizanidine (Zanaflex)   As of today, STOP taking any Aspirin (unless otherwise instructed by your surgeon) Aleve, Naproxen, Ibuprofen, Motrin, Advil, Goody's, BC's, all herbal medications, fish oil, and all vitamins.   WHAT DO I DO ABOUT MY DIABETES MEDICATION?  . HOLD  empagliflozin (Jardiance) the day before your surgery and do not take the day of surgery.  . Do not take oral diabetes medicines (pills) the morning of surgery - do NOT take Metformin (Glucophage), Acarbose (Precose), or Bromocriptine Mesylate (Cycloset)   HOW TO MANAGE YOUR DIABETES BEFORE AND AFTER SURGERY  Why is it important to control my blood sugar before and after surgery? . Improving blood sugar levels before and after surgery helps healing and can limit problems. . A way of improving blood sugar control is eating a healthy diet by: o  Eating less sugar and carbohydrates o  Increasing activity/exercise o  Talking with your doctor about reaching your blood sugar goals . High blood sugars (greater than 180 mg/dL) can raise your risk of infections and slow your recovery, so you will need to focus on controlling your diabetes during the weeks before surgery. . Make sure that the doctor who takes care of your diabetes knows about your planned surgery including the date and location.  How do I manage my blood sugar before surgery? . Check your blood sugar at least 4 times a day, starting 2 days before surgery, to make sure that the level is not too high or low. . Check your blood sugar the morning of your surgery when you wake up and every 2 hours until you get to the Short Stay unit. o If your blood sugar is less than 70 mg/dL, you will need to treat for low blood sugar: . Do NOT eat. - Do not take insulin. - Treat a low blood sugar (less than 70 mg/dL)  with  cup of clear juice (cranberry or apple), 4 glucose tablets, OR glucose gel. - Recheck blood sugar in 15 minutes after treatment (to make sure it is greater than 70 mg/dL). If your blood sugar is not greater than 70 mg/dL on recheck, call [phone] for further instructions. . Report your blood sugar to the short stay nurse when you get to Short Stay.  . If you are admitted to the hospital after surgery: o Your blood sugar will  be checked by the staff and you will probably be given insulin after surgery (instead of oral diabetes medicines) to make sure you have good blood sugar levels. o The goal for blood sugar control after surgery is 80-180 mg/dL.                        Do not wear jewelry, make up, or nail polish            Do not wear lotions, powders, perfumes, or deodorant.            Do not shave 48 hours prior to surgery.              Do not bring valuables to the hospital.            [facility] is not responsible for any belongings or valuables.  Do NOT Smoke (Tobacco/Vaping) or drink Alcohol 24 hours prior to your procedure  If you use a CPAP at night, you may bring all equipment for your overnight stay.   Contacts, glasses, dentures or bridgework may not be worn into surgery.      For patients admitted to the hospital, discharge time will be determined by your treatment team.   Patients discharged the day of surgery will not be allowed to drive home, and someone needs to stay with them for 24 hours.    Special instructions:   [facility]- Preparing For Surgery  Before surgery, you can play an important role. Because skin is not sterile, your skin needs to be as free of germs as possible. You can reduce the number of germs on your skin by washing with CHG (chlorahexidine gluconate) Soap before surgery.  CHG is an antiseptic cleaner which kills germs and bonds with the skin to continue killing germs even after washing.    Oral Hygiene is also important to reduce your risk of infection.  Remember - BRUSH YOUR TEETH THE MORNING OF SURGERY WITH YOUR REGULAR TOOTHPASTE  Please do not use if you have an allergy to CHG or antibacterial soaps. If your skin becomes reddened/irritated stop using the CHG.  Do not shave (including legs and underarms) for at least 48 hours prior to first CHG shower. It is OK to shave your face.  Please follow these instructions carefully.   1. Shower the NIGHT BEFORE  SURGERY and the MORNING OF SURGERY with CHG Soap.   2. If you chose to wash your hair, wash your hair first as usual with your normal shampoo.  3. After you shampoo, rinse your hair and body thoroughly to remove the shampoo.  4. Use CHG as you would any other liquid soap. You can apply CHG directly to the skin and wash gently with a scrungie or a clean washcloth.   5. Apply the CHG Soap to your body ONLY FROM THE NECK DOWN.  Do not use on open wounds or open sores. Avoid contact with your eyes, ears, mouth and genitals (private parts).  Wash Face and genitals (private parts)  with your normal soap.   6. Wash thoroughly, paying special attention to the area where your surgery will be performed.  7. Thoroughly rinse your body with warm water from the neck down.  8. DO NOT shower/wash with your normal soap after using and rinsing off the CHG Soap.  9. Pat yourself dry with a CLEAN TOWEL.  10. Wear CLEAN PAJAMAS to bed the night before surgery  11. Place CLEAN SHEETS on your bed the night of your first shower and DO NOT SLEEP WITH PETS.   Day of Surgery: Shower with CHG soap as directed Wear Clean/Comfortable clothing the morning of surgery Do not apply any deodorants/lotions.   Remember to brush your teeth WITH YOUR REGULAR TOOTHPASTE.   Please read over the following fact sheets that you were given.

## 2019-11-17 NOTE — Progress Notes (Signed)
PCP - Dtr. Simona Huh  Cardiologist - Dr. Jacquiline Doe  Pulmonologist- at [facility]  In [facility]  Chest x-ray -   EKG - 11/17/19  Stress Test -   ECHO -   Cardiac Cath -    Sleep Study - no CPAP - no  LABS-CBC, CMP, T/S, Hemoglobin A1C.PCR  ASA- "stopped a week ago"  ERAS-yes HA1C- 7.3- 11/17/19 Fasting Blood Sugar - 125 Checks Blood Sugar _2____ times a day Patient rerportrs that CBG states under 200. Anesthesia-  Pt denies having chest pain, sob, or fever at this time. All instructions explained to the pt, with a verbal understanding of the material. Pt agrees to go over the instructions while at home for a better understanding. Pt also instructed to self quarantine after being tested for COVID-19. The opportunity to ask questions was provided.

## 2019-11-18 NOTE — Anesthesia Preprocedure Evaluation (Addendum)
Anesthesia Evaluation  Patient identified by MRN, date of birth, ID band Patient awake    Reviewed: Allergy & Precautions, NPO status , Patient's Chart, lab work & pertinent test results  Airway Mallampati: I  TM Distance: >3 FB Neck ROM: Full    Dental   Pulmonary COPD, Current Smoker and Patient abstained from smoking.,    Pulmonary exam normal        Cardiovascular hypertension, Pt. on medications + CAD, + Past MI and + CABG  Normal cardiovascular exam  echocardiogram of July 16, 2019 showing preserved left ventricular systolic function with ejection fraction 70%.   Neuro/Psych Anxiety Depression Bipolar Disorder  Neuromuscular disease    GI/Hepatic Neg liver ROS, GERD  Controlled and Medicated,  Endo/Other  diabetesHypothyroidism   Renal/GU negative Renal ROS     Musculoskeletal negative musculoskeletal ROS (+) Arthritis ,   Abdominal (+) - obese,   Peds  Hematology negative hematology ROS (+) Lab Results      Component                Value               Date                      WBC                      7.7                 11/17/2019                HGB                      14.8                11/17/2019                HCT                      47.1 (H)            11/17/2019                MCV                      88.4                11/17/2019                PLT                      254                 11/17/2019              Anesthesia Other Findings   Reproductive/Obstetrics                           Anesthesia Physical Anesthesia Plan  ASA: II  Anesthesia Plan: General   Post-op Pain Management:    Induction: Intravenous  PONV Risk Score and Plan: Treatment may vary due to age or medical condition, Ondansetron, Dexamethasone and Midazolam  Airway Management Planned: Oral ETT  Additional Equipment: None  Intra-op Plan:   Post-operative Plan: Extubation in OR  Informed  Consent: I have reviewed the patients History and Physical, chart, labs and discussed the procedure  including the risks, benefits and alternatives for the proposed anesthesia with the patient or authorized representative who has indicated his/her understanding and acceptance.     Dental advisory given  Plan Discussed with: CRNA and Surgeon  Anesthesia Plan Comments: (PAT note by Karoline Caldwell, PA-C: Patient follows with cardiologist at [facility] for history of CAD status post CABG x 3 in 2008. Last seen by Dr. Brigitte Pulse 11/17/19 and cleared for surgery at that time. Per note, " patient is a 59 year old woman who is asymptomatic from a cardiac standpoint presenting with a significant cardiovascular history consisting of myocardial infarct x3 and three-vessel CABG 2008.  Her risk factors include DM, hypertension and hyperlipidemia.  She maintains an active lifestyle and is easily able to achieve greater than 5 METS functional capacity on a daily basis.  She is asymptomatic with echocardiogram of July 16, 2019 showing preserved left ventricular systolic function with ejection fraction 70%.  Patient has normal cardiac valve structure and function with age-appropriate diastolic function.  Right ventricular size and function was normal and no focal wall motion abnormalities were identified.  Left atrial size is slightly increased at 46 mm/m.  ECG November 17, 2019 shows sinus rhythm at 63 bpm with normal axis and Q waves inferior leads consistent with old myocardial infarct.  There are no acute ST or T wave changes.  Carotid Dopplers of Sep 18, 2019 showed only some mild plaquing.  Blood pressure today is normal at 120/70 mmHg with heart rate 70 bpm.  Patient cleared from cardiac standpoint for proceeding with lumbar sacral surgery.  Overall perioperative risk of cardiac complications are low.  No additional testing is required prior to her scheduled elective surgery.  Patient is at her baseline and  hemodynamically stable to proceed with surgery."  DM2 followed by endocrinologist Dr. Loanne Drilling.  Last A1c 7.3 on 11/17/2019.  Patient is a current everyday smoker. Spirometry 03/24/17 was normal with pre-bronchodialator FEV1/FVC 105% pred (but read as restriction possible as both FEV1 and FVC were in 70%tile and post was normal (FEV1 and FVC were both 80s%tile).  Preop labs reviewed, unremarkable.  EKG 11/17/2018: Normal sinus rhythm.  Rate 63. Possible Left atrial enlargement  TTE 07/16/2019 (copy on chart): Conclusions: 1.  Normal study: Normal cardiac chamber sizes and function; normal valve anatomy and function; no pericardial effusion or intracardiac mass.  No intracardiac shunts by 2-dimensional color flow imaging.  Normal thoracic aorta and aortic arch.  EF 60-65%. )      Anesthesia Quick Evaluation

## 2019-11-18 NOTE — Progress Notes (Signed)
Anesthesia Chart Review:  Patient follows with cardiologist at [facility] for history of CAD status post CABG x 3 in 2008. Last seen by Dr. Brigitte Pulse 11/17/19 and cleared for surgery at that time. Per note, " patient is a 59 year old woman who is asymptomatic from a cardiac standpoint presenting with a significant cardiovascular history consisting of myocardial infarct x3 and three-vessel CABG 2008.  Her risk factors include DM, hypertension and hyperlipidemia.  She maintains an active lifestyle and is easily able to achieve greater than 5 METS functional capacity on a daily basis.  She is asymptomatic with echocardiogram of July 16, 2019 showing preserved left ventricular systolic function with ejection fraction 70%.  Patient has normal cardiac valve structure and function with age-appropriate diastolic function.  Right ventricular size and function was normal and no focal wall motion abnormalities were identified.  Left atrial size is slightly increased at 46 mm/m.  ECG November 17, 2019 shows sinus rhythm at 63 bpm with normal axis and Q waves inferior leads consistent with old myocardial infarct.  There are no acute ST or T wave changes.  Carotid Dopplers of Sep 18, 2019 showed only some mild plaquing.  Blood pressure today is normal at 120/70 mmHg with heart rate 70 bpm.  Patient cleared from cardiac standpoint for proceeding with lumbar sacral surgery.  Overall perioperative risk of cardiac complications are low.  No additional testing is required prior to her scheduled elective surgery.  Patient is at her baseline and hemodynamically stable to proceed with surgery."  DM2 followed by endocrinologist Dr. Loanne Drilling.  Last A1c 7.3 on 11/17/2019.  Patient is a current everyday smoker. Spirometry 03/24/17 was normal with pre-bronchodialator FEV1/FVC 105% pred (but read as restriction possible as both FEV1 and FVC were in 70%tile and post was normal (FEV1 and FVC were both 80s%tile).  Preop labs reviewed,  unremarkable.  EKG 11/17/2018: Normal sinus rhythm.  Rate 63. Possible Left atrial enlargement  TTE 07/16/2019 (copy on chart): Conclusions: 1.  Normal study: Normal cardiac chamber sizes and function; normal valve anatomy and function; no pericardial effusion or intracardiac mass.  No intracardiac shunts by 2-dimensional color flow imaging.  Normal thoracic aorta and aortic arch.  EF 60-65%.   Wynonia Musty [facility] Short Stay Center/Anesthesiology Phone [phone] 11/18/2019 11:24 AM

## 2019-11-19 ENCOUNTER — Encounter (HOSPITAL_COMMUNITY): Payer: Self-pay | Admitting: Orthopedic Surgery

## 2019-11-19 ENCOUNTER — Encounter (HOSPITAL_COMMUNITY)
Admission: RE | Disposition: A | Discharge status: Home / Self Care | Payer: Self-pay | Source: Home / Self Care | Attending: Orthopedic Surgery

## 2019-11-19 ENCOUNTER — Ambulatory Visit (HOSPITAL_COMMUNITY)
Admission: RE | Admit: 2019-11-19 | Discharge: 2019-11-19 | Disposition: A | Discharge status: Home / Self Care | Payer: Medicare HMO | Attending: Orthopedic Surgery | Admitting: Orthopedic Surgery

## 2019-11-19 ENCOUNTER — Ambulatory Visit (HOSPITAL_COMMUNITY): Payer: Medicare HMO | Admitting: Anesthesiology

## 2019-11-19 ENCOUNTER — Other Ambulatory Visit: Payer: Self-pay

## 2019-11-19 ENCOUNTER — Ambulatory Visit (HOSPITAL_COMMUNITY): Payer: Medicare HMO | Admitting: Physician Assistant

## 2019-11-19 ENCOUNTER — Ambulatory Visit (HOSPITAL_COMMUNITY): Payer: Medicare HMO

## 2019-11-19 DIAGNOSIS — K219 Gastro-esophageal reflux disease without esophagitis: Secondary | ICD-10-CM | POA: Diagnosis not present

## 2019-11-19 DIAGNOSIS — Z885 Allergy status to narcotic agent status: Secondary | ICD-10-CM | POA: Insufficient documentation

## 2019-11-19 DIAGNOSIS — Z886 Allergy status to analgesic agent status: Secondary | ICD-10-CM | POA: Insufficient documentation

## 2019-11-19 DIAGNOSIS — Z8249 Family history of ischemic heart disease and other diseases of the circulatory system: Secondary | ICD-10-CM | POA: Diagnosis not present

## 2019-11-19 DIAGNOSIS — M5136 Other intervertebral disc degeneration, lumbar region: Secondary | ICD-10-CM | POA: Insufficient documentation

## 2019-11-19 DIAGNOSIS — G8929 Other chronic pain: Secondary | ICD-10-CM | POA: Insufficient documentation

## 2019-11-19 DIAGNOSIS — G709 Myoneural disorder, unspecified: Secondary | ICD-10-CM | POA: Insufficient documentation

## 2019-11-19 DIAGNOSIS — M1990 Unspecified osteoarthritis, unspecified site: Secondary | ICD-10-CM | POA: Insufficient documentation

## 2019-11-19 DIAGNOSIS — E7800 Pure hypercholesterolemia, unspecified: Secondary | ICD-10-CM | POA: Insufficient documentation

## 2019-11-19 DIAGNOSIS — Z7982 Long term (current) use of aspirin: Secondary | ICD-10-CM | POA: Insufficient documentation

## 2019-11-19 DIAGNOSIS — Z7984 Long term (current) use of oral hypoglycemic drugs: Secondary | ICD-10-CM | POA: Diagnosis not present

## 2019-11-19 DIAGNOSIS — F17200 Nicotine dependence, unspecified, uncomplicated: Secondary | ICD-10-CM | POA: Insufficient documentation

## 2019-11-19 DIAGNOSIS — F419 Anxiety disorder, unspecified: Secondary | ICD-10-CM | POA: Insufficient documentation

## 2019-11-19 DIAGNOSIS — Z91018 Allergy to other foods: Secondary | ICD-10-CM | POA: Insufficient documentation

## 2019-11-19 DIAGNOSIS — Z809 Family history of malignant neoplasm, unspecified: Secondary | ICD-10-CM | POA: Insufficient documentation

## 2019-11-19 DIAGNOSIS — M533 Sacrococcygeal disorders, not elsewhere classified: Secondary | ICD-10-CM

## 2019-11-19 DIAGNOSIS — Z419 Encounter for procedure for purposes other than remedying health state, unspecified: Secondary | ICD-10-CM

## 2019-11-19 DIAGNOSIS — Z951 Presence of aortocoronary bypass graft: Secondary | ICD-10-CM | POA: Insufficient documentation

## 2019-11-19 DIAGNOSIS — Z8349 Family history of other endocrine, nutritional and metabolic diseases: Secondary | ICD-10-CM | POA: Insufficient documentation

## 2019-11-19 DIAGNOSIS — Z823 Family history of stroke: Secondary | ICD-10-CM | POA: Diagnosis not present

## 2019-11-19 DIAGNOSIS — Z803 Family history of malignant neoplasm of breast: Secondary | ICD-10-CM | POA: Insufficient documentation

## 2019-11-19 DIAGNOSIS — Z8041 Family history of malignant neoplasm of ovary: Secondary | ICD-10-CM | POA: Insufficient documentation

## 2019-11-19 DIAGNOSIS — I252 Old myocardial infarction: Secondary | ICD-10-CM | POA: Diagnosis not present

## 2019-11-19 DIAGNOSIS — I10 Essential (primary) hypertension: Secondary | ICD-10-CM | POA: Insufficient documentation

## 2019-11-19 DIAGNOSIS — Z90710 Acquired absence of both cervix and uterus: Secondary | ICD-10-CM | POA: Insufficient documentation

## 2019-11-19 DIAGNOSIS — Z79899 Other long term (current) drug therapy: Secondary | ICD-10-CM | POA: Insufficient documentation

## 2019-11-19 DIAGNOSIS — F319 Bipolar disorder, unspecified: Secondary | ICD-10-CM | POA: Diagnosis not present

## 2019-11-19 DIAGNOSIS — E1136 Type 2 diabetes mellitus with diabetic cataract: Secondary | ICD-10-CM | POA: Diagnosis not present

## 2019-11-19 DIAGNOSIS — F17210 Nicotine dependence, cigarettes, uncomplicated: Secondary | ICD-10-CM | POA: Insufficient documentation

## 2019-11-19 DIAGNOSIS — J449 Chronic obstructive pulmonary disease, unspecified: Secondary | ICD-10-CM | POA: Diagnosis not present

## 2019-11-19 DIAGNOSIS — E039 Hypothyroidism, unspecified: Secondary | ICD-10-CM | POA: Diagnosis not present

## 2019-11-19 DIAGNOSIS — Z888 Allergy status to other drugs, medicaments and biological substances status: Secondary | ICD-10-CM | POA: Insufficient documentation

## 2019-11-19 DIAGNOSIS — I2510 Atherosclerotic heart disease of native coronary artery without angina pectoris: Secondary | ICD-10-CM | POA: Insufficient documentation

## 2019-11-19 HISTORY — PX: SACROILIAC JOINT FUSION: SHX6088

## 2019-11-19 LAB — GLUCOSE, CAPILLARY
Glucose-Capillary: 175 mg/dL — ABNORMAL HIGH (ref 70–99)
Glucose-Capillary: 87 mg/dL (ref 70–99)
Glucose-Capillary: 105 mg/dL — ABNORMAL HIGH (ref 70–99)

## 2019-11-19 SURGERY — SACROILIAC JOINT FUSION
Anesthesia: General | Site: Spine Lumbar | Laterality: Left

## 2019-11-19 MED ORDER — BUPIVACAINE-EPINEPHRINE 0.5% -1:200000 IJ SOLN
INTRAMUSCULAR | Status: AC
  Filled 2019-11-19: qty 1

## 2019-11-19 MED ORDER — MIDAZOLAM HCL 5 MG/5ML IJ SOLN
INTRAMUSCULAR | Status: DC | PRN
  Administered 2019-11-19: 2 mg via INTRAVENOUS

## 2019-11-19 MED ORDER — LIDOCAINE 2% (20 MG/ML) 5 ML SYRINGE
INTRAMUSCULAR | Status: DC | PRN
  Administered 2019-11-19: 80 mg via INTRAVENOUS

## 2019-11-19 MED ORDER — ONDANSETRON HCL 4 MG/2ML IJ SOLN
INTRAMUSCULAR | Status: AC
  Filled 2019-11-19: qty 2

## 2019-11-19 MED ORDER — ROCURONIUM BROMIDE 10 MG/ML (PF) SYRINGE
PREFILLED_SYRINGE | INTRAVENOUS | Status: DC | PRN
  Administered 2019-11-19: 50 mg via INTRAVENOUS

## 2019-11-19 MED ORDER — PROPOFOL 10 MG/ML IV BOLUS
INTRAVENOUS | Status: DC | PRN
  Administered 2019-11-19: 120 mg via INTRAVENOUS

## 2019-11-19 MED ORDER — DEXAMETHASONE SODIUM PHOSPHATE 10 MG/ML IJ SOLN
INTRAMUSCULAR | Status: AC
  Filled 2019-11-19: qty 1

## 2019-11-19 MED ORDER — MIDAZOLAM HCL 2 MG/2ML IJ SOLN
INTRAMUSCULAR | Status: AC
  Filled 2019-11-19: qty 2

## 2019-11-19 MED ORDER — BUPIVACAINE LIPOSOME 1.3 % IJ SUSP
INTRAMUSCULAR | Status: DC | PRN
  Administered 2019-11-19: 20 mL

## 2019-11-19 MED ORDER — PHENYLEPHRINE 40 MCG/ML (10ML) SYRINGE FOR IV PUSH (FOR BLOOD PRESSURE SUPPORT)
PREFILLED_SYRINGE | INTRAVENOUS | Status: AC
  Filled 2019-11-19: qty 10

## 2019-11-19 MED ORDER — PROPOFOL 10 MG/ML IV BOLUS
INTRAVENOUS | Status: AC
  Filled 2019-11-19: qty 20

## 2019-11-19 MED ORDER — LACTATED RINGERS IV SOLN: INTRAVENOUS | Status: DC

## 2019-11-19 MED ORDER — CEFAZOLIN SODIUM-DEXTROSE 2-4 GM/100ML-% IV SOLN
2.0000 g | INTRAVENOUS | Status: AC
  Administered 2019-11-19: 2 g via INTRAVENOUS

## 2019-11-19 MED ORDER — BUPIVACAINE-EPINEPHRINE 0.5% -1:200000 IJ SOLN
INTRAMUSCULAR | Status: DC | PRN
Start: 2019-11-19 — End: 2019-11-19
  Administered 2019-11-19 (×2): 4 mL

## 2019-11-19 MED ORDER — LIDOCAINE 2% (20 MG/ML) 5 ML SYRINGE
INTRAMUSCULAR | Status: AC
  Filled 2019-11-19: qty 5

## 2019-11-19 MED ORDER — ROCURONIUM BROMIDE 10 MG/ML (PF) SYRINGE
PREFILLED_SYRINGE | INTRAVENOUS | Status: AC
Start: 2019-11-19 — End: ?
  Filled 2019-11-19: qty 10

## 2019-11-19 MED ORDER — CEFAZOLIN SODIUM-DEXTROSE 2-4 GM/100ML-% IV SOLN
INTRAVENOUS | Status: AC
  Filled 2019-11-19: qty 100

## 2019-11-19 MED ORDER — OXYCODONE HCL 5 MG PO TABS
5.0000 mg | ORAL_TABLET | Freq: Once | ORAL | Status: AC | PRN
  Administered 2019-11-19: 5 mg via ORAL

## 2019-11-19 MED ORDER — SUGAMMADEX SODIUM 200 MG/2ML IV SOLN
INTRAVENOUS | Status: DC | PRN
  Administered 2019-11-19 (×2): 100 mg via INTRAVENOUS

## 2019-11-19 MED ORDER — BUPIVACAINE HCL (PF) 0.25 % IJ SOLN
INTRAMUSCULAR | Status: AC
  Filled 2019-11-19: qty 30

## 2019-11-19 MED ORDER — OXYCODONE HCL 5 MG/5ML PO SOLN: 5.0000 mg | Freq: Once | ORAL | Status: AC | PRN

## 2019-11-19 MED ORDER — PHENYLEPHRINE 40 MCG/ML (10ML) SYRINGE FOR IV PUSH (FOR BLOOD PRESSURE SUPPORT)
PREFILLED_SYRINGE | INTRAVENOUS | Status: DC | PRN
  Administered 2019-11-19: 80 ug via INTRAVENOUS
  Administered 2019-11-19: 120 ug via INTRAVENOUS
  Administered 2019-11-19: 80 ug via INTRAVENOUS

## 2019-11-19 MED ORDER — ACETAMINOPHEN 10 MG/ML IV SOLN
1000.0000 mg | Freq: Once | INTRAVENOUS | Status: DC | PRN
  Administered 2019-11-19: 1000 mg via INTRAVENOUS

## 2019-11-19 MED ORDER — EPHEDRINE 5 MG/ML INJ
INTRAVENOUS | Status: AC
  Filled 2019-11-19: qty 10

## 2019-11-19 MED ORDER — ONDANSETRON HCL 4 MG/2ML IJ SOLN
INTRAMUSCULAR | Status: DC | PRN
  Administered 2019-11-19: 4 mg via INTRAVENOUS

## 2019-11-19 MED ORDER — FENTANYL CITRATE (PF) 250 MCG/5ML IJ SOLN
INTRAMUSCULAR | Status: AC
  Filled 2019-11-19: qty 5

## 2019-11-19 MED ORDER — POVIDONE-IODINE 7.5 % EX SOLN
Freq: Once | CUTANEOUS | Status: DC
  Filled 2019-11-19: qty 118

## 2019-11-19 MED ORDER — CHLORHEXIDINE GLUCONATE 0.12 % MT SOLN
OROMUCOSAL | Status: AC
  Administered 2019-11-19: 15 mL via OROMUCOSAL
  Filled 2019-11-19: qty 15

## 2019-11-19 MED ORDER — HYDROMORPHONE HCL 1 MG/ML IJ SOLN: 0.2500 mg | INTRAMUSCULAR | Status: DC | PRN

## 2019-11-19 MED ORDER — ROCURONIUM BROMIDE 10 MG/ML (PF) SYRINGE
PREFILLED_SYRINGE | INTRAVENOUS | Status: AC
  Filled 2019-11-19: qty 10

## 2019-11-19 MED ORDER — CHLORHEXIDINE GLUCONATE 0.12 % MT SOLN: 15.0000 mL | Freq: Once | OROMUCOSAL | Status: AC

## 2019-11-19 MED ORDER — FENTANYL CITRATE (PF) 250 MCG/5ML IJ SOLN
INTRAMUSCULAR | Status: DC | PRN
  Administered 2019-11-19: 50 ug via INTRAVENOUS

## 2019-11-19 MED ORDER — ACETAMINOPHEN 10 MG/ML IV SOLN
INTRAVENOUS | Status: AC
  Filled 2019-11-19: qty 100

## 2019-11-19 MED ORDER — DROPERIDOL 2.5 MG/ML IJ SOLN: 0.6250 mg | Freq: Once | INTRAMUSCULAR | Status: DC | PRN

## 2019-11-19 MED ORDER — ONDANSETRON HCL 4 MG/2ML IJ SOLN: 4.0000 mg | Freq: Once | INTRAMUSCULAR | Status: DC | PRN

## 2019-11-19 MED ORDER — EPHEDRINE SULFATE-NACL 50-0.9 MG/10ML-% IV SOSY
PREFILLED_SYRINGE | INTRAVENOUS | Status: DC | PRN
  Administered 2019-11-19 (×3): 10 mg via INTRAVENOUS
  Administered 2019-11-19: 5 mg via INTRAVENOUS
  Administered 2019-11-19 (×2): 10 mg via INTRAVENOUS

## 2019-11-19 MED ORDER — ORAL CARE MOUTH RINSE: 15.0000 mL | Freq: Once | OROMUCOSAL | Status: AC

## 2019-11-19 MED ORDER — OXYCODONE HCL 5 MG PO TABS
ORAL_TABLET | ORAL | Status: AC
  Filled 2019-11-19: qty 1

## 2019-11-19 SURGICAL SUPPLY — 70 items
APL SKNCLS STERI-STRIP NONHPOA (GAUZE/BANDAGES/DRESSINGS) ×1
BENZOIN TINCTURE PRP APPL 2/3 (GAUZE/BANDAGES/DRESSINGS) ×2 IMPLANT
BIT DRILL CANNULATED 7.0X3.2MM (BIT) IMPLANT
BLADE CLIPPER SURG (BLADE) ×1 IMPLANT
BLADE SURG 10 STRL SS (BLADE) ×2 IMPLANT
CANISTER SUCT 3000ML PPV (MISCELLANEOUS) ×2 IMPLANT
COVER SURGICAL LIGHT HANDLE (MISCELLANEOUS) ×2 IMPLANT
COVER WAND RF STERILE (DRAPES) ×1 IMPLANT
DRAPE C-ARM 42X72 X-RAY (DRAPES) ×2 IMPLANT
DRAPE C-ARMOR (DRAPES) ×2 IMPLANT
DRAPE INCISE IOBAN 66X45 STRL (DRAPES) ×2 IMPLANT
DRAPE POUCH INSTRU U-SHP 10X18 (DRAPES) ×2 IMPLANT
DRAPE SURG 17X23 STRL (DRAPES) ×6 IMPLANT
DRILL CANNULATED 7.0X3.2MM (BIT) ×2
DURAPREP 26ML APPLICATOR (WOUND CARE) ×2 IMPLANT
ELECT CAUTERY BLADE 6.4 (BLADE) ×1 IMPLANT
ELECT REM PT RETURN 9FT ADLT (ELECTROSURGICAL) ×2
ELECTRODE REM PT RTRN 9FT ADLT (ELECTROSURGICAL) ×1 IMPLANT
GAUZE 4X4 16PLY RFD (DISPOSABLE) ×1 IMPLANT
GAUZE SPONGE 4X4 12PLY STRL (GAUZE/BANDAGES/DRESSINGS) ×1 IMPLANT
GAUZE SPONGE 4X4 12PLY STRL LF (GAUZE/BANDAGES/DRESSINGS) ×1 IMPLANT
GLOVE BIO SURGEON STRL SZ 6.5 (GLOVE) ×2 IMPLANT
GLOVE BIO SURGEON STRL SZ7 (GLOVE) ×2 IMPLANT
GLOVE BIO SURGEON STRL SZ8 (GLOVE) ×2 IMPLANT
GLOVE BIOGEL PI IND STRL 6.5 (GLOVE) IMPLANT
GLOVE BIOGEL PI IND STRL 7.0 (GLOVE) ×1 IMPLANT
GLOVE BIOGEL PI IND STRL 8 (GLOVE) ×1 IMPLANT
GLOVE BIOGEL PI INDICATOR 6.5 (GLOVE) ×1
GLOVE BIOGEL PI INDICATOR 7.0 (GLOVE) ×1
GLOVE BIOGEL PI INDICATOR 8 (GLOVE) ×1
GLOVE SURG SS PI 6.5 STRL IVOR (GLOVE) ×2 IMPLANT
GOWN STRL REUS W/ TWL LRG LVL3 (GOWN DISPOSABLE) ×2 IMPLANT
GOWN STRL REUS W/ TWL XL LVL3 (GOWN DISPOSABLE) ×1 IMPLANT
GOWN STRL REUS W/TWL LRG LVL3 (GOWN DISPOSABLE) ×6
GOWN STRL REUS W/TWL XL LVL3 (GOWN DISPOSABLE) ×2
IMPL IFUSE 7.0X55MM (Rod) IMPLANT
IMPLANT IFUSE 7.0MMX40MM (Rod) ×4 IMPLANT
IMPLANT IFUSE 7.0X55MM (Rod) ×2 IMPLANT
KIT BASIN OR (CUSTOM PROCEDURE TRAY) ×2 IMPLANT
KIT TURNOVER KIT B (KITS) ×2 IMPLANT
MANIFOLD NEPTUNE II (INSTRUMENTS) ×1 IMPLANT
NDL HYPO 25GX1X1/2 BEV (NEEDLE) ×1 IMPLANT
NEEDLE 22X1 1/2 (OR ONLY) (NEEDLE) ×2 IMPLANT
NEEDLE HYPO 25GX1X1/2 BEV (NEEDLE) ×2 IMPLANT
NS IRRIG 1000ML POUR BTL (IV SOLUTION) ×2 IMPLANT
PACK UNIVERSAL I (CUSTOM PROCEDURE TRAY) ×2 IMPLANT
PAD ARMBOARD 7.5X6 YLW CONV (MISCELLANEOUS) ×4 IMPLANT
PENCIL BUTTON HOLSTER BLD 10FT (ELECTRODE) ×2 IMPLANT
PIN PUSH 3.2MM (PIN) ×1 IMPLANT
PIN STEINMAN (PIN) ×2 IMPLANT
PIN STEINMAN 3.2MM (PIN) ×3 IMPLANT
SPONGE LAP 18X18 RF (DISPOSABLE) ×2 IMPLANT
STAPLER VISISTAT 35W (STAPLE) ×2 IMPLANT
STRIP CLOSURE SKIN 1/2X4 (GAUZE/BANDAGES/DRESSINGS) ×2 IMPLANT
SUT MNCRL AB 4-0 PS2 18 (SUTURE) ×2 IMPLANT
SUT VIC AB 0 CT1 18XCR BRD 8 (SUTURE) IMPLANT
SUT VIC AB 0 CT1 8-18 (SUTURE)
SUT VIC AB 1 CT1 18XCR BRD 8 (SUTURE) ×1 IMPLANT
SUT VIC AB 1 CT1 8-18 (SUTURE) ×2
SUT VIC AB 2-0 CT2 18 VCP726D (SUTURE) ×2 IMPLANT
SYR BULB IRRIG 60ML STRL (SYRINGE) ×4 IMPLANT
SYR CONTROL 10ML LL (SYRINGE) ×2 IMPLANT
SYS SPNL FX3ANG 40X7XFSN (Rod) ×2 IMPLANT
SYSTEM SPNL FX3ANG 40X7XFSN (Rod) IMPLANT
TAPE CLOTH SURG 4X10 WHT LF (GAUZE/BANDAGES/DRESSINGS) ×1 IMPLANT
TOWEL GREEN STERILE (TOWEL DISPOSABLE) ×4 IMPLANT
TOWEL GREEN STERILE FF (TOWEL DISPOSABLE) ×1 IMPLANT
TUBE CONNECTING 12X1/4 (SUCTIONS) ×2 IMPLANT
WATER STERILE IRR 1000ML POUR (IV SOLUTION) ×2 IMPLANT
YANKAUER SUCT BULB TIP NO VENT (SUCTIONS) ×2 IMPLANT

## 2019-11-19 NOTE — H&P (Signed)
PREOPERATIVE H&P  Chief Complaint: Left low back pain  HPI: [name] is a 59 y.o. female who presents with ongoing pain in the left low back  Patient's pain is c/w left SI dysfunction  Patient has failed multiple forms of conservative care and continues to have pain (see office notes for additional details regarding the patient's full course of treatment)  Past Medical History:  Diagnosis Date   Allergy    Anxiety    Arthritis    Back pain, chronic    Bipolar disorder (HCC)    denies   Bronchitis    hx-no inhalers at home   Carotid bruit    Carpal tunnel syndrome on both sides    Cataract    Complication of anesthesia    "back surgery, didn't wake up for 4 days."   COPD (chronic obstructive pulmonary disease) ([place])    Coronary artery disease    DDD (degenerative disc disease), lumbar    Depression    Diabetes mellitus    type II   Ejection fraction    .   GERD (gastroesophageal reflux disease)    Hx of CABG    Hypercholesteremia    Hypertension    Hypothyroidism    Multiple thyroid nodules    Myocardial infarction ([place]) 11/20/06   "4 in one section"   Neuromuscular disorder ([place])    Nodule of neck    PTSD (post-traumatic stress disorder)    Past Surgical History:  Procedure Laterality Date   ABDOMINAL HYSTERECTOMY  1984   BACK SURGERY     fusion T4- T-5   BREAST EXCISIONAL BIOPSY Right    benign   BREAST EXCISIONAL BIOPSY Left    benign   COLONOSCOPY     CORONARY ARTERY BYPASS GRAFT  2008   KNEE SURGERY Left 2009   Menisus tear   ORIF ANKLE FRACTURE Right 07/12/2012   Procedure: OPEN REDUCTION INTERNAL FIXATION (ORIF) RIGHT ANKLE FRACTURE;  Surgeon: Alta Corning, MD;  Location: [facility];  Service: Orthopedics;  Laterality: Right;   SHOULDER SURGERY Right 11/08/2016   rotator cuff   TUBAL LIGATION     WRIST SURGERY Left    Social History   Socioeconomic History   Marital  status: Married    Spouse name: Not on file   Number of children: 2   Years of education: Not on file   Highest education level: 11th grade  Occupational History   Not on file  Tobacco Use   Smoking status: Current Every Day Smoker    Packs/day: 1.00    Years: 33.00    Pack years: 33.00    Types: Cigarettes   Smokeless tobacco: Never Used  Vaping Use   Vaping Use: Never used  Substance and Sexual Activity   Alcohol use: No   Drug use: No   Sexual activity: Not on file  Other Topics Concern   Not on file  Social History Narrative   Not on file   Social Determinants of Health   Financial Resource Strain:    Difficulty of Paying Living Expenses:   Food Insecurity:    Worried About Charity fundraiser in the Last Year:    Arboriculturist in the Last Year:   Transportation Needs:    Film/video editor (Medical):    Lack of Transportation (Non-Medical):   Physical Activity:    Days of Exercise per Week:    Minutes of Exercise  per Session:   Stress:    Feeling of Stress :   Social Connections:    Frequency of Communication with Friends and Family:    Frequency of Social Gatherings with Friends and Family:    Attends Religious Services:    Active Member of Clubs or Organizations:    Attends Music therapist:    Marital Status:    Family History  Problem Relation Age of Onset   Heart disease Mother    Hyperlipidemia Mother    Hypertension Mother    Heart disease Father    Stroke Father    Hypertension Father    Hyperlipidemia Father    Hypertension Sister    Hypertension Brother    Cancer Daughter        53 different kinds of cancer   Hypertension Maternal Grandmother    Hypertension Maternal Grandfather    Cancer Maternal Grandfather    Hypertension Paternal Grandmother    Hypertension Paternal Grandfather    Cancer Maternal Aunt        breast and ovarian   Cancer Paternal Aunt        breast    Colon cancer Neg Hx    Esophageal cancer Neg Hx    Rectal cancer Neg Hx    Stomach cancer Neg Hx    Liver cancer Neg Hx    Allergies  Allergen Reactions   Latuda [Lurasidone Hcl] Shortness Of Breath   Coconut Flavor Swelling and Rash   Codeine Nausea And Vomiting    "I get deathly sick , vomitting"   Ibuprofen Swelling and Rash   Morphine And Related Nausea And Vomiting   Prednisone Swelling and Rash   Cyclobenzaprine     "almost made heart stop, couldn't breathe"   Meloxicam Itching   Morphine Nausea And Vomiting   Prior to Admission medications   Medication Sig Start Date End Date Taking? Authorizing Provider  acarbose (PRECOSE) 50 MG tablet Take 1 tablet (50 mg total) by mouth 3 (three) times daily with meals. 08/28/19  Yes Renato Shin, MD  Blood Glucose Calibration (ACCU-CHEK GUIDE CONTROL) LIQD 1 each by Does not apply route as needed. Use to calibrate glucometer prn; E11.9 07/19/18  Yes Renato Shin, MD  Bromocriptine Mesylate (CYCLOSET) 0.8 MG TABS Take 1 tablet (0.8 mg total) by mouth daily. 07/10/19  Yes Renato Shin, MD  carvedilol (COREG) 3.125 MG tablet TAKE 1 TABLET TWICE DAILY Patient taking differently: Take 3.125 mg by mouth 2 (two) times daily with a meal. TAKE 1 TABLET TWICE DAILY 04/28/19  Yes Rutherford Guys, MD  docusate sodium (COLACE) 100 MG capsule Take 100 mg by mouth 2 (two) times daily.   Yes [provider]  DULoxetine (CYMBALTA) 60 MG capsule Take 60 mg by mouth 2 (two) times daily.   Yes [provider]  empagliflozin (JARDIANCE) 25 MG TABS tablet Take 25 mg by mouth daily. 06/17/19  Yes Renato Shin, MD  ezetimibe (ZETIA) 10 MG tablet Take 1 tablet (10 mg total) by mouth daily. Please make overdue appt with Dr. Acie Fredrickson before anymore refills. 1st attempt 07/22/19  Yes Nahser, Wonda Cheng, MD  famotidine (PEPCID) 20 MG tablet Take 1 tablet (20 mg total) by mouth 2 (two) times daily. 11/24/18  Yes Rutherford Guys, MD    fenofibrate (TRICOR) 145 MG tablet Take 1 tablet (145 mg total) by mouth daily. 01/22/19  Yes Nahser, Wonda Cheng, MD  fluticasone (FLONASE) 50 MCG/ACT nasal spray Place 1 spray into  both nostrils at bedtime.   Yes [provider]  glucose blood (ACCU-CHEK GUIDE) test strip Use to check blood sugar 3 times daily. 07/22/18  Yes Renato Shin, MD  Lancets Misc. (ACCU-CHEK FASTCLIX LANCET) KIT 1 each by Does not apply route 3 (three) times daily. Use to monitor glucose levels TID; E11.9 07/19/18  Yes Renato Shin, MD  levothyroxine (SYNTHROID) 175 MCG tablet Take 175 mcg by mouth daily before breakfast.   Yes [provider]  loratadine (CLARITIN) 10 MG tablet Take 1 tablet (10 mg total) by mouth daily. 09/25/18  Yes Rutherford Guys, MD  metFORMIN (GLUCOPHAGE) 1000 MG tablet Take 1 tablet (1,000 mg total) by mouth 2 (two) times daily with a meal. 07/03/19  Yes Renato Shin, MD  Omega-3 Fatty Acids (FISH OIL) 1200 MG CAPS Take 1,200 mg by mouth daily.    Yes [provider]  omeprazole (PRILOSEC) 40 MG capsule TAKE 1 CAPSULE DAILY 30 MINUTES BEFORE A MEAL Patient taking differently: Take 40 mg by mouth 3 (three) times daily before meals. TAKE 1 CAPSULE DAILY 30 MINUTES BEFORE A MEAL 09/16/18  Yes Rutherford Guys, MD  Oxycodone HCl 10 MG TABS Take 1 tablet by mouth every 4 (four) hours as needed (pain).  07/04/18  Yes [provider]  pregabalin (LYRICA) 150 MG capsule Take 100 mg by mouth 3 (three) times daily.  07/04/18  Yes [provider]  promethazine-dextromethorphan (PROMETHAZINE-DM) 6.25-15 MG/5ML syrup Take 5 mLs by mouth 3 (three) times daily as needed for cough.  07/24/19  Yes [provider]  rosuvastatin (CRESTOR) 40 MG tablet Take 40 mg by mouth daily.   Yes [provider]  tiZANidine (ZANAFLEX) 4 MG tablet Take 4 mg by mouth every 6 (six) hours as needed for muscle spasms.   Yes [provider]  Vitamin D, Ergocalciferol,  (DRISDOL) 1.25 MG (50000 UNIT) CAPS capsule Take 50,000 Units by mouth every 7 (seven) days. Saturdays   Yes [provider]  aspirin EC 81 MG tablet Take 1 tablet (81 mg total) by mouth daily. Patient not taking: Reported on 11/06/2019 07/09/18   Nahser, Wonda Cheng, MD  diazepam (VALIUM) 5 MG tablet Take 1 tablet (5 mg total) by mouth every 12 (twelve) hours as needed for anxiety or muscle spasms. **NEED OFFICE VISIT FOR REFILL** Patient not taking: Reported on 11/06/2019 02/23/18   Shawnee Knapp, MD  fluticasone [facility]) 50 MCG/ACT nasal spray Place 1 spray into both nostrils 2 (two) times daily. Patient not taking: Reported on 11/06/2019 06/24/18   Rutherford Guys, MD  Ipratropium-Albuterol (COMBIVENT RESPIMAT) 20-100 MCG/ACT AERS respimat Inhale 1 puff into the lungs every 6 (six) hours. Patient taking differently: Inhale 1 puff into the lungs every 6 (six) hours as needed for wheezing or shortness of breath.  05/10/17   Shawnee Knapp, MD  Methylnaltrexone Bromide (RELISTOR) 150 MG TABS Take 3 tablets by mouth daily. With water >30 min before first mea Patient not taking: Reported on 11/06/2019 11/11/18   Rutherford Guys, MD  traZODone (DESYREL) 100 MG tablet Take 1 tablet (100 mg total) by mouth at bedtime as needed for sleep. Patient not taking: Reported on 11/06/2019 11/24/16   Shawnee Knapp, MD     All other systems have been reviewed and were otherwise negative with the exception of those mentioned in the HPI and as above.  Physical Exam: Vitals:   11/19/19 1018  BP: 131/65  Pulse: 61  Resp: 20  Temp: 98.2 F (36.8 C)  SpO2: 95%    Body mass index is 27.81 kg/m.  General: Alert, no acute distress Cardiovascular: No pedal edema Respiratory: No cyanosis, no use of accessory musculature Skin: No lesions in the area of chief complaint Neurologic: Sensation intact distally Psychiatric: Patient is competent for consent with normal mood and affect Lymphatic: No axillary or cervical  lymphadenopathy  Assessment/Plan: LEFT SACROILIAC JOINT DYSFUNCTION Plan for Procedure(s): LEFT SACROILIAC [place], MD 11/19/2019 12:51 PM

## 2019-11-19 NOTE — Op Note (Signed)
NAME: [name]     MEDICAL RECORD NO.:  [id number]   DATE OF BIRTH:  [birth year]   DATE OF PROCEDURE:  11/19/2019                               OPERATIVE REPORT     PREOPERATIVE DIAGNOSES: 1.  Left sacroiliac joint dysfunction   POSTOPERATIVE DIAGNOSES: 1.  Left sacroiliac joint dysfunction   PROCEDURES: Minimally invasive left sacroiliac joint fusion   SURGEON:  Phylliss Bob, MD.   ASSISTANTPricilla Holm, PA-C.   ANESTHESIA:  General endotracheal anesthesia.   COMPLICATIONS:  None.   DISPOSITION:  Stable.   ESTIMATED BLOOD LOSS:  Minimal.   INDICATIONS FOR SURGERY:  Briefly, [name] is a pleasant 59 year old female, who did present to me with ongoing severe pain at the left side of her low back.  Her work-up was diagnostic for left sacroiliac joint dysfunction.  She did fail appropriate conservative treatment measures, and as such, we did discuss proceeding with the surgery noted above.  The patient was made fully aware of the risks and recovery period associated with surgery, and she did wish to proceed.   OPERATIVE DETAILS:  On 11/19/2019, the patient was brought to surgery and general endotracheal anesthesia was administered.  The patient was placed prone on a flat Jackson bed, with gelrolls placed beneath the patient's chest and hips. The region of the left buttock was prepped and draped in the usual sterile fashion.  A timeout procedure was performed.  Fluoroscopy was brought into the field.  I was able to ensure adequate lateral, inlet, and outlet radiographs.  At this point, a 3 cm incision was made in line with the posterior border of the sacrum on the left.  3 guidewires were advanced across the sacroiliac joint on the left side.  Fluoroscopy was liberally used while advancing the guidewires in order to ensure a safe trajectory of the guidewires..  I then drilled and broached over the guidewires.  At this point, 7 mm implants were advanced across the  sacroiliac joints from superiorly to inferiorly, the length of the implants were 55 mm, 40 mm, and 40 mm.  I was very pleased with the resting position of the implants on lateral, inlet, and outlet fluoroscopy.  The guidewires were then removed.  I was very pleased with the final lateral, inlet, and outlet radiographs.  At this point, the wound was copiously irrigated and closed in layers, using #1 Vicryl, followed by 2-0 Vicryl, followed by 4-0 Monocryl.  All instrument counts were correct at the termination of the procedure   Of note, Pricilla Holm was my assistant throughout surgery, and did aid in retraction, suctioning, placement of the hardware, and closure from start to finish.     Phylliss Bob, MD

## 2019-11-19 NOTE — Transfer of Care (Signed)
Immediate Anesthesia Transfer of Care Note  Patient: [name]  Procedure(s) Performed: LEFT SACROILIAC JOINT FUSION (Left Spine Lumbar)  Patient Location: PACU  Anesthesia Type:General  Level of Consciousness: awake, alert  and oriented  Airway & Oxygen Therapy: Patient Spontanous Breathing and Patient connected to face mask oxygen  Post-op Assessment: Report given to RN and Post -op Vital signs reviewed and stable  Post vital signs: Reviewed and stable  Last Vitals:  Vitals Value Taken Time  BP 142/92 11/19/19 1429  Temp    Pulse 76 11/19/19 1430  Resp 12 11/19/19 1430  SpO2 98 % 11/19/19 1430  Vitals shown include unvalidated device data.  Last Pain:  Vitals:   11/19/19 1059  TempSrc:   PainSc: 6       Patients Stated Pain Goal: 6 (48/62/82 4175)  Complications: No complications documented.

## 2019-11-19 NOTE — Anesthesia Postprocedure Evaluation (Signed)
Anesthesia Post Note  Patient: [name]  Procedure(s) Performed: LEFT SACROILIAC JOINT FUSION (Left Spine Lumbar)     Patient location during evaluation: PACU Anesthesia Type: General Level of consciousness: awake and alert Pain management: pain level controlled Vital Signs Assessment: post-procedure vital signs reviewed and stable Respiratory status: spontaneous breathing, nonlabored ventilation, respiratory function stable and patient connected to nasal cannula oxygen Cardiovascular status: blood pressure returned to baseline and stable Postop Assessment: no apparent nausea or vomiting Anesthetic complications: no   No complications documented.  Last Vitals:  Vitals:   11/19/19 1512 11/19/19 1515  BP:  105/68  Pulse: 70 72  Resp: 18 20  Temp: 36.7 C   SpO2: 95%     Last Pain:  Vitals:   11/19/19 1512  TempSrc:   PainSc: 4                  Tor Tsuda DAVID

## 2019-11-19 NOTE — Anesthesia Procedure Notes (Signed)
Procedure Name: Intubation Date/Time: 11/19/2019 1:08 PM Performed by: Trinna Post., CRNA Pre-anesthesia Checklist: Patient identified, Emergency Drugs available, Suction available, Patient being monitored and Timeout performed Patient Re-evaluated:Patient Re-evaluated prior to induction Oxygen Delivery Method: Circle system utilized Preoxygenation: Pre-oxygenation with 100% oxygen Induction Type: IV induction Ventilation: Mask ventilation without difficulty Laryngoscope Size: Mac and 3 Grade View: Grade I Tube type: Oral Tube size: 7.0 mm Number of attempts: 1 Airway Equipment and Method: Stylet Placement Confirmation: ETT inserted through vocal cords under direct vision,  positive ETCO2 and breath sounds checked- equal and bilateral Secured at: 22 cm Tube secured with: Tape Dental Injury: Teeth and Oropharynx as per pre-operative assessment

## 2019-11-20 ENCOUNTER — Encounter (HOSPITAL_COMMUNITY): Payer: Self-pay | Admitting: Orthopedic Surgery

## 2019-12-05 ENCOUNTER — Other Ambulatory Visit: Payer: Self-pay | Admitting: Cardiovascular Disease

## 2020-02-02 ENCOUNTER — Other Ambulatory Visit: Payer: Self-pay | Admitting: Nurse Practitioner

## 2020-02-02 DIAGNOSIS — Z1231 Encounter for screening mammogram for malignant neoplasm of breast: Secondary | ICD-10-CM

## 2020-02-05 ENCOUNTER — Ambulatory Visit
Admission: RE | Admit: 2020-02-05 | Discharge: 2020-02-05 | Disposition: A | Discharge status: Home / Self Care | Payer: Medicare HMO | Source: Ambulatory Visit

## 2020-02-05 ENCOUNTER — Other Ambulatory Visit: Payer: Self-pay

## 2020-02-05 DIAGNOSIS — Z1231 Encounter for screening mammogram for malignant neoplasm of breast: Secondary | ICD-10-CM

## 2020-02-18 ENCOUNTER — Other Ambulatory Visit: Payer: Self-pay

## 2020-02-18 DIAGNOSIS — M25511 Pain in right shoulder: Secondary | ICD-10-CM

## 2020-02-26 ENCOUNTER — Other Ambulatory Visit: Payer: Self-pay | Admitting: Orthopedic Surgery

## 2020-02-26 DIAGNOSIS — M545 Low back pain, unspecified: Secondary | ICD-10-CM

## 2020-03-09 ENCOUNTER — Other Ambulatory Visit: Payer: Self-pay

## 2020-03-09 ENCOUNTER — Ambulatory Visit
Admission: RE | Admit: 2020-03-09 | Discharge: 2020-03-09 | Disposition: A | Discharge status: Home / Self Care | Payer: Medicare HMO | Source: Ambulatory Visit | Attending: Orthopedic Surgery | Admitting: Orthopedic Surgery

## 2020-03-09 DIAGNOSIS — M545 Low back pain, unspecified: Secondary | ICD-10-CM

## 2020-03-10 ENCOUNTER — Ambulatory Visit
Admission: RE | Admit: 2020-03-10 | Discharge: 2020-03-10 | Disposition: A | Discharge status: Home / Self Care | Payer: Medicare HMO | Source: Ambulatory Visit | Attending: Orthopedic Surgery | Admitting: Orthopedic Surgery

## 2020-03-10 DIAGNOSIS — M25511 Pain in right shoulder: Secondary | ICD-10-CM

## 2020-03-12 ENCOUNTER — Other Ambulatory Visit: Payer: Self-pay | Admitting: Endocrinology

## 2020-03-12 DIAGNOSIS — E1151 Type 2 diabetes mellitus with diabetic peripheral angiopathy without gangrene: Secondary | ICD-10-CM

## 2020-03-15 ENCOUNTER — Other Ambulatory Visit: Payer: Medicare HMO

## 2020-03-22 ENCOUNTER — Other Ambulatory Visit: Payer: Self-pay | Admitting: Orthopedic Surgery

## 2020-03-23 ENCOUNTER — Other Ambulatory Visit: Payer: Self-pay | Admitting: Orthopedic Surgery

## 2020-03-23 DIAGNOSIS — M25511 Pain in right shoulder: Secondary | ICD-10-CM

## 2020-04-02 NOTE — Progress Notes (Signed)
Need orders in epic Preop on 04/06/2020

## 2020-04-06 ENCOUNTER — Encounter (HOSPITAL_COMMUNITY)
Admission: RE | Admit: 2020-04-06 | Discharge: 2020-04-06 | Disposition: A | Discharge status: Home / Self Care | Payer: Medicare HMO | Source: Ambulatory Visit | Attending: Orthopedic Surgery | Admitting: Orthopedic Surgery

## 2020-04-06 ENCOUNTER — Other Ambulatory Visit: Payer: Self-pay

## 2020-04-06 ENCOUNTER — Encounter (HOSPITAL_COMMUNITY): Payer: Self-pay

## 2020-04-06 ENCOUNTER — Other Ambulatory Visit: Payer: Self-pay | Admitting: Orthopedic Surgery

## 2020-04-06 DIAGNOSIS — Z01812 Encounter for preprocedural laboratory examination: Secondary | ICD-10-CM | POA: Insufficient documentation

## 2020-04-06 HISTORY — DX: Sleep apnea, unspecified: G47.30

## 2020-04-06 LAB — CBC
HCT: 50.8 % — ABNORMAL HIGH (ref 36.0–46.0)
Hemoglobin: 16 g/dL — ABNORMAL HIGH (ref 12.0–15.0)
MCH: 26.8 pg (ref 26.0–34.0)
MCHC: 31.5 g/dL (ref 30.0–36.0)
MCV: 85.2 fL (ref 80.0–100.0)
Platelets: 261 10*3/uL (ref 150–400)
RBC: 5.96 MIL/uL — ABNORMAL HIGH (ref 3.87–5.11)
RDW: 15.9 % — ABNORMAL HIGH (ref 11.5–15.5)
WBC: 8.6 10*3/uL (ref 4.0–10.5)
nRBC: 0 % (ref 0.0–0.2)

## 2020-04-06 LAB — SURGICAL PCR SCREEN
MRSA, PCR: NEGATIVE
Staphylococcus aureus: NEGATIVE

## 2020-04-06 LAB — HEMOGLOBIN A1C
Hgb A1c MFr Bld: 7 % — ABNORMAL HIGH (ref 4.8–5.6)
Mean Plasma Glucose: 154.2 mg/dL

## 2020-04-06 LAB — BASIC METABOLIC PANEL
Anion gap: 10 (ref 5–15)
BUN: 12 mg/dL (ref 6–20)
CO2: 26 mmol/L (ref 22–32)
Calcium: 9.1 mg/dL (ref 8.9–10.3)
Chloride: 105 mmol/L (ref 98–111)
Creatinine, Ser: 0.56 mg/dL (ref 0.44–1.00)
GFR, Estimated: >60 mL/min (ref >60)
Glucose, Bld: 109 mg/dL — ABNORMAL HIGH (ref 70–99)
Potassium: 4.1 mmol/L (ref 3.5–5.1)
Sodium: 141 mmol/L (ref 135–145)

## 2020-04-06 LAB — GLUCOSE, CAPILLARY: Glucose-Capillary: 145 mg/dL — ABNORMAL HIGH (ref 70–99)

## 2020-04-06 NOTE — Progress Notes (Signed)
DUE TO COVID-19 ONLY ONE VISITOR IS ALLOWED TO COME WITH YOU AND STAY IN THE WAITING ROOM ONLY DURING PRE OP AND PROCEDURE DAY OF SURGERY. THE 1 VISITOR  MAY VISIT WITH YOU AFTER SURGERY IN YOUR PRIVATE ROOM DURING VISITING HOURS ONLY!  YOU NEED TO HAVE A COVID 19 TEST ON_______ @_______ , THIS TEST MUST BE DONE BEFORE SURGERY,  COVID TESTING SITE [address] [place] [address], IT IS ON THE RIGHT GOING OUT [address] APPROXIMATELY  2 MINUTES PAST [employer] ON THE RIGHT. ONCE YOUR COVID TEST IS COMPLETED,  PLEASE BEGIN THE QUARANTINE INSTRUCTIONS AS OUTLINED IN YOUR HANDOUT.                [name]  04/06/2020   Your procedure is scheduled on: 04/15/2020    Report to [facility] Main  Entrance   Report to admitting at   0730am  AM     Call this number if you have problems the morning of surgery [phone]    Remember: Do not eat food , candy gum or mints :After Midnight. You may have clear liquids from midnight until 0655am     CLEAR LIQUID DIET   Foods Allowed                                                                       Coffee and tea, regular and decaf                              Plain Jell-O any favor except red or purple                                            Fruit ices (not with fruit pulp)                                      Iced Popsicles                                     Carbonated beverages, regular and diet                                    Cranberry, grape and apple juices Sports drinks like Gatorade Lightly seasoned clear broth or consume(fat free) Sugar, honey syrup   _____________________________________________________________________    BRUSH YOUR TEETH MORNING OF SURGERY AND RINSE YOUR MOUTH OUT, NO CHEWING GUM CANDY OR MINTS.     Take these medicines the morning of surgery with A SIP OF WATER:  Lyrica, claritin, synthroid, coreg, cymbalta, pepcid, inhalers as uaual and bring, prilosec    DO NOT TAKE ANY DIABETIC MEDICATIONS DAY OF YOUR SURGERY  You may not have any metal on your body including hair pins and              piercings  Do not wear jewelry, make-up, lotions, powders or perfumes, deodorant             Do not wear nail polish on your fingernails.  Do not shave  48 hours prior to surgery.              Men may shave face and neck.   Do not bring valuables to the hospital. [place].  Contacts, dentures or bridgework may not be worn into surgery.  Leave suitcase in the car. After surgery it may be brought to your room.     Patients discharged the day of surgery will not be allowed to drive home. IF YOU ARE HAVING SURGERY AND GOING HOME THE SAME DAY, YOU MUST HAVE AN ADULT TO DRIVE YOU HOME AND BE WITH YOU FOR 24 HOURS. YOU MAY GO HOME BY TAXI OR UBER OR ORTHERWISE, BUT AN ADULT MUST ACCOMPANY YOU HOME AND STAY WITH YOU FOR 24 HOURS.  Name and phone number of your driver:  Special Instructions: N/A              Please read over the following fact sheets you were given: _____________________________________________________________________  [facility] - Preparing for Surgery Before surgery, you can play an important role.  Because skin is not sterile, your skin needs to be as free of germs as possible.  You can reduce the number of germs on your skin by washing with CHG (chlorahexidine gluconate) soap before surgery.  CHG is an antiseptic cleaner which kills germs and bonds with the skin to continue killing germs even after washing. Please DO NOT use if you have an allergy to CHG or antibacterial soaps.  If your skin becomes reddened/irritated stop using the CHG and inform your nurse when you arrive at Short Stay. Do not shave (including legs and underarms) for at least 48 hours prior to the first CHG shower.  You may shave your face/neck. Please follow these instructions carefully:  1.   Shower with CHG Soap the night before surgery and the  morning of Surgery.  2.  If you choose to wash your hair, wash your hair first as usual with your  normal  shampoo.  3.  After you shampoo, rinse your hair and body thoroughly to remove the  shampoo.                           4.  Use CHG as you would any other liquid soap.  You can apply chg directly  to the skin and wash                       Gently with a scrungie or clean washcloth.  5.  Apply the CHG Soap to your body ONLY FROM THE NECK DOWN.   Do not use on face/ open                           Wound or open sores. Avoid contact with eyes, ears mouth and genitals (private parts).  Wash face,  Genitals (private parts) with your normal soap.             6.  Wash thoroughly, paying special attention to the area where your surgery  will be performed.  7.  Thoroughly rinse your body with warm water from the neck down.  8.  DO NOT shower/wash with your normal soap after using and rinsing off  the CHG Soap.                9.  Pat yourself dry with a clean towel.            10.  Wear clean pajamas.            11.  Place clean sheets on your bed the night of your first shower and do not  sleep with pets. Day of Surgery : Do not apply any lotions/deodorants the morning of surgery.  Please wear clean clothes to the hospital/surgery center.  FAILURE TO FOLLOW THESE INSTRUCTIONS MAY RESULT IN THE CANCELLATION OF YOUR SURGERY PATIENT SIGNATURE_________________________________  NURSE SIGNATURE__________________________________  ________________________________________________________________________

## 2020-04-06 NOTE — Progress Notes (Addendum)
Anesthesia Review:  PCP: [facility] - have requested most recent office visit note  04/02/2020 office visit note on  Chart and labs Cardiologist : Elma [phone]- have requested most recent office visit note, echo and stress test if available  11/17/2019- Office visit note - Dr Brigitte Pulse on chart  reqeuested surgeon office to fax cardiac clearance- they are to send  Chest x-ray : EKG :11/17/2019  Echo : 07/16/19 on chart  Stress test: Cardiac Cath :  Activity level: cannot do a flight of stairs without difficulty  Sleep Study/ CPAP :cpa- to bring mask and tubing Fasting Blood Sugar :      / Checks Blood Sugar -- times a day:   Blood Thinner/ Instructions /Last Dose: ASA / Instructions/ Last Dose :  Smoker  HGBA1c-04/06/2020-7.0 DM- type 2 checks glucose sporadically  Orders not in at time of proep appt .  Reminded surgery scheduler, Bethena Roys that orders were not in at preop and this is 3rd time requesting .

## 2020-04-12 ENCOUNTER — Other Ambulatory Visit: Payer: Self-pay

## 2020-04-12 ENCOUNTER — Ambulatory Visit
Admission: RE | Admit: 2020-04-12 | Discharge: 2020-04-12 | Disposition: A | Discharge status: Home / Self Care | Payer: Medicare HMO | Source: Ambulatory Visit | Attending: Orthopedic Surgery | Admitting: Orthopedic Surgery

## 2020-04-12 ENCOUNTER — Other Ambulatory Visit (HOSPITAL_COMMUNITY)
Admission: RE | Admit: 2020-04-12 | Discharge: 2020-04-12 | Disposition: A | Discharge status: Home / Self Care | Payer: Medicare HMO | Source: Ambulatory Visit | Attending: Orthopedic Surgery | Admitting: Orthopedic Surgery

## 2020-04-12 DIAGNOSIS — M25511 Pain in right shoulder: Secondary | ICD-10-CM

## 2020-04-12 DIAGNOSIS — Z01812 Encounter for preprocedural laboratory examination: Secondary | ICD-10-CM | POA: Insufficient documentation

## 2020-04-12 DIAGNOSIS — Z20822 Contact with and (suspected) exposure to covid-19: Secondary | ICD-10-CM | POA: Diagnosis not present

## 2020-04-13 LAB — SARS CORONAVIRUS 2 (TAT 6-24 HRS): SARS Coronavirus 2: NEGATIVE

## 2020-04-15 ENCOUNTER — Ambulatory Visit (HOSPITAL_COMMUNITY): Payer: Medicare HMO

## 2020-04-15 ENCOUNTER — Other Ambulatory Visit: Payer: Self-pay

## 2020-04-15 ENCOUNTER — Ambulatory Visit (HOSPITAL_COMMUNITY): Payer: Medicare HMO | Admitting: Registered Nurse

## 2020-04-15 ENCOUNTER — Observation Stay (HOSPITAL_COMMUNITY)
Admission: RE | Admit: 2020-04-15 | Discharge: 2020-04-16 | Disposition: A | Discharge status: Home / Self Care | Payer: Medicare HMO | Source: Ambulatory Visit | Attending: Orthopedic Surgery | Admitting: Orthopedic Surgery

## 2020-04-15 ENCOUNTER — Encounter (HOSPITAL_COMMUNITY): Payer: Self-pay | Admitting: Orthopedic Surgery

## 2020-04-15 ENCOUNTER — Ambulatory Visit (HOSPITAL_COMMUNITY): Payer: Medicare HMO | Admitting: Physician Assistant

## 2020-04-15 ENCOUNTER — Encounter (HOSPITAL_COMMUNITY)
Admission: RE | Disposition: A | Discharge status: Home / Self Care | Payer: Self-pay | Source: Ambulatory Visit | Attending: Orthopedic Surgery

## 2020-04-15 DIAGNOSIS — Z951 Presence of aortocoronary bypass graft: Secondary | ICD-10-CM | POA: Insufficient documentation

## 2020-04-15 DIAGNOSIS — I10 Essential (primary) hypertension: Secondary | ICD-10-CM | POA: Insufficient documentation

## 2020-04-15 DIAGNOSIS — Z01811 Encounter for preprocedural respiratory examination: Secondary | ICD-10-CM

## 2020-04-15 DIAGNOSIS — M25511 Pain in right shoulder: Secondary | ICD-10-CM | POA: Diagnosis present

## 2020-04-15 DIAGNOSIS — E119 Type 2 diabetes mellitus without complications: Secondary | ICD-10-CM | POA: Insufficient documentation

## 2020-04-15 DIAGNOSIS — Z79899 Other long term (current) drug therapy: Secondary | ICD-10-CM | POA: Diagnosis not present

## 2020-04-15 DIAGNOSIS — E039 Hypothyroidism, unspecified: Secondary | ICD-10-CM | POA: Diagnosis not present

## 2020-04-15 DIAGNOSIS — I2510 Atherosclerotic heart disease of native coronary artery without angina pectoris: Secondary | ICD-10-CM | POA: Diagnosis not present

## 2020-04-15 DIAGNOSIS — Z96611 Presence of right artificial shoulder joint: Secondary | ICD-10-CM

## 2020-04-15 DIAGNOSIS — F17210 Nicotine dependence, cigarettes, uncomplicated: Secondary | ICD-10-CM | POA: Diagnosis not present

## 2020-04-15 DIAGNOSIS — M87811 Other osteonecrosis, right shoulder: Principal | ICD-10-CM | POA: Insufficient documentation

## 2020-04-15 HISTORY — PX: SHOULDER HEMI-ARTHROPLASTY: SHX5049

## 2020-04-15 LAB — GLUCOSE, CAPILLARY
Glucose-Capillary: 115 mg/dL — ABNORMAL HIGH (ref 70–99)
Glucose-Capillary: 111 mg/dL — ABNORMAL HIGH (ref 70–99)
Glucose-Capillary: 207 mg/dL — ABNORMAL HIGH (ref 70–99)
Glucose-Capillary: 143 mg/dL — ABNORMAL HIGH (ref 70–99)

## 2020-04-15 LAB — URINALYSIS, ROUTINE W REFLEX MICROSCOPIC
Bacteria, UA: NONE SEEN
Bilirubin Urine: NEGATIVE
Glucose, UA: >=500 mg/dL — AB
Hgb urine dipstick: NEGATIVE
Ketones, ur: NEGATIVE mg/dL
Nitrite: NEGATIVE
Protein, ur: NEGATIVE mg/dL
Specific Gravity, Urine: 1.006 (ref 1.005–1.030)
pH: 7 (ref 5.0–8.0)

## 2020-04-15 LAB — TYPE AND SCREEN
ABO/RH(D): O POS
Antibody Screen: NEGATIVE

## 2020-04-15 SURGERY — HEMIARTHROPLASTY, SHOULDER
Anesthesia: General | Site: Shoulder | Laterality: Right

## 2020-04-15 MED ORDER — EMPAGLIFLOZIN-METFORMIN HCL 12.5-1000 MG PO TABS: 1.0000 | ORAL_TABLET | Freq: Two times a day (BID) | ORAL | Status: DC

## 2020-04-15 MED ORDER — BUPIVACAINE HCL (PF) 0.5 % IJ SOLN
INTRAMUSCULAR | Status: DC | PRN
  Administered 2020-04-15: 15 mL

## 2020-04-15 MED ORDER — ONDANSETRON HCL 4 MG PO TABS: 4.0000 mg | ORAL_TABLET | Freq: Four times a day (QID) | ORAL | Status: DC | PRN

## 2020-04-15 MED ORDER — PHENYLEPHRINE HCL-NACL 20-0.9 MG/250ML-% IV SOLN
INTRAVENOUS | Status: DC | PRN
  Administered 2020-04-15: 25 ug/min via INTRAVENOUS

## 2020-04-15 MED ORDER — METHOCARBAMOL 500 MG PO TABS: 500.0000 mg | ORAL_TABLET | Freq: Four times a day (QID) | ORAL | Status: DC | PRN

## 2020-04-15 MED ORDER — EZETIMIBE 10 MG PO TABS
10.0000 mg | ORAL_TABLET | Freq: Every day | ORAL | Status: DC
  Administered 2020-04-15: 10 mg via ORAL
  Filled 2020-04-15: qty 1

## 2020-04-15 MED ORDER — PHENOL 1.4 % MT LIQD: 1.0000 | OROMUCOSAL | Status: DC | PRN

## 2020-04-15 MED ORDER — TRANEXAMIC ACID-NACL 1000-0.7 MG/100ML-% IV SOLN
1000.0000 mg | INTRAVENOUS | Status: AC
  Administered 2020-04-15: 1000 mg via INTRAVENOUS
  Filled 2020-04-15: qty 100

## 2020-04-15 MED ORDER — SENNOSIDES-DOCUSATE SODIUM 8.6-50 MG PO TABS: 1.0000 | ORAL_TABLET | Freq: Every evening | ORAL | Status: DC | PRN

## 2020-04-15 MED ORDER — CEFAZOLIN SODIUM-DEXTROSE 1-4 GM/50ML-% IV SOLN
1.0000 g | Freq: Four times a day (QID) | INTRAVENOUS | Status: AC
  Administered 2020-04-15 – 2020-04-16 (×3): 1 g via INTRAVENOUS
  Filled 2020-04-15 (×3): qty 50

## 2020-04-15 MED ORDER — ONDANSETRON HCL 4 MG/2ML IJ SOLN
INTRAMUSCULAR | Status: DC | PRN
  Administered 2020-04-15: 4 mg via INTRAVENOUS

## 2020-04-15 MED ORDER — OXYCODONE HCL 5 MG PO TABS
5.0000 mg | ORAL_TABLET | ORAL | Status: DC | PRN
  Administered 2020-04-15 – 2020-04-16 (×2): 10 mg via ORAL
  Filled 2020-04-15 (×2): qty 2

## 2020-04-15 MED ORDER — CEFAZOLIN SODIUM-DEXTROSE 2-4 GM/100ML-% IV SOLN
2.0000 g | INTRAVENOUS | Status: AC
  Administered 2020-04-15: 2 g via INTRAVENOUS
  Filled 2020-04-15: qty 100

## 2020-04-15 MED ORDER — FAMOTIDINE 20 MG PO TABS
20.0000 mg | ORAL_TABLET | Freq: Two times a day (BID) | ORAL | Status: DC
Start: 2020-04-15 — End: 2020-04-16
  Administered 2020-04-15 – 2020-04-16 (×3): 20 mg via ORAL
  Filled 2020-04-15 (×3): qty 1

## 2020-04-15 MED ORDER — ONDANSETRON HCL 4 MG/2ML IJ SOLN: 4.0000 mg | Freq: Four times a day (QID) | INTRAMUSCULAR | Status: DC | PRN

## 2020-04-15 MED ORDER — FENTANYL CITRATE (PF) 100 MCG/2ML IJ SOLN: 25.0000 ug | INTRAMUSCULAR | Status: DC | PRN

## 2020-04-15 MED ORDER — OXYCODONE HCL 5 MG PO TABS
10.0000 mg | ORAL_TABLET | ORAL | Status: DC | PRN
  Administered 2020-04-15: 10 mg via ORAL
  Filled 2020-04-15: qty 2

## 2020-04-15 MED ORDER — ROCURONIUM BROMIDE 10 MG/ML (PF) SYRINGE
PREFILLED_SYRINGE | INTRAVENOUS | Status: DC | PRN
  Administered 2020-04-15: 70 mg via INTRAVENOUS

## 2020-04-15 MED ORDER — DULOXETINE HCL 60 MG PO CPEP
60.0000 mg | ORAL_CAPSULE | Freq: Two times a day (BID) | ORAL | Status: DC
  Administered 2020-04-15 – 2020-04-16 (×2): 60 mg via ORAL
  Filled 2020-04-15 (×2): qty 1

## 2020-04-15 MED ORDER — DEXAMETHASONE SODIUM PHOSPHATE 10 MG/ML IJ SOLN
INTRAMUSCULAR | Status: DC | PRN
  Administered 2020-04-15: 5 mg via INTRAVENOUS

## 2020-04-15 MED ORDER — DEXAMETHASONE SODIUM PHOSPHATE 10 MG/ML IJ SOLN
INTRAMUSCULAR | Status: AC
  Filled 2020-04-15: qty 1

## 2020-04-15 MED ORDER — ASPIRIN EC 325 MG PO TBEC
325.0000 mg | DELAYED_RELEASE_TABLET | Freq: Every day | ORAL | Status: DC
  Administered 2020-04-15 – 2020-04-16 (×2): 325 mg via ORAL
  Filled 2020-04-15 (×2): qty 1

## 2020-04-15 MED ORDER — ROSUVASTATIN CALCIUM 20 MG PO TABS
40.0000 mg | ORAL_TABLET | Freq: Every day | ORAL | Status: DC
  Administered 2020-04-15: 40 mg via ORAL
  Filled 2020-04-15 (×2): qty 2

## 2020-04-15 MED ORDER — METHOCARBAMOL 500 MG IVPB - SIMPLE MED
500.0000 mg | Freq: Four times a day (QID) | INTRAVENOUS | Status: DC | PRN
  Filled 2020-04-15: qty 50

## 2020-04-15 MED ORDER — METOCLOPRAMIDE HCL 5 MG PO TABS: 5.0000 mg | ORAL_TABLET | Freq: Three times a day (TID) | ORAL | Status: DC | PRN

## 2020-04-15 MED ORDER — FENTANYL CITRATE (PF) 100 MCG/2ML IJ SOLN
50.0000 ug | INTRAMUSCULAR | Status: DC
  Administered 2020-04-15: 50 ug via INTRAVENOUS
  Filled 2020-04-15: qty 2

## 2020-04-15 MED ORDER — WATER FOR IRRIGATION, STERILE IR SOLN
Status: DC | PRN
  Administered 2020-04-15: 2000 mL

## 2020-04-15 MED ORDER — FENTANYL CITRATE (PF) 100 MCG/2ML IJ SOLN
INTRAMUSCULAR | Status: AC
  Administered 2020-04-15: 25 ug via INTRAVENOUS
  Filled 2020-04-15: qty 2

## 2020-04-15 MED ORDER — ACETAMINOPHEN 10 MG/ML IV SOLN: 1000.0000 mg | Freq: Once | INTRAVENOUS | Status: DC | PRN

## 2020-04-15 MED ORDER — SODIUM CHLORIDE 0.9 % IV SOLN: INTRAVENOUS | Status: DC

## 2020-04-15 MED ORDER — SODIUM CHLORIDE 0.9 % IR SOLN
Status: DC | PRN
  Administered 2020-04-15: 1000 mL

## 2020-04-15 MED ORDER — BUPIVACAINE LIPOSOME 1.3 % IJ SUSP
INTRAMUSCULAR | Status: DC | PRN
  Administered 2020-04-15: 10 mL via PERINEURAL

## 2020-04-15 MED ORDER — ONDANSETRON HCL 4 MG/2ML IJ SOLN
INTRAMUSCULAR | Status: AC
  Filled 2020-04-15: qty 2

## 2020-04-15 MED ORDER — CARVEDILOL 3.125 MG PO TABS
3.1250 mg | ORAL_TABLET | Freq: Two times a day (BID) | ORAL | Status: DC
  Administered 2020-04-15 – 2020-04-16 (×2): 3.125 mg via ORAL
  Filled 2020-04-15 (×2): qty 1

## 2020-04-15 MED ORDER — DIPHENHYDRAMINE HCL 12.5 MG/5ML PO ELIX: 12.5000 mg | ORAL_SOLUTION | ORAL | Status: DC | PRN

## 2020-04-15 MED ORDER — INSULIN ASPART 100 UNIT/ML ~~LOC~~ SOLN
0.0000 [IU] | Freq: Three times a day (TID) | SUBCUTANEOUS | Status: DC
  Administered 2020-04-15: 5 [IU] via SUBCUTANEOUS

## 2020-04-15 MED ORDER — PROPOFOL 10 MG/ML IV BOLUS
INTRAVENOUS | Status: AC
  Filled 2020-04-15: qty 20

## 2020-04-15 MED ORDER — MIDAZOLAM HCL 2 MG/2ML IJ SOLN
1.0000 mg | INTRAMUSCULAR | Status: DC
  Administered 2020-04-15: 2 mg via INTRAVENOUS
  Filled 2020-04-15: qty 2

## 2020-04-15 MED ORDER — HYDROMORPHONE HCL 1 MG/ML IJ SOLN: 0.5000 mg | INTRAMUSCULAR | Status: DC | PRN

## 2020-04-15 MED ORDER — DOCUSATE SODIUM 100 MG PO CAPS
100.0000 mg | ORAL_CAPSULE | Freq: Two times a day (BID) | ORAL | Status: DC
  Administered 2020-04-15 – 2020-04-16 (×3): 100 mg via ORAL
  Filled 2020-04-15 (×3): qty 1

## 2020-04-15 MED ORDER — ACARBOSE 25 MG PO TABS
50.0000 mg | ORAL_TABLET | Freq: Three times a day (TID) | ORAL | Status: DC
  Administered 2020-04-15: 50 mg via ORAL
  Filled 2020-04-15 (×3): qty 2

## 2020-04-15 MED ORDER — METOCLOPRAMIDE HCL 5 MG/ML IJ SOLN: 5.0000 mg | Freq: Three times a day (TID) | INTRAMUSCULAR | Status: DC | PRN

## 2020-04-15 MED ORDER — LIDOCAINE 2% (20 MG/ML) 5 ML SYRINGE
INTRAMUSCULAR | Status: DC | PRN
  Administered 2020-04-15: 100 mg via INTRAVENOUS

## 2020-04-15 MED ORDER — MENTHOL 3 MG MT LOZG: 1.0000 | LOZENGE | OROMUCOSAL | Status: DC | PRN

## 2020-04-15 MED ORDER — PROPOFOL 10 MG/ML IV BOLUS
INTRAVENOUS | Status: DC | PRN
Start: 2020-04-15 — End: 2020-04-15
  Administered 2020-04-15: 150 mg via INTRAVENOUS

## 2020-04-15 MED ORDER — AMISULPRIDE (ANTIEMETIC) 5 MG/2ML IV SOLN: 10.0000 mg | Freq: Once | INTRAVENOUS | Status: DC | PRN

## 2020-04-15 MED ORDER — PHENYLEPHRINE HCL (PRESSORS) 10 MG/ML IV SOLN
INTRAVENOUS | Status: AC
  Filled 2020-04-15: qty 2

## 2020-04-15 MED ORDER — LACTATED RINGERS IV SOLN: INTRAVENOUS | Status: DC

## 2020-04-15 MED ORDER — LEVOTHYROXINE SODIUM 50 MCG PO TABS
175.0000 ug | ORAL_TABLET | Freq: Every day | ORAL | Status: DC
  Administered 2020-04-16: 175 ug via ORAL
  Filled 2020-04-15: qty 1

## 2020-04-15 MED ORDER — FENOFIBRATE 160 MG PO TABS
160.0000 mg | ORAL_TABLET | Freq: Every day | ORAL | Status: DC
  Administered 2020-04-15 – 2020-04-16 (×2): 160 mg via ORAL
  Filled 2020-04-15 (×2): qty 1

## 2020-04-15 MED ORDER — FENTANYL CITRATE (PF) 100 MCG/2ML IJ SOLN
INTRAMUSCULAR | Status: DC | PRN
  Administered 2020-04-15: 50 ug via INTRAVENOUS

## 2020-04-15 MED ORDER — FENTANYL CITRATE (PF) 100 MCG/2ML IJ SOLN
INTRAMUSCULAR | Status: AC
  Filled 2020-04-15: qty 2

## 2020-04-15 MED ORDER — LIDOCAINE HCL (PF) 2 % IJ SOLN
INTRAMUSCULAR | Status: AC
  Filled 2020-04-15: qty 5

## 2020-04-15 MED ORDER — METFORMIN HCL 500 MG PO TABS
1000.0000 mg | ORAL_TABLET | Freq: Two times a day (BID) | ORAL | Status: DC
  Administered 2020-04-15 – 2020-04-16 (×2): 1000 mg via ORAL
  Filled 2020-04-15 (×2): qty 2

## 2020-04-15 MED ORDER — CHLORHEXIDINE GLUCONATE 0.12 % MT SOLN
15.0000 mL | Freq: Once | OROMUCOSAL | Status: AC
  Administered 2020-04-15: 15 mL via OROMUCOSAL

## 2020-04-15 MED ORDER — EMPAGLIFLOZIN 10 MG PO TABS
10.0000 mg | ORAL_TABLET | Freq: Two times a day (BID) | ORAL | Status: DC
  Administered 2020-04-15 – 2020-04-16 (×2): 10 mg via ORAL
  Filled 2020-04-15 (×2): qty 1

## 2020-04-15 MED ORDER — PREGABALIN 100 MG PO CAPS
100.0000 mg | ORAL_CAPSULE | Freq: Three times a day (TID) | ORAL | Status: DC
  Administered 2020-04-15 – 2020-04-16 (×3): 100 mg via ORAL
  Filled 2020-04-15 (×3): qty 1

## 2020-04-15 MED ORDER — ZOLPIDEM TARTRATE 5 MG PO TABS
5.0000 mg | ORAL_TABLET | Freq: Every evening | ORAL | Status: DC | PRN
  Filled 2020-04-15: qty 1

## 2020-04-15 MED ORDER — ORAL CARE MOUTH RINSE: 15.0000 mL | Freq: Once | OROMUCOSAL | Status: AC

## 2020-04-15 MED ORDER — ROCURONIUM BROMIDE 10 MG/ML (PF) SYRINGE
PREFILLED_SYRINGE | INTRAVENOUS | Status: AC
  Filled 2020-04-15: qty 10

## 2020-04-15 MED ORDER — 0.9 % SODIUM CHLORIDE (POUR BTL) OPTIME
TOPICAL | Status: DC | PRN
  Administered 2020-04-15: 1000 mL

## 2020-04-15 MED ORDER — ACETAMINOPHEN 500 MG PO TABS
1000.0000 mg | ORAL_TABLET | Freq: Four times a day (QID) | ORAL | Status: DC
  Administered 2020-04-15 – 2020-04-16 (×3): 1000 mg via ORAL
  Filled 2020-04-15 (×3): qty 2

## 2020-04-15 SURGICAL SUPPLY — 65 items
AID PSTN UNV HD RSTRNT DISP (MISCELLANEOUS) ×1
BAG SPEC THK2 15X12 ZIP CLS (MISCELLANEOUS) ×1
BAG ZIPLOCK 12X15 (MISCELLANEOUS) ×2 IMPLANT
BIT DRILL 1.6MX128 (BIT) ×2 IMPLANT
BLADE SAW SAG 73X25 THK (BLADE) ×1
BLADE SAW SGTL 73X25 THK (BLADE) ×1 IMPLANT
CEMENT BONE DEPUY (Cement) ×2 IMPLANT
COOLER ICEMAN CLASSIC (MISCELLANEOUS) ×1 IMPLANT
COVER BACK TABLE 60X90IN (DRAPES) ×2 IMPLANT
COVER SURGICAL LIGHT HANDLE (MISCELLANEOUS) ×2 IMPLANT
COVER WAND RF STERILE (DRAPES) ×1 IMPLANT
DRAPE INCISE IOBAN 66X45 STRL (DRAPES) ×2 IMPLANT
DRAPE ORTHO SPLIT 77X108 STRL (DRAPES) ×4
DRAPE POUCH INSTRU U-SHP 10X18 (DRAPES) ×2 IMPLANT
DRAPE SURG 17X11 SM STRL (DRAPES) ×2 IMPLANT
DRAPE SURG ORHT 6 SPLT 77X108 (DRAPES) ×2 IMPLANT
DRAPE TOP 10253 STERILE (DRAPES) ×2 IMPLANT
DRAPE U-SHAPE 47X51 STRL (DRAPES) ×2 IMPLANT
DRSG AQUACEL AG ADV 3.5X 6 (GAUZE/BANDAGES/DRESSINGS) ×2 IMPLANT
DURAPREP 26ML APPLICATOR (WOUND CARE) ×4 IMPLANT
ELECT BLADE TIP CTD 4 INCH (ELECTRODE) ×2 IMPLANT
ELECT REM PT RETURN 15FT ADLT (MISCELLANEOUS) ×2 IMPLANT
GLOVE BIO SURGEON STRL SZ7 (GLOVE) ×2 IMPLANT
GLOVE BIO SURGEON STRL SZ7.5 (GLOVE) ×2 IMPLANT
GLOVE BIOGEL PI IND STRL 7.0 (GLOVE) ×1 IMPLANT
GLOVE BIOGEL PI IND STRL 8 (GLOVE) ×1 IMPLANT
GLOVE BIOGEL PI INDICATOR 7.0 (GLOVE) ×1
GLOVE BIOGEL PI INDICATOR 8 (GLOVE) ×1
GOWN STRL REUS W/TWL LRG LVL3 (GOWN DISPOSABLE) ×2 IMPLANT
GOWN STRL REUS W/TWL XL LVL3 (GOWN DISPOSABLE) ×2 IMPLANT
HANDPIECE INTERPULSE COAX TIP (DISPOSABLE) ×2
HEAD HUM 3.5XHI OFST 15X41 (Joint) IMPLANT
HEAD HUM AEQUALIS 41X15 (Joint) ×2 IMPLANT
HEMOSTAT SURGICEL 2X14 (HEMOSTASIS) ×2 IMPLANT
HOOD PEEL AWAY FLYTE STAYCOOL (MISCELLANEOUS) ×6 IMPLANT
KIT BASIN OR (CUSTOM PROCEDURE TRAY) ×2 IMPLANT
KIT TURNOVER KIT A (KITS) IMPLANT
MANIFOLD NEPTUNE II (INSTRUMENTS) ×2 IMPLANT
NDL TROCAR POINT SZ 2 1/2 (NEEDLE) ×1 IMPLANT
NEEDLE TROCAR POINT SZ 2 1/2 (NEEDLE) ×2 IMPLANT
NS IRRIG 1000ML POUR BTL (IV SOLUTION) ×2 IMPLANT
PACK SHOULDER (CUSTOM PROCEDURE TRAY) ×2 IMPLANT
PAD COLD SHLDR WRAP-ON (PAD) ×1 IMPLANT
PROTECTOR NERVE ULNAR (MISCELLANEOUS) IMPLANT
RESTRAINT HEAD UNIVERSAL NS (MISCELLANEOUS) ×2 IMPLANT
RETRIEVER SUT HEWSON (MISCELLANEOUS) ×2 IMPLANT
SET HNDPC FAN SPRY TIP SCT (DISPOSABLE) ×1 IMPLANT
SLING ARM IMMOBILIZER LRG (SOFTGOODS) IMPLANT
SLING ARM IMMOBILIZER MED (SOFTGOODS) ×1 IMPLANT
SMARTMIX MINI TOWER (MISCELLANEOUS) ×2
SPONGE LAP 18X18 RF (DISPOSABLE) ×1 IMPLANT
STEM HUM AEQUALIS STD SZ1B 66 (Stem) ×1 IMPLANT
STRIP CLOSURE SKIN 1/2X4 (GAUZE/BANDAGES/DRESSINGS) ×2 IMPLANT
SUCTION FRAZIER HANDLE 12FR (TUBING) ×2
SUCTION TUBE FRAZIER 12FR DISP (TUBING) ×1 IMPLANT
SUPPORT WRAP ARM LG (MISCELLANEOUS) ×2 IMPLANT
SUT ETHIBOND 2 V 37 (SUTURE) ×2 IMPLANT
SUT MNCRL AB 4-0 PS2 18 (SUTURE) ×2 IMPLANT
SUT VIC AB 2-0 CT1 27 (SUTURE) ×2
SUT VIC AB 2-0 CT1 TAPERPNT 27 (SUTURE) ×1 IMPLANT
TAPE LABRALWHITE 1.5X36 (TAPE) ×2 IMPLANT
TAPE SUT LABRALTAP WHT/BLK (SUTURE) ×2 IMPLANT
TOWEL OR 17X26 10 PK STRL BLUE (TOWEL DISPOSABLE) ×2 IMPLANT
TOWER SMARTMIX MINI (MISCELLANEOUS) ×1 IMPLANT
WATER STERILE IRR 1000ML POUR (IV SOLUTION) ×2 IMPLANT

## 2020-04-15 NOTE — Anesthesia Postprocedure Evaluation (Signed)
Anesthesia Post Note  Patient: [name]  Procedure(s) Performed: SHOULDER HEMI-ARTHROPLASTY (Right Shoulder)     Patient location during evaluation: PACU Anesthesia Type: General Level of consciousness: awake and alert Pain management: pain level controlled Vital Signs Assessment: post-procedure vital signs reviewed and stable Respiratory status: spontaneous breathing, nonlabored ventilation, respiratory function stable and patient connected to nasal cannula oxygen Cardiovascular status: blood pressure returned to baseline and stable Postop Assessment: no apparent nausea or vomiting Anesthetic complications: no   No complications documented.  Last Vitals:  Vitals:   04/15/20 1130 04/15/20 1145  BP: (!) 145/71 (!) 145/78  Pulse: 69 70  Resp: 19 (!) 21  Temp:    SpO2: 97% 97%    Last Pain:  Vitals:   04/15/20 1100  TempSrc:   PainSc: 0-No pain                 Doloris Servantes S

## 2020-04-15 NOTE — Discharge Instructions (Signed)
Discharge Instructions after Shoulder Arthroplasty   . A sling has been provided for you. Remove the sling 5 times each day to perform motion exercises.  . Use ice on the shoulder intermittently over the first 48 hours after surgery.  . Pain medication has been prescribed for you.  . Use your medication liberally over the first 48 hours, and then begin to taper your use. You may take Extra Strength Tylenol or Tylenol only in place of the pain pills. DO NOT take ANY nonsteroidal anti-inflammatory pain medications: Advil, Motrin, Ibuprofen, Aleve, Naproxen, or Naprosyn. . Take one aspirin a day for 2 weeks after surgery, unless you have an aspirin sensitivity/allergy or asthma. . Leave your dressing on until your first follow up visit.  You may shower with the dressing.  Hold your arm as if you still have your sling on while you shower. . Active reaching and lifting are not permitted. You may use the operative arm for activities of daily living that do not require the operative arm to leave the side of the body, such as eating, drinking, bathing, etc.  . Three to 5 times each day you should perform assisted overhead reaching and external rotation (outward turning) exercises with the operative arm. You were taught these exercises prior to discharge. Both exercises should be done with the non-operative arm used as the "therapist arm" while the operative arm remains relaxed. Ten of each exercise should be done three to five times each day.   Overhead reach is helping to lift your stiff arm up as high as it will go. To stretch your overhead reach, lie flat on your back, relax, and grasp the wrist of the tight shoulder with your opposite hand. Using the power in your opposite arm, bring the stiff arm up as far as it is comfortable. Start holding it for ten seconds and then work up to where you can hold it for a count of 30. Breathe slowly and deeply while the arm is moved. Repeat this stretch ten times, trying  to help the ar up a little higher each time.     External rotation is turning the arm out to the side while your elbow stays close to your body. External rotation is best stretched while you are lying on your back. Hold a cane, yardstick, broom handle, or dowel in both hands. Bend both elbows to a right angle. Use steady, gentle force from your normal arm to rotate the hand of the stiff shoulder out away from your body. Continue the rotation until it is straight in front of you holding it there for a count of 10. Do not go beyond this level of rotation until seen back by Dr. Tamera Punt. Repeat this exercise ten times slowly.      Please call [phone] during normal business hours or [phone] after hours for any problems. Including the following:  - excessive redness of the incisions - drainage for more than 4 days - fever of more than 101.5 F  *Please note that pain medications will not be refilled after hours or on weekends.  Dental Antibiotics:  In most cases prophylactic antibiotics for Dental procdeures after total joint surgery are not necessary.  Exceptions are as follows:  1. History of prior total joint infection  2. Severely immunocompromised (Organ Transplant, cancer chemotherapy, Rheumatoid biologic meds such as [place])  3. Poorly controlled diabetes (A1C &gt; 8.0, blood glucose over 200)  If you have one of these conditions, contact your surgeon for  an antibiotic prescription, prior to your dental procedure.    

## 2020-04-15 NOTE — Progress Notes (Signed)
Assisted Dr. Rose with right, ultrasound guided, interscalene  block. Side rails up, monitors on throughout procedure. See vital signs in flow sheet. Tolerated Procedure well. 

## 2020-04-15 NOTE — H&P (Signed)
[name] is an 59 y.o. female.   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  Pain interferes with sleep and quality of life.  Past Medical History:  Diagnosis Date  . Allergy   . Anxiety   . Arthritis   . Back pain, chronic   . Bipolar disorder ([place])    denies  . Bronchitis    hx-no inhalers at home  . Carotid bruit   . Carpal tunnel syndrome on both sides   . Cataract   . Coronary artery disease   . DDD (degenerative disc disease), lumbar   . Depression   . Diabetes mellitus    type II  . Ejection fraction    .  Marland Kitchen GERD (gastroesophageal reflux disease)   . Hx of CABG   . Hypercholesteremia   . Hypertension   . Hypothyroidism   . Multiple thyroid nodules   . Myocardial infarction ([place]) 11/20/06   "4 in one section"  . Neuromuscular disorder ([place])   . Nodule of neck   . PTSD (post-traumatic stress disorder)   . Sleep apnea    cpap     Past Surgical History:  Procedure Laterality Date  . ABDOMINAL HYSTERECTOMY  1984  . BACK SURGERY     fusion T4- T-5  . BREAST EXCISIONAL BIOPSY Right    benign  . BREAST EXCISIONAL BIOPSY Left    benign  . COLONOSCOPY    . CORONARY ARTERY BYPASS GRAFT  2008  . KNEE SURGERY Left 2009   Menisus tear  . ORIF ANKLE FRACTURE Right 07/12/2012   Procedure: OPEN REDUCTION INTERNAL FIXATION (ORIF) RIGHT ANKLE FRACTURE;  Surgeon: Alta Corning, MD;  Location: [facility];  Service: Orthopedics;  Laterality: Right;  . SACROILIAC JOINT FUSION Left 11/19/2019   Procedure: LEFT SACROILIAC JOINT FUSION;  Surgeon: Phylliss Bob, MD;  Location: [facility];  Service: Orthopedics;  Laterality: Left;  posterior  . SHOULDER SURGERY Right 11/08/2016   rotator cuff  . TUBAL LIGATION    . WRIST SURGERY Left     Family History  Problem Relation Age of Onset  . Heart disease Mother   . Hyperlipidemia Mother   . Hypertension Mother    . Heart disease Father   . Stroke Father   . Hypertension Father   . Hyperlipidemia Father   . Hypertension Sister   . Hypertension Brother   . Cancer Daughter        5 different kinds of cancer  . Hypertension Maternal Grandmother   . Hypertension Maternal Grandfather   . Cancer Maternal Grandfather   . Hypertension Paternal Grandmother   . Hypertension Paternal Grandfather   . Cancer Maternal Aunt        breast and ovarian  . Cancer Paternal Aunt        breast  . Colon cancer Neg Hx   . Esophageal cancer Neg Hx   . Rectal cancer Neg Hx   . Stomach cancer Neg Hx   . Liver cancer Neg Hx    Social History:  reports that she has been smoking cigarettes. She has a 19.00 pack-year smoking history. She has never used smokeless tobacco. She reports that she does not drink alcohol and does not use drugs.  Allergies:  Allergies  Allergen Reactions  . Latuda [Lurasidone Hcl] Shortness Of Breath  . Coconut Flavor Swelling and Rash  . Codeine Nausea And Vomiting    "  I get deathly sick , vomitting"  . Ibuprofen Swelling and Rash  . Prednisone Swelling and Rash  . Cyclobenzaprine     "almost made heart stop, couldn't breathe"  . Meloxicam Itching  . Morphine Nausea And Vomiting    Medications Prior to Admission  Medication Sig Dispense Refill  . acarbose (PRECOSE) 50 MG tablet Take 1 tablet (50 mg total) by mouth 3 (three) times daily with meals. 270 tablet 3  . carvedilol (COREG) 3.125 MG tablet TAKE 1 TABLET TWICE DAILY (Patient taking differently: Take 3.125 mg by mouth 2 (two) times daily with a meal. TAKE 1 TABLET TWICE DAILY) 60 tablet 0  . docusate sodium (COLACE) 100 MG capsule Take 100 mg by mouth 2 (two) times daily.    . Dulaglutide (TRULICITY) 7.40 CX/4.4YJ SOPN Inject 0.75 mg into the skin every Saturday.    . DULoxetine (CYMBALTA) 60 MG capsule Take 60 mg by mouth 2 (two) times daily.    . Empagliflozin-metFORMIN HCl (SYNJARDY) 12.08-998 MG TABS Take 1 tablet by  mouth in the morning and at bedtime.    Marland Kitchen ezetimibe (ZETIA) 10 MG tablet Take 1 tablet (10 mg total) by mouth daily. Please make overdue appt with Dr. Acie Fredrickson before anymore refills. 1st attempt (Patient taking differently: Take 10 mg by mouth daily.) 90 tablet 0  . famotidine (PEPCID) 20 MG tablet Take 1 tablet (20 mg total) by mouth 2 (two) times daily. 180 tablet 0  . fenofibrate (TRICOR) 145 MG tablet Take 1 tablet (145 mg total) by mouth daily. Please make overdue appt with Dr. Acie Fredrickson before anymore refills. 1st attempt (Patient taking differently: Take 145 mg by mouth daily.) 30 tablet 0  . fluticasone (FLONASE) 50 MCG/ACT nasal spray Place 1 spray into both nostrils at bedtime as needed for allergies or rhinitis.    . Ipratropium-Albuterol (COMBIVENT RESPIMAT) 20-100 MCG/ACT AERS respimat Inhale 1 puff into the lungs every 6 (six) hours. (Patient taking differently: Inhale 1 puff into the lungs every 6 (six) hours as needed for wheezing or shortness of breath.) 4 g 11  . levothyroxine (SYNTHROID) 175 MCG tablet Take 175 mcg by mouth daily before breakfast.    . loratadine (CLARITIN) 10 MG tablet Take 1 tablet (10 mg total) by mouth daily. (Patient taking differently: Take 10 mg by mouth daily as needed for allergies.) 90 tablet 3  . omeprazole (PRILOSEC) 40 MG capsule TAKE 1 CAPSULE DAILY 30 MINUTES BEFORE A MEAL (Patient taking differently: Take 40 mg by mouth daily as needed (for acid reflux take 30 minutes before a meal).) 90 capsule 3  . Oxycodone HCl 10 MG TABS Take 1 tablet by mouth every 4 (four) hours as needed (pain).     . pregabalin (LYRICA) 150 MG capsule Take 100 mg by mouth 3 (three) times daily.     . rosuvastatin (CRESTOR) 40 MG tablet Take 40 mg by mouth daily.    Marland Kitchen tiZANidine (ZANAFLEX) 4 MG tablet Take 4 mg by mouth every 6 (six) hours as needed for muscle spasms.    . Vitamin D, Ergocalciferol, (DRISDOL) 1.25 MG (50000 UNIT) CAPS capsule Take 50,000 Units by mouth every 7  (seven) days. Saturdays    . Blood Glucose Calibration (ACCU-CHEK GUIDE CONTROL) LIQD 1 each by Does not apply route as needed. Use to calibrate glucometer prn; E11.9 1 each 1  . glucose blood (ACCU-CHEK GUIDE) test strip Use to check blood sugar 3 times daily. 300 each 1  . Lancets Misc. (ACCU-CHEK  FASTCLIX LANCET) KIT 1 each by Does not apply route 3 (three) times daily. Use to monitor glucose levels TID; E11.9 1 kit 0  . promethazine-dextromethorphan (PROMETHAZINE-DM) 6.25-15 MG/5ML syrup Take 5 mLs by mouth 3 (three) times daily as needed for cough.       Results for orders placed or performed during the hospital encounter of 04/15/20 (from the past 48 hour(s))  Urinalysis, Routine w reflex microscopic Urine, Clean Catch     Status: Abnormal   Collection Time: 04/15/20  7:34 AM  Result Value Ref Range   Color, Urine STRAW (A) YELLOW   APPearance CLEAR CLEAR   Specific Gravity, Urine 1.006 1.005 - 1.030   pH 7.0 5.0 - 8.0   Glucose, UA >=500 (A) NEGATIVE mg/dL   Hgb urine dipstick NEGATIVE NEGATIVE   Bilirubin Urine NEGATIVE NEGATIVE   Ketones, ur NEGATIVE NEGATIVE mg/dL   Protein, ur NEGATIVE NEGATIVE mg/dL   Nitrite NEGATIVE NEGATIVE   Leukocytes,Ua SMALL (A) NEGATIVE   RBC / HPF 0-5 0 - 5 RBC/hpf   WBC, UA 0-5 0 - 5 WBC/hpf   Bacteria, UA NONE SEEN NONE SEEN    Comment: Performed at [facility], [place] [address] [place], [place] [address]  Glucose, capillary     Status: Abnormal   Collection Time: 04/15/20  7:43 AM  Result Value Ref Range   Glucose-Capillary 115 (H) 70 - 99 mg/dL    Comment: Glucose reference range applies only to samples taken after fasting for at least 8 hours.   Comment 1 Notify RN    Comment 2 Document in Chart   Type and screen Order type and screen if day of surgery is less than 15 days from draw of preadmission visit or order morning of surgery if day of surgery is greater than 6 days from preadmission visit.     Status: None  (Preliminary result)   Collection Time: 04/15/20  8:06 AM  Result Value Ref Range   ABO/RH(D) PENDING    Antibody Screen PENDING    Sample Expiration      04/18/2020,2359 Performed at [facility], [place] [address], [place], [place] [address]    No results found.  Review of Systems  All other systems reviewed and are negative.   Blood pressure 128/72, pulse (!) 59, temperature 98.6 F (37 C), temperature source Oral, resp. rate 14, SpO2 97 %. Physical Exam HENT:     Head: Atraumatic.  Eyes:     Extraocular Movements: Extraocular movements intact.  Cardiovascular:     Pulses: Normal pulses.  Pulmonary:     Effort: Pulmonary effort is normal.  Musculoskeletal:     Comments: R shoulder pain with limited ROM. NVID  Skin:    General: Skin is warm and dry.  Neurological:     Mental Status: She is alert.  Psychiatric:        Mood and Affect: Mood normal.      Assessment/Plan R shoulder AVN Plan R shoulder arthroplasty Risks / benefits of surgery discussed Consent on chart  NPO for OR Preop antibiotics   Isabella Stalling, MD 04/15/2020, 8:59 AM

## 2020-04-15 NOTE — Progress Notes (Signed)
Pt declining use of CPAP QHS at this time, RN aware.

## 2020-04-15 NOTE — Anesthesia Procedure Notes (Addendum)
Anesthesia Regional Block: Interscalene brachial plexus block   Pre-Anesthetic Checklist: ,, timeout performed, Correct Patient, Correct Site, Correct Laterality, Correct Procedure, Correct Position, site marked, Risks and benefits discussed,  Surgical consent,  Pre-op evaluation,  At surgeon's request and post-op pain management  Laterality: Right  Prep: chloraprep       Needles:  Injection technique: Single-shot  Needle Type: Echogenic Needle     Needle Length: 9cm      Additional Needles:   Procedures:,,,, ultrasound used (permanent image in chart),,,,  Narrative:  Start time: 04/15/2020 8:57 AM End time: 04/15/2020 9:04 AM Injection made incrementally with aspirations every 5 mL.  Performed by: Personally  Anesthesiologist: Myrtie Soman, MD  Additional Notes: Patient tolerated the procedure well without complications

## 2020-04-15 NOTE — Transfer of Care (Signed)
Immediate Anesthesia Transfer of Care Note  Patient: Danese Dorsainvil Lopez-Valencia  Procedure(s) Performed: SHOULDER HEMI-ARTHROPLASTY (Right Shoulder)  Patient Location: PACU  Anesthesia Type:General  Level of Consciousness: awake, alert , oriented and patient cooperative  Airway & Oxygen Therapy: Patient Spontanous Breathing and Patient connected to face mask oxygen  Post-op Assessment: Report given to RN, Post -op Vital signs reviewed and stable and Patient moving all extremities  Post vital signs: Reviewed and stable  Last Vitals:  Vitals Value Taken Time  BP    Temp    Pulse    Resp 11 04/15/20 1103  SpO2    Vitals shown include unvalidated device data.  Last Pain:  Vitals:   04/15/20 0758  TempSrc: Oral  PainSc:       Patients Stated Pain Goal: 4 (93/73/74 9664)  Complications: No complications documented.

## 2020-04-15 NOTE — Anesthesia Preprocedure Evaluation (Signed)
Anesthesia Evaluation  Patient identified by MRN, date of birth, ID band Patient awake    Reviewed: Allergy & Precautions, H&P , NPO status , Patient's Chart, lab work & pertinent test results  Airway Mallampati: II  TM Distance: >3 FB Neck ROM: Full    Dental no notable dental hx.    Pulmonary Current Smoker,    Pulmonary exam normal breath sounds clear to auscultation       Cardiovascular hypertension, + CAD, + Past MI and + CABG   Rhythm:Regular Rate:Normal + Carotid Bruit    Neuro/Psych negative neurological ROS  negative psych ROS   GI/Hepatic negative GI ROS, Neg liver ROS,   Endo/Other  diabetesHypothyroidism   Renal/GU negative Renal ROS  negative genitourinary   Musculoskeletal negative musculoskeletal ROS (+)   Abdominal   Peds negative pediatric ROS (+)  Hematology negative hematology ROS (+)   Anesthesia Other Findings   Reproductive/Obstetrics negative OB ROS                             Anesthesia Physical Anesthesia Plan  ASA: III  Anesthesia Plan: General   Post-op Pain Management:    Induction: Intravenous  PONV Risk Score and Plan: 2 and Ondansetron, Dexamethasone and Treatment may vary due to age or medical condition  Airway Management Planned: Oral ETT  Additional Equipment:   Intra-op Plan:   Post-operative Plan: Extubation in OR  Informed Consent: I have reviewed the patients History and Physical, chart, labs and discussed the procedure including the risks, benefits and alternatives for the proposed anesthesia with the patient or authorized representative who has indicated his/her understanding and acceptance.     Dental advisory given  Plan Discussed with: CRNA and Surgeon  Anesthesia Plan Comments:         Anesthesia Quick Evaluation

## 2020-04-15 NOTE — Anesthesia Procedure Notes (Signed)
Procedure Name: Intubation Date/Time: 04/15/2020 9:45 AM Performed by: Victoriano Lain, CRNA Pre-anesthesia Checklist: Patient identified, Emergency Drugs available, Suction available, Patient being monitored and Timeout performed Patient Re-evaluated:Patient Re-evaluated prior to induction Oxygen Delivery Method: Circle system utilized Preoxygenation: Pre-oxygenation with 100% oxygen Induction Type: IV induction Ventilation: Mask ventilation without difficulty Laryngoscope Size: Mac and 4 Grade View: Grade I Tube type: Oral Tube size: 7.0 mm Number of attempts: 1 Airway Equipment and Method: Stylet Placement Confirmation: ETT inserted through vocal cords under direct vision,  positive ETCO2 and breath sounds checked- equal and bilateral Secured at: 23 cm Tube secured with: Tape Dental Injury: Teeth and Oropharynx as per pre-operative assessment

## 2020-04-15 NOTE — Anesthesia Procedure Notes (Signed)
Anesthesia Procedure Image    

## 2020-04-15 NOTE — Op Note (Addendum)
Procedure(s): SHOULDER HEMI-ARTHROPLASTY Procedure Note  [name] female 59 y.o. 04/15/2020  Preoperative diagnosis: Right shoulder avascular necrosis  Postoperative diagnosis: Same  Procedure(s) and Anesthesia Type:    RIGHT SHOULDER HEMI-ARTHROPLASTY - General  Surgeon(s) and Role:    Tania Ade, MD - Primary   Indications:  59 y.o. female with advanced R shoulder AVN. Pain and dysfunction interfered with quality of life and nonoperative treatment with activity modification, NSAIDS and injections failed.     Surgeon: Isabella Stalling   Assistants: Jeanmarie Hubert PA-C [facility] was present and scrubbed throughout the procedure and was essential in positioning, retraction, exposure, and closure)  Anesthesia: General endotracheal anesthesia with preoperative interscalene block given by the attending anesthesiologist    Procedure Detail  SHOULDER HEMI-ARTHROPLASTY  Findings: Tornier flex anatomic press-fit size 1B stem with a 41 head.  The glenoid cartilage was completely intact.   Estimated Blood Loss:  200 mL         Drains: None   Blood Given: none          Specimens: none        Complications:  * No complications entered in OR log *         Disposition: PACU - hemodynamically stable.         Condition: stable    Procedure:   The patient was identified in the preoperative holding area where I personally marked the operative extremity after verifying with the patient and consent. She  was taken to the operating room where She was transferred to the   operative table.  The patient received an interscalene block in   the holding area by the attending anesthesiologist.  General anesthesia was induced   in the operating room without complication.  The patient did receive IV  Ancef prior to the commencement of the procedure.  The patient was   placed in the beach-chair position with the back raised about 30   degrees.  The nonoperative  extremity and head and neck were carefully   positioned and padded protecting against neurovascular compromise.  The   left upper extremity was then prepped and draped in the standard sterile   fashion.    The appropriate operative time-out was performed with   Anesthesia, the perioperative staff, as well as myself and we all agreed   that the right side was the correct operative site.  An approximately   10 cm incision was made from the tip of the coracoid to the center point of the   humerus at the level of the axilla.  Dissection was carried down sharply   through subcutaneous tissues and cephalic vein was identified and taken   laterally with the deltoid.  The pectoralis major was taken medially.  The   upper 1 cm of the pectoralis major was released from its attachment on   the humerus.  The clavipectoral fascia was incised just lateral to the   conjoined tendon.  This incision was carried up to but not into the   coracoacromial ligament.  Digital palpation was used to prove   integrity of the axillary nerve which was protected throughout the   procedure.  Musculocutaneous nerve was not palpated in the operative   field.  Conjoined tendon was then retracted gently medially and the   deltoid laterally.  Anterior circumflex humeral vessels were clamped and   coagulated.  The soft tissues overlying the biceps was incised and this   incision was carried  across the transverse humeral ligament to the base   of the coracoid.  The biceps was noted to be severely degenerated. It was released from the superior labrum. The biceps was then tenodesed to the soft tissue just above   pectoralis major and the remaining portion of the biceps superiorly was   excised.  An osteotomy was performed at the lesser tuberosity.  The capsule was then   released all the way down to the 6 o'clock position of the humeral head.   The humeral head was then delivered with simultaneous adduction,   extension and  external rotation.  All humeral osteophytes were removed   and the anatomic neck of the humerus was marked and cut free hand at   approximately 25 degrees retroversion within about 3 mm of the cuff   reflection posteriorly.  The head size was estimated to be a 41 medium   offset.  At that point, the humeral head was retracted posteriorly with   a Fukuda retractor.  The glenoid cartilage was carefully examined and probed.  She was found to have completely intact glenoid cartilage without any evidence for chondromalacia or chondral flaps.  I felt that given her disease limited only to the humeral head she would do best with hemiarthroplasty rather than total shoulder arthroplasty.  The proximal humerus was then again exposed.   The entry awl was used followed by sounding reamers and then sequentially broached for a size 1 stem. This was then left in place. Trial head was placed with a 41.  With the trial implantation of the component,  there was approximately 50% posterior translation with immediate snap back to the   anatomic position.  With forward elevation, there was no tendency   towards posterior subluxation.   The trial was removed and the final implant was prepared on a back table.  The trial was removed and the final implant was prepared on a back table.   3 small holes were drilled on the medial side of the lesser tuberosity osteotomy, through which 2 labral tapes were passed. The implant was then placed through the loop of the 2 labral tapes and impacted with an excellent press-fit. This achieved excellent anatomic reconstruction of the proximal humerus.  The joint was then copiously irrigated with pulse lavage.  The subscapularis and   lesser tuberosity osteotomy were then repaired using the 2 labral tapes previously passed in a double row fashion with horizontal mattress sutures medially brought over through bone tunnels tied over a bone bridge laterally.   One #1 Ethibond was placed at the  rotator interval just above   the lesser tuberosity. Copious irrigation was used. Skin was closed with 2-0 Vicryl sutures in the deep dermal layer and 4-0 Monocryl in a subcuticular  running fashion.  Sterile dressings were then applied including Aquacel.  The patient was placed in a sling and allowed to awaken from general anesthesia and taken to the recovery room in stable condition.      POSTOPERATIVE PLAN:  Early passive range of motion will be allowed with the goal of 0 degrees external rotation and 90 degrees forward elevation.  No internal rotation at this time.  No active motion of the arm until the lesser tuberosity heals.  The patient will likely be kept in the hospital overnight for observation.

## 2020-04-16 ENCOUNTER — Encounter (HOSPITAL_COMMUNITY): Payer: Self-pay | Admitting: Orthopedic Surgery

## 2020-04-16 DIAGNOSIS — M87811 Other osteonecrosis, right shoulder: Secondary | ICD-10-CM | POA: Diagnosis not present

## 2020-04-16 LAB — GLUCOSE, CAPILLARY: Glucose-Capillary: 87 mg/dL (ref 70–99)

## 2020-04-16 NOTE — Evaluation (Signed)
Occupational Therapy Evaluation Patient Details Name: [name] MRN: [id number] DOB: 01/03/1961 Today's Date: 04/16/2020    History of Present Illness s/p R shoulder hemiarthroplasty   Clinical Impression   [name] is a 59 year old woman s/p shoulder replacement without functional use of right dominant upper extremity secondary to effects of surgery, interscalene block and shoulder precautions. Therapist provided education and instruction to patient and spouse in regards to exercises, precautions, positioning, donning upper extremity clothing and bathing while maintaining shoulder precautions, ice and edema management and donning/doffing sling. Patient familiar with education from prior therapy. Patient and spouse verbalized understanding and handouts provided to maximize retention of education. Patient eager to go home. Patient to follow up with MD for further therapy needs.      Follow Up Recommendations  No OT follow up    Equipment Recommendations  None recommended by OT    Recommendations for Other Services       Precautions / Restrictions Precautions Precautions: Shoulder Type of Shoulder Precautions: No AROM, No internal Rotation, PROM 0-90 degrees, No pendulums, No Abduction, External rotation to neutral Shoulder Interventions: Shoulder sling/immobilizer;Off for dressing/bathing/exercises;At all times Precaution Comments: AROM of elbow, wrist and hand. Required Braces or Orthoses: Sling Restrictions Weight Bearing Restrictions: Yes RUE Weight Bearing: Non weight bearing      Mobility Bed Mobility Overal bed mobility: Modified Independent                  Transfers Overall transfer level: Modified independent                    Balance Overall balance assessment: No apparent balance deficits (not formally assessed)                                         ADL either performed or assessed with clinical  judgement   ADL Overall ADL's : Needs assistance/impaired Eating/Feeding: Modified independent   Grooming: Modified independent   Upper Body Bathing: Minimal assistance   Lower Body Bathing: Modified independent   Upper Body Dressing : Minimal assistance Upper Body Dressing Details (indicate cue type and reason): Patient dressed reports assistance with donning right sleeve Lower Body Dressing: Modified independent   Toilet Transfer: Modified Independent   Toileting- Clothing Manipulation and Hygiene: Modified independent               Vision Patient Visual Report: No change from baseline       Perception     Praxis      Pertinent Vitals/Pain Pain Assessment: Faces Faces Pain Scale: Hurts a little bit Pain Location: R shoulder Pain Descriptors / Indicators: Aching;Grimacing Pain Intervention(s): Monitored during session     Hand Dominance     Extremity/Trunk Assessment Upper Extremity Assessment Upper Extremity Assessment: RUE deficits/detail RUE Deficits / Details: Grossly functional AROM of fingers and wrist, still limited forearm, elbow movement secondary to block.   Lower Extremity Assessment Lower Extremity Assessment: Overall WFL for tasks assessed   Cervical / Trunk Assessment Cervical / Trunk Assessment: Normal   Communication     Cognition Arousal/Alertness: Awake/alert Behavior During Therapy: WFL for tasks assessed/performed Overall Cognitive Status: Within Functional Limits for tasks assessed  General Comments       Exercises     Shoulder Instructions      Home Living Family/patient expects to be discharged to:: Private residence Living Arrangements: Children Available Help at Discharge: Family;Available 24 hours/day                                    Prior Functioning/Environment                   OT Problem List: Decreased strength;Decreased range of  motion;Increased edema;Pain;Impaired UE functional use      OT Treatment/Interventions:      OT Goals(Current goals can be found in the care plan section) Acute Rehab OT Goals Patient Stated Goal: To go home soon OT Goal Formulation: All assessment and education complete, DC therapy  OT Frequency:     Barriers to D/C:            Co-evaluation              AM-PAC OT "6 Clicks" Daily Activity     Outcome Measure Help from another person eating meals?: None Help from another person taking care of personal grooming?: None Help from another person toileting, which includes using toliet, bedpan, or urinal?: None Help from another person bathing (including washing, rinsing, drying)?: A Little Help from another person to put on and taking off regular upper body clothing?: A Little Help from another person to put on and taking off regular lower body clothing?: None 6 Click Score: 22   End of Session Nurse Communication:  (OT education complete)  Activity Tolerance: Patient tolerated treatment well Patient left: in chair;with call bell/phone within reach;with family/visitor present  OT Visit Diagnosis: Muscle weakness (generalized) (M62.81);Pain Pain - Right/Left: Right Pain - part of body: Shoulder                Time: 3536-1443 OT Time Calculation (min): 18 min Charges:  OT General Charges $OT Visit: 1 Visit OT Evaluation $OT Eval Low Complexity: 1 Low  Saivon Prowse, OTR/L [place] [phone] Pager: [place] 04/16/2020, 9:12 AM

## 2020-04-16 NOTE — Progress Notes (Signed)
   PATIENT ID: [name]   1 Day Post-Op Procedure(s) (LRB): SHOULDER HEMI-ARTHROPLASTY (Right)  Subjective: doing well, pain minimal, block still working. Up with OT  Objective:  Vitals:   04/16/20 0139 04/16/20 0522  BP: 113/63 (!) 128/58  Pulse: (!) 58 (!) 59  Resp: 16 16  Temp: 98 F (36.7 C) 98.2 F (36.8 C)  SpO2: 93% 90%     Right dressing c/d/i Wiggles fingers  Labs:  No results for input(s): HGB in the last 72 hours.No results for input(s): WBC, RBC, HCT, PLT in the last 72 hours.No results for input(s): NA, K, CL, CO2, BUN, CREATININE, GLUCOSE, CALCIUM in the last 72 hours.  Assessment and Plan: 1 day s/p right shoulder hemiarthroplasty D/c home today Has oxycodone at home, no scripts given today and patient aware  VTE proph: asa, scds

## 2020-04-16 NOTE — Plan of Care (Signed)
Plan of care reviewed and discussed with the patient. 

## 2020-04-16 NOTE — Progress Notes (Signed)
Patient discharged to home w/ family. Given all belongings, instructions, equipment. Verbalized understanding of all instructions.

## 2020-05-03 NOTE — Discharge Summary (Signed)
Patient ID: [name] MRN: [id number] DOB/AGE: [birth year] 59 y.o.  Admit date: 04/15/2020 Discharge date: 04/16/2020  Admission Diagnoses:  Active Problems:   S/P shoulder replacement, right   Discharge Diagnoses:  Same  Past Medical History:  Diagnosis Date  . Allergy   . Anxiety   . Arthritis   . Back pain, chronic   . Bipolar disorder ([place])    denies  . Bronchitis    hx-no inhalers at home  . Carotid bruit   . Carpal tunnel syndrome on both sides   . Cataract   . Coronary artery disease   . DDD (degenerative disc disease), lumbar   . Depression   . Diabetes mellitus    type II  . Ejection fraction    .  [place] Kitchen GERD (gastroesophageal reflux disease)   . Hx of CABG   . Hypercholesteremia   . Hypertension   . Hypothyroidism   . Multiple thyroid nodules   . Myocardial infarction ([place]) 11/20/06   "4 in one section"  . Neuromuscular disorder ([place])   . Nodule of neck   . PTSD (post-traumatic stress disorder)   . Sleep apnea    cpap     Surgeries: Procedure(s): SHOULDER HEMI-ARTHROPLASTY on 04/15/2020   Consultants:   Discharged Condition: Improved  Hospital Course: [name] is an 60 y.o. 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 Patient has severe unremitting pain that affects sleep, daily activities, and work/hobbies. After pre-op clearance the patient was taken to the operating room on 04/15/2020 and underwent  Procedure(s): SHOULDER HEMI-ARTHROPLASTY.    Patient was given perioperative antibiotics:  Anti-infectives (From admission, onward)   Start     Dose/Rate Route Frequency Ordered Stop   04/15/20 1600  ceFAZolin (ANCEF) IVPB 1 g/50 mL premix        1 g 100 mL/hr over 30 Minutes Intravenous Every 6 hours 04/15/20 1346 04/16/20 0524   04/15/20 0745  ceFAZolin (ANCEF) IVPB 2g/100 mL premix        2 g 200 mL/hr over 30 Minutes Intravenous On call to O.R. 04/15/20 0733 04/15/20  1016       Patient was given sequential compression devices, early ambulation, and chemoprophylaxis to prevent DVT.  Patient benefited maximally from hospital stay and there were no complications.    Recent vital signs: No data found.   Recent laboratory studies: No results for input(s): WBC, HGB, HCT, PLT, NA, K, CL, CO2, BUN, CREATININE, GLUCOSE, INR, CALCIUM in the last 72 hours.  Invalid input(s): PT, 2   Discharge Medications:   Allergies as of 04/16/2020      Reactions   Latuda [lurasidone Hcl] Shortness Of Breath   Coconut Flavor Swelling, Rash   Codeine Nausea And Vomiting   "I get deathly sick , vomitting"   Ibuprofen Swelling, Rash   Prednisone Swelling, Rash   Cyclobenzaprine    "almost made heart stop, couldn't breathe"   Meloxicam Itching   Morphine Nausea And Vomiting      Medication List    TAKE these medications   Accu-Chek FastClix Lancet Kit 1 each by Does not apply route 3 (three) times daily. Use to monitor glucose levels TID; E11.9   Accu-Chek Guide Control Liqd 1 each by Does not apply route as needed. Use to calibrate glucometer prn; E11.9   carvedilol 3.125 MG tablet Commonly known as: COREG TAKE 1 TABLET TWICE DAILY What changed:   when to take this  additional instructions   docusate sodium 100 MG capsule Commonly known as: COLACE Take 100 mg by mouth 2 (two) times daily.   DULoxetine 60 MG capsule Commonly known as: CYMBALTA Take 60 mg by mouth 2 (two) times daily.   ezetimibe 10 MG tablet Commonly known as: ZETIA Take 1 tablet (10 mg total) by mouth daily. Please make overdue appt with Dr. Acie Fredrickson before anymore refills. 1st attempt What changed: additional instructions   famotidine 20 MG tablet Commonly known as: PEPCID Take 1 tablet (20 mg total) by mouth 2 (two) times daily.   fenofibrate 145 MG tablet Commonly known as: TRICOR Take 1 tablet (145 mg total) by mouth daily. Please make overdue appt with Dr. Acie Fredrickson before  anymore refills. 1st attempt What changed: additional instructions   fluticasone 50 MCG/ACT nasal spray Commonly known as: FLONASE Place 1 spray into both nostrils at bedtime as needed for allergies or rhinitis.   glucose blood test strip Commonly known as: Accu-Chek Guide Use to check blood sugar 3 times daily.   Ipratropium-Albuterol 20-100 MCG/ACT Aers respimat Commonly known as: Combivent Respimat Inhale 1 puff into the lungs every 6 (six) hours. What changed:   when to take this  reasons to take this   levothyroxine 175 MCG tablet Commonly known as: SYNTHROID Take 175 mcg by mouth daily before breakfast.   loratadine 10 MG tablet Commonly known as: CLARITIN Take 1 tablet (10 mg total) by mouth daily. What changed:   when to take this  reasons to take this   omeprazole 40 MG capsule Commonly known as: PRILOSEC TAKE 1 CAPSULE DAILY 30 MINUTES BEFORE A MEAL What changed:   how much to take  how to take this  when to take this  reasons to take this  additional instructions   Oxycodone HCl 10 MG Tabs Take 1 tablet by mouth every 4 (four) hours as needed (pain).   pregabalin 150 MG capsule Commonly known as: LYRICA Take 100 mg by mouth 3 (three) times daily.   promethazine-dextromethorphan 6.25-15 MG/5ML syrup Commonly known as: PROMETHAZINE-DM Take 5 mLs by mouth 3 (three) times daily as needed for cough.   rosuvastatin 40 MG tablet Commonly known as: CRESTOR Take 40 mg by mouth daily.   Synjardy 12.08-998 MG Tabs Generic drug: Empagliflozin-metFORMIN HCl Take 1 tablet by mouth in the morning and at bedtime.   tiZANidine 4 MG tablet Commonly known as: ZANAFLEX Take 4 mg by mouth every 6 (six) hours as needed for muscle spasms.   Trulicity 7.67 HA/1.9FX Sopn Generic drug: Dulaglutide Inject 0.75 mg into the skin every Saturday.   Vitamin D (Ergocalciferol) 1.25 MG (50000 UNIT) Caps capsule Commonly known as: DRISDOL Take 50,000 Units by  mouth every 7 (seven) days. Saturdays       Diagnostic Studies: CT SHOULDER RIGHT WO CONTRAST  Result Date: 04/12/2020 CLINICAL DATA:  Preop for total shoulder arthroplasty. Blueprint total shoulder. EXAM: CT OF THE UPPER RIGHT EXTREMITY WITHOUT CONTRAST TECHNIQUE: Multidetector CT imaging of the upper right extremity was performed according to the standard protocol. COMPARISON:  MRI 03/10/2020 FINDINGS: Moderate glenohumeral joint degenerative changes with cartilage loss, joint space narrowing and early spurring. There is a moderate sized focus of AVN involving the humeral head with partial cortical collapse. The cortex is fractured and slightly depressed. This measures a maximum 17.5 mm. Adjacent cystic change noted. Evidence of prior distal clavicle resection and acromioplasty. No findings for bony impingement. The visualized ribs are intact and the visualized right  lung is clear. Surgical changes from coronary artery bypass surgery are noted. Emphysematous changes noted in the right lung. The rotator cuff tendons are grossly intact by CT. IMPRESSION: 1. Moderate glenohumeral joint degenerative changes. 2. 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 3. Evidence of prior distal clavicle resection and acromioplasty. No findings for bony impingement. 4. The rotator cuff tendons are grossly intact by CT. Electronically Signed   By: Marijo Sanes M.D.   On: 04/12/2020 08:59   DG Shoulder Right Port  Result Date: 04/15/2020 CLINICAL DATA:  Status post right shoulder arthroplasty. EXAM: PORTABLE RIGHT SHOULDER COMPARISON:  04/12/2020 FINDINGS: Interval right hemiarthroplasty of the right shoulder. Hardware components are in anatomic alignment. No signs of periprosthetic fracture or subluxation. Stable postsurgical change from resection of the distal clavicle. IMPRESSION: Status post right hemiarthroplasty. Electronically Signed   By: Kerby Moors M.D.   On: 04/15/2020 12:23     Disposition: Discharge disposition: 01-Home or Self Care       Discharge Instructions    Call MD / Call 911   Complete by: As directed    If you experience chest pain or shortness of breath, CALL 911 and be transported to the hospital emergency room.  If you develope a fever above 101 F, pus (white drainage) or increased drainage or redness at the wound, or calf pain, call your surgeon's office.   Constipation Prevention   Complete by: As directed    Drink plenty of fluids.  Prune juice may be helpful.  You may use a stool softener, such as Colace (over the counter) 100 mg twice a day.  Use MiraLax (over the counter) for constipation as needed.   Diet - low sodium heart healthy   Complete by: As directed    Increase activity slowly as tolerated   Complete by: As directed        Follow-up Information    Tania Ade, MD. Schedule an appointment as soon as possible for a visit in 2 weeks.   Specialty: Orthopedic Surgery Contact information: [address] [place] [address] [phone]                Signed: Grier Mitts 05/03/2020, 1:35 PM

## 2020-05-04 ENCOUNTER — Other Ambulatory Visit: Payer: Self-pay | Admitting: *Deleted

## 2020-05-04 ENCOUNTER — Inpatient Hospital Stay: Admission: RE | Admit: 2020-05-04 | Payer: Medicare HMO | Source: Ambulatory Visit

## 2020-05-04 DIAGNOSIS — Z87891 Personal history of nicotine dependence: Secondary | ICD-10-CM

## 2020-05-04 DIAGNOSIS — F17210 Nicotine dependence, cigarettes, uncomplicated: Secondary | ICD-10-CM

## 2020-05-10 NOTE — Addendum Note (Signed)
Addendum  created 05/10/20 0931 by Myrtie Soman, MD   Clinical Note Signed, Intraprocedure Blocks edited

## 2021-01-24 ENCOUNTER — Other Ambulatory Visit: Payer: Self-pay | Admitting: Nurse Practitioner

## 2021-01-24 DIAGNOSIS — Z1231 Encounter for screening mammogram for malignant neoplasm of breast: Secondary | ICD-10-CM

## 2021-02-09 ENCOUNTER — Other Ambulatory Visit: Payer: Self-pay

## 2021-02-09 ENCOUNTER — Ambulatory Visit
Admission: RE | Admit: 2021-02-09 | Discharge: 2021-02-09 | Disposition: A | Discharge status: Home / Self Care | Payer: Medicare HMO | Source: Ambulatory Visit | Attending: Nurse Practitioner | Admitting: Nurse Practitioner

## 2021-02-09 DIAGNOSIS — Z1231 Encounter for screening mammogram for malignant neoplasm of breast: Secondary | ICD-10-CM

## 2021-03-22 ENCOUNTER — Encounter (HOSPITAL_COMMUNITY): Payer: Medicare HMO

## 2021-03-22 ENCOUNTER — Encounter: Payer: Medicare HMO | Admitting: Vascular Surgery

## 2021-08-24 ENCOUNTER — Other Ambulatory Visit: Payer: Self-pay | Admitting: Orthopedic Surgery

## 2021-09-01 ENCOUNTER — Other Ambulatory Visit: Payer: Self-pay | Admitting: Orthopedic Surgery

## 2021-09-01 DIAGNOSIS — M5412 Radiculopathy, cervical region: Secondary | ICD-10-CM

## 2021-09-02 ENCOUNTER — Ambulatory Visit
Admission: RE | Admit: 2021-09-02 | Discharge: 2021-09-02 | Disposition: A | Discharge status: Home / Self Care | Payer: Medicare HMO | Source: Ambulatory Visit | Attending: Orthopedic Surgery | Admitting: Orthopedic Surgery

## 2021-09-02 DIAGNOSIS — M5412 Radiculopathy, cervical region: Secondary | ICD-10-CM

## 2021-09-10 ENCOUNTER — Encounter (HOSPITAL_BASED_OUTPATIENT_CLINIC_OR_DEPARTMENT_OTHER): Payer: Self-pay | Admitting: Emergency Medicine

## 2021-09-10 DIAGNOSIS — L290 Pruritus ani: Secondary | ICD-10-CM | POA: Diagnosis present

## 2021-09-10 DIAGNOSIS — Z79899 Other long term (current) drug therapy: Secondary | ICD-10-CM | POA: Insufficient documentation

## 2021-09-10 NOTE — ED Triage Notes (Signed)
Reports rectal itching for the last two days.  Used a whole tube of preparation H with no relief.  Unsure of the cause per patient. ?

## 2021-09-11 ENCOUNTER — Emergency Department (HOSPITAL_BASED_OUTPATIENT_CLINIC_OR_DEPARTMENT_OTHER)
Admission: EM | Admit: 2021-09-11 | Discharge: 2021-09-11 | Disposition: A | Discharge status: Home / Self Care | Payer: Medicare HMO | Attending: Emergency Medicine | Admitting: Emergency Medicine

## 2021-09-11 DIAGNOSIS — L290 Pruritus ani: Secondary | ICD-10-CM

## 2021-09-11 MED ORDER — HYDROCORTISONE ACETATE 25 MG RE SUPP: 25.0000 mg | Freq: Two times a day (BID) | RECTAL | 0 refills | Status: AC

## 2021-09-11 MED ORDER — DIPHENHYDRAMINE HCL 25 MG PO TABS: 25.0000 mg | ORAL_TABLET | Freq: Four times a day (QID) | ORAL | 0 refills | Status: AC | PRN

## 2021-09-11 MED ORDER — LIDOCAINE VISCOUS HCL 2 % MT SOLN: 15.0000 mL | OROMUCOSAL | 0 refills | Status: AC | PRN

## 2021-09-11 NOTE — ED Provider Notes (Signed)
?[facility] ?Provider Note ? ? ?CSN: [id number] ?Arrival date & time: 09/10/21  2055 ? ?  ? ?History ? ?Chief Complaint  ?Patient presents with  ? Anal Itching  ? ? ?[name] [name] is a 61 y.o. female. ? ?H/o hemorrhoids and the last couple days has had intense itching. Tried prep h, no benadryl bedause pcp asked her not to use it 2/2 possible reactions. No anal intercourse. Blood sugars controlled. No recent illnesses. No h/o same.  ? ? ? ?  ? ?Home Medications ?Prior to Admission medications   ?Medication Sig Start Date End Date Taking? Authorizing Provider  ?diphenhydrAMINE (BENADRYL) 25 MG tablet Take 1 tablet (25 mg total) by mouth every 6 (six) hours as needed. 09/11/21  Yes Debria Broecker, Corene Cornea, MD  ?hydrocortisone (ANUSOL-HC) 25 MG suppository Place 1 suppository (25 mg total) rectally 2 (two) times daily. 09/11/21  Yes Fabyan Loughmiller, Corene Cornea, MD  ?lidocaine (XYLOCAINE) 2 % solution Use as directed 15 mLs in the mouth or throat as needed for mouth pain. 09/11/21  Yes Drayson Dorko, Corene Cornea, MD  ?Blood Glucose Calibration (ACCU-CHEK GUIDE CONTROL) LIQD 1 each by Does not apply route as needed. Use to calibrate glucometer prn; E11.9 07/19/18   Renato Shin, MD  ?carvedilol (COREG) 3.125 MG tablet TAKE 1 TABLET TWICE DAILY ?Patient taking differently: Take 3.125 mg by mouth 2 (two) times daily with a meal. TAKE 1 TABLET TWICE DAILY 04/28/19   Jacelyn Pi, Lilia Argue, MD  ?docusate sodium (COLACE) 100 MG capsule Take 100 mg by mouth 2 (two) times daily.    [provider]  ?Dulaglutide (TRULICITY) 3.23 FT/7.3UK SOPN Inject 0.75 mg into the skin every Saturday.    [provider]  ?DULoxetine (CYMBALTA) 60 MG capsule Take 60 mg by mouth 2 (two) times daily.    [provider]  ?Empagliflozin-metFORMIN HCl (SYNJARDY) 12.08-998 MG TABS Take 1 tablet by mouth in the morning and at bedtime.    [provider]  ?ezetimibe (ZETIA) 10 MG tablet Take 1 tablet (10 mg total) by  mouth daily. Please make overdue appt with Dr. Acie Fredrickson before anymore refills. 1st attempt ?Patient taking differently: Take 10 mg by mouth daily. 07/22/19   Nahser, Wonda Cheng, MD  ?famotidine (PEPCID) 20 MG tablet Take 1 tablet (20 mg total) by mouth 2 (two) times daily. 11/24/18   Daleen Squibb, MD  ?fenofibrate (TRICOR) 145 MG tablet Take 1 tablet (145 mg total) by mouth daily. Please make overdue appt with Dr. Acie Fredrickson before anymore refills. 1st attempt ?Patient taking differently: Take 145 mg by mouth daily. 12/05/19   Nahser, Wonda Cheng, MD  ?fluticasone (FLONASE) 50 MCG/ACT nasal spray Place 1 spray into both nostrils at bedtime as needed for allergies or rhinitis.    [provider]  ?glucose blood (ACCU-CHEK GUIDE) test strip Use to check blood sugar 3 times daily. 07/22/18   Renato Shin, MD  ?Ipratropium-Albuterol (COMBIVENT RESPIMAT) 20-100 MCG/ACT AERS respimat Inhale 1 puff into the lungs every 6 (six) hours. ?Patient taking differently: Inhale 1 puff into the lungs every 6 (six) hours as needed for wheezing or shortness of breath. 05/10/17   Shawnee Knapp, MD  ?Lancets Misc. (ACCU-CHEK FASTCLIX LANCET) KIT 1 each by Does not apply route 3 (three) times daily. Use to monitor glucose levels TID; E11.9 07/19/18   Renato Shin, MD  ?levothyroxine (SYNTHROID) 175 MCG tablet Take 175 mcg by mouth daily before breakfast.    [provider]  ?  loratadine (CLARITIN) 10 MG tablet Take 1 tablet (10 mg total) by mouth daily. ?Patient taking differently: Take 10 mg by mouth daily as needed for allergies. 09/25/18   Daleen Squibb, MD  ?omeprazole (PRILOSEC) 40 MG capsule TAKE 1 CAPSULE DAILY 30 MINUTES BEFORE A MEAL ?Patient taking differently: Take 40 mg by mouth daily as needed (for acid reflux take 30 minutes before a meal). 09/16/18   Daleen Squibb, MD  ?Oxycodone HCl 10 MG TABS Take 1 tablet by mouth every 4 (four) hours as needed (pain).  07/04/18   [provider]   ?pregabalin (LYRICA) 150 MG capsule Take 100 mg by mouth 3 (three) times daily.  07/04/18   [provider]  ?promethazine-dextromethorphan (PROMETHAZINE-DM) 6.25-15 MG/5ML syrup Take 5 mLs by mouth 3 (three) times daily as needed for cough.  07/24/19   [provider]  ?rosuvastatin (CRESTOR) 40 MG tablet Take 40 mg by mouth daily.    [provider]  ?tiZANidine (ZANAFLEX) 4 MG tablet Take 4 mg by mouth every 6 (six) hours as needed for muscle spasms.    [provider]  ?Vitamin D, Ergocalciferol, (DRISDOL) 1.25 MG (50000 UNIT) CAPS capsule Take 50,000 Units by mouth every 7 (seven) days. Saturdays    [provider]  ?   ? ?Allergies    ?Latuda [lurasidone hcl], Coconut flavor, Codeine, Ibuprofen, Prednisone, Cyclobenzaprine, Meloxicam, and Morphine   ? ?Review of Systems   ?Review of Systems ? ?Physical Exam ?Updated Vital Signs ?BP (!) 144/66 (BP Location: Right Arm)   Pulse 69   Temp 98.4 ?F (36.9 ?C) (Oral)   Resp 18   Ht 5' 1"  (1.549 m)   Wt 66.3 kg   SpO2 91%   BMI 27.62 kg/m?  ?Physical Exam ?Vitals and nursing note reviewed.  ?Constitutional:   ?   Appearance: She is well-developed.  ?HENT:  ?   Head: Normocephalic and atraumatic.  ?   Mouth/Throat:  ?   Mouth: Mucous membranes are moist.  ?Cardiovascular:  ?   Rate and Rhythm: Normal rate and regular rhythm.  ?Pulmonary:  ?   Effort: No respiratory distress.  ?   Breath sounds: No stridor.  ?Abdominal:  ?   General: Abdomen is flat. There is no distension.  ?   Tenderness: There is no abdominal tenderness.  ?Genitourinary: ?   Comments: Chaperoned by nurse: [name]. ? ?Has skin tags, a couple hemorrhoids but also has an area of small papules that are mostly skin colored with a dark area in the center of them. No obvious swelling, redness, or ttp in those areas.  ?Musculoskeletal:  ?   Cervical back: Normal range of motion.  ?Skin: ?   General: Skin is warm and dry.  ?Neurological:  ?   General: No focal  deficit present.  ?   Mental Status: She is alert.  ? ? ?ED Results / Procedures / Treatments   ?Labs ?(all labs ordered are listed, but only abnormal results are displayed) ?Labs Reviewed - No data to display ? ?EKG ?None ? ?Radiology ?No results found. ? ?Procedures ?Procedures  ? ? ?Medications Ordered in ED ?Medications - No data to display ? ?ED Course/ Medical Decision Making/ A&P ?  ?                        ?Medical Decision Making ?Risk ?OTC drugs. ?Prescription drug management. ? ? ?We will treat symptomatically at home  with PCP/GI follow-up.  Unclear on the actual cause for itching.  We will try some viscous lidocaine and Anusol suppositories along with some as needed Benadryl.  I discussed the multiple sedating medications that she is on and to be careful with using them too close to each other.  She expressed understanding. ? ? ?Final Clinical Impression(s) / ED Diagnoses ?Final diagnoses:  ?Anal itching  ? ? ?Rx / DC Orders ?ED Discharge Orders   ? ?      Ordered  ?  hydrocortisone (ANUSOL-HC) 25 MG suppository  2 times daily       ? 09/11/21 0218  ?  diphenhydrAMINE (BENADRYL) 25 MG tablet  Every 6 hours PRN       ? 09/11/21 0218  ?  lidocaine (XYLOCAINE) 2 % solution  As needed       ? 09/11/21 0218  ? ?  ?  ? ?  ? ? ?  ?Lamiyah Schlotter, Corene Cornea, MD ?09/11/21 0222 ? ?

## 2021-09-27 NOTE — Pre-Procedure Instructions (Signed)
Surgical Instructions    Your procedure is scheduled on Wednesday, June 7th.  Report to [facility] Main Entrance "A" at 6:30 A.M., then check in with the Admitting office.  Call this number if you have problems the morning of surgery:  [phone]   If you have any questions prior to your surgery date call [phone]: Open Monday-Friday 8am-4pm    Remember:  Do not eat after midnight the night before your surgery  You may drink clear liquids until 5:30 a.m. the morning of your surgery.   Clear liquids allowed are: Water, Non-Citrus Juices (without pulp), Carbonated Beverages, Clear Tea, Black Coffee ONLY (NO MILK, CREAM OR POWDERED CREAMER of any kind), and Gatorade.  Patient Instructions   The day of surgery (if you have diabetes):  Drink ONE small 12 oz bottle of Gatorade G2 by 5:30 am the morning of surgery This bottle was given to you during your hospital  pre-op appointment visit.  Nothing else to drink after completing the  Small 12 oz bottle of Gatorade G2.         If you have questions, please contact your surgeon's office.     Take these medicines the morning of surgery with A SIP OF WATER:   carvedilol (COREG)  DULoxetine (CYMBALTA)  ezetimibe (ZETIA)  famotidine (PEPCID)  fenofibrate (TRICOR) levothyroxine (SYNTHROID) omeprazole (PRILOSEC)  pregabalin (LYRICA)  rosuvastatin (CRESTOR)  As needed: nitroGLYCERIN (NITROSTAT)-please let a nurse know if you had to use this.  Oxycodone HCl  tiZANidine (ZANAFLEX)   Follow your surgeon's instructions on when to stop Aspirin.  If no instructions were given by your surgeon then you will need to call the office to get those instructions.    As of today, STOP taking any Aspirin (unless otherwise instructed by your surgeon) Aleve, Naproxen, Ibuprofen, Motrin, Advil, Goody's, BC's, all herbal medications, fish oil, and all vitamins.  WHAT DO I DO ABOUT MY DIABETES MEDICATION?   Do not take Empagliflozin-metFORMIN  HCl [facility]) on Tuesday (6/6) or Wednesday (6/7).  The day of surgery, do not take other diabetes injectables, including Trulicity (dulaglutide).  HOW TO MANAGE YOUR DIABETES BEFORE AND AFTER SURGERY  Why is it important to control my blood sugar before and after surgery? Improving blood sugar levels before and after surgery helps healing and can limit problems. A way of improving blood sugar control is eating a healthy diet by:  Eating less sugar and carbohydrates  Increasing activity/exercise  Talking with your doctor about reaching your blood sugar goals High blood sugars (greater than 180 mg/dL) can raise your risk of infections and slow your recovery, so you will need to focus on controlling your diabetes during the weeks before surgery. Make sure that the doctor who takes care of your diabetes knows about your planned surgery including the date and location.  How do I manage my blood sugar before surgery? Check your blood sugar at least 4 times a day, starting 2 days before surgery, to make sure that the level is not too high or low.  Check your blood sugar the morning of your surgery when you wake up and every 2 hours until you get to the Short Stay unit.  If your blood sugar is less than 70 mg/dL, you will need to treat for low blood sugar: Do not take insulin. Treat a low blood sugar (less than 70 mg/dL) with  cup of clear juice (cranberry or apple), 4 glucose tablets, OR glucose gel. Recheck blood sugar in 15 minutes  after treatment (to make sure it is greater than 70 mg/dL). If your blood sugar is not greater than 70 mg/dL on recheck, call [phone] for further instructions. Report your blood sugar to the short stay nurse when you get to Short Stay.  If you are admitted to the hospital after surgery: Your blood sugar will be checked by the staff and you will probably be given insulin after surgery (instead of oral diabetes medicines) to make sure you have good blood sugar  levels. The goal for blood sugar control after surgery is 80-180 mg/dL.           Do not wear jewelry or makeup Do not wear lotions, powders, perfumes, or deodorant. Do not shave 48 hours prior to surgery.  Do not bring valuables to the hospital. Do not wear nail polish, gel polish, artificial nails, or any other type of covering on natural nails (fingers and toes) If you have artificial nails or gel coating that need to be removed by a nail salon, please have this removed prior to surgery. Artificial nails or gel coating may interfere with anesthesia's ability to adequately monitor your vital signs.  [facility] is not responsible for any belongings or valuables. .   Do NOT Smoke (Tobacco/Vaping)  24 hours prior to your procedure  If you use a CPAP at night, you may bring your mask for your overnight stay.   Contacts, glasses, hearing aids, dentures or partials may not be worn into surgery, please bring cases for these belongings   For patients admitted to the hospital, discharge time will be determined by your treatment team.   Patients discharged the day of surgery will not be allowed to drive home, and someone needs to stay with them for 24 hours.   SURGICAL WAITING ROOM VISITATION Patients having surgery or a procedure in a hospital may have two support people. Children under the age of 53 must have an adult with them who is not the patient. They may stay in the waiting area during the procedure and may switch out with other visitors. If the patient needs to stay at the hospital during part of their recovery, the visitor guidelines for inpatient rooms apply.  Please refer to the [facility] website for the visitor guidelines for Inpatients (after your surgery is over and you are in a regular room).       Special instructions:    Oral Hygiene is also important to reduce your risk of infection.  Remember - BRUSH YOUR TEETH THE MORNING OF SURGERY WITH YOUR REGULAR  TOOTHPASTE   [facility]- Preparing For Surgery  Before surgery, you can play an important role. Because skin is not sterile, your skin needs to be as free of germs as possible. You can reduce the number of germs on your skin by washing with CHG (chlorahexidine gluconate) Soap before surgery.  CHG is an antiseptic cleaner which kills germs and bonds with the skin to continue killing germs even after washing.     Please do not use if you have an allergy to CHG or antibacterial soaps. If your skin becomes reddened/irritated stop using the CHG.  Do not shave (including legs and underarms) for at least 48 hours prior to first CHG shower. It is OK to shave your face.  Please follow these instructions carefully.     Shower the NIGHT BEFORE SURGERY and the MORNING OF SURGERY with CHG Soap.   If you chose to wash your hair, wash your hair first  as usual with your normal shampoo. After you shampoo, rinse your hair and body thoroughly to remove the shampoo.  Then [employer] and genitals (private parts) with your normal soap and rinse thoroughly to remove soap.  After that Use CHG Soap as you would any other liquid soap. You can apply CHG directly to the skin and wash gently with a scrungie or a clean washcloth.   Apply the CHG Soap to your body ONLY FROM THE NECK DOWN.  Do not use on open wounds or open sores. Avoid contact with your eyes, ears, mouth and genitals (private parts). Wash Face and genitals (private parts)  with your normal soap.   Wash thoroughly, paying special attention to the area where your surgery will be performed.  Thoroughly rinse your body with warm water from the neck down.  DO NOT shower/wash with your normal soap after using and rinsing off the CHG Soap.  Pat yourself dry with a CLEAN TOWEL.  Wear CLEAN PAJAMAS to bed the night before surgery  Place CLEAN SHEETS on your bed the night before your surgery  DO NOT SLEEP WITH PETS.   Day of Surgery:  Take a shower  with CHG soap. Wear Clean/Comfortable clothing the morning of surgery Do not apply any deodorants/lotions.   Remember to brush your teeth WITH YOUR REGULAR TOOTHPASTE.    If you received a COVID test during your pre-op visit, it is requested that you wear a mask when out in public, stay away from anyone that may not be feeling well, and notify your surgeon if you develop symptoms. If you have been in contact with anyone that has tested positive in the last 10 days, please notify your surgeon.    Please read over the following fact sheets that you were given.

## 2021-09-28 ENCOUNTER — Encounter (HOSPITAL_COMMUNITY)
Admission: RE | Admit: 2021-09-28 | Discharge: 2021-09-28 | Disposition: A | Discharge status: Home / Self Care | Payer: Medicare HMO | Source: Ambulatory Visit | Attending: Orthopedic Surgery | Admitting: Orthopedic Surgery

## 2021-09-28 ENCOUNTER — Encounter (HOSPITAL_COMMUNITY): Payer: Self-pay

## 2021-09-28 ENCOUNTER — Other Ambulatory Visit: Payer: Self-pay

## 2021-09-28 VITALS — BP 93/58 | HR 65 | Temp 98.3°F | Resp 17 | Ht 61.0 in | Wt 145.9 lb

## 2021-09-28 DIAGNOSIS — Z01818 Encounter for other preprocedural examination: Secondary | ICD-10-CM | POA: Diagnosis present

## 2021-09-28 DIAGNOSIS — E1151 Type 2 diabetes mellitus with diabetic peripheral angiopathy without gangrene: Secondary | ICD-10-CM

## 2021-09-28 HISTORY — DX: Pneumonia, unspecified organism: J18.9

## 2021-09-28 LAB — CBC
HCT: 49 % — ABNORMAL HIGH (ref 36.0–46.0)
Hemoglobin: 15.6 g/dL — ABNORMAL HIGH (ref 12.0–15.0)
MCH: 28.3 pg (ref 26.0–34.0)
MCHC: 31.8 g/dL (ref 30.0–36.0)
MCV: 88.9 fL (ref 80.0–100.0)
Platelets: 237 10*3/uL (ref 150–400)
RBC: 5.51 MIL/uL — ABNORMAL HIGH (ref 3.87–5.11)
RDW: 14.8 % (ref 11.5–15.5)
WBC: 9.8 10*3/uL (ref 4.0–10.5)
nRBC: 0 % (ref 0.0–0.2)

## 2021-09-28 LAB — TYPE AND SCREEN
ABO/RH(D): O POS
Antibody Screen: NEGATIVE

## 2021-09-28 LAB — BASIC METABOLIC PANEL
Anion gap: 7 (ref 5–15)
BUN: 12 mg/dL (ref 6–20)
CO2: 27 mmol/L (ref 22–32)
Calcium: 9.3 mg/dL (ref 8.9–10.3)
Chloride: 107 mmol/L (ref 98–111)
Creatinine, Ser: 0.82 mg/dL (ref 0.44–1.00)
GFR, Estimated: >60 mL/min (ref >60)
Glucose, Bld: 150 mg/dL — ABNORMAL HIGH (ref 70–99)
Potassium: 3.8 mmol/L (ref 3.5–5.1)
Sodium: 141 mmol/L (ref 135–145)

## 2021-09-28 LAB — SURGICAL PCR SCREEN
MRSA, PCR: NEGATIVE
Staphylococcus aureus: NEGATIVE

## 2021-09-28 LAB — HEMOGLOBIN A1C
Hgb A1c MFr Bld: 7.5 % — ABNORMAL HIGH (ref 4.8–5.6)
Mean Plasma Glucose: 168.55 mg/dL

## 2021-09-28 LAB — GLUCOSE, CAPILLARY: Glucose-Capillary: 216 mg/dL — ABNORMAL HIGH (ref 70–99)

## 2021-09-28 NOTE — Pre-Procedure Instructions (Addendum)
Surgical Instructions    Your procedure is scheduled on Wednesday, June 7th.  Report to [facility] Main Entrance "A" at 6:30 A.M., then check in with the Admitting office.  Call this number if you have problems the morning of surgery:  [phone]   If you have any questions prior to your surgery date call [phone]: Open Monday-Friday 8am-4pm    Remember:  Do not eat after midnight the night before your surgery  You may drink clear liquids until 5:30 a.m. the morning of your surgery.   Clear liquids allowed are: Water, Non-Citrus Juices (without pulp), Carbonated Beverages, Clear Tea, Black Coffee ONLY (NO MILK, CREAM OR POWDERED CREAMER of any kind), and Gatorade.  Patient Instructions   The day of surgery (if you have diabetes):  Drink ONE small 12 oz bottle of Gatorade G2 by 5:30 am the morning of surgery This bottle was given to you during your hospital  pre-op appointment visit.  Nothing else to drink after completing the  Small 12 oz bottle of Gatorade G2.         If you have questions, please contact your surgeon's office.     Take these medicines the morning of surgery with A SIP OF WATER:   carvedilol (COREG)  DULoxetine (CYMBALTA)  ezetimibe (ZETIA)  famotidine (PEPCID)  fenofibrate (TRICOR) levothyroxine (SYNTHROID) omeprazole (PRILOSEC)  pregabalin (LYRICA)  rosuvastatin (CRESTOR)  As needed: nitroGLYCERIN (NITROSTAT)-please let a nurse know if you had to use this.  Oxycodone HCl  tiZANidine (ZANAFLEX)   Follow your surgeon's instructions on when to stop Aspirin.  If no instructions were given by your surgeon then you will need to call the office to get those instructions.    As of today, STOP taking any Aspirin (unless otherwise instructed by your surgeon) Aleve, Naproxen, Ibuprofen, Motrin, Advil, Goody's, BC's, all herbal medications, fish oil, and all vitamins.  WHAT DO I DO ABOUT MY DIABETES MEDICATION?   Do not take Empagliflozin-metFORMIN  HCl [facility]) on Tuesday (6/6) or Wednesday (6/7).  The day of surgery, do not take other diabetes injectables, including Trulicity (dulaglutide).  HOW TO MANAGE YOUR DIABETES BEFORE AND AFTER SURGERY  Why is it important to control my blood sugar before and after surgery? Improving blood sugar levels before and after surgery helps healing and can limit problems. A way of improving blood sugar control is eating a healthy diet by:  Eating less sugar and carbohydrates  Increasing activity/exercise  Talking with your doctor about reaching your blood sugar goals High blood sugars (greater than 180 mg/dL) can raise your risk of infections and slow your recovery, so you will need to focus on controlling your diabetes during the weeks before surgery. Make sure that the doctor who takes care of your diabetes knows about your planned surgery including the date and location.  How do I manage my blood sugar before surgery? Check your blood sugar at least 4 times a day, starting 2 days before surgery, to make sure that the level is not too high or low.  Check your blood sugar the morning of your surgery when you wake up and every 2 hours until you get to the Short Stay unit.  If your blood sugar is less than 70 mg/dL, you will need to treat for low blood sugar: Do not take insulin. Treat a low blood sugar (less than 70 mg/dL) with  cup of clear juice (cranberry or apple), 4 glucose tablets, OR glucose gel. Recheck blood sugar in 15 minutes  after treatment (to make sure it is greater than 70 mg/dL). If your blood sugar is not greater than 70 mg/dL on recheck, call [phone] for further instructions. Report your blood sugar to the short stay nurse when you get to Short Stay.  If you are admitted to the hospital after surgery: Your blood sugar will be checked by the staff and you will probably be given insulin after surgery (instead of oral diabetes medicines) to make sure you have good blood sugar  levels. The goal for blood sugar control after surgery is 80-180 mg/dL.           Do not wear jewelry or makeup Do not wear lotions, powders, perfumes, or deodorant. Do not shave 48 hours prior to surgery.  Do not bring valuables to the hospital. Do not wear nail polish, gel polish, artificial nails, or any other type of covering on natural nails (fingers and toes) If you have artificial nails or gel coating that need to be removed by a nail salon, please have this removed prior to surgery. Artificial nails or gel coating may interfere with anesthesia's ability to adequately monitor your vital signs.  [facility] is not responsible for any belongings or valuables. .   Do NOT Smoke (Tobacco/Vaping)  24 hours prior to your procedure  If you use a CPAP at night, you may bring your mask for your overnight stay.   Contacts, glasses, hearing aids, dentures or partials may not be worn into surgery, please bring cases for these belongings   For patients admitted to the hospital, discharge time will be determined by your treatment team.   Patients discharged the day of surgery will not be allowed to drive home, and someone needs to stay with them for 24 hours.   SURGICAL WAITING ROOM VISITATION Patients having surgery or a procedure in a hospital may have two support people. Children under the age of 42 must have an adult with them who is not the patient. They may stay in the waiting area during the procedure and may switch out with other visitors. If the patient needs to stay at the hospital during part of their recovery, the visitor guidelines for inpatient rooms apply.  Please refer to the [facility] website for the visitor guidelines for Inpatients (after your surgery is over and you are in a regular room).       Special instructions:    Oral Hygiene is also important to reduce your risk of infection.  Remember - BRUSH YOUR TEETH THE MORNING OF SURGERY WITH YOUR REGULAR  TOOTHPASTE   [place]- Preparing For Surgery  Before surgery, you can play an important role. Because skin is not sterile, your skin needs to be as free of germs as possible. You can reduce the number of germs on your skin by washing with CHG (chlorahexidine gluconate) Soap before surgery.  CHG is an antiseptic cleaner which kills germs and bonds with the skin to continue killing germs even after washing.     Please do not use if you have an allergy to CHG or antibacterial soaps. If your skin becomes reddened/irritated stop using the CHG.  Do not shave (including legs and underarms) for at least 48 hours prior to first CHG shower. It is OK to shave your face.  Please follow these instructions carefully.     Shower the NIGHT BEFORE SURGERY and the MORNING OF SURGERY with CHG Soap.   If you chose to wash your hair, wash your hair first  as usual with your normal shampoo. After you shampoo, rinse your hair and body thoroughly to remove the shampoo.  Then [employer] and genitals (private parts) with your normal soap and rinse thoroughly to remove soap.  After that Use CHG Soap as you would any other liquid soap. You can apply CHG directly to the skin and wash gently with a scrungie or a clean washcloth.   Apply the CHG Soap to your body ONLY FROM THE NECK DOWN.  Do not use on open wounds or open sores. Avoid contact with your eyes, ears, mouth and genitals (private parts). Wash Face and genitals (private parts)  with your normal soap.   Wash thoroughly, paying special attention to the area where your surgery will be performed.  Thoroughly rinse your body with warm water from the neck down.  DO NOT shower/wash with your normal soap after using and rinsing off the CHG Soap.  Pat yourself dry with a CLEAN TOWEL.  Wear CLEAN PAJAMAS to bed the night before surgery  Place CLEAN SHEETS on your bed the night before your surgery  DO NOT SLEEP WITH PETS.   Day of Surgery:  Take a shower  with CHG soap. Wear Clean/Comfortable clothing the morning of surgery Do not apply any deodorants/lotions.   Remember to brush your teeth WITH YOUR REGULAR TOOTHPASTE.    If you received a COVID test during your pre-op visit, it is requested that you wear a mask when out in public, stay away from anyone that may not be feeling well, and notify your surgeon if you develop symptoms. If you have been in contact with anyone that has tested positive in the last 10 days, please notify your surgeon.    Please read over the following fact sheets that you were given.

## 2021-09-28 NOTE — Progress Notes (Signed)
PCP - Simona Huh NP Cardiologist - Dr. Tonia Ghent Pulm: Dr. Eric Form Endocrine: Dr. Jacolyn Reedy GI: Dr. Harl Bowie  PPM/ICD - Denies  Chest x-ray - N/A EKG - 09/28/21 Stress Test - 2014 ECHO - 2013 Cardiac Cath - Denies  Sleep Study - OSA CPAP - No  Fasting Blood Sugar - 80-100 Checks Blood Sugar at least once per day  Blood Thinner Instructions: N/A Aspirin Instructions: LD: 09/27/21  ERAS Protcol - Yes, G2   COVID TEST- N/A   Anesthesia review: Yes, cardiac hx  Patient denies shortness of breath, fever, cough and chest pain at PAT appointment   All instructions explained to the patient, with a verbal understanding of the material. Patient agrees to go over the instructions while at home for a better understanding. Patient also instructed to self quarantine after being tested for COVID-19. The opportunity to ask questions was provided.

## 2021-09-29 NOTE — Progress Notes (Signed)
Anesthesia Chart Review:  Patient follows with cardiologist Dr. Edson Snowball at [facility] for history of CAD status post CABG x 3 in 2008.  Echo 07/16/2019 showed EF 60 to 65%,  normal valves.  Cardiac clearance from Dr. Edson Snowball states patient is low risk and has no need for CV testing prior to surgery.  Copy of clearance on patient's chart.   Follows with endocrinology at [facility] for history of multinodular goiter s/p RAI and non-insulin-dependent DM2.  A1c 7.5 on preop labs.   Patient is a current everyday smoker. Spirometry 03/24/17 done by PCP was nromal.   Preop labs reviewed, unremarkable.   EKG 09/28/21: Normal sinus rhythm.  Rate 60.  Possible inferior infarct, age undetermined.   TTE 07/16/2019 ([place]): Conclusions: 1.  Normal study: Normal cardiac chamber sizes and function; normal valve anatomy and function; no pericardial effusion or intracardiac mass.  No intracardiac shunts by 2-dimensional color flow imaging.  Normal thoracic aorta and aortic arch.  EF 60-65%.     Wynonia Musty [facility] Short Stay Center/Anesthesiology Phone [phone] 10/03/2021 12:26 PM

## 2021-10-03 NOTE — Anesthesia Preprocedure Evaluation (Signed)
Anesthesia Evaluation  Patient identified by MRN, date of birth, ID band Patient awake    Reviewed: Allergy & Precautions, NPO status , Patient's Chart, lab work & pertinent test results, reviewed documented beta blocker date and time   History of Anesthesia Complications Negative for: history of anesthetic complications  Airway Mallampati: II  TM Distance: >3 FB Neck ROM: Full    Dental  (+) Dental Advisory Given   Pulmonary sleep apnea (does not use her CPAP) , Current SmokerPatient did not abstain from smoking.,    breath sounds clear to auscultation       Cardiovascular hypertension, Pt. on medications and Pt. on home beta blockers (-) angina+ CAD and + CABG   Rhythm:Regular Rate:Normal  '21 ECHO: EF 60-65%, normal LVF, normal valves   Neuro/Psych Anxiety Depression Bipolar Disorder Chronic back pain    GI/Hepatic Neg liver ROS, GERD  Medicated and Controlled,  Endo/Other  diabetes (glu 127), Oral Hypoglycemic AgentsHypothyroidism   Renal/GU negative Renal ROS     Musculoskeletal  (+) Arthritis ,   Abdominal   Peds  Hematology negative hematology ROS (+)   Anesthesia Other Findings   Reproductive/Obstetrics                           Anesthesia Physical Anesthesia Plan  ASA: 3  Anesthesia Plan: General   Post-op Pain Management: Tylenol PO (pre-op)*   Induction: Intravenous  PONV Risk Score and Plan: 2 and Ondansetron and Dexamethasone  Airway Management Planned: Oral ETT  Additional Equipment: None  Intra-op Plan:   Post-operative Plan: Extubation in OR  Informed Consent: I have reviewed the patients History and Physical, chart, labs and discussed the procedure including the risks, benefits and alternatives for the proposed anesthesia with the patient or authorized representative who has indicated his/her understanding and acceptance.     Dental advisory given  Plan  Discussed with: CRNA and Surgeon  Anesthesia Plan Comments: (PAT note by Karoline Caldwell, PA-C: Patient follows with cardiologist Dr. Edson Snowball at [facility] for history of CAD status post CABGx 3 in 2008.  Echo 07/16/2019 showed EF 60 to 65%,  normal valves.  Cardiac clearance from Dr. Edson Snowball states patient is low risk and has no need for CV testing prior to surgery.  Copy of clearance on patient's chart.  Follows with endocrinology at [facility] for history of multinodular goiter s/p RAI and non-insulin-dependent DM2.  A1c 7.5 on preop labs.  Patient is a current everyday smoker.Spirometry 03/24/17 done by PCP was nromal.  Preop labs reviewed, unremarkable.  EKG 09/28/21:Normal sinus rhythm. Rate 60.  Possible inferior infarct, age undetermined.  TTE 07/16/2019 ([place]): Conclusions: 1. Normal study: Normal cardiac chamber sizes and function; normal valve anatomy and function; no pericardial effusion or intracardiac mass. No intracardiac shunts by 2-dimensional color flow imaging. Normal thoracic aorta and aortic arch. EF 60-65%. )      Anesthesia Quick Evaluation

## 2021-10-05 ENCOUNTER — Other Ambulatory Visit: Payer: Self-pay

## 2021-10-05 ENCOUNTER — Encounter (HOSPITAL_COMMUNITY): Payer: Self-pay | Admitting: Orthopedic Surgery

## 2021-10-05 ENCOUNTER — Ambulatory Visit (HOSPITAL_COMMUNITY): Payer: Medicare HMO | Admitting: Physician Assistant

## 2021-10-05 ENCOUNTER — Ambulatory Visit (HOSPITAL_COMMUNITY): Payer: Medicare HMO

## 2021-10-05 ENCOUNTER — Encounter (HOSPITAL_COMMUNITY)
Admission: RE | Disposition: A | Discharge status: Home / Self Care | Payer: Self-pay | Source: Ambulatory Visit | Attending: Orthopedic Surgery

## 2021-10-05 ENCOUNTER — Ambulatory Visit (HOSPITAL_COMMUNITY)
Admission: RE | Admit: 2021-10-05 | Discharge: 2021-10-05 | Disposition: A | Discharge status: Home / Self Care | Payer: Medicare HMO | Source: Ambulatory Visit | Attending: Orthopedic Surgery | Admitting: Orthopedic Surgery

## 2021-10-05 ENCOUNTER — Ambulatory Visit (HOSPITAL_BASED_OUTPATIENT_CLINIC_OR_DEPARTMENT_OTHER): Payer: Medicare HMO | Admitting: Anesthesiology

## 2021-10-05 DIAGNOSIS — M549 Dorsalgia, unspecified: Secondary | ICD-10-CM | POA: Diagnosis not present

## 2021-10-05 DIAGNOSIS — I2510 Atherosclerotic heart disease of native coronary artery without angina pectoris: Secondary | ICD-10-CM | POA: Insufficient documentation

## 2021-10-05 DIAGNOSIS — E119 Type 2 diabetes mellitus without complications: Secondary | ICD-10-CM | POA: Diagnosis not present

## 2021-10-05 DIAGNOSIS — I10 Essential (primary) hypertension: Secondary | ICD-10-CM | POA: Diagnosis not present

## 2021-10-05 DIAGNOSIS — F419 Anxiety disorder, unspecified: Secondary | ICD-10-CM | POA: Insufficient documentation

## 2021-10-05 DIAGNOSIS — F17210 Nicotine dependence, cigarettes, uncomplicated: Secondary | ICD-10-CM | POA: Diagnosis not present

## 2021-10-05 DIAGNOSIS — E039 Hypothyroidism, unspecified: Secondary | ICD-10-CM | POA: Diagnosis not present

## 2021-10-05 DIAGNOSIS — K219 Gastro-esophageal reflux disease without esophagitis: Secondary | ICD-10-CM | POA: Diagnosis not present

## 2021-10-05 DIAGNOSIS — F32A Depression, unspecified: Secondary | ICD-10-CM | POA: Insufficient documentation

## 2021-10-05 DIAGNOSIS — Z79899 Other long term (current) drug therapy: Secondary | ICD-10-CM | POA: Insufficient documentation

## 2021-10-05 DIAGNOSIS — Z7984 Long term (current) use of oral hypoglycemic drugs: Secondary | ICD-10-CM | POA: Insufficient documentation

## 2021-10-05 DIAGNOSIS — Z951 Presence of aortocoronary bypass graft: Secondary | ICD-10-CM | POA: Insufficient documentation

## 2021-10-05 DIAGNOSIS — G4730 Sleep apnea, unspecified: Secondary | ICD-10-CM | POA: Insufficient documentation

## 2021-10-05 DIAGNOSIS — M533 Sacrococcygeal disorders, not elsewhere classified: Secondary | ICD-10-CM | POA: Insufficient documentation

## 2021-10-05 DIAGNOSIS — G8929 Other chronic pain: Secondary | ICD-10-CM | POA: Insufficient documentation

## 2021-10-05 DIAGNOSIS — E1151 Type 2 diabetes mellitus with diabetic peripheral angiopathy without gangrene: Secondary | ICD-10-CM

## 2021-10-05 HISTORY — PX: SACROILIAC JOINT FUSION: SHX6088

## 2021-10-05 LAB — GLUCOSE, CAPILLARY
Glucose-Capillary: 127 mg/dL — ABNORMAL HIGH (ref 70–99)
Glucose-Capillary: 155 mg/dL — ABNORMAL HIGH (ref 70–99)

## 2021-10-05 SURGERY — SACROILIAC JOINT FUSION
Anesthesia: General | Laterality: Right

## 2021-10-05 MED ORDER — MEPERIDINE HCL 25 MG/ML IJ SOLN: 6.2500 mg | INTRAMUSCULAR | Status: DC | PRN

## 2021-10-05 MED ORDER — HYDROMORPHONE HCL 1 MG/ML IJ SOLN: 0.2500 mg | INTRAMUSCULAR | Status: DC | PRN

## 2021-10-05 MED ORDER — ALBUTEROL SULFATE (2.5 MG/3ML) 0.083% IN NEBU
2.5000 mg | INHALATION_SOLUTION | Freq: Four times a day (QID) | RESPIRATORY_TRACT | Status: DC | PRN
  Administered 2021-10-05: 2.5 mg via RESPIRATORY_TRACT

## 2021-10-05 MED ORDER — MIDAZOLAM HCL 2 MG/2ML IJ SOLN
INTRAMUSCULAR | Status: AC
  Filled 2021-10-05: qty 2

## 2021-10-05 MED ORDER — HYDROCODONE-ACETAMINOPHEN 5-325 MG PO TABS: 1.0000 | ORAL_TABLET | ORAL | 0 refills | Status: AC | PRN

## 2021-10-05 MED ORDER — ORAL CARE MOUTH RINSE: 15.0000 mL | Freq: Once | OROMUCOSAL | Status: AC

## 2021-10-05 MED ORDER — PHENYLEPHRINE 80 MCG/ML (10ML) SYRINGE FOR IV PUSH (FOR BLOOD PRESSURE SUPPORT)
PREFILLED_SYRINGE | INTRAVENOUS | Status: DC | PRN
  Administered 2021-10-05: 160 ug via INTRAVENOUS
  Administered 2021-10-05: 320 ug via INTRAVENOUS

## 2021-10-05 MED ORDER — PROPOFOL 10 MG/ML IV BOLUS
INTRAVENOUS | Status: DC | PRN
  Administered 2021-10-05: 120 mg via INTRAVENOUS

## 2021-10-05 MED ORDER — ALBUTEROL SULFATE HFA 108 (90 BASE) MCG/ACT IN AERS
INHALATION_SPRAY | RESPIRATORY_TRACT | Status: DC | PRN
  Administered 2021-10-05: 2 via RESPIRATORY_TRACT

## 2021-10-05 MED ORDER — MIDAZOLAM HCL 5 MG/5ML IJ SOLN
INTRAMUSCULAR | Status: DC | PRN
  Administered 2021-10-05: 2 mg via INTRAVENOUS

## 2021-10-05 MED ORDER — MIDAZOLAM HCL 2 MG/2ML IJ SOLN: 0.5000 mg | Freq: Once | INTRAMUSCULAR | Status: DC | PRN

## 2021-10-05 MED ORDER — ROCURONIUM BROMIDE 10 MG/ML (PF) SYRINGE
PREFILLED_SYRINGE | INTRAVENOUS | Status: DC | PRN
  Administered 2021-10-05: 60 mg via INTRAVENOUS

## 2021-10-05 MED ORDER — ONDANSETRON HCL 4 MG/2ML IJ SOLN
INTRAMUSCULAR | Status: DC | PRN
  Administered 2021-10-05: 4 mg via INTRAVENOUS

## 2021-10-05 MED ORDER — PROPOFOL 10 MG/ML IV BOLUS
INTRAVENOUS | Status: AC
  Filled 2021-10-05: qty 20

## 2021-10-05 MED ORDER — CHLORHEXIDINE GLUCONATE 0.12 % MT SOLN
OROMUCOSAL | Status: AC
  Administered 2021-10-05: 15 mL via OROMUCOSAL
  Filled 2021-10-05: qty 15

## 2021-10-05 MED ORDER — HYDROMORPHONE HCL 1 MG/ML IJ SOLN
INTRAMUSCULAR | Status: DC | PRN
  Administered 2021-10-05: .25 mg via INTRAVENOUS

## 2021-10-05 MED ORDER — CEFAZOLIN SODIUM-DEXTROSE 2-4 GM/100ML-% IV SOLN
2.0000 g | INTRAVENOUS | Status: AC
  Administered 2021-10-05: 2 g via INTRAVENOUS

## 2021-10-05 MED ORDER — BUPIVACAINE LIPOSOME 1.3 % IJ SUSP
INTRAMUSCULAR | Status: DC | PRN
  Administered 2021-10-05: 20 mL

## 2021-10-05 MED ORDER — POVIDONE-IODINE 7.5 % EX SOLN: Freq: Once | CUTANEOUS | Status: DC

## 2021-10-05 MED ORDER — ACETAMINOPHEN 500 MG PO TABS
ORAL_TABLET | ORAL | Status: AC
  Administered 2021-10-05: 1000 mg via ORAL
  Filled 2021-10-05: qty 2

## 2021-10-05 MED ORDER — OXYCODONE HCL 5 MG/5ML PO SOLN: 5.0000 mg | Freq: Once | ORAL | Status: DC | PRN

## 2021-10-05 MED ORDER — FENTANYL CITRATE (PF) 250 MCG/5ML IJ SOLN
INTRAMUSCULAR | Status: AC
  Filled 2021-10-05: qty 5

## 2021-10-05 MED ORDER — DEXAMETHASONE SODIUM PHOSPHATE 10 MG/ML IJ SOLN
INTRAMUSCULAR | Status: DC | PRN
  Administered 2021-10-05: 4 mg via INTRAVENOUS

## 2021-10-05 MED ORDER — BUPIVACAINE LIPOSOME 1.3 % IJ SUSP
INTRAMUSCULAR | Status: AC
  Filled 2021-10-05: qty 20

## 2021-10-05 MED ORDER — CHLORHEXIDINE GLUCONATE 0.12 % MT SOLN: 15.0000 mL | Freq: Once | OROMUCOSAL | Status: AC

## 2021-10-05 MED ORDER — BUPIVACAINE-EPINEPHRINE (PF) 0.25% -1:200000 IJ SOLN
INTRAMUSCULAR | Status: AC
Start: 2021-10-05 — End: ?
  Filled 2021-10-05: qty 30

## 2021-10-05 MED ORDER — 0.9 % SODIUM CHLORIDE (POUR BTL) OPTIME
TOPICAL | Status: DC | PRN
  Administered 2021-10-05: 1000 mL

## 2021-10-05 MED ORDER — ACETAMINOPHEN 500 MG PO TABS: 1000.0000 mg | ORAL_TABLET | Freq: Once | ORAL | Status: AC

## 2021-10-05 MED ORDER — HYDROMORPHONE HCL 1 MG/ML IJ SOLN
INTRAMUSCULAR | Status: AC
  Filled 2021-10-05: qty 0.5

## 2021-10-05 MED ORDER — SUGAMMADEX SODIUM 200 MG/2ML IV SOLN
INTRAVENOUS | Status: DC | PRN
  Administered 2021-10-05: 200 mg via INTRAVENOUS

## 2021-10-05 MED ORDER — ALBUTEROL SULFATE (2.5 MG/3ML) 0.083% IN NEBU
INHALATION_SOLUTION | RESPIRATORY_TRACT | Status: AC
  Filled 2021-10-05: qty 3

## 2021-10-05 MED ORDER — OXYCODONE HCL 5 MG PO TABS: 5.0000 mg | ORAL_TABLET | Freq: Once | ORAL | Status: DC | PRN

## 2021-10-05 MED ORDER — BUPIVACAINE-EPINEPHRINE (PF) 0.25% -1:200000 IJ SOLN
INTRAMUSCULAR | Status: DC | PRN
  Administered 2021-10-05: 20 mL

## 2021-10-05 MED ORDER — PHENYLEPHRINE HCL-NACL 20-0.9 MG/250ML-% IV SOLN
INTRAVENOUS | Status: DC | PRN
  Administered 2021-10-05: 10 ug/min via INTRAVENOUS

## 2021-10-05 MED ORDER — LACTATED RINGERS IV SOLN: INTRAVENOUS | Status: DC

## 2021-10-05 MED ORDER — FENTANYL CITRATE (PF) 250 MCG/5ML IJ SOLN
INTRAMUSCULAR | Status: DC | PRN
  Administered 2021-10-05: 250 ug via INTRAVENOUS

## 2021-10-05 MED ORDER — CEFAZOLIN SODIUM-DEXTROSE 2-4 GM/100ML-% IV SOLN
INTRAVENOUS | Status: AC
  Filled 2021-10-05: qty 100

## 2021-10-05 MED ORDER — LIDOCAINE 2% (20 MG/ML) 5 ML SYRINGE
INTRAMUSCULAR | Status: DC | PRN
  Administered 2021-10-05: 40 mg via INTRAVENOUS

## 2021-10-05 SURGICAL SUPPLY — 69 items
APL SKNCLS STERI-STRIP NONHPOA (GAUZE/BANDAGES/DRESSINGS) ×1
BAG COUNTER SPONGE SURGICOUNT (BAG) ×2 IMPLANT
BAG SPNG CNTER NS LX DISP (BAG) ×1
BENZOIN TINCTURE PRP APPL 2/3 (GAUZE/BANDAGES/DRESSINGS) ×2 IMPLANT
BIT DRILL CANNULATED 7.0X3.2MM (BIT) IMPLANT
BLADE CLIPPER SURG (BLADE) ×2 IMPLANT
BLADE SURG 10 STRL SS (BLADE) ×2 IMPLANT
CANISTER SUCT 3000ML PPV (MISCELLANEOUS) ×2 IMPLANT
COVER SURGICAL LIGHT HANDLE (MISCELLANEOUS) ×2 IMPLANT
DRAPE C-ARM 42X72 X-RAY (DRAPES) ×2 IMPLANT
DRAPE C-ARMOR (DRAPES) ×2 IMPLANT
DRAPE INCISE IOBAN 66X45 STRL (DRAPES) ×2 IMPLANT
DRAPE POUCH INSTRU U-SHP 10X18 (DRAPES) ×2 IMPLANT
DRAPE SURG 17X23 STRL (DRAPES) ×6 IMPLANT
DRILL CANNULATED 7.0X3.2MM (BIT) ×2
DURAPREP 26ML APPLICATOR (WOUND CARE) ×2 IMPLANT
ELECT CAUTERY BLADE 6.4 (BLADE) ×2 IMPLANT
ELECT REM PT RETURN 9FT ADLT (ELECTROSURGICAL) ×2
ELECTRODE REM PT RTRN 9FT ADLT (ELECTROSURGICAL) ×1 IMPLANT
GAUZE 4X4 16PLY ~~LOC~~+RFID DBL (SPONGE) ×2 IMPLANT
GAUZE SPONGE 4X4 12PLY STRL (GAUZE/BANDAGES/DRESSINGS) ×2 IMPLANT
GLOVE BIO SURGEON STRL SZ7 (GLOVE) ×2 IMPLANT
GLOVE BIO SURGEON STRL SZ8 (GLOVE) ×2 IMPLANT
GLOVE BIOGEL PI IND STRL 7.0 (GLOVE) ×1 IMPLANT
GLOVE BIOGEL PI IND STRL 8 (GLOVE) ×1 IMPLANT
GLOVE BIOGEL PI INDICATOR 7.0 (GLOVE) ×1
GLOVE BIOGEL PI INDICATOR 8 (GLOVE) ×1
GLOVE SURG ENC MOIS LTX SZ6.5 (GLOVE) ×2 IMPLANT
GOWN STRL REUS W/ TWL LRG LVL3 (GOWN DISPOSABLE) ×2 IMPLANT
GOWN STRL REUS W/ TWL XL LVL3 (GOWN DISPOSABLE) ×1 IMPLANT
GOWN STRL REUS W/TWL LRG LVL3 (GOWN DISPOSABLE) ×4
GOWN STRL REUS W/TWL XL LVL3 (GOWN DISPOSABLE) ×2
IMPL IFUSE 3D 7X35 (Rod) IMPLANT
IMPL IFUSE 7.0X50 (Rod) IMPLANT
IMPLANT IFUSE 3D 7X35 (Rod) ×2 IMPLANT
IMPLANT IFUSE 7.0MMX40MM (Rod) ×2 IMPLANT
IMPLANT IFUSE 7.0X50 (Rod) ×2 IMPLANT
KIT BASIN OR (CUSTOM PROCEDURE TRAY) ×2 IMPLANT
KIT TURNOVER KIT B (KITS) ×2 IMPLANT
MANIFOLD NEPTUNE II (INSTRUMENTS) ×1 IMPLANT
NDL HYPO 25GX1X1/2 BEV (NEEDLE) ×1 IMPLANT
NEEDLE 22X1 1/2 (OR ONLY) (NEEDLE) ×2 IMPLANT
NEEDLE HYPO 25GX1X1/2 BEV (NEEDLE) ×2 IMPLANT
NS IRRIG 1000ML POUR BTL (IV SOLUTION) ×2 IMPLANT
PACK UNIVERSAL I (CUSTOM PROCEDURE TRAY) ×2 IMPLANT
PAD ARMBOARD 7.5X6 YLW CONV (MISCELLANEOUS) ×4 IMPLANT
PENCIL BUTTON HOLSTER BLD 10FT (ELECTRODE) ×2 IMPLANT
PIN PUSH 3.2MM (PIN) ×1 IMPLANT
PIN STEINMAN (PIN) ×1 IMPLANT
PIN STEINMAN 3.2MM (PIN) ×3 IMPLANT
SPONGE T-LAP 18X18 ~~LOC~~+RFID (SPONGE) ×2 IMPLANT
STAPLER VISISTAT 35W (STAPLE) ×2 IMPLANT
STRIP CLOSURE SKIN 1/2X4 (GAUZE/BANDAGES/DRESSINGS) ×2 IMPLANT
SUT MNCRL AB 4-0 PS2 18 (SUTURE) ×2 IMPLANT
SUT VIC AB 0 CT1 18XCR BRD 8 (SUTURE) IMPLANT
SUT VIC AB 0 CT1 8-18 (SUTURE)
SUT VIC AB 1 CT1 18XCR BRD 8 (SUTURE) ×1 IMPLANT
SUT VIC AB 1 CT1 8-18 (SUTURE) ×4
SUT VIC AB 2-0 CT2 18 VCP726D (SUTURE) ×2 IMPLANT
SYR BULB IRRIG 60ML STRL (SYRINGE) ×4 IMPLANT
SYR CONTROL 10ML LL (SYRINGE) ×2 IMPLANT
SYS SPNL FX3ANG 40X7X (Rod) ×1 IMPLANT
SYSTEM SPNL FX3ANG 40X7X (Rod) IMPLANT
TAPE CLOTH SURG 4X10 WHT LF (GAUZE/BANDAGES/DRESSINGS) ×1 IMPLANT
TOWEL GREEN STERILE (TOWEL DISPOSABLE) ×4 IMPLANT
TOWEL GREEN STERILE FF (TOWEL DISPOSABLE) ×2 IMPLANT
TUBE CONNECTING 12X1/4 (SUCTIONS) ×2 IMPLANT
WATER STERILE IRR 1000ML POUR (IV SOLUTION) ×2 IMPLANT
YANKAUER SUCT BULB TIP NO VENT (SUCTIONS) ×2 IMPLANT

## 2021-10-05 NOTE — Anesthesia Procedure Notes (Signed)
Procedure Name: Intubation Date/Time: 10/05/2021 8:39 AM Performed by: Wilburn Cornelia, CRNA Pre-anesthesia Checklist: Patient identified, Emergency Drugs available, Suction available, Patient being monitored and Timeout performed Patient Re-evaluated:Patient Re-evaluated prior to induction Oxygen Delivery Method: Circle system utilized Preoxygenation: Pre-oxygenation with 100% oxygen Induction Type: IV induction Ventilation: Mask ventilation without difficulty Laryngoscope Size: Mac and 3 Grade View: Grade II Tube type: Oral Tube size: 7.0 mm Number of attempts: 1 Airway Equipment and Method: Stylet Placement Confirmation: ETT inserted through vocal cords under direct vision, positive ETCO2, CO2 detector and breath sounds checked- equal and bilateral Secured at: 21 cm Tube secured with: Tape Dental Injury: Teeth and Oropharynx as per pre-operative assessment

## 2021-10-05 NOTE — H&P (Signed)
PREOPERATIVE H&P  Chief Complaint: Right-sided low back pain  HPI: [name] is a 61 y.o. female who presents with ongoing pain in the low back on the right  Patient's exam and history c/w right SI joint dysfunction  Patient has failed multiple forms of conservative care and continues to have pain (see office notes for additional details regarding the patient's full course of treatment)  Past Medical History:  Diagnosis Date   Allergy    Anxiety    Arthritis    Back pain, chronic    Bipolar disorder ([place])    denies   Bronchitis    hx-no inhalers at home   Carotid bruit    Carpal tunnel syndrome on both sides    Cataract    Coronary artery disease    DDD (degenerative disc disease), lumbar    Depression    Diabetes mellitus    type II   Ejection fraction    .   GERD (gastroesophageal reflux disease)    Hx of CABG    Hypercholesteremia    Hypertension    Hypothyroidism    Multiple thyroid nodules    Myocardial infarction ([place]) 11/20/2006   "4 in one section"   Neuromuscular disorder ([place])    Nodule of neck    Pneumonia    PTSD (post-traumatic stress disorder)    Sleep apnea    cpap    Past Surgical History:  Procedure Laterality Date   ABDOMINAL HYSTERECTOMY  1984   BACK SURGERY     fusion T4- T-5   BREAST EXCISIONAL BIOPSY Right    benign   BREAST EXCISIONAL BIOPSY Left    benign   COLONOSCOPY     CORONARY ARTERY BYPASS GRAFT  2008   KNEE SURGERY Left 2009   Menisus tear   ORIF ANKLE FRACTURE Right 07/12/2012   Procedure: OPEN REDUCTION INTERNAL FIXATION (ORIF) RIGHT ANKLE FRACTURE;  Surgeon: Alta Corning, MD;  Location: [facility];  Service: Orthopedics;  Laterality: Right;   SACROILIAC JOINT FUSION Left 11/19/2019   Procedure: LEFT SACROILIAC JOINT FUSION;  Surgeon: Phylliss Bob, MD;  Location: [facility];  Service: Orthopedics;  Laterality: Left;  posterior   SHOULDER HEMI-ARTHROPLASTY Right 04/15/2020   Procedure:  SHOULDER HEMI-ARTHROPLASTY;  Surgeon: Tania Ade, MD;  Location: [facility];  Service: Orthopedics;  Laterality: Right;  block   SHOULDER SURGERY Right 11/08/2016   rotator cuff   TUBAL LIGATION     WRIST SURGERY Left    Social History   Socioeconomic History   Marital status: Legally Separated    Spouse name: Not on file   Number of children: 2   Years of education: Not on file   Highest education level: 11th grade  Occupational History   Not on file  Tobacco Use   Smoking status: Every Day    Packs/day: 0.50    Years: 38.00    Pack years: 19.00    Types: Cigarettes   Smokeless tobacco: Never  Vaping Use   Vaping Use: Never used  Substance and Sexual Activity   Alcohol use: No   Drug use: No   Sexual activity: Not on file  Other Topics Concern   Not on file  Social History Narrative   Not on file   Social Determinants of Health   Financial Resource Strain: Not on file  Food Insecurity: Not on file  Transportation Needs: Not on file  Physical Activity: Not on file  Stress:  Not on file  Social Connections: Not on file   Family History  Problem Relation Age of Onset   Heart disease Mother    Hyperlipidemia Mother    Hypertension Mother    Heart disease Father    Stroke Father    Hypertension Father    Hyperlipidemia Father    Hypertension Sister    Hypertension Brother    Cancer Daughter        58 different kinds of cancer   Hypertension Maternal Grandmother    Hypertension Maternal Grandfather    Cancer Maternal Grandfather    Hypertension Paternal Grandmother    Hypertension Paternal Grandfather    Cancer Maternal Aunt        breast and ovarian   Cancer Paternal Aunt        breast   Colon cancer Neg Hx    Esophageal cancer Neg Hx    Rectal cancer Neg Hx    Stomach cancer Neg Hx    Liver cancer Neg Hx    Allergies  Allergen Reactions   Latuda [Lurasidone Hcl] Shortness Of Breath   Coconut Flavor Swelling and Rash   Codeine Nausea And Vomiting     "I get deathly sick , vomitting"   Ibuprofen Swelling and Rash   Prednisone Swelling and Rash   Cyclobenzaprine     "almost made heart stop, couldn't breathe"   Meloxicam Itching   Morphine Nausea And Vomiting   Prior to Admission medications   Medication Sig Start Date End Date Taking? Authorizing Provider  aspirin EC 81 MG tablet Take 81 mg by mouth 2 (two) times daily. Swallow whole.   Yes [provider]  atenolol (TENORMIN) 25 MG tablet Take 25 mg by mouth daily.   Yes [provider]  carvedilol (COREG) 3.125 MG tablet TAKE 1 TABLET TWICE DAILY 04/28/19  Yes Jacelyn Pi, Lilia Argue, MD  diazepam (VALIUM) 5 MG tablet Take 5 mg by mouth at bedtime as needed (sleep).   Yes [provider]  Dulaglutide (TRULICITY) 1.5 JS/2.8BT SOPN Inject 1.5 mg into the skin every Saturday.   Yes [provider]  DULoxetine (CYMBALTA) 60 MG capsule Take 60 mg by mouth 2 (two) times daily.   Yes [provider]  Empagliflozin-metFORMIN HCl (SYNJARDY) 12.08-998 MG TABS Take 1 tablet by mouth daily.   Yes [provider]  ezetimibe (ZETIA) 10 MG tablet Take 1 tablet (10 mg total) by mouth daily. Please make overdue appt with Dr. Acie Fredrickson before anymore refills. 1st attempt 07/22/19  Yes Nahser, Wonda Cheng, MD  famotidine (PEPCID) 20 MG tablet Take 1 tablet (20 mg total) by mouth 2 (two) times daily. 11/24/18  Yes Jacelyn Pi, Lilia Argue, MD  fenofibrate (TRICOR) 145 MG tablet Take 1 tablet (145 mg total) by mouth daily. Please make overdue appt with Dr. Acie Fredrickson before anymore refills. 1st attempt Patient taking differently: Take 145 mg by mouth daily. 12/05/19  Yes Nahser, Wonda Cheng, MD  fluticasone (FLONASE) 50 MCG/ACT nasal spray Place 2 sprays into both nostrils at bedtime.   Yes [provider]  levothyroxine (SYNTHROID) 100 MCG tablet Take 100 mcg by mouth daily before breakfast.   Yes [provider]  Magnesium 250 MG TABS Take 500 mg by mouth  at bedtime.   Yes [provider]  nitroGLYCERIN (NITROSTAT) 0.4 MG SL tablet Place 0.4 mg under the tongue every 5 (five) minutes as needed for chest pain.   Yes [provider]  Omega-3 Fatty  Acids (FISH OIL PO) Take 2,400 mg by mouth at bedtime.   Yes [provider]  omeprazole (PRILOSEC) 40 MG capsule TAKE 1 CAPSULE DAILY 30 MINUTES BEFORE A MEAL Patient taking differently: Take 40 mg by mouth daily as needed (for acid reflux take 30 minutes before a meal). 09/16/18  Yes Jacelyn Pi, Lilia Argue, MD  Oxycodone HCl 10 MG TABS Take 10 mg by mouth every 4 (four) hours as needed (pain). 07/04/18  Yes [provider]  pregabalin (LYRICA) 100 MG capsule Take 100 mg by mouth 3 (three) times daily.   Yes [provider]  PROCTO-MED HC 2.5 % rectal cream Place 1 application. rectally daily as needed for pain. 09/12/21  Yes [provider]  rosuvastatin (CRESTOR) 40 MG tablet Take 40 mg by mouth daily.   Yes [provider]  senna-docusate (SENOKOT-S) 8.6-50 MG tablet Take 1 tablet by mouth at bedtime.   Yes [provider]  tiZANidine (ZANAFLEX) 4 MG tablet Take 4 mg by mouth 3 (three) times daily as needed for muscle spasms.   Yes [provider]  Vitamin D, Ergocalciferol, (DRISDOL) 1.25 MG (50000 UNIT) CAPS capsule Take 50,000 Units by mouth every 7 (seven) days. Saturdays   Yes [provider]  Blood Glucose Calibration (ACCU-CHEK GUIDE CONTROL) LIQD 1 each by Does not apply route as needed. Use to calibrate glucometer prn; E11.9 07/19/18   Renato Shin, MD  diphenhydrAMINE (BENADRYL) 25 MG tablet Take 1 tablet (25 mg total) by mouth every 6 (six) hours as needed. Patient not taking: Reported on 09/21/2021 09/11/21   Mesner, Corene Cornea, MD  glucose blood (ACCU-CHEK GUIDE) test strip Use to check blood sugar 3 times daily. 07/22/18   Renato Shin, MD  hydrocortisone (ANUSOL-HC) 25 MG suppository Place 1 suppository (25 mg  total) rectally 2 (two) times daily. Patient not taking: Reported on 09/21/2021 09/11/21   Mesner, Corene Cornea, MD  Lancets Misc. (ACCU-CHEK FASTCLIX LANCET) KIT 1 each by Does not apply route 3 (three) times daily. Use to monitor glucose levels TID; E11.9 07/19/18   Renato Shin, MD  lidocaine (XYLOCAINE) 2 % solution Use as directed 15 mLs in the mouth or throat as needed for mouth pain. Patient not taking: Reported on 09/21/2021 09/11/21   Mesner, Corene Cornea, MD  naloxone [facility]) nasal spray 4 mg/0.1 mL Place 1 spray into the nose as needed (opioid overdose).    [provider]     All other systems have been reviewed and were otherwise negative with the exception of those mentioned in the HPI and as above.  Physical Exam: Vitals:   10/05/21 0651  BP: 125/65  Pulse: (!) 54  Resp: 18  Temp: 98.3 F (36.8 C)  SpO2: 93%    Body mass index is 27.11 kg/m.  General: Alert, no acute distress Cardiovascular: No pedal edema Respiratory: No cyanosis, no use of accessory musculature Skin: No lesions in the area of chief complaint Neurologic: Sensation intact distally Psychiatric: Patient is competent for consent with normal mood and affect Lymphatic: No axillary or cervical lymphadenopathy   Assessment/Plan: Ongoing chronic right sacroiliac joint dysfunction Plan for Procedure(s): RIGHT-SIDED SACROILIAC JOINT FUSION   Norva Karvonen, MD 10/05/2021 7:35 AM

## 2021-10-05 NOTE — Anesthesia Postprocedure Evaluation (Signed)
Anesthesia Post Note  Patient: [name]  Procedure(s) Performed: RIGHT-SIDED SACROILIAC JOINT FUSION (Right)     Patient location during evaluation: PACU Anesthesia Type: General Level of consciousness: awake and alert, patient cooperative and oriented Pain management: pain level controlled Vital Signs Assessment: post-procedure vital signs reviewed and stable Respiratory status: spontaneous breathing, nonlabored ventilation and respiratory function stable Cardiovascular status: blood pressure returned to baseline and stable Postop Assessment: no apparent nausea or vomiting Anesthetic complications: no   No notable events documented.  Last Vitals:  Vitals:   10/05/21 1035 10/05/21 1050  BP: (!) 104/51 (!) 114/54  Pulse: (!) 59 (!) 56  Resp: 13 13  Temp:    SpO2: 94% 100%    Last Pain:  Vitals:   10/05/21 1050  TempSrc:   PainSc: 0-No pain                 Yoan Sallade,E. Tramain Gershman

## 2021-10-05 NOTE — Op Note (Signed)
PATIENT NAME: [name] RECORD NO.:   [id number]    DATE OF BIRTH: Apr 19, 1961    DATE OF PROCEDURE: 10/05/2021                                OPERATIVE REPORT     PREOPERATIVE DIAGNOSES:   Right sacroiliac joint dysfunction   POSTOPERATIVE DIAGNOSES: Right sacroiliac joint dysfunction   PROCEDURE: Minimally invasive right sacroiliac joint fusion   SURGEON:  Phylliss Bob, MD.   ASSISTANTPricilla Holm, PA-C.   ANESTHESIA:  General endotracheal anesthesia.   COMPLICATIONS:  None.   DISPOSITION:  Stable.   ESTIMATED BLOOD LOSS:  Minimal.   INDICATIONS FOR SURGERY:  Briefly, [name] is a pleasant 61 year old female, who did present to me with ongoing severe pain at the right side of her low back.  Her work-up was diagnostic for right sacroiliac joint dysfunction.  She did fail appropriate conservative treatment measures, and as such, we did discuss proceeding with the surgery noted above.  The patient was made fully aware of the risks and recovery period associated with surgery, and she did wish to proceed.   OPERATIVE DETAILS:  On 10/05/2021, the patient was brought to surgery and general endotracheal anesthesia was administered.  The patient was placed prone on a flat Jackson bed, withdrawal was placed beneath the patient's chest and hips.  The region of the right buttock was prepped and draped in the usual sterile fashion.  A timeout procedure was performed.  Fluoroscopy was brought into the field.  I was able to ensure adequate lateral, inlet, and outlet radiographs.  At this point, a 3 cm incision was made in line with the posterior border of the sacrum on the right.  3 guidewires were advanced across the sacroiliac joint on the right side.  Fluoroscopy was liberally used while advancing the guidewires in order to ensure a safe trajectory of the guidewires.  I then drilled and broached over the guidewires.  At this point, 7 mm implants were advanced across  the sacroiliac joints from superiorly to inferiorly, the length of the implants were 50 mm, 40 mm, and 35 mm.  I was very pleased with the resting position of the implants on lateral, inlet, and outlet fluoroscopy.  The guidewires were then removed.  I was very pleased with the final lateral, inlet, and outlet radiographs.  At this point, the wound was copiously irrigated and closed in layers, using #1 Vicryl, followed by 2-0 Vicryl, followed by 4-0 Monocryl.  All instrument counts were correct at the termination of the procedure.     Of note, Pricilla Holm was my assistant throughout surgery, and did aid in retraction, suctioning, and closure from start to finish.     Phylliss Bob, MD

## 2021-10-05 NOTE — Transfer of Care (Signed)
Immediate Anesthesia Transfer of Care Note  Patient: [name]  Procedure(s) Performed: RIGHT-SIDED SACROILIAC JOINT FUSION (Right)  Patient Location: PACU  Anesthesia Type:General  Level of Consciousness: awake and alert   Airway & Oxygen Therapy: Patient Spontanous Breathing and Patient connected to nasal cannula oxygen  Post-op Assessment: Report given to RN and Post -op Vital signs reviewed and stable  Post vital signs: Reviewed and stable  Last Vitals:  Vitals Value Taken Time  BP 108/61   Temp    Pulse 64 10/05/21 1003  Resp 18 10/05/21 1003  SpO2 95 % 10/05/21 1003  Vitals shown include unvalidated device data.  Last Pain:  Vitals:   10/05/21 0720  TempSrc:   PainSc: 7       Patients Stated Pain Goal: 3 (94/07/68 0881)  Complications: No notable events documented.

## 2021-10-06 ENCOUNTER — Encounter (HOSPITAL_COMMUNITY): Payer: Self-pay | Admitting: Orthopedic Surgery

## 2021-11-11 IMAGING — DX DG SHOULDER 2+V PORT*R*
1 series · 1 of 1 positions shown · non-contrast
Comparison: 04/12/2020

CLINICAL DATA: Status post right shoulder arthroplasty.

EXAM:
PORTABLE RIGHT SHOULDER

[shoulder ap]
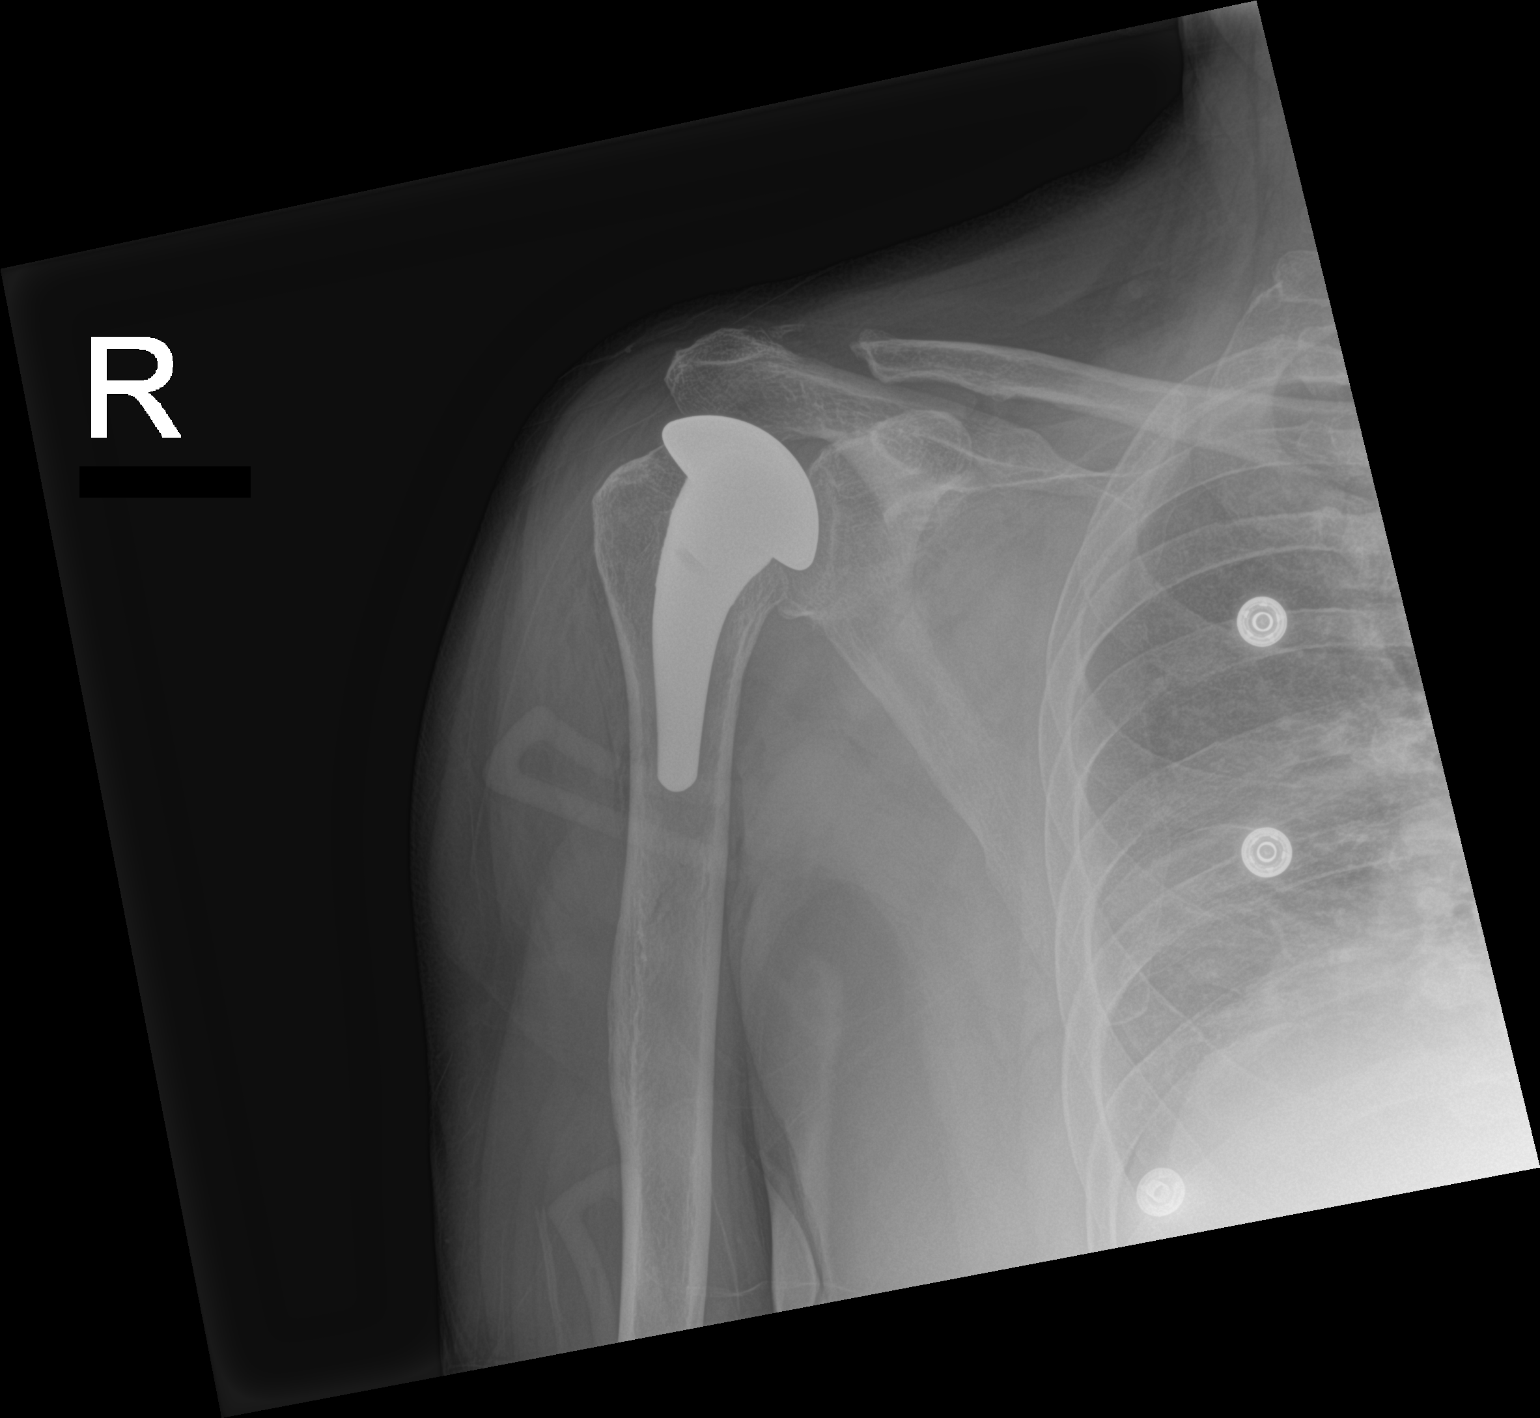

[1 of 1 positions shown; findings below may reference images not displayed]

FINDINGS: Interval right hemiarthroplasty of the right shoulder. Hardware
components are in anatomic alignment. No signs of periprosthetic
fracture or subluxation. Stable postsurgical change from resection
of the distal clavicle.
IMPRESSION: Status post right hemiarthroplasty.

## 2021-11-24 ENCOUNTER — Encounter: Payer: Self-pay | Admitting: Gastroenterology

## 2022-05-31 ENCOUNTER — Other Ambulatory Visit: Payer: Self-pay

## 2022-05-31 ENCOUNTER — Encounter: Payer: Self-pay | Admitting: Emergency Medicine

## 2022-05-31 ENCOUNTER — Emergency Department
Admission: EM | Admit: 2022-05-31 | Discharge: 2022-06-01 | Disposition: A | Discharge status: Home / Self Care | Payer: Medicare HMO | Attending: Emergency Medicine | Admitting: Emergency Medicine

## 2022-05-31 ENCOUNTER — Emergency Department: Payer: Medicare HMO

## 2022-05-31 DIAGNOSIS — Z96611 Presence of right artificial shoulder joint: Secondary | ICD-10-CM | POA: Diagnosis not present

## 2022-05-31 DIAGNOSIS — R0789 Other chest pain: Secondary | ICD-10-CM | POA: Insufficient documentation

## 2022-05-31 DIAGNOSIS — Z955 Presence of coronary angioplasty implant and graft: Secondary | ICD-10-CM | POA: Diagnosis not present

## 2022-05-31 DIAGNOSIS — E039 Hypothyroidism, unspecified: Secondary | ICD-10-CM | POA: Diagnosis not present

## 2022-05-31 DIAGNOSIS — R1084 Generalized abdominal pain: Secondary | ICD-10-CM | POA: Diagnosis not present

## 2022-05-31 DIAGNOSIS — Z7984 Long term (current) use of oral hypoglycemic drugs: Secondary | ICD-10-CM | POA: Diagnosis not present

## 2022-05-31 DIAGNOSIS — I2510 Atherosclerotic heart disease of native coronary artery without angina pectoris: Secondary | ICD-10-CM | POA: Diagnosis not present

## 2022-05-31 DIAGNOSIS — E119 Type 2 diabetes mellitus without complications: Secondary | ICD-10-CM | POA: Diagnosis not present

## 2022-05-31 DIAGNOSIS — I10 Essential (primary) hypertension: Secondary | ICD-10-CM | POA: Insufficient documentation

## 2022-05-31 DIAGNOSIS — K5900 Constipation, unspecified: Secondary | ICD-10-CM | POA: Diagnosis not present

## 2022-05-31 DIAGNOSIS — Z79899 Other long term (current) drug therapy: Secondary | ICD-10-CM | POA: Diagnosis not present

## 2022-05-31 DIAGNOSIS — R079 Chest pain, unspecified: Secondary | ICD-10-CM

## 2022-05-31 LAB — HEPATIC FUNCTION PANEL
ALT: 7 U/L (ref 0–44)
AST: 18 U/L (ref 15–41)
Albumin: 3.5 g/dL (ref 3.5–5.0)
Alkaline Phosphatase: 73 U/L (ref 38–126)
Bilirubin, Direct: 0.1 mg/dL (ref 0.0–0.2)
Indirect Bilirubin: 0.5 mg/dL (ref 0.3–0.9)
Total Bilirubin: 0.6 mg/dL (ref 0.3–1.2)
Total Protein: 6.6 g/dL (ref 6.5–8.1)

## 2022-05-31 LAB — CBC
HCT: 44 % (ref 36.0–46.0)
Hemoglobin: 14.6 g/dL (ref 12.0–15.0)
MCH: 28.3 pg (ref 26.0–34.0)
MCHC: 33.2 g/dL (ref 30.0–36.0)
MCV: 85.3 fL (ref 80.0–100.0)
Platelets: 290 10*3/uL (ref 150–400)
RBC: 5.16 MIL/uL — ABNORMAL HIGH (ref 3.87–5.11)
RDW: 13.9 % (ref 11.5–15.5)
WBC: 9.8 10*3/uL (ref 4.0–10.5)
nRBC: 0 % (ref 0.0–0.2)

## 2022-05-31 LAB — BASIC METABOLIC PANEL
Anion gap: 9 (ref 5–15)
BUN: 11 mg/dL (ref 6–20)
CO2: 24 mmol/L (ref 22–32)
Calcium: 9 mg/dL (ref 8.9–10.3)
Chloride: 104 mmol/L (ref 98–111)
Creatinine, Ser: 0.7 mg/dL (ref 0.44–1.00)
GFR, Estimated: >60 mL/min (ref >60)
Glucose, Bld: 198 mg/dL — ABNORMAL HIGH (ref 70–99)
Potassium: 3.8 mmol/L (ref 3.5–5.1)
Sodium: 137 mmol/L (ref 135–145)

## 2022-05-31 LAB — TROPONIN I (HIGH SENSITIVITY)
Troponin I (High Sensitivity): 5 ng/L (ref <18)
Troponin I (High Sensitivity): 5 ng/L (ref <18)

## 2022-05-31 LAB — POC URINE PREG, ED
Preg Test, Ur: NEGATIVE
Preg Test, Ur: NEGATIVE

## 2022-05-31 LAB — LIPASE, BLOOD: Lipase: 49 U/L (ref 11–51)

## 2022-05-31 MED ORDER — SODIUM CHLORIDE 0.9 % IV BOLUS
500.0000 mL | Freq: Once | INTRAVENOUS | Status: AC
Start: 2022-06-01 — End: 2022-06-01
  Administered 2022-06-01: 500 mL via INTRAVENOUS

## 2022-05-31 MED ORDER — HYDROMORPHONE HCL 1 MG/ML IJ SOLN
0.5000 mg | Freq: Once | INTRAMUSCULAR | Status: AC
  Administered 2022-06-01: 0.5 mg via INTRAVENOUS
  Filled 2022-05-31: qty 0.5

## 2022-05-31 MED ORDER — ONDANSETRON HCL 4 MG/2ML IJ SOLN
4.0000 mg | Freq: Once | INTRAMUSCULAR | Status: AC
Start: 2022-06-01 — End: 2022-06-01
  Administered 2022-06-01: 4 mg via INTRAVENOUS
  Filled 2022-05-31: qty 2

## 2022-05-31 NOTE — ED Provider Notes (Signed)
[facility] Provider Note    Event Date/Time   First MD Initiated Contact with Patient 05/31/22 2340     (approximate)   History   Chest Pain   HPI  [name] is a 62 y.o. female who presents to the ED from home with a chief complaint of left-sided chest pain, indigestion and abdominal pain, greater in the right flank.  Patient status post laparoscopic cholecystectomy on 05/22/2022.  Reports above symptoms x 2 days.  Denies fever/chills, cough, shortness of breath, nausea, vomiting or diarrhea.  Having small bowel movements.  States she feels like she would feel better if she could let out a bid fart.     Past Medical History   Past Medical History:  Diagnosis Date   Allergy    Anxiety    Arthritis    Back pain, chronic    Bipolar disorder (HCC)    denies   Bronchitis    hx-no inhalers at home   Carotid bruit    Carpal tunnel syndrome on both sides    Cataract    Coronary artery disease    DDD (degenerative disc disease), lumbar    Depression    Diabetes mellitus    type II   Ejection fraction    .   GERD (gastroesophageal reflux disease)    Hx of CABG    Hypercholesteremia    Hypertension    Hypothyroidism    Multiple thyroid nodules    Myocardial infarction ([place]) 11/20/2006   "4 in one section"   Neuromuscular disorder ([place])    Nodule of neck    Pneumonia    PTSD (post-traumatic stress disorder)    Sleep apnea    cpap      Active Problem List   Patient Active Problem List   Diagnosis Date Noted   S/P shoulder replacement, right 04/15/2020   S/P hysterectomy 06/24/2018   History of colonic polyps 06/24/2018   Polycythemia, secondary 05/13/2017   Diabetes ([place]) 07/29/2015   History of myocardial infarction 12/09/2014   Acquired hypothyroidism 10/09/2014   Benign essential HTN 10/09/2014   Bipolar affective disorder, depressed, moderate ([place]) 10/09/2014   Chronic pain syndrome 10/09/2014   Coccyx pain  10/09/2014   Recurrent falls 10/09/2014   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/03/2014   Multinodular goiter 09/15/2013   Carotid bruit    Depression    Coronary artery disease    Hyperlipidemia    Neuromuscular disorder (HCC)    Arthritis    DDD (degenerative disc disease), lumbar    Hx of CABG    Spinal stenosis of lumbar region 02/08/2012   Neuropathy 02/08/2012   Back pain, chronic    TOBACCO ABUSE 11/20/2008     Past Surgical History   Past Surgical History:  Procedure Laterality Date   ABDOMINAL HYSTERECTOMY  1984   BACK SURGERY     fusion T4- T-5   BREAST EXCISIONAL BIOPSY Right    benign   BREAST EXCISIONAL BIOPSY Left    benign   COLONOSCOPY     CORONARY ARTERY BYPASS GRAFT  2008   KNEE SURGERY Left 2009   Menisus tear   ORIF ANKLE FRACTURE Right 07/12/2012   Procedure: OPEN REDUCTION INTERNAL FIXATION (ORIF) RIGHT ANKLE FRACTURE;  Surgeon: Alta Corning, MD;  Location: [place];  Service: Orthopedics;  Laterality: Right;   SACROILIAC JOINT FUSION Left 11/19/2019   Procedure: LEFT SACROILIAC JOINT FUSION;  Surgeon: Phylliss Bob, MD;  Location: [place];  Service: Orthopedics;  Laterality: Left;  posterior   SACROILIAC JOINT FUSION Right 10/05/2021   Procedure: RIGHT-SIDED SACROILIAC JOINT FUSION;  Surgeon: Phylliss Bob, MD;  Location: [place];  Service: Orthopedics;  Laterality: Right;   SHOULDER HEMI-ARTHROPLASTY Right 04/15/2020   Procedure: SHOULDER HEMI-ARTHROPLASTY;  Surgeon: Tania Ade, MD;  Location: [facility];  Service: Orthopedics;  Laterality: Right;  block   SHOULDER SURGERY Right 11/08/2016   rotator cuff   TUBAL LIGATION     WRIST SURGERY Left      Home Medications   Prior to Admission medications   Medication Sig Start Date End Date Taking? Authorizing Provider  dicyclomine (BENTYL) 20 MG tablet Take 1 tablet (20 mg total) by mouth every 6 (six) hours. 06/01/22  Yes Paulette Blanch, MD  lactulose (CHRONULAC) 10 GM/15ML  solution Take 30 mLs (20 g total) by mouth daily as needed for mild constipation. 06/01/22  Yes Paulette Blanch, MD  ondansetron (ZOFRAN-ODT) 4 MG disintegrating tablet Take 1 tablet (4 mg total) by mouth every 8 (eight) hours as needed for nausea or vomiting. 06/01/22  Yes Paulette Blanch, MD  atenolol (TENORMIN) 25 MG tablet Take 25 mg by mouth daily.    [provider]  Blood Glucose Calibration (ACCU-CHEK GUIDE CONTROL) LIQD 1 each by Does not apply route as needed. Use to calibrate glucometer prn; E11.9 07/19/18   Renato Shin, MD  carvedilol (COREG) 3.125 MG tablet TAKE 1 TABLET TWICE DAILY 04/28/19   Jacelyn Pi, Lilia Argue, MD  diazepam (VALIUM) 5 MG tablet Take 5 mg by mouth at bedtime as needed (sleep).    [provider]  diphenhydrAMINE (BENADRYL) 25 MG tablet Take 1 tablet (25 mg total) by mouth every 6 (six) hours as needed. Patient not taking: Reported on 09/21/2021 09/11/21   Mesner, Corene Cornea, MD  Dulaglutide (TRULICITY) 1.5 BO/1.7PZ SOPN Inject 1.5 mg into the skin every Saturday.    [provider]  DULoxetine (CYMBALTA) 60 MG capsule Take 60 mg by mouth 2 (two) times daily.    [provider]  Empagliflozin-metFORMIN HCl (SYNJARDY) 12.08-998 MG TABS Take 1 tablet by mouth daily.    [provider]  ezetimibe (ZETIA) 10 MG tablet Take 1 tablet (10 mg total) by mouth daily. Please make overdue appt with Dr. Acie Fredrickson before anymore refills. 1st attempt 07/22/19   Nahser, Wonda Cheng, MD  famotidine (PEPCID) 20 MG tablet Take 1 tablet (20 mg total) by mouth 2 (two) times daily. 11/24/18   Jacelyn Pi, Lilia Argue, MD  fenofibrate (TRICOR) 145 MG tablet Take 1 tablet (145 mg total) by mouth daily. Please make overdue appt with Dr. Acie Fredrickson before anymore refills. 1st attempt Patient taking differently: Take 145 mg by mouth daily. 12/05/19   Nahser, Wonda Cheng, MD  fluticasone (FLONASE) 50 MCG/ACT nasal spray Place 2 sprays into both nostrils at bedtime.    [provider]  glucose blood (ACCU-CHEK GUIDE) test strip Use to check blood sugar 3 times daily. 07/22/18   Renato Shin, MD  HYDROcodone-acetaminophen (NORCO/VICODIN) 5-325 MG tablet Take 1 tablet by mouth every 4 (four) hours as needed for moderate pain or severe pain (for breakthrough pain on top of baseline meds PRN x10 days). 10/05/21 10/05/22  McKenzie, Lennie Muckle, PA-C  hydrocortisone (ANUSOL-HC) 25 MG suppository Place 1 suppository (25 mg total) rectally 2 (two) times daily. Patient not taking: Reported on 09/21/2021 09/11/21   Mesner, Corene Cornea, MD  Lancets Misc. ([place])  KIT 1 each by Does not apply route 3 (three) times daily. Use to monitor glucose levels TID; E11.9 07/19/18   Renato Shin, MD  levothyroxine (SYNTHROID) 100 MCG tablet Take 100 mcg by mouth daily before breakfast.    [provider]  lidocaine (XYLOCAINE) 2 % solution Use as directed 15 mLs in the mouth or throat as needed for mouth pain. Patient not taking: Reported on 09/21/2021 09/11/21   Mesner, Corene Cornea, MD  Magnesium 250 MG TABS Take 500 mg by mouth at bedtime.    [provider]  naloxone [facility]) nasal spray 4 mg/0.1 mL Place 1 spray into the nose as needed (opioid overdose).    [provider]  nitroGLYCERIN (NITROSTAT) 0.4 MG SL tablet Place 0.4 mg under the tongue every 5 (five) minutes as needed for chest pain.    [provider]  Omega-3 Fatty Acids (FISH OIL PO) Take 2,400 mg by mouth at bedtime.    [provider]  omeprazole (PRILOSEC) 40 MG capsule TAKE 1 CAPSULE DAILY 30 MINUTES BEFORE A MEAL Patient taking differently: Take 40 mg by mouth daily as needed (for acid reflux take 30 minutes before a meal). 09/16/18   Daleen Squibb, MD  Oxycodone HCl 10 MG TABS Take 10 mg by mouth every 4 (four) hours as needed (pain). 07/04/18   [provider]  pregabalin (LYRICA) 100 MG capsule Take 100 mg by mouth 3 (three) times daily.    [provider]  PROCTO-MED HC 2.5 % rectal cream Place 1 application. rectally daily as needed for pain. 09/12/21   [provider]  rosuvastatin (CRESTOR) 40 MG tablet Take 40 mg by mouth daily.    [provider]  senna-docusate (SENOKOT-S) 8.6-50 MG tablet Take 1 tablet by mouth at bedtime.    [provider]  tiZANidine (ZANAFLEX) 4 MG tablet Take 4 mg by mouth 3 (three) times daily as needed for muscle spasms.    [provider]  Vitamin D, Ergocalciferol, (DRISDOL) 1.25 MG (50000 UNIT) CAPS capsule Take 50,000 Units by mouth every 7 (seven) days. Saturdays    [provider]     Allergies  Anette Guarneri [lurasidone hcl], Coconut flavor, Codeine, Ibuprofen, Prednisone, Cyclobenzaprine, Meloxicam, and Morphine   Family History   Family History  Problem Relation Age of Onset   Heart disease Mother    Hyperlipidemia Mother    Hypertension Mother    Heart disease Father    Stroke Father    Hypertension Father    Hyperlipidemia Father    Hypertension Sister    Hypertension Brother    Cancer Daughter        47 different kinds of cancer   Hypertension Maternal Grandmother    Hypertension Maternal Grandfather    Cancer Maternal Grandfather    Hypertension Paternal Grandmother    Hypertension Paternal Grandfather    Cancer Maternal Aunt        breast and ovarian   Cancer Paternal Aunt        breast   Colon cancer Neg Hx    Esophageal cancer Neg Hx    Rectal cancer Neg Hx    Stomach cancer Neg Hx    Liver cancer Neg Hx      Physical Exam  Triage Vital Signs: ED Triage Vitals  Enc Vitals Group     BP 05/31/22 1905 (!) 110/92     Pulse Rate 05/31/22 1905 75     Resp 05/31/22 1905 18  Temp 05/31/22 1905 99.5 F (37.5 C)     Temp Source 05/31/22 1905 Oral     SpO2 05/31/22 1905 100 %     Weight 05/31/22 1904 144 lb (65.3 kg)     Height 05/31/22 1904 '5\' 1"'$  (1.549 m)     Head Circumference --      Peak Flow --      Pain Score  05/31/22 1904 10     Pain Loc --      Pain Edu? --      Excl. in [place]? --     Updated Vital Signs: BP (!) 137/40   Pulse (!) 56   Temp 98.5 F (36.9 C)   Resp 18   Ht '5\' 1"'$  (1.549 m)   Wt 65.3 kg   SpO2 96%   BMI 27.21 kg/m    General: Awake, mild distress.  CV:  RRR.  Good peripheral perfusion.  Resp:  Normal effort.  CTAB. Abd:  Mild diffuse tenderness to palpation without rebound or guarding.  Surgical sites C/D/I.  No distention.  Other:  No truncal vesicles.  Bilateral calves are supple and nontender.   ED Results / Procedures / Treatments  Labs (all labs ordered are listed, but only abnormal results are displayed) Labs Reviewed  BASIC METABOLIC PANEL - Abnormal; Notable for the following components:      Result Value   Glucose, Bld 198 (*)    All other components within normal limits  CBC - Abnormal; Notable for the following components:   RBC 5.16 (*)    All other components within normal limits  URINALYSIS, ROUTINE W REFLEX MICROSCOPIC - Abnormal; Notable for the following components:   Color, Urine STRAW (*)    APPearance CLEAR (*)    Specific Gravity, Urine >1.046 (*)    Glucose, UA >=500 (*)    All other components within normal limits  HEPATIC FUNCTION PANEL  LIPASE, BLOOD  POC URINE PREG, ED  POC URINE PREG, ED  TROPONIN I (HIGH SENSITIVITY)  TROPONIN I (HIGH SENSITIVITY)     EKG  ED ECG REPORT I, Janal Haak J, the attending physician, personally viewed and interpreted this ECG.   Date: 06/01/2022  EKG Time: 1908  Rate: 65  Rhythm: normal sinus rhythm  Axis: Normal  Intervals:none  ST&T Change: Nonspecific    RADIOLOGY I have independently visualized and interpreted patient's chest x-ray as well as noted the radiology interpretation:  Chest x-ray: No acute cardiopulmonary process  CTA Chest: No PE  CT Abd/Pel: No acute changes; large stool burden  Official radiology report(s): CT Angio Chest PE W/Cm &/Or Wo Cm  Result Date:  06/01/2022 CLINICAL DATA:  Left chest pain x2 days, status post gallbladder surgery on 1/22, abdominal pain EXAM: CT ANGIOGRAPHY CHEST CT ABDOMEN AND PELVIS WITH CONTRAST TECHNIQUE: Multidetector CT imaging of the chest was performed using the standard protocol during bolus administration of intravenous contrast. Multiplanar CT image reconstructions and MIPs were obtained to evaluate the vascular anatomy. Multidetector CT imaging of the abdomen and pelvis was performed using the standard protocol during bolus administration of intravenous contrast. RADIATION DOSE REDUCTION: This exam was performed according to the departmental dose-optimization program which includes automated exposure control, adjustment of the mA and/or kV according to patient size and/or use of iterative reconstruction technique. CONTRAST:  141m OMNIPAQUE IOHEXOL 350 MG/ML SOLN COMPARISON:  CT chest dated 04/30/2019. FINDINGS: CTA CHEST FINDINGS Cardiovascular: Satisfactory opacification of the bilateral pulmonary arteries to the segmental level. No evidence  of pulmonary embolism. Although not tailored for evaluation of the thoracic aorta, there is no evidence of thoracic aortic aneurysm or dissection. Atherosclerotic calcifications of the aortic arch. Mild coronary atherosclerosis of the LAD and right coronary artery. Postsurgical changes related to prior CABG. The heart is normal in size.  No pericardial effusion. Mediastinum/Nodes: No suspicious mediastinal lymphadenopathy. Visualized thyroid is unremarkable. Lungs/Pleura: Mild paraseptal emphysematous changes, upper lung predominant. Dependent atelectasis in the bilateral upper and lower lobes. No focal consolidation. No suspicious pulmonary nodules. No pleural effusion or pneumothorax. Musculoskeletal: Mild degenerative changes of the mid/lower thoracic spine. Median sternotomy. Review of the MIP images confirms the above findings. CT ABDOMEN and PELVIS FINDINGS Hepatobiliary: Liver is  within normal limits. Status post cholecystectomy. No intrahepatic or extrahepatic ductal dilatation. Pancreas: Within normal limits. Spleen: Within normal limits. Adrenals/Urinary Tract: Adrenal glands are within normal limits. Kidneys are within normal limits.  No hydronephrosis. Bladder is within normal limits. Stomach/Bowel: Stomach is within normal limits. No evidence of bowel obstruction. Normal appendix (series 4/image 64). Mild left colonic diverticulosis, without evidence of diverticulitis. Vascular/Lymphatic: No evidence of abdominal aortic aneurysm. Atherosclerotic calcifications of the abdominal aorta and branch vessels. No suspicious abdominopelvic lymphadenopathy. Reproductive: Status post hysterectomy. Bilateral ovaries are within normal limits. Other: No abdominopelvic ascites. Tiny fat containing bilateral inguinal hernias. Musculoskeletal: Status post PLIF at L4-5. Additional postsurgical changes involving the bilateral sacroiliac joints. Mild degenerative changes of the lumbar spine. Review of the MIP images confirms the above findings. IMPRESSION: No evidence of pulmonary embolism. No acute findings in the abdomen/pelvis. Status post cholecystectomy and hysterectomy. Additional postsurgical and ancillary findings as above. Emphysema (ICD10-J43.9). Electronically Signed   By: Julian Hy M.D.   On: 06/01/2022 00:39   CT Abdomen Pelvis W Contrast  Result Date: 06/01/2022 CLINICAL DATA:  Left chest pain x2 days, status post gallbladder surgery on 1/22, abdominal pain EXAM: CT ANGIOGRAPHY CHEST CT ABDOMEN AND PELVIS WITH CONTRAST TECHNIQUE: Multidetector CT imaging of the chest was performed using the standard protocol during bolus administration of intravenous contrast. Multiplanar CT image reconstructions and MIPs were obtained to evaluate the vascular anatomy. Multidetector CT imaging of the abdomen and pelvis was performed using the standard protocol during bolus administration of  intravenous contrast. RADIATION DOSE REDUCTION: This exam was performed according to the departmental dose-optimization program which includes automated exposure control, adjustment of the mA and/or kV according to patient size and/or use of iterative reconstruction technique. CONTRAST:  176m OMNIPAQUE IOHEXOL 350 MG/ML SOLN COMPARISON:  CT chest dated 04/30/2019. FINDINGS: CTA CHEST FINDINGS Cardiovascular: Satisfactory opacification of the bilateral pulmonary arteries to the segmental level. No evidence of pulmonary embolism. Although not tailored for evaluation of the thoracic aorta, there is no evidence of thoracic aortic aneurysm or dissection. Atherosclerotic calcifications of the aortic arch. Mild coronary atherosclerosis of the LAD and right coronary artery. Postsurgical changes related to prior CABG. The heart is normal in size.  No pericardial effusion. Mediastinum/Nodes: No suspicious mediastinal lymphadenopathy. Visualized thyroid is unremarkable. Lungs/Pleura: Mild paraseptal emphysematous changes, upper lung predominant. Dependent atelectasis in the bilateral upper and lower lobes. No focal consolidation. No suspicious pulmonary nodules. No pleural effusion or pneumothorax. Musculoskeletal: Mild degenerative changes of the mid/lower thoracic spine. Median sternotomy. Review of the MIP images confirms the above findings. CT ABDOMEN and PELVIS FINDINGS Hepatobiliary: Liver is within normal limits. Status post cholecystectomy. No intrahepatic or extrahepatic ductal dilatation. Pancreas: Within normal limits. Spleen: Within normal limits. Adrenals/Urinary Tract: Adrenal glands are within normal limits.  Kidneys are within normal limits.  No hydronephrosis. Bladder is within normal limits. Stomach/Bowel: Stomach is within normal limits. No evidence of bowel obstruction. Normal appendix (series 4/image 64). Mild left colonic diverticulosis, without evidence of diverticulitis. Vascular/Lymphatic: No evidence  of abdominal aortic aneurysm. Atherosclerotic calcifications of the abdominal aorta and branch vessels. No suspicious abdominopelvic lymphadenopathy. Reproductive: Status post hysterectomy. Bilateral ovaries are within normal limits. Other: No abdominopelvic ascites. Tiny fat containing bilateral inguinal hernias. Musculoskeletal: Status post PLIF at L4-5. Additional postsurgical changes involving the bilateral sacroiliac joints. Mild degenerative changes of the lumbar spine. Review of the MIP images confirms the above findings. IMPRESSION: No evidence of pulmonary embolism. No acute findings in the abdomen/pelvis. Status post cholecystectomy and hysterectomy. Additional postsurgical and ancillary findings as above. Emphysema (ICD10-J43.9). Electronically Signed   By: Julian Hy M.D.   On: 06/01/2022 00:39   DG Chest 2 View  Result Date: 05/31/2022 CLINICAL DATA:  Left-sided chest pain for 2 days, history of recent gallbladder surgery several days ago, initial encounter EXAM: CHEST - 2 VIEW COMPARISON:  10/22/2015 FINDINGS: Cardiac shadow is within normal limits. Postsurgical changes are again seen and stable. The lungs are well aerated bilaterally. Postsurgical changes in the right shoulder are seen. Degenerative changes of the thoracic spine are noted. IMPRESSION: No active cardiopulmonary disease. Electronically Signed   By: Inez Catalina M.D.   On: 05/31/2022 19:31     PROCEDURES:  Critical Care performed: No  .1-3 Lead EKG Interpretation  Performed by: Paulette Blanch, MD Authorized by: Paulette Blanch, MD     Interpretation: normal     ECG rate:  65   ECG rate assessment: normal     Rhythm: sinus rhythm     Ectopy: none     Conduction: normal   Comments:     Patient placed on cardiac monitor to evaluate for arrhythmias    MEDICATIONS ORDERED IN ED: Medications  sodium chloride 0.9 % bolus 500 mL (500 mLs Intravenous New Bag/Given 06/01/22 0006)  ondansetron (ZOFRAN) injection 4 mg (4  mg Intravenous Given 06/01/22 0007)  HYDROmorphone (DILAUDID) injection 0.5 mg (0.5 mg Intravenous Given 06/01/22 0008)  iohexol (OMNIPAQUE) 350 MG/ML injection 100 mL (100 mLs Intravenous Contrast Given 06/01/22 0022)  diphenhydrAMINE (BENADRYL) injection 25 mg (25 mg Intravenous Given 06/01/22 0111)     IMPRESSION / MDM / ASSESSMENT AND PLAN / ED COURSE  I reviewed the triage vital signs and the nursing notes.                             62 year old female presenting with chest and abdominal pain status post gallbladder surgery. Differential diagnosis includes, but is not limited to, ACS, aortic dissection, pulmonary embolism, cardiac tamponade, pneumothorax, pneumonia, pericarditis, myocarditis, GI-related causes including esophagitis/gastritis, and musculoskeletal chest wall pain.   Differential diagnosis includes, but is not limited to, ovarian cyst, ovarian torsion, acute appendicitis, diverticulitis, urinary tract infection/pyelonephritis, endometriosis, bowel obstruction, colitis, renal colic, gastroenteritis, hernia, etc. I have personally reviewed patient's records and note patient's op note from 05/22/2022 from [facility].  Patient's presentation is most consistent with acute presentation with potential threat to life or bodily function.  The patient is on the cardiac monitor to evaluate for evidence of arrhythmia and/or significant heart rate changes.  Laboratory results demonstrate normal WBC 9.8, normal hemoglobin 14.6, normal electrolytes including LFTs and lipase.  Hyperglycemia without elevation of anion gap.  Sets of negative troponins  and unremarkable EKG.  Chest x-ray is clear.  Will obtain CTA chest to evaluate for PE, CT abdomen/pelvis to evaluate for postoperative complications.  Will obtain UA.  Administer IV Dilaudid for pain.  With IV Zofran for nausea, initiate IV fluid hydration.  Will reassess.  Clinical Course as of 06/01/22 0141  Thu Jun 01, 2022  0106 Patient complains of  itching after CT scan.  No urticaria noted.  Phonation intact.  Posterior oropharynx not swollen.  Will administer IV Benadryl. [JS]  0127 Itching improved.  Updated patient on negative CTA chest for PE and negative CT abdomen/pelvis for postoperative complications.  Large stool burden noted.  Will discharge home on Bentyl, Lactulose and Zofran.  Strict return precautions given.  Patient verbalizes understanding and agrees with plan of care. [JS]    Clinical Course User Index [JS] Paulette Blanch, MD     FINAL CLINICAL IMPRESSION(S) / ED DIAGNOSES   Final diagnoses:  Nonspecific chest pain  Generalized abdominal pain  Constipation, unspecified constipation type     Rx / DC Orders   ED Discharge Orders          Ordered    dicyclomine (BENTYL) 20 MG tablet  Every 6 hours        06/01/22 0140    lactulose (CHRONULAC) 10 GM/15ML solution  Daily PRN        06/01/22 0140    ondansetron (ZOFRAN-ODT) 4 MG disintegrating tablet  Every 8 hours PRN        06/01/22 0140             Note:  This document was prepared using Dragon voice recognition software and may include unintentional dictation errors.   Paulette Blanch, MD 06/01/22 [phone]

## 2022-05-31 NOTE — ED Triage Notes (Signed)
Pt arrived via POV with reports having GB surgery on 1/22, pt c/o indigestion over the weekend, pt states she has been having L side chest pain x 2 days.

## 2022-05-31 NOTE — ED Notes (Signed)
POC was negative 

## 2022-06-01 ENCOUNTER — Emergency Department: Payer: Medicare HMO

## 2022-06-01 LAB — URINALYSIS, ROUTINE W REFLEX MICROSCOPIC
Bacteria, UA: NONE SEEN
Bilirubin Urine: NEGATIVE
Glucose, UA: >=500 mg/dL — AB
Hgb urine dipstick: NEGATIVE
Ketones, ur: NEGATIVE mg/dL
Leukocytes,Ua: NEGATIVE
Nitrite: NEGATIVE
Protein, ur: NEGATIVE mg/dL
Specific Gravity, Urine: >1.046 — ABNORMAL HIGH (ref 1.005–1.030)
pH: 6 (ref 5.0–8.0)

## 2022-06-01 MED ORDER — LACTULOSE 10 GM/15ML PO SOLN: 20.0000 g | Freq: Every day | ORAL | 0 refills | Status: AC | PRN

## 2022-06-01 MED ORDER — DIPHENHYDRAMINE HCL 50 MG/ML IJ SOLN
25.0000 mg | Freq: Once | INTRAMUSCULAR | Status: AC
  Administered 2022-06-01: 25 mg via INTRAVENOUS
  Filled 2022-06-01: qty 1

## 2022-06-01 MED ORDER — IOHEXOL 350 MG/ML SOLN
100.0000 mL | Freq: Once | INTRAVENOUS | Status: AC | PRN
  Administered 2022-06-01: 100 mL via INTRAVENOUS

## 2022-06-01 MED ORDER — DICYCLOMINE HCL 20 MG PO TABS: 20.0000 mg | ORAL_TABLET | Freq: Four times a day (QID) | ORAL | 0 refills | Status: AC

## 2022-06-01 MED ORDER — ONDANSETRON 4 MG PO TBDP: 4.0000 mg | ORAL_TABLET | Freq: Three times a day (TID) | ORAL | 0 refills | Status: AC | PRN

## 2022-06-01 MED ORDER — LACTULOSE 10 GM/15ML PO SOLN
30.0000 g | Freq: Once | ORAL | Status: AC
  Administered 2022-06-01: 30 g via ORAL
  Filled 2022-06-01: qty 60

## 2022-06-01 NOTE — Discharge Instructions (Signed)
Continue taking Colace.  You may take Bentyl and Zofran as needed for abdominal discomfort and nausea.  Take Lactulose as needed for bowel movements.  Return to the ER for worsening symptoms, persistent vomiting, difficulty breathing or other concerns.

## 2022-07-07 ENCOUNTER — Encounter (HOSPITAL_COMMUNITY): Payer: Self-pay

## 2022-07-07 ENCOUNTER — Emergency Department (HOSPITAL_COMMUNITY): Payer: Medicare HMO

## 2022-07-07 ENCOUNTER — Other Ambulatory Visit: Payer: Self-pay

## 2022-07-07 ENCOUNTER — Ambulatory Visit
Admission: EM | Admit: 2022-07-07 | Discharge: 2022-07-07 | Disposition: A | Discharge status: Home / Self Care | Payer: Medicare HMO

## 2022-07-07 ENCOUNTER — Emergency Department (HOSPITAL_COMMUNITY)
Admission: EM | Admit: 2022-07-07 | Discharge: 2022-07-07 | Disposition: A | Discharge status: Home / Self Care | Payer: Medicare HMO | Attending: Emergency Medicine | Admitting: Emergency Medicine

## 2022-07-07 DIAGNOSIS — Z79890 Hormone replacement therapy (postmenopausal): Secondary | ICD-10-CM | POA: Diagnosis not present

## 2022-07-07 DIAGNOSIS — Z951 Presence of aortocoronary bypass graft: Secondary | ICD-10-CM | POA: Insufficient documentation

## 2022-07-07 DIAGNOSIS — Z9049 Acquired absence of other specified parts of digestive tract: Secondary | ICD-10-CM | POA: Insufficient documentation

## 2022-07-07 DIAGNOSIS — E119 Type 2 diabetes mellitus without complications: Secondary | ICD-10-CM | POA: Insufficient documentation

## 2022-07-07 DIAGNOSIS — R1033 Periumbilical pain: Secondary | ICD-10-CM

## 2022-07-07 DIAGNOSIS — K449 Diaphragmatic hernia without obstruction or gangrene: Secondary | ICD-10-CM | POA: Diagnosis not present

## 2022-07-07 DIAGNOSIS — G8918 Other acute postprocedural pain: Secondary | ICD-10-CM | POA: Diagnosis present

## 2022-07-07 DIAGNOSIS — Z7984 Long term (current) use of oral hypoglycemic drugs: Secondary | ICD-10-CM | POA: Insufficient documentation

## 2022-07-07 DIAGNOSIS — E039 Hypothyroidism, unspecified: Secondary | ICD-10-CM | POA: Diagnosis not present

## 2022-07-07 DIAGNOSIS — R52 Pain, unspecified: Secondary | ICD-10-CM

## 2022-07-07 LAB — URINALYSIS, ROUTINE W REFLEX MICROSCOPIC
Bacteria, UA: NONE SEEN
Bilirubin Urine: NEGATIVE
Glucose, UA: >=500 mg/dL — AB
Hgb urine dipstick: NEGATIVE
Ketones, ur: NEGATIVE mg/dL
Leukocytes,Ua: NEGATIVE
Nitrite: NEGATIVE
Protein, ur: NEGATIVE mg/dL
Specific Gravity, Urine: 1.02 (ref 1.005–1.030)
pH: 6 (ref 5.0–8.0)

## 2022-07-07 LAB — COMPREHENSIVE METABOLIC PANEL
ALT: 9 U/L (ref 0–44)
AST: 19 U/L (ref 15–41)
Albumin: 3.8 g/dL (ref 3.5–5.0)
Alkaline Phosphatase: 80 U/L (ref 38–126)
Anion gap: 10 (ref 5–15)
BUN: 14 mg/dL (ref 6–20)
CO2: 23 mmol/L (ref 22–32)
Calcium: 9.3 mg/dL (ref 8.9–10.3)
Chloride: 105 mmol/L (ref 98–111)
Creatinine, Ser: 0.95 mg/dL (ref 0.44–1.00)
GFR, Estimated: >60 mL/min (ref >60)
Glucose, Bld: 158 mg/dL — ABNORMAL HIGH (ref 70–99)
Potassium: 3.8 mmol/L (ref 3.5–5.1)
Sodium: 138 mmol/L (ref 135–145)
Total Bilirubin: 0.3 mg/dL (ref 0.3–1.2)
Total Protein: 6.5 g/dL (ref 6.5–8.1)

## 2022-07-07 LAB — CBC WITH DIFFERENTIAL/PLATELET
Abs Immature Granulocytes: 0.03 10*3/uL (ref 0.00–0.07)
Basophils Absolute: 0.1 10*3/uL (ref 0.0–0.1)
Basophils Relative: 1 %
Eosinophils Absolute: 0.3 10*3/uL (ref 0.0–0.5)
Eosinophils Relative: 3 %
HCT: 47.5 % — ABNORMAL HIGH (ref 36.0–46.0)
Hemoglobin: 15.8 g/dL — ABNORMAL HIGH (ref 12.0–15.0)
Immature Granulocytes: 0 %
Lymphocytes Relative: 47 %
Lymphs Abs: 4.2 10*3/uL — ABNORMAL HIGH (ref 0.7–4.0)
MCH: 29 pg (ref 26.0–34.0)
MCHC: 33.3 g/dL (ref 30.0–36.0)
MCV: 87.2 fL (ref 80.0–100.0)
Monocytes Absolute: 0.6 10*3/uL (ref 0.1–1.0)
Monocytes Relative: 6 %
Neutro Abs: 3.8 10*3/uL (ref 1.7–7.7)
Neutrophils Relative %: 43 %
Platelets: 237 10*3/uL (ref 150–400)
RBC: 5.45 MIL/uL — ABNORMAL HIGH (ref 3.87–5.11)
RDW: 14.4 % (ref 11.5–15.5)
WBC: 8.9 10*3/uL (ref 4.0–10.5)
nRBC: 0 % (ref 0.0–0.2)

## 2022-07-07 LAB — LIPASE, BLOOD: Lipase: 40 U/L (ref 11–51)

## 2022-07-07 MED ORDER — DOXYCYCLINE HYCLATE 100 MG PO TABS
100.0000 mg | ORAL_TABLET | Freq: Once | ORAL | Status: AC
  Administered 2022-07-07: 100 mg via ORAL
  Filled 2022-07-07: qty 1

## 2022-07-07 MED ORDER — DOXYCYCLINE HYCLATE 100 MG PO CAPS: 100.0000 mg | ORAL_CAPSULE | Freq: Two times a day (BID) | ORAL | 0 refills | Status: AC

## 2022-07-07 MED ORDER — IOHEXOL 350 MG/ML SOLN
75.0000 mL | Freq: Once | INTRAVENOUS | Status: AC | PRN
  Administered 2022-07-07: 75 mL via INTRAVENOUS

## 2022-07-07 NOTE — Discharge Instructions (Signed)
Go to the emergency department for evaluation of your abdominal pain and concern for your surgical wound.

## 2022-07-07 NOTE — ED Notes (Signed)
Patient transported to CT 

## 2022-07-07 NOTE — ED Notes (Signed)
Patient is being discharged from the Urgent Care and sent to the Emergency Department via POV . Per Barkley Boards NP, patient is in need of higher level of care due to abdominal pain. Patient is aware and verbalizes understanding of plan of care.  Vitals:   07/07/22 1705 07/07/22 1709  BP:  (!) 160/76  Pulse: 67   Resp: 18   Temp: 97.8 F (36.6 C)   SpO2: 95%

## 2022-07-07 NOTE — ED Provider Triage Note (Signed)
Emergency Medicine Provider Triage Evaluation Note  Desiree Dragotta , a 62 y.o. female  was evaluated in triage.  Pt complains of abdominal pain, post lap chole in January developed umbilical abscess, wound opened and drained.  Followed by general surgery.  Continues to have burning pain deep in her abdomen below this area..  Review of Systems  Positive: Pain, drainage Negative: Fever  Physical Exam  BP (!) 175/93 (BP Location: Right Arm)   Pulse 66   Temp 98.2 F (36.8 C)   Resp 16   Ht 5' 1.5" (1.562 m)   Wt 64.9 kg   SpO2 92%   BMI 26.58 kg/m  Gen:   Awake, no distress   Resp:  Normal effort  MSK:   Moves extremities without difficulty  Other:  Packing in place  Medical Decision Making  Medically screening exam initiated at 6:25 PM.  Appropriate orders placed.  [place] was informed that the remainder of the evaluation will be completed by another provider, this initial triage assessment does not replace that evaluation, and the importance of remaining in the ED until their evaluation is complete.     Tacy Learn, PA-C 07/07/22 [id number]

## 2022-07-07 NOTE — ED Provider Notes (Signed)
Roderic Palau    CSN: [id number] Arrival date & time: 07/07/22  1653      History   Chief Complaint Chief Complaint  Patient presents with   Wound Check    HPI [name] is a 62 y.o. female.  Patient presents with surgical wound pain x 3 weeks from laparoscopic cholecystectomy that was was done on 05/22/2022.  She was seen at her surgeon's office today; the note is not available in her chart yet.  Patient reports scant yellow discharge from the wound and "burning" pain in her periumbilical area.  No fever, chills, or other symptoms.  In additiona to her visit today, patient was seen by surgeon on 06/30/2022, 06/14/2022, and 06/07/2022 for postoperative wound checks.  Her medical history includes hypertension, diabetes, chronic pain, bipolar disorder.    The history is provided by the patient and medical records.    Past Medical History:  Diagnosis Date   Allergy    Anxiety    Arthritis    Back pain, chronic    Bipolar disorder (HCC)    denies   Bronchitis    hx-no inhalers at home   Carotid bruit    Carpal tunnel syndrome on both sides    Cataract    Coronary artery disease    DDD (degenerative disc disease), lumbar    Depression    Diabetes mellitus    type II   Ejection fraction    .   GERD (gastroesophageal reflux disease)    Hx of CABG    Hypercholesteremia    Hypertension    Hypothyroidism    Multiple thyroid nodules    Myocardial infarction ([place]) 11/20/2006   "4 in one section"   Neuromuscular disorder ([place])    Nodule of neck    Pneumonia    PTSD (post-traumatic stress disorder)    Sleep apnea    cpap     Patient Active Problem List   Diagnosis Date Noted   S/P shoulder replacement, right 04/15/2020   S/P hysterectomy 06/24/2018   History of colonic polyps 06/24/2018   Polycythemia, secondary 05/13/2017   Diabetes ([place]) 07/29/2015   History of myocardial infarction 12/09/2014   Acquired hypothyroidism 10/09/2014   Benign essential  HTN 10/09/2014   Bipolar affective disorder, depressed, moderate ([place]) 10/09/2014   Chronic pain syndrome 10/09/2014   Coccyx pain 10/09/2014   Recurrent falls 10/09/2014   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/03/2014   Multinodular goiter 09/15/2013   Carotid bruit    Depression    Coronary artery disease    Hyperlipidemia    Neuromuscular disorder (HCC)    Arthritis    DDD (degenerative disc disease), lumbar    Hx of CABG    Spinal stenosis of lumbar region 02/08/2012   Neuropathy 02/08/2012   Back pain, chronic    TOBACCO ABUSE 11/20/2008    Past Surgical History:  Procedure Laterality Date   ABDOMINAL HYSTERECTOMY  1984   BACK SURGERY     fusion T4- T-5   BREAST EXCISIONAL BIOPSY Right    benign   BREAST EXCISIONAL BIOPSY Left    benign   CHOLECYSTECTOMY     COLONOSCOPY     CORONARY ARTERY BYPASS GRAFT  2008   KNEE SURGERY Left 2009   Menisus tear   ORIF ANKLE FRACTURE Right 07/12/2012   Procedure: OPEN REDUCTION INTERNAL FIXATION (ORIF) RIGHT ANKLE FRACTURE;  Surgeon: Alta Corning, MD;  Location: [facility];  Service: Orthopedics;  Laterality:  Right;   SACROILIAC JOINT FUSION Left 11/19/2019   Procedure: LEFT SACROILIAC JOINT FUSION;  Surgeon: Phylliss Bob, MD;  Location: [facility];  Service: Orthopedics;  Laterality: Left;  posterior   SACROILIAC JOINT FUSION Right 10/05/2021   Procedure: RIGHT-SIDED SACROILIAC JOINT FUSION;  Surgeon: Phylliss Bob, MD;  Location: [facility];  Service: Orthopedics;  Laterality: Right;   SHOULDER HEMI-ARTHROPLASTY Right 04/15/2020   Procedure: SHOULDER HEMI-ARTHROPLASTY;  Surgeon: Tania Ade, MD;  Location: [facility];  Service: Orthopedics;  Laterality: Right;  block   SHOULDER SURGERY Right 11/08/2016   rotator cuff   TUBAL LIGATION     WRIST SURGERY Left     OB History   No obstetric history on file.      Home Medications    Prior to Admission medications   Medication Sig Start Date End Date  Taking? Authorizing Provider  atenolol (TENORMIN) 25 MG tablet Take 25 mg by mouth daily.    [provider]  Blood Glucose Calibration (ACCU-CHEK GUIDE CONTROL) LIQD 1 each by Does not apply route as needed. Use to calibrate glucometer prn; E11.9 07/19/18   Renato Shin, MD  carvedilol (COREG) 3.125 MG tablet TAKE 1 TABLET TWICE DAILY 04/28/19   Jacelyn Pi, Lilia Argue, MD  diazepam (VALIUM) 5 MG tablet Take 5 mg by mouth at bedtime as needed (sleep).    [provider]  dicyclomine (BENTYL) 20 MG tablet Take 1 tablet (20 mg total) by mouth every 6 (six) hours. 06/01/22   Paulette Blanch, MD  diphenhydrAMINE (BENADRYL) 25 MG tablet Take 1 tablet (25 mg total) by mouth every 6 (six) hours as needed. Patient not taking: Reported on 09/21/2021 09/11/21   Mesner, Corene Cornea, MD  Dulaglutide (TRULICITY) 1.5 0000000 SOPN Inject 1.5 mg into the skin every Saturday.    [provider]  DULoxetine (CYMBALTA) 60 MG capsule Take 60 mg by mouth 2 (two) times daily.    [provider]  Empagliflozin-metFORMIN HCl (SYNJARDY) 12.08-998 MG TABS Take 1 tablet by mouth daily.    [provider]  ezetimibe (ZETIA) 10 MG tablet Take 1 tablet (10 mg total) by mouth daily. Please make overdue appt with Dr. Acie Fredrickson before anymore refills. 1st attempt 07/22/19   Nahser, Wonda Cheng, MD  famotidine (PEPCID) 20 MG tablet Take 1 tablet (20 mg total) by mouth 2 (two) times daily. 11/24/18   Jacelyn Pi, Lilia Argue, MD  fenofibrate (TRICOR) 145 MG tablet Take 1 tablet (145 mg total) by mouth daily. Please make overdue appt with Dr. Acie Fredrickson before anymore refills. 1st attempt Patient taking differently: Take 145 mg by mouth daily. 12/05/19   Nahser, Wonda Cheng, MD  fluticasone (FLONASE) 50 MCG/ACT nasal spray Place 2 sprays into both nostrils at bedtime.    [provider]  glucose blood (ACCU-CHEK GUIDE) test strip Use to check blood sugar 3 times daily. 07/22/18   Renato Shin, MD   HYDROcodone-acetaminophen (NORCO/VICODIN) 5-325 MG tablet Take 1 tablet by mouth every 4 (four) hours as needed for moderate pain or severe pain (for breakthrough pain on top of baseline meds PRN x10 days). 10/05/21 10/05/22  McKenzie, Lennie Muckle, PA-C  hydrocortisone (ANUSOL-HC) 25 MG suppository Place 1 suppository (25 mg total) rectally 2 (two) times daily. Patient not taking: Reported on 09/21/2021 09/11/21   Mesner, Corene Cornea, MD  lactulose (CHRONULAC) 10 GM/15ML solution Take 30 mLs (20 g total) by mouth daily as needed for mild constipation. 06/01/22   Paulette Blanch, MD  Lancets Misc. (  ACCU-CHEK FASTCLIX LANCET) KIT 1 each by Does not apply route 3 (three) times daily. Use to monitor glucose levels TID; E11.9 07/19/18   Renato Shin, MD  levothyroxine (SYNTHROID) 100 MCG tablet Take 100 mcg by mouth daily before breakfast.    [provider]  lidocaine (XYLOCAINE) 2 % solution Use as directed 15 mLs in the mouth or throat as needed for mouth pain. Patient not taking: Reported on 09/21/2021 09/11/21   Mesner, Corene Cornea, MD  Magnesium 250 MG TABS Take 500 mg by mouth at bedtime.    [provider]  naloxone [facility]) nasal spray 4 mg/0.1 mL Place 1 spray into the nose as needed (opioid overdose).    [provider]  nitroGLYCERIN (NITROSTAT) 0.4 MG SL tablet Place 0.4 mg under the tongue every 5 (five) minutes as needed for chest pain.    [provider]  Omega-3 Fatty Acids (FISH OIL PO) Take 2,400 mg by mouth at bedtime.    [provider]  omeprazole (PRILOSEC) 40 MG capsule TAKE 1 CAPSULE DAILY 30 MINUTES BEFORE A MEAL Patient taking differently: Take 40 mg by mouth daily as needed (for acid reflux take 30 minutes before a meal). 09/16/18   Daleen Squibb, MD  ondansetron (ZOFRAN-ODT) 4 MG disintegrating tablet Take 1 tablet (4 mg total) by mouth every 8 (eight) hours as needed for nausea or vomiting. 06/01/22   Paulette Blanch, MD  Oxycodone HCl 10 MG TABS Take 10 mg  by mouth every 4 (four) hours as needed (pain). 07/04/18   [provider]  pregabalin (LYRICA) 100 MG capsule Take 100 mg by mouth 3 (three) times daily.    [provider]  PROCTO-MED HC 2.5 % rectal cream Place 1 application. rectally daily as needed for pain. 09/12/21   [provider]  rosuvastatin (CRESTOR) 40 MG tablet Take 40 mg by mouth daily.    [provider]  senna-docusate (SENOKOT-S) 8.6-50 MG tablet Take 1 tablet by mouth at bedtime.    [provider]  tiZANidine (ZANAFLEX) 4 MG tablet Take 4 mg by mouth 3 (three) times daily as needed for muscle spasms.    [provider]  Vitamin D, Ergocalciferol, (DRISDOL) 1.25 MG (50000 UNIT) CAPS capsule Take 50,000 Units by mouth every 7 (seven) days. Saturdays    [provider]    Family History Family History  Problem Relation Age of Onset   Heart disease Mother    Hyperlipidemia Mother    Hypertension Mother    Heart disease Father    Stroke Father    Hypertension Father    Hyperlipidemia Father    Hypertension Sister    Hypertension Brother    Cancer Daughter        59 different kinds of cancer   Hypertension Maternal Grandmother    Hypertension Maternal Grandfather    Cancer Maternal Grandfather    Hypertension Paternal Grandmother    Hypertension Paternal Grandfather    Cancer Maternal Aunt        breast and ovarian   Cancer Paternal Aunt        breast   Colon cancer Neg Hx    Esophageal cancer Neg Hx    Rectal cancer Neg Hx    Stomach cancer Neg Hx    Liver cancer Neg Hx     Social History Social History   Tobacco Use   Smoking status: Every Day    Packs/day: 0.50    Years: 38.00  Total pack years: 19.00    Types: Cigarettes   Smokeless tobacco: Never  Vaping Use   Vaping Use: Never used  Substance Use Topics   Alcohol use: No   Drug use: No     Allergies   Latuda [lurasidone hcl], Coconut flavor, Codeine, Ibuprofen, Prednisone,  Cyclobenzaprine, Meloxicam, and Morphine   Review of Systems Review of Systems  Constitutional:  Negative for chills and fever.  Gastrointestinal:  Positive for abdominal pain. Negative for constipation, diarrhea, nausea and vomiting.  Skin:  Positive for wound.  All other systems reviewed and are negative.    Physical Exam Triage Vital Signs ED Triage Vitals  Enc Vitals Group     BP      Pulse      Resp      Temp      Temp src      SpO2      Weight      Height      Head Circumference      Peak Flow      Pain Score      Pain Loc      Pain Edu?      Excl. in [place]?    No data found.  Updated Vital Signs BP (!) 160/76   Pulse 67   Temp 97.8 F (36.6 C)   Resp 18   SpO2 95%   Visual Acuity Right Eye Distance:   Left Eye Distance:   Bilateral Distance:    Right Eye Near:   Left Eye Near:    Bilateral Near:     Physical Exam Vitals and nursing note reviewed.  Constitutional:      General: She is not in acute distress.    Appearance: Normal appearance. She is well-developed. She is not ill-appearing.  HENT:     Mouth/Throat:     Mouth: Mucous membranes are moist.  Cardiovascular:     Rate and Rhythm: Normal rate and regular rhythm.     Heart sounds: Normal heart sounds.  Pulmonary:     Effort: Pulmonary effort is normal. No respiratory distress.     Breath sounds: Normal breath sounds.  Abdominal:     General: Bowel sounds are normal.     Palpations: Abdomen is soft.     Tenderness: There is abdominal tenderness in the periumbilical area. There is no guarding or rebound.  Musculoskeletal:     Cervical back: Neck supple.  Skin:    General: Skin is warm and dry.     Findings: Lesion present.     Comments: Healing umbilical wound with 1 mm opening superior wound edge.  No drainage noted.   Neurological:     Mental Status: She is alert.  Psychiatric:        Mood and Affect: Affect is tearful.      UC Treatments / Results  Labs (all labs ordered  are listed, but only abnormal results are displayed) Labs Reviewed - No data to display  EKG   Radiology No results found.  Procedures Procedures (including critical care time)  Medications Ordered in UC Medications - No data to display  Initial Impression / Assessment and Plan / UC Course  I have reviewed the triage vital signs and the nursing notes.  Pertinent labs & imaging results that were available during my care of the patient were reviewed by me and considered in my medical decision making (see chart for details).    Periumbilical abdominal pain, persistent wound pain.  Patient is tearful throughout exam, stating her periumbilical wound is painful.  Discussed limitations of evaluation in an urgent care setting.  Sending her to the ED for evaluation due to her severe 10/10 abdominal pain.  She declines EMS.  She prefers to drive herself to the ED.    Final Clinical Impressions(s) / UC Diagnoses   Final diagnoses:  Periumbilical abdominal pain  Persistent wound pain     Discharge Instructions      Go to the emergency department for evaluation of your abdominal pain and concern for your surgical wound.     ED Prescriptions   None    I have reviewed the PDMP during this encounter.   Sharion Balloon, NP 07/07/22 1728

## 2022-07-07 NOTE — Discharge Instructions (Signed)
Please return to the ED with any new or worsening signs or symptoms such as fevers Please follow-up with your general surgeon for further management Please utilize wound cleanser that was provided to you by general surgeon at appointment today Please begin taking doxycycline.  You will take this for 5 days twice daily.  You received your first dose here tonight.

## 2022-07-07 NOTE — ED Triage Notes (Signed)
Pt reports she had a laprascopic cholecystectomy on 1/22 and then on 1/27 she noticed purulent drainage and reports it is burning and "it is on fire." Pt denies fevers. Incision to umbilical appears red.

## 2022-07-07 NOTE — ED Triage Notes (Signed)
Patient to Urgent Care with complaints of wound pain. Patient had a laparoscopic cholecystectomy on 1/22. States that at the area of her umbilical incision she is having irritation and burning pain.   Describes burning under her skin x3 weeks. Reports she has been seen by her general surgeon who has provided no answers for her symptoms. States it has been infected since 1/27. Still having some yellow drainage.

## 2022-07-07 NOTE — ED Provider Notes (Signed)
[facility] Provider Note   CSN: [id number] Arrival date & time: 07/07/22  1801     History  Chief Complaint  Patient presents with   Post-op Problem    [name] is a 62 y.o. female with medical history of chronic back pain, bipolar disorder, anxiety, DDD, depression, diabetes, GERD, CABG, hypothyroid.  Patient presents to ED for evaluation of abdominal pain.  Patient reports that back in January she had cholecystectomy with surgeon at [facility].  Patient reports that the day after she had this operation performed, she developed abdominal pain described as a burning in her umbilical region.  The patient has been seen numerous times by her surgeon for follow-up, has been advised that her wounds are healing as expected.  The patient was actually seen today by her surgeon with reassuring workup.  Patient then presented to urgent care where she was advised to present to ED for evaluation due to her location of abdominal pain.  The patient denies any recent fevers, nausea, vomiting, diarrhea, constipation, dysuria, chest pain or shortness of breath.  HPI     Home Medications Prior to Admission medications   Medication Sig Start Date End Date Taking? Authorizing Provider  doxycycline (VIBRAMYCIN) 100 MG capsule Take 1 capsule (100 mg total) by mouth 2 (two) times daily. 07/07/22  Yes Azucena Cecil, PA-C  atenolol (TENORMIN) 25 MG tablet Take 25 mg by mouth daily.    [provider]  Blood Glucose Calibration (ACCU-CHEK GUIDE CONTROL) LIQD 1 each by Does not apply route as needed. Use to calibrate glucometer prn; E11.9 07/19/18   Renato Shin, MD  carvedilol (COREG) 3.125 MG tablet TAKE 1 TABLET TWICE DAILY 04/28/19   Jacelyn Pi, Lilia Argue, MD  diazepam (VALIUM) 5 MG tablet Take 5 mg by mouth at bedtime as needed (sleep).    [provider]  dicyclomine (BENTYL) 20 MG tablet Take 1 tablet  (20 mg total) by mouth every 6 (six) hours. 06/01/22   Paulette Blanch, MD  diphenhydrAMINE (BENADRYL) 25 MG tablet Take 1 tablet (25 mg total) by mouth every 6 (six) hours as needed. Patient not taking: Reported on 09/21/2021 09/11/21   Mesner, Corene Cornea, MD  Dulaglutide (TRULICITY) 1.5 0000000 SOPN Inject 1.5 mg into the skin every Saturday.    [provider]  DULoxetine (CYMBALTA) 60 MG capsule Take 60 mg by mouth 2 (two) times daily.    [provider]  Empagliflozin-metFORMIN HCl (SYNJARDY) 12.08-998 MG TABS Take 1 tablet by mouth daily.    [provider]  ezetimibe (ZETIA) 10 MG tablet Take 1 tablet (10 mg total) by mouth daily. Please make overdue appt with Dr. Acie Fredrickson before anymore refills. 1st attempt 07/22/19   Nahser, Wonda Cheng, MD  famotidine (PEPCID) 20 MG tablet Take 1 tablet (20 mg total) by mouth 2 (two) times daily. 11/24/18   Jacelyn Pi, Lilia Argue, MD  fenofibrate (TRICOR) 145 MG tablet Take 1 tablet (145 mg total) by mouth daily. Please make overdue appt with Dr. Acie Fredrickson before anymore refills. 1st attempt Patient taking differently: Take 145 mg by mouth daily. 12/05/19   Nahser, Wonda Cheng, MD  fluticasone (FLONASE) 50 MCG/ACT nasal spray Place 2 sprays into both nostrils at bedtime.    [provider]  glucose blood (ACCU-CHEK GUIDE) test strip Use to check blood sugar 3 times daily. 07/22/18   Renato Shin, MD  HYDROcodone-acetaminophen (NORCO/VICODIN) 5-325 MG tablet  Take 1 tablet by mouth every 4 (four) hours as needed for moderate pain or severe pain (for breakthrough pain on top of baseline meds PRN x10 days). 10/05/21 10/05/22  McKenzie, Lennie Muckle, PA-C  hydrocortisone (ANUSOL-HC) 25 MG suppository Place 1 suppository (25 mg total) rectally 2 (two) times daily. Patient not taking: Reported on 09/21/2021 09/11/21   Mesner, Corene Cornea, MD  lactulose (CHRONULAC) 10 GM/15ML solution Take 30 mLs (20 g total) by mouth daily as needed for mild constipation. 06/01/22   Paulette Blanch, MD  Lancets Misc. (ACCU-CHEK FASTCLIX LANCET) KIT 1 each by Does not apply route 3 (three) times daily. Use to monitor glucose levels TID; E11.9 07/19/18   Renato Shin, MD  levothyroxine (SYNTHROID) 100 MCG tablet Take 100 mcg by mouth daily before breakfast.    [provider]  lidocaine (XYLOCAINE) 2 % solution Use as directed 15 mLs in the mouth or throat as needed for mouth pain. Patient not taking: Reported on 09/21/2021 09/11/21   Mesner, Corene Cornea, MD  Magnesium 250 MG TABS Take 500 mg by mouth at bedtime.    [provider]  naloxone [facility]) nasal spray 4 mg/0.1 mL Place 1 spray into the nose as needed (opioid overdose).    [provider]  nitroGLYCERIN (NITROSTAT) 0.4 MG SL tablet Place 0.4 mg under the tongue every 5 (five) minutes as needed for chest pain.    [provider]  Omega-3 Fatty Acids (FISH OIL PO) Take 2,400 mg by mouth at bedtime.    [provider]  omeprazole (PRILOSEC) 40 MG capsule TAKE 1 CAPSULE DAILY 30 MINUTES BEFORE A MEAL Patient taking differently: Take 40 mg by mouth daily as needed (for acid reflux take 30 minutes before a meal). 09/16/18   Daleen Squibb, MD  ondansetron (ZOFRAN-ODT) 4 MG disintegrating tablet Take 1 tablet (4 mg total) by mouth every 8 (eight) hours as needed for nausea or vomiting. 06/01/22   Paulette Blanch, MD  Oxycodone HCl 10 MG TABS Take 10 mg by mouth every 4 (four) hours as needed (pain). 07/04/18   [provider]  pregabalin (LYRICA) 100 MG capsule Take 100 mg by mouth 3 (three) times daily.    [provider]  PROCTO-MED HC 2.5 % rectal cream Place 1 application. rectally daily as needed for pain. 09/12/21   [provider]  rosuvastatin (CRESTOR) 40 MG tablet Take 40 mg by mouth daily.    [provider]  senna-docusate (SENOKOT-S) 8.6-50 MG tablet Take 1 tablet by mouth at bedtime.    [provider]  tiZANidine (ZANAFLEX) 4 MG tablet Take 4  mg by mouth 3 (three) times daily as needed for muscle spasms.    [provider]  Vitamin D, Ergocalciferol, (DRISDOL) 1.25 MG (50000 UNIT) CAPS capsule Take 50,000 Units by mouth every 7 (seven) days. Saturdays    [provider]      Allergies    Anette Guarneri [lurasidone hcl], Coconut flavor, Codeine, Ibuprofen, Prednisone, Cyclobenzaprine, Meloxicam, Other, and Morphine    Review of Systems   Review of Systems  Gastrointestinal:  Positive for abdominal pain.  All other systems reviewed and are negative.   Physical Exam Updated Vital Signs BP (!) 161/69   Pulse (!) 57   Temp 98.2 F (36.8 C)   Resp 16   Ht 5' 1.5" (1.562 m)   Wt 64.9 kg   SpO2 95%   BMI 26.58 kg/m  Physical Exam Vitals and nursing  note reviewed.  Constitutional:      General: She is not in acute distress.    Appearance: Normal appearance. She is not ill-appearing, toxic-appearing or diaphoretic.  HENT:     Head: Normocephalic and atraumatic.     Nose: Nose normal. No congestion.     Mouth/Throat:     Mouth: Mucous membranes are moist.     Pharynx: Oropharynx is clear.  Eyes:     Extraocular Movements: Extraocular movements intact.     Conjunctiva/sclera: Conjunctivae normal.     Pupils: Pupils are equal, round, and reactive to light.  Cardiovascular:     Rate and Rhythm: Normal rate and regular rhythm.  Pulmonary:     Effort: Pulmonary effort is normal.     Breath sounds: Normal breath sounds. No wheezing.  Abdominal:     General: Abdomen is flat. Bowel sounds are normal.     Palpations: Abdomen is soft.     Tenderness: There is no abdominal tenderness.     Comments: Healing postsurgical scars noted.  Surgical scar about umbilicus minimally dehisced with slight surrounding erythema however no drainage, no induration.  Cotton tip applicator protruding from within wound placed by her general surgeon.  Musculoskeletal:     Cervical back: Normal range of motion and neck supple.  Skin:     General: Skin is warm and dry.     Capillary Refill: Capillary refill takes less than 2 seconds.  Neurological:     Mental Status: She is alert and oriented to person, place, and time.     ED Results / Procedures / Treatments   Labs (all labs ordered are listed, but only abnormal results are displayed) Labs Reviewed  CBC WITH DIFFERENTIAL/PLATELET - Abnormal; Notable for the following components:      Result Value   RBC 5.45 (*)    Hemoglobin 15.8 (*)    HCT 47.5 (*)    Lymphs Abs 4.2 (*)    All other components within normal limits  COMPREHENSIVE METABOLIC PANEL - Abnormal; Notable for the following components:   Glucose, Bld 158 (*)    All other components within normal limits  URINALYSIS, ROUTINE W REFLEX MICROSCOPIC - Abnormal; Notable for the following components:   Glucose, UA >=500 (*)    All other components within normal limits  LIPASE, BLOOD    EKG None  Radiology CT Abdomen Pelvis W Contrast  Result Date: 07/07/2022 CLINICAL DATA:  Abdominal pain, postop cholecystectomy, umbilical abscess packing placed EXAM: CT ABDOMEN AND PELVIS WITH CONTRAST TECHNIQUE: Multidetector CT imaging of the abdomen and pelvis was performed using the standard protocol following bolus administration of intravenous contrast. RADIATION DOSE REDUCTION: This exam was performed according to the departmental dose-optimization program which includes automated exposure control, adjustment of the mA and/or kV according to patient size and/or use of iterative reconstruction technique. CONTRAST:  3m OMNIPAQUE IOHEXOL 350 MG/ML SOLN COMPARISON:  CT examination dated June 01, 2018 FINDINGS: Lower chest: No acute abnormality. Hepatobiliary: No focal liver abnormality is seen. Status post cholecystectomy. No biliary dilatation. Pancreas: Unremarkable. No pancreatic ductal dilatation or surrounding inflammatory changes. Spleen: Normal in size without focal abnormality. Adrenals/Urinary Tract: Adrenal  glands are unremarkable. Kidneys are normal, without renal calculi, focal lesion, or hydronephrosis. Bladder is unremarkable. Stomach/Bowel: Small hiatal hernia. Mild thickening of the distal esophageal wall. Stomach is within normal limits. Appendix appears normal. No evidence of bowel wall thickening, distention, or inflammatory changes. Sigmoid colonic diverticulosis without evidence of acute diverticulitis. Vascular/Lymphatic: Aortic atherosclerosis.  No enlarged abdominal or pelvic lymph nodes. Reproductive: Status post hysterectomy. No adnexal masses. Other: Mild fat stranding about the umbilicus without evidence of fluid collection or abscess no abdominal/pelvic ascites Musculoskeletal: Posterior spinal and interbody fusion at L4-L5. Bilateral sacroiliac joint hardware. IMPRESSION: 1. Mild fat stranding about the umbilicus without evidence of fluid collection or abscess. Clinical correlation is suggested. 2. Small hiatal hernia. Mild thickening of the distal esophageal wall, which may represent esophagitis. Gastroenterology consultation and endoscopy for further evaluation is suggested. 3. Sigmoid colonic diverticulosis without evidence of acute diverticulitis. 4. Aortic atherosclerosis. 5. Posterior spinal and interbody fusion at L4-L5. Bilateral sacroiliac joint hardware. Aortic Atherosclerosis (ICD10-I70.0). Electronically Signed   By: Keane Police D.O.   On: 07/07/2022 22:05    Procedures Procedures   Medications Ordered in ED Medications  iohexol (OMNIPAQUE) 350 MG/ML injection 75 mL (75 mLs Intravenous Contrast Given 07/07/22 2130)  doxycycline (VIBRA-TABS) tablet 100 mg (100 mg Oral Given 07/07/22 2317)    ED Course/ Medical Decision Making/ A&P  Medical Decision Making  62 year old female presents to ED for evaluation.  Please see HPI for further details.  On examination the patient is afebrile and nontachycardic.  Lung sounds are clear bilaterally, she is not hypoxic.  Abdomen soft and  compressible however the patient does have tenderness about her umbilicus with slight erythema to laparoscopic surgical incision site, slight dehiscence however no obvious drainage, no induration.  Patient nontoxic in appearance.  CBC without leukocytosis, stable hemoglobin.  Patient lipase within normal limits.  Patient CMP without any electrolyte derangement.  Urinalysis unremarkable.  Patient CT abdomen pelvis shows mild fat stranding about the umbilicus without evidence of fluid collection or abscess.  The patient also was noted of a small hiatal hernia with mild thickening of her distal esophageal wall which may present esophagitis however the patient denies any epigastric abdominal pain, nausea or vomiting.  At this time, patient workup reassuring.  The patient was sent home by her general surgeon today with wound cleanser for umbilical wound.  I will add on doxycycline twice daily for 5 days due to purulent drainage.  Patient will be advised to follow-up with her general surgeon for further management.  The patient was given return precautions and she voiced understanding.  The patient was discharged in stable condition.   Final Clinical Impression(s) / ED Diagnoses Final diagnoses:  Post-op pain    Rx / DC Orders ED Discharge Orders          Ordered    doxycycline (VIBRAMYCIN) 100 MG capsule  2 times daily        07/07/22 2337              Lawana Chambers 07/07/22 2338    Blanchie Dessert, MD 07/08/22 [phone]

## 2022-09-29 ENCOUNTER — Other Ambulatory Visit: Payer: Self-pay | Admitting: Physician Assistant

## 2022-09-29 DIAGNOSIS — K439 Ventral hernia without obstruction or gangrene: Secondary | ICD-10-CM

## 2022-10-17 ENCOUNTER — Other Ambulatory Visit: Payer: Medicare HMO

## 2022-10-28 ENCOUNTER — Other Ambulatory Visit: Payer: Medicare HMO

## 2022-10-30 ENCOUNTER — Other Ambulatory Visit: Payer: Self-pay | Admitting: Geriatric Medicine

## 2022-10-30 ENCOUNTER — Other Ambulatory Visit: Payer: Self-pay | Admitting: Physician Assistant

## 2022-10-30 DIAGNOSIS — K439 Ventral hernia without obstruction or gangrene: Secondary | ICD-10-CM

## 2022-11-09 ENCOUNTER — Ambulatory Visit
Admission: RE | Admit: 2022-11-09 | Discharge: 2022-11-09 | Disposition: A | Discharge status: Home / Self Care | Payer: Medicare HMO | Source: Ambulatory Visit | Attending: Physician Assistant | Admitting: Physician Assistant

## 2022-11-09 DIAGNOSIS — K439 Ventral hernia without obstruction or gangrene: Secondary | ICD-10-CM

## 2022-11-09 MED ORDER — GADOPICLENOL 0.5 MMOL/ML IV SOLN
6.0000 mL | Freq: Once | INTRAVENOUS | Status: AC | PRN
  Administered 2022-11-09: 6 mL via INTRAVENOUS

## 2023-01-26 NOTE — Progress Notes (Signed)
Surgical Instructions   Your procedure is scheduled on Monday, 02/05/23. Report to [facility] Main Entrance "A" at 6:00 A.M., then check in with the Admitting office. Any questions or running late day of surgery: call [phone]  Questions prior to your surgery date: call [phone], Monday-Friday, 8am-4pm. If you experience any cold or flu symptoms such as cough, fever, chills, shortness of breath, etc. between now and your scheduled surgery, please notify us at the above number.     Remember:  Do not eat after midnight the night before your surgery   You may drink clear liquids until 5:00am the morning of your surgery.   Clear liquids allowed are: Water, Non-Citrus Juices (without pulp), Carbonated Beverages, Clear Tea, Black Coffee Only (NO MILK, CREAM OR POWDERED CREAMER of any kind), and Gatorade.    Take these medicines the morning of surgery with A SIP OF WATER  atenolol (TENORMIN)  DULoxetine (CYMBALTA)  famotidine (PEPCID)  fenofibrate (TRICOR)  pantoprazole (PROTONIX)  pregabalin (LYRICA)  sucralfate (CARAFATE)   May take these medicines IF NEEDED: nitroGLYCERIN (NITROSTAT)- please call if you need to use this Oxycodone HCl  tiZANidine (ZANAFLEX)   One week prior to surgery, STOP taking any Aspirin (unless otherwise instructed by your surgeon) Aleve, Naproxen, Ibuprofen, Motrin, Advil, Goody's, BC's, all herbal medications, fish oil, and non-prescription vitamins.  WHAT DO I DO ABOUT MY DIABETES MEDICATION?   Do not take oral diabetes medicines (pills) the morning of surgery.  Do not take Dulaglutide (TRULICITY) 7 days prior to surgery. Do not take a dose after 01/28/23.   Do not take Empagliflozin-metFORMIN HCl (SYNJARDY) 72 hours prior to surgery. Do not take a dose after 02/01/23.     The day of surgery, do not take other diabetes injectables, including Byetta (exenatide), Bydureon (exenatide ER), Victoza (liraglutide), or Trulicity (dulaglutide).  If your  CBG is greater than 220 mg/dL, you may take  of your sliding scale (correction) dose of insulin.   HOW TO MANAGE YOUR DIABETES BEFORE AND AFTER SURGERY  Why is it important to control my blood sugar before and after surgery? Improving blood sugar levels before and after surgery helps healing and can limit problems. A way of improving blood sugar control is eating a healthy diet by:  Eating less sugar and carbohydrates  Increasing activity/exercise  Talking with your doctor about reaching your blood sugar goals High blood sugars (greater than 180 mg/dL) can raise your risk of infections and slow your recovery, so you will need to focus on controlling your diabetes during the weeks before surgery. Make sure that the doctor who takes care of your diabetes knows about your planned surgery including the date and location.  How do I manage my blood sugar before surgery? Check your blood sugar at least 4 times a day, starting 2 days before surgery, to make sure that the level is not too high or low.  Check your blood sugar the morning of your surgery when you wake up and every 2 hours until you get to the Short Stay unit.  If your blood sugar is less than 70 mg/dL, you will need to treat for low blood sugar: Do not take insulin. Treat a low blood sugar (less than 70 mg/dL) with  cup of clear juice (cranberry or apple), 4 glucose tablets, OR glucose gel. Recheck blood sugar in 15 minutes after treatment (to make sure it is greater than 70 mg/dL). If your blood sugar is not greater than 70 mg/dL on recheck,  call [phone] for further instructions. Report your blood sugar to the short stay nurse when you get to Short Stay.  If you are admitted to the hospital after surgery: Your blood sugar will be checked by the staff and you will probably be given insulin after surgery (instead of oral diabetes medicines) to make sure you have good blood sugar levels. The goal for blood sugar control after  surgery is 80-180 mg/dL.                      Do NOT Smoke (Tobacco/Vaping) for 24 hours prior to your procedure.  If you use a CPAP at night, you may bring your mask/headgear for your overnight stay.   You will be asked to remove any contacts, glasses, piercing's, hearing aid's, dentures/partials prior to surgery. Please bring cases for these items if needed.    Patients discharged the day of surgery will not be allowed to drive home, and someone needs to stay with them for 24 hours.  SURGICAL WAITING ROOM VISITATION Patients may have no more than 2 support people in the waiting area - these visitors may rotate.   Pre-op nurse will coordinate an appropriate time for 1 ADULT support person, who may not rotate, to accompany patient in pre-op.  Children under the age of 28 must have an adult with them who is not the patient and must remain in the main waiting area with an adult.  If the patient needs to stay at the hospital during part of their recovery, the visitor guidelines for inpatient rooms apply.  Please refer to the [facility] website for the visitor guidelines for any additional information.   If you received a COVID test during your pre-op visit  it is requested that you wear a mask when out in public, stay away from anyone that may not be feeling well and notify your surgeon if you develop symptoms. If you have been in contact with anyone that has tested positive in the last 10 days please notify you surgeon.      Pre-operative CHG Bathing Instructions   You can play a key role in reducing the risk of infection after surgery. Your skin needs to be as free of germs as possible. You can reduce the number of germs on your skin by washing with CHG (chlorhexidine gluconate) soap before surgery. CHG is an antiseptic soap that kills germs and continues to kill germs even after washing.   DO NOT use if you have an allergy to chlorhexidine/CHG or antibacterial soaps. If your skin  becomes reddened or irritated, stop using the CHG and notify one of our RNs at [phone].              TAKE A SHOWER THE NIGHT BEFORE SURGERY AND THE DAY OF SURGERY    Please keep in mind the following:  DO NOT shave, including legs and underarms, 48 hours prior to surgery.   You may shave your face before/day of surgery.  Place clean sheets on your bed the night before surgery Use a clean washcloth (not used since being washed) for each shower. DO NOT sleep with pet's night before surgery.  CHG Shower Instructions:  Wash your face and private area with normal soap. If you choose to wash your hair, wash first with your normal shampoo.  After you use shampoo/soap, rinse your hair and body thoroughly to remove shampoo/soap residue.  Turn the water OFF and apply half the bottle of CHG  soap to a CLEAN washcloth.  Apply CHG soap ONLY FROM YOUR NECK DOWN TO YOUR TOES (washing for 3-5 minutes)  DO NOT use CHG soap on face, private areas, open wounds, or sores.  Pay special attention to the area where your surgery is being performed.  If you are having back surgery, having someone wash your back for you may be helpful. Wait 2 minutes after CHG soap is applied, then you may rinse off the CHG soap.  Pat dry with a clean towel  Put on clean pajamas    Additional instructions for the day of surgery: DO NOT APPLY any lotions, deodorants, cologne, or perfumes.   Do not wear jewelry or makeup Do not wear nail polish, gel polish, artificial nails, or any other type of covering on natural nails (fingers and toes) Do not bring valuables to the hospital. [facility] is not responsible for valuables/personal belongings. Put on clean/comfortable clothes.  Please brush your teeth.  Ask your nurse before applying any prescription medications to the skin.

## 2023-01-29 ENCOUNTER — Encounter (HOSPITAL_COMMUNITY): Payer: Self-pay

## 2023-01-29 ENCOUNTER — Other Ambulatory Visit: Payer: Self-pay | Admitting: General Surgery

## 2023-01-29 ENCOUNTER — Encounter (HOSPITAL_COMMUNITY)
Admission: RE | Admit: 2023-01-29 | Discharge: 2023-01-29 | Disposition: A | Discharge status: Home / Self Care | Payer: Medicare HMO | Source: Ambulatory Visit | Attending: General Surgery | Admitting: General Surgery

## 2023-01-29 ENCOUNTER — Other Ambulatory Visit: Payer: Self-pay

## 2023-01-29 VITALS — BP 118/58 | HR 59 | Temp 98.0°F | Resp 17 | Ht 61.0 in | Wt 137.0 lb

## 2023-01-29 DIAGNOSIS — Z951 Presence of aortocoronary bypass graft: Secondary | ICD-10-CM | POA: Insufficient documentation

## 2023-01-29 DIAGNOSIS — G4733 Obstructive sleep apnea (adult) (pediatric): Secondary | ICD-10-CM | POA: Insufficient documentation

## 2023-01-29 DIAGNOSIS — E119 Type 2 diabetes mellitus without complications: Secondary | ICD-10-CM | POA: Insufficient documentation

## 2023-01-29 DIAGNOSIS — F17210 Nicotine dependence, cigarettes, uncomplicated: Secondary | ICD-10-CM | POA: Diagnosis not present

## 2023-01-29 DIAGNOSIS — Z01812 Encounter for preprocedural laboratory examination: Secondary | ICD-10-CM | POA: Insufficient documentation

## 2023-01-29 DIAGNOSIS — I2510 Atherosclerotic heart disease of native coronary artery without angina pectoris: Secondary | ICD-10-CM | POA: Insufficient documentation

## 2023-01-29 HISTORY — DX: Personal history of other diseases of the digestive system: Z87.19

## 2023-01-29 LAB — CBC
HCT: 47.9 % — ABNORMAL HIGH (ref 36.0–46.0)
Hemoglobin: 14.9 g/dL (ref 12.0–15.0)
MCH: 26.8 pg (ref 26.0–34.0)
MCHC: 31.1 g/dL (ref 30.0–36.0)
MCV: 86.2 fL (ref 80.0–100.0)
Platelets: 227 10*3/uL (ref 150–400)
RBC: 5.56 MIL/uL — ABNORMAL HIGH (ref 3.87–5.11)
RDW: 14.6 % (ref 11.5–15.5)
WBC: 8.6 10*3/uL (ref 4.0–10.5)
nRBC: 0 % (ref 0.0–0.2)

## 2023-01-29 LAB — BASIC METABOLIC PANEL
Anion gap: 9 (ref 5–15)
BUN: 13 mg/dL (ref 8–23)
CO2: 23 mmol/L (ref 22–32)
Calcium: 8.8 mg/dL — ABNORMAL LOW (ref 8.9–10.3)
Chloride: 105 mmol/L (ref 98–111)
Creatinine, Ser: 0.77 mg/dL (ref 0.44–1.00)
GFR, Estimated: >60 mL/min (ref >60)
Glucose, Bld: 135 mg/dL — ABNORMAL HIGH (ref 70–99)
Potassium: 4.1 mmol/L (ref 3.5–5.1)
Sodium: 137 mmol/L (ref 135–145)

## 2023-01-29 LAB — GLUCOSE, CAPILLARY: Glucose-Capillary: 143 mg/dL — ABNORMAL HIGH (ref 70–99)

## 2023-01-29 NOTE — Progress Notes (Signed)
PCP - Courtney Paris, NP at [facility] Cardiologist - Dr. Rosita Kea at [facility] Endocrinologist - Benita Stabile, FNP  PPM/ICD - Denies Device Orders - n/a Rep Notified - n/a  Chest x-ray - Denies EKG - 05/31/2022 (in Epic) pt states she had recent one completed at cardiologist. Tracing requested. Stress Test - 01/03/2013 ECHO - 05/03/2011 Cardiac Cath - 2008 with OHS.  Sleep Study - +OSA. Pt does not wear CPAP. Unable to tolerate.  Pt is DM2. She checks her blood sugar daily. Normal fasting range is 120s. CBG at pre-op appointment 143. Last A1c 6.9 on 12/13/2022 (CE)  Last dose of GLP1 agonist-  Last dose of Trulicity 9/28.  GLP1 instructions: Pt will not take her next dose on Saturday, 10/8  Blood Thinner Instructions: n/a Aspirin Instructions: Pt instructed to contact surgeon's office to receive ASA instructions   ERAS Protcol - Clear liquids until 0500 morning of surgery PRE-SURGERY Ensure or G2- n/a  COVID TEST- n/a   Anesthesia review: Yes. Cardiac Hx including M!, OHS, HTN and DM. Cardiac records requested.   Patient denies shortness of breath, fever, cough and chest pain at PAT appointment. Pt denies any respiratory illness/infection in the last two months.   All instructions explained to the patient, with a verbal understanding of the material. Patient agrees to go over the instructions while at home for a better understanding. Patient also instructed to self quarantine after being tested for COVID-19. The opportunity to ask questions was provided.

## 2023-01-30 NOTE — Progress Notes (Signed)
Anesthesia Chart Review:  Patient follows with cardiologist Dr. Mercy Riding at [facility] for history of CAD status post CABG x 3 in 2008.  Echo 10/20/2022 showed EF 55 to 60%, grade 1 DD, no significant valvular abnormalities. Per last OV note from 10/27/22, pt maintains an active lifestyle and is easily able to achieve > 5 METs.    Follows with endocrinology at [facility] for history of multinodular goiter s/p RAI and non-insulin-dependent DM2.  A1c 6.9 on 12/03/2022.  Pt reports LD of Trulicity 01/27/23.   Patient is a current everyday smoker. Spirometry 03/24/17 done by PCP was nromal.  OSA, intolerant to CPAP.    Preop labs reviewed, unremarkable.   EKG 01/29/2023 (copy on chart): Sinus bradycardia.  Rate 56.  Old anterior infarct.  No new changes from 2023.   TTE 10/20/2022 [facility]): Conclusions: 1.  LV cavity size, wall thickness and systolic function are normal. 2.  Overall left ventricular systolic function is normal; LVEF 55 to 60%. 3.  Trace to mild aortic regurgitation. 4.  Grade 1 diastolic dysfunction; filling pattern indicates impaired relaxation. 5.  There is trace mitral regurgitation. 6.  The left atrium is markedly dilated; LA volume 50 mL/m. 7.  The right ventricle is normal in size and function. 8.  There is no evidence of pulmonary hypertension.  RVSP estimated at 25 to 30 mmHg. 9.  The inferior vena cava is normal, consistent with normal RA pressure.     Zannie Cove [facility] Short Stay Center/Anesthesiology Phone [phone] 01/30/2023 3:45 PM

## 2023-01-30 NOTE — Anesthesia Preprocedure Evaluation (Addendum)
Anesthesia Evaluation  Patient identified by MRN, date of birth, ID band Patient awake    Reviewed: Allergy & Precautions, H&P , NPO status , Patient's Chart, lab work & pertinent test results  Airway Mallampati: II   Neck ROM: full    Dental   Pulmonary sleep apnea , Current Smoker   breath sounds clear to auscultation       Cardiovascular hypertension, + CAD, + Past MI and + CABG   Rhythm:regular Rate:Normal     Neuro/Psych  PSYCHIATRIC DISORDERS Anxiety Depression Bipolar Disorder      GI/Hepatic hiatal hernia,GERD  ,,  Endo/Other  diabetes, Type 2Hypothyroidism    Renal/GU      Musculoskeletal  (+) Arthritis ,    Abdominal   Peds  Hematology   Anesthesia Other Findings   Reproductive/Obstetrics                             Anesthesia Physical Anesthesia Plan  ASA: 3  Anesthesia Plan: General   Post-op Pain Management: Regional block*   Induction: Intravenous  PONV Risk Score and Plan: 2 and Ondansetron, Dexamethasone, Midazolam and Treatment may vary due to age or medical condition  Airway Management Planned: Oral ETT  Additional Equipment:   Intra-op Plan:   Post-operative Plan: Extubation in OR  Informed Consent: I have reviewed the patients History and Physical, chart, labs and discussed the procedure including the risks, benefits and alternatives for the proposed anesthesia with the patient or authorized representative who has indicated his/her understanding and acceptance.     Dental advisory given  Plan Discussed with: CRNA, Anesthesiologist and Surgeon  Anesthesia Plan Comments: (PAT note by Antionette Poles, PA-C: Patient follows with cardiologist Dr. Mercy Riding at [facility] for history of CAD status post CABGx 3 in 2008.  Echo 10/20/2022 showed EF 55 to 60%, grade 1 DD, no significant valvular abnormalities. Per last OV note from 10/27/22, pt maintains an  active lifestyle and is easily able to achieve > 5 METs.   Follows with endocrinology at [facility] for history of multinodular goiter s/p RAI and non-insulin-dependent DM2.  A1c 6.9 on 12/03/2022.  Pt reports LD of Trulicity 01/27/23.  Patient is a current everyday smoker.Spirometry 03/24/17 done by PCP was nromal.  OSA, intolerant to CPAP.   Preop labs reviewed, unremarkable.  EKG 01/29/2023 (copy on chart): Sinus bradycardia.  Rate 56.  Old anterior infarct.  No new changes from 2023.  TTE 10/20/2022 [facility]): Conclusions: 1.  LV cavity size, wall thickness and systolic function are normal. 2.  Overall left ventricular systolic function is normal; LVEF 55 to 60%. 3.  Trace to mild aortic regurgitation. 4.  Grade 1 diastolic dysfunction; filling pattern indicates impaired relaxation. 5.  There is trace mitral regurgitation. 6.  The left atrium is markedly dilated; LA volume 50 mL/m. 7.  The right ventricle is normal in size and function. 8.  There is no evidence of pulmonary hypertension.  RVSP estimated at 25 to 30 mmHg. 9.  The inferior vena cava is normal, consistent with normal RA pressure.   )        Anesthesia Quick Evaluation

## 2023-02-05 ENCOUNTER — Ambulatory Visit (HOSPITAL_COMMUNITY): Payer: Medicare HMO | Admitting: Physician Assistant

## 2023-02-05 ENCOUNTER — Encounter (HOSPITAL_COMMUNITY)
Admission: RE | Disposition: A | Discharge status: Home / Self Care | Payer: Self-pay | Source: Home / Self Care | Attending: General Surgery

## 2023-02-05 ENCOUNTER — Other Ambulatory Visit: Payer: Self-pay

## 2023-02-05 ENCOUNTER — Ambulatory Visit (HOSPITAL_BASED_OUTPATIENT_CLINIC_OR_DEPARTMENT_OTHER): Payer: Medicare HMO | Admitting: Anesthesiology

## 2023-02-05 ENCOUNTER — Encounter (HOSPITAL_COMMUNITY): Payer: Self-pay | Admitting: General Surgery

## 2023-02-05 ENCOUNTER — Observation Stay (HOSPITAL_COMMUNITY)
Admission: RE | Admit: 2023-02-05 | Discharge: 2023-02-06 | Disposition: A | Discharge status: Home / Self Care | Payer: Medicare HMO | Attending: General Surgery | Admitting: General Surgery

## 2023-02-05 DIAGNOSIS — K432 Incisional hernia without obstruction or gangrene: Secondary | ICD-10-CM

## 2023-02-05 DIAGNOSIS — K439 Ventral hernia without obstruction or gangrene: Secondary | ICD-10-CM | POA: Diagnosis not present

## 2023-02-05 DIAGNOSIS — K429 Umbilical hernia without obstruction or gangrene: Secondary | ICD-10-CM | POA: Insufficient documentation

## 2023-02-05 DIAGNOSIS — I2510 Atherosclerotic heart disease of native coronary artery without angina pectoris: Secondary | ICD-10-CM | POA: Insufficient documentation

## 2023-02-05 DIAGNOSIS — Z7982 Long term (current) use of aspirin: Secondary | ICD-10-CM | POA: Diagnosis not present

## 2023-02-05 DIAGNOSIS — E119 Type 2 diabetes mellitus without complications: Secondary | ICD-10-CM | POA: Diagnosis not present

## 2023-02-05 DIAGNOSIS — I10 Essential (primary) hypertension: Secondary | ICD-10-CM | POA: Insufficient documentation

## 2023-02-05 DIAGNOSIS — E039 Hypothyroidism, unspecified: Secondary | ICD-10-CM | POA: Diagnosis not present

## 2023-02-05 DIAGNOSIS — Z79899 Other long term (current) drug therapy: Secondary | ICD-10-CM | POA: Insufficient documentation

## 2023-02-05 DIAGNOSIS — F17210 Nicotine dependence, cigarettes, uncomplicated: Secondary | ICD-10-CM | POA: Insufficient documentation

## 2023-02-05 HISTORY — PX: INCISIONAL HERNIA REPAIR: SHX193

## 2023-02-05 LAB — GLUCOSE, CAPILLARY
Glucose-Capillary: 149 mg/dL — ABNORMAL HIGH (ref 70–99)
Glucose-Capillary: 160 mg/dL — ABNORMAL HIGH (ref 70–99)
Glucose-Capillary: 160 mg/dL — ABNORMAL HIGH (ref 70–99)
Glucose-Capillary: 275 mg/dL — ABNORMAL HIGH (ref 70–99)
Glucose-Capillary: 124 mg/dL — ABNORMAL HIGH (ref 70–99)

## 2023-02-05 SURGERY — REPAIR, HERNIA, INCISIONAL, LAPAROSCOPIC
Anesthesia: Regional

## 2023-02-05 MED ORDER — EMPAGLIFLOZIN-METFORMIN HCL 12.5-1000 MG PO TABS: 1.0000 | ORAL_TABLET | Freq: Every day | ORAL | Status: DC

## 2023-02-05 MED ORDER — ROCURONIUM BROMIDE 10 MG/ML (PF) SYRINGE
PREFILLED_SYRINGE | INTRAVENOUS | Status: DC | PRN
  Administered 2023-02-05: 20 mg via INTRAVENOUS
  Administered 2023-02-05: 50 mg via INTRAVENOUS

## 2023-02-05 MED ORDER — DEXAMETHASONE SODIUM PHOSPHATE 10 MG/ML IJ SOLN
INTRAMUSCULAR | Status: AC
  Filled 2023-02-05: qty 1

## 2023-02-05 MED ORDER — MIDAZOLAM HCL 2 MG/2ML IJ SOLN
INTRAMUSCULAR | Status: DC | PRN
  Administered 2023-02-05: 2 mg via INTRAVENOUS

## 2023-02-05 MED ORDER — BUPIVACAINE-EPINEPHRINE (PF) 0.25% -1:200000 IJ SOLN
INTRAMUSCULAR | Status: AC
  Filled 2023-02-05: qty 30

## 2023-02-05 MED ORDER — ONDANSETRON HCL 4 MG/2ML IJ SOLN: 4.0000 mg | Freq: Four times a day (QID) | INTRAMUSCULAR | Status: DC | PRN

## 2023-02-05 MED ORDER — CEFAZOLIN SODIUM-DEXTROSE 2-4 GM/100ML-% IV SOLN
2.0000 g | INTRAVENOUS | Status: AC
  Administered 2023-02-05: 2 g via INTRAVENOUS
  Filled 2023-02-05: qty 100

## 2023-02-05 MED ORDER — CHLORHEXIDINE GLUCONATE CLOTH 2 % EX PADS: 6.0000 | MEDICATED_PAD | Freq: Once | CUTANEOUS | Status: DC

## 2023-02-05 MED ORDER — POLYETHYLENE GLYCOL 3350 17 G PO PACK
17.0000 g | PACK | Freq: Every morning | ORAL | Status: DC
  Filled 2023-02-05 (×2): qty 1

## 2023-02-05 MED ORDER — FENTANYL CITRATE (PF) 250 MCG/5ML IJ SOLN
INTRAMUSCULAR | Status: AC
  Filled 2023-02-05: qty 5

## 2023-02-05 MED ORDER — FENTANYL CITRATE (PF) 100 MCG/2ML IJ SOLN
25.0000 ug | INTRAMUSCULAR | Status: DC | PRN
  Administered 2023-02-05 (×2): 25 ug via INTRAVENOUS
  Administered 2023-02-05: 50 ug via INTRAVENOUS

## 2023-02-05 MED ORDER — PANTOPRAZOLE SODIUM 40 MG PO TBEC
40.0000 mg | DELAYED_RELEASE_TABLET | Freq: Every day | ORAL | Status: DC
  Administered 2023-02-06: 40 mg via ORAL
  Filled 2023-02-05 (×2): qty 1

## 2023-02-05 MED ORDER — LEVOTHYROXINE SODIUM 75 MCG PO TABS
125.0000 ug | ORAL_TABLET | Freq: Every day | ORAL | Status: DC
  Administered 2023-02-05 – 2023-02-06 (×2): 125 ug via ORAL
  Filled 2023-02-05 (×2): qty 1

## 2023-02-05 MED ORDER — PROPOFOL 10 MG/ML IV BOLUS
INTRAVENOUS | Status: DC | PRN
  Administered 2023-02-05: 140 mg via INTRAVENOUS

## 2023-02-05 MED ORDER — ENOXAPARIN SODIUM 40 MG/0.4ML IJ SOSY
40.0000 mg | PREFILLED_SYRINGE | INTRAMUSCULAR | Status: DC
  Administered 2023-02-05: 40 mg via SUBCUTANEOUS
  Filled 2023-02-05: qty 0.4

## 2023-02-05 MED ORDER — ORAL CARE MOUTH RINSE: 15.0000 mL | Freq: Once | OROMUCOSAL | Status: AC

## 2023-02-05 MED ORDER — PHENYLEPHRINE 80 MCG/ML (10ML) SYRINGE FOR IV PUSH (FOR BLOOD PRESSURE SUPPORT)
PREFILLED_SYRINGE | INTRAVENOUS | Status: DC | PRN
  Administered 2023-02-05: 160 ug via INTRAVENOUS
  Administered 2023-02-05: 80 ug via INTRAVENOUS

## 2023-02-05 MED ORDER — DULOXETINE HCL 60 MG PO CPEP
60.0000 mg | ORAL_CAPSULE | Freq: Two times a day (BID) | ORAL | Status: DC
  Administered 2023-02-05 – 2023-02-06 (×2): 60 mg via ORAL
  Filled 2023-02-05 (×2): qty 1

## 2023-02-05 MED ORDER — LACTATED RINGERS IV SOLN: INTRAVENOUS | Status: DC

## 2023-02-05 MED ORDER — SUGAMMADEX SODIUM 200 MG/2ML IV SOLN
INTRAVENOUS | Status: DC | PRN
  Administered 2023-02-05: 200 mg via INTRAVENOUS

## 2023-02-05 MED ORDER — SUCRALFATE 1 G PO TABS
1.0000 g | ORAL_TABLET | Freq: Three times a day (TID) | ORAL | Status: DC
  Administered 2023-02-05 – 2023-02-06 (×3): 1 g via ORAL
  Filled 2023-02-05 (×3): qty 1

## 2023-02-05 MED ORDER — LIDOCAINE 2% (20 MG/ML) 5 ML SYRINGE
INTRAMUSCULAR | Status: DC | PRN
  Administered 2023-02-05: 40 mg via INTRAVENOUS

## 2023-02-05 MED ORDER — OXYCODONE HCL 5 MG/5ML PO SOLN
5.0000 mg | Freq: Once | ORAL | Status: AC | PRN
  Administered 2023-02-05: 5 mg via ORAL

## 2023-02-05 MED ORDER — FENTANYL CITRATE (PF) 100 MCG/2ML IJ SOLN
INTRAMUSCULAR | Status: AC
  Filled 2023-02-05: qty 2

## 2023-02-05 MED ORDER — ONDANSETRON 4 MG PO TBDP: 4.0000 mg | ORAL_TABLET | Freq: Four times a day (QID) | ORAL | Status: DC | PRN

## 2023-02-05 MED ORDER — FAMOTIDINE 20 MG PO TABS
20.0000 mg | ORAL_TABLET | Freq: Two times a day (BID) | ORAL | Status: DC
  Administered 2023-02-05 – 2023-02-06 (×2): 20 mg via ORAL
  Filled 2023-02-05 (×3): qty 1

## 2023-02-05 MED ORDER — PHENYLEPHRINE 80 MCG/ML (10ML) SYRINGE FOR IV PUSH (FOR BLOOD PRESSURE SUPPORT)
PREFILLED_SYRINGE | INTRAVENOUS | Status: AC
  Filled 2023-02-05: qty 10

## 2023-02-05 MED ORDER — SIMETHICONE 80 MG PO CHEW: 40.0000 mg | CHEWABLE_TABLET | Freq: Four times a day (QID) | ORAL | Status: DC | PRN

## 2023-02-05 MED ORDER — BUPIVACAINE-EPINEPHRINE 0.25% -1:200000 IJ SOLN
INTRAMUSCULAR | Status: DC | PRN
  Administered 2023-02-05: 5 mL

## 2023-02-05 MED ORDER — EPHEDRINE SULFATE-NACL 50-0.9 MG/10ML-% IV SOSY
PREFILLED_SYRINGE | INTRAVENOUS | Status: DC | PRN
  Administered 2023-02-05: 10 mg via INTRAVENOUS
  Administered 2023-02-05: 5 mg via INTRAVENOUS
  Administered 2023-02-05: 10 mg via INTRAVENOUS

## 2023-02-05 MED ORDER — ROSUVASTATIN CALCIUM 20 MG PO TABS
40.0000 mg | ORAL_TABLET | Freq: Every day | ORAL | Status: DC
  Administered 2023-02-05: 40 mg via ORAL
  Filled 2023-02-05: qty 2

## 2023-02-05 MED ORDER — ACETAMINOPHEN 500 MG PO TABS
1000.0000 mg | ORAL_TABLET | Freq: Four times a day (QID) | ORAL | Status: DC
  Administered 2023-02-05 – 2023-02-06 (×4): 1000 mg via ORAL
  Filled 2023-02-05 (×5): qty 2

## 2023-02-05 MED ORDER — OXYCODONE HCL 5 MG PO TABS
10.0000 mg | ORAL_TABLET | ORAL | Status: DC | PRN
  Administered 2023-02-05 – 2023-02-06 (×3): 10 mg via ORAL
  Filled 2023-02-05 (×3): qty 2

## 2023-02-05 MED ORDER — INSULIN ASPART 100 UNIT/ML IJ SOLN
0.0000 [IU] | INTRAMUSCULAR | Status: DC | PRN
  Administered 2023-02-05: 2 [IU] via SUBCUTANEOUS
  Filled 2023-02-05: qty 1

## 2023-02-05 MED ORDER — ROCURONIUM BROMIDE 10 MG/ML (PF) SYRINGE
PREFILLED_SYRINGE | INTRAVENOUS | Status: AC
  Filled 2023-02-05: qty 10

## 2023-02-05 MED ORDER — LIDOCAINE 2% (20 MG/ML) 5 ML SYRINGE
INTRAMUSCULAR | Status: AC
  Filled 2023-02-05: qty 5

## 2023-02-05 MED ORDER — HYDROMORPHONE HCL 1 MG/ML IJ SOLN: 0.5000 mg | INTRAMUSCULAR | Status: DC | PRN

## 2023-02-05 MED ORDER — EMPAGLIFLOZIN 10 MG PO TABS
10.0000 mg | ORAL_TABLET | Freq: Every day | ORAL | Status: DC
  Administered 2023-02-06: 10 mg via ORAL
  Filled 2023-02-05: qty 1

## 2023-02-05 MED ORDER — FENTANYL CITRATE (PF) 250 MCG/5ML IJ SOLN
INTRAMUSCULAR | Status: DC | PRN
  Administered 2023-02-05 (×3): 50 ug via INTRAVENOUS

## 2023-02-05 MED ORDER — INSULIN ASPART 100 UNIT/ML IJ SOLN
0.0000 [IU] | Freq: Three times a day (TID) | INTRAMUSCULAR | Status: DC
  Administered 2023-02-05: 8 [IU] via SUBCUTANEOUS

## 2023-02-05 MED ORDER — MIDAZOLAM HCL 2 MG/2ML IJ SOLN
INTRAMUSCULAR | Status: AC
  Filled 2023-02-05: qty 2

## 2023-02-05 MED ORDER — ROPIVACAINE HCL 5 MG/ML IJ SOLN
INTRAMUSCULAR | Status: DC | PRN
Start: 2023-02-05 — End: 2023-02-05
  Administered 2023-02-05 (×2): 20 mL via PERINEURAL

## 2023-02-05 MED ORDER — ACETAMINOPHEN 500 MG PO TABS
1000.0000 mg | ORAL_TABLET | ORAL | Status: AC
  Administered 2023-02-05: 1000 mg via ORAL
  Filled 2023-02-05: qty 2

## 2023-02-05 MED ORDER — ENSURE PRE-SURGERY PO LIQD: 296.0000 mL | Freq: Once | ORAL | Status: DC

## 2023-02-05 MED ORDER — ONDANSETRON HCL 4 MG/2ML IJ SOLN
INTRAMUSCULAR | Status: AC
  Filled 2023-02-05: qty 2

## 2023-02-05 MED ORDER — CHLORHEXIDINE GLUCONATE 0.12 % MT SOLN
15.0000 mL | Freq: Once | OROMUCOSAL | Status: AC
  Administered 2023-02-05: 15 mL via OROMUCOSAL
  Filled 2023-02-05: qty 15

## 2023-02-05 MED ORDER — TIZANIDINE HCL 4 MG PO TABS: 4.0000 mg | ORAL_TABLET | Freq: Three times a day (TID) | ORAL | Status: DC | PRN

## 2023-02-05 MED ORDER — PROPOFOL 10 MG/ML IV BOLUS
INTRAVENOUS | Status: AC
  Filled 2023-02-05: qty 20

## 2023-02-05 MED ORDER — OXYCODONE HCL 5 MG PO TABS: 5.0000 mg | ORAL_TABLET | Freq: Once | ORAL | Status: AC | PRN

## 2023-02-05 MED ORDER — METFORMIN HCL 500 MG PO TABS
1000.0000 mg | ORAL_TABLET | Freq: Every day | ORAL | Status: DC
  Administered 2023-02-06: 1000 mg via ORAL
  Filled 2023-02-05 (×2): qty 2

## 2023-02-05 MED ORDER — SODIUM CHLORIDE 0.9 % IV SOLN: INTRAVENOUS | Status: DC

## 2023-02-05 MED ORDER — OXYCODONE HCL 5 MG/5ML PO SOLN
ORAL | Status: AC
  Filled 2023-02-05: qty 5

## 2023-02-05 MED ORDER — ATENOLOL 25 MG PO TABS
25.0000 mg | ORAL_TABLET | Freq: Every day | ORAL | Status: DC
  Administered 2023-02-06: 25 mg via ORAL
  Filled 2023-02-05 (×2): qty 1

## 2023-02-05 MED ORDER — PREGABALIN 100 MG PO CAPS
100.0000 mg | ORAL_CAPSULE | Freq: Three times a day (TID) | ORAL | Status: DC
  Administered 2023-02-05 – 2023-02-06 (×3): 100 mg via ORAL
  Filled 2023-02-05 (×3): qty 1

## 2023-02-05 MED ORDER — DEXAMETHASONE SODIUM PHOSPHATE 10 MG/ML IJ SOLN
INTRAMUSCULAR | Status: DC | PRN
  Administered 2023-02-05: 5 mg via INTRAVENOUS

## 2023-02-05 MED ORDER — EPHEDRINE 5 MG/ML INJ
INTRAVENOUS | Status: AC
  Filled 2023-02-05: qty 5

## 2023-02-05 MED ORDER — ONDANSETRON HCL 4 MG/2ML IJ SOLN
INTRAMUSCULAR | Status: DC | PRN
  Administered 2023-02-05: 4 mg via INTRAVENOUS

## 2023-02-05 MED ORDER — LEVOTHYROXINE SODIUM 125 MCG PO TABS
125.0000 ug | ORAL_TABLET | Freq: Every day | ORAL | Status: DC
  Filled 2023-02-05 (×2): qty 1

## 2023-02-05 SURGICAL SUPPLY — 67 items
ADH SKN CLS APL DERMABOND .7 (GAUZE/BANDAGES/DRESSINGS) ×1
APL PRP STRL LF DISP 70% ISPRP (MISCELLANEOUS) ×1
APL SKNCLS STERI-STRIP NONHPOA (GAUZE/BANDAGES/DRESSINGS) ×1
APPLIER CLIP LOGIC TI 5 (MISCELLANEOUS) IMPLANT
APPLIER CLIP ROT 10 11.4 M/L (STAPLE)
APR CLP MED LRG 11.4X10 (STAPLE)
APR CLP MED LRG 33X5 (MISCELLANEOUS)
BAG COUNTER SPONGE SURGICOUNT (BAG) ×1 IMPLANT
BAG SPNG CNTER NS LX DISP (BAG) ×1
BENZOIN TINCTURE PRP APPL 2/3 (GAUZE/BANDAGES/DRESSINGS) IMPLANT
BNDG GAUZE DERMACEA FLUFF 4 (GAUZE/BANDAGES/DRESSINGS) IMPLANT
BNDG GZE DERMACEA 4 6PLY (GAUZE/BANDAGES/DRESSINGS)
CANISTER SUCT 3000ML PPV (MISCELLANEOUS) IMPLANT
CHLORAPREP W/TINT 26 (MISCELLANEOUS) ×1 IMPLANT
CLIP APPLIE ROT 10 11.4 M/L (STAPLE) IMPLANT
COVER SURGICAL LIGHT HANDLE (MISCELLANEOUS) ×1 IMPLANT
DERMABOND ADVANCED .7 DNX12 (GAUZE/BANDAGES/DRESSINGS) ×1 IMPLANT
DEVICE RELIATACK FIXATION (MISCELLANEOUS) IMPLANT
DEVICE SECURE STRAP 25 ABSORB (INSTRUMENTS) IMPLANT
DEVICE TROCAR PUNCTURE CLOSURE (ENDOMECHANICALS) ×1 IMPLANT
DRAPE INCISE IOBAN 66X45 STRL (DRAPES) ×1 IMPLANT
DRSG TEGADERM 2-3/8X2-3/4 SM (GAUZE/BANDAGES/DRESSINGS) IMPLANT
DRSG TEGADERM 4X4.75 (GAUZE/BANDAGES/DRESSINGS) IMPLANT
ELECT CAUTERY BLADE 6.4 (BLADE) IMPLANT
ELECT REM PT RETURN 9FT ADLT (ELECTROSURGICAL) ×1
ELECTRODE REM PT RTRN 9FT ADLT (ELECTROSURGICAL) ×1 IMPLANT
GLOVE BIO SURGEON STRL SZ7 (GLOVE) ×1 IMPLANT
GLOVE BIOGEL PI IND STRL 7.5 (GLOVE) ×1 IMPLANT
GOWN STRL REUS W/ TWL LRG LVL3 (GOWN DISPOSABLE) ×3 IMPLANT
GOWN STRL REUS W/TWL LRG LVL3 (GOWN DISPOSABLE) ×3
IRRIG SUCT STRYKERFLOW 2 WTIP (MISCELLANEOUS)
IRRIGATION SUCT STRKRFLW 2 WTP (MISCELLANEOUS) IMPLANT
KIT BASIN OR (CUSTOM PROCEDURE TRAY) ×1 IMPLANT
KIT TURNOVER KIT B (KITS) ×1 IMPLANT
MARKER SKIN DUAL TIP RULER LAB (MISCELLANEOUS) ×1 IMPLANT
MESH VENTRALEX ST 8CM LRG (Mesh General) IMPLANT
NDL SPNL 22GX3.5 QUINCKE BK (NEEDLE) ×1 IMPLANT
NEEDLE SPNL 22GX3.5 QUINCKE BK (NEEDLE) ×1
NS IRRIG 1000ML POUR BTL (IV SOLUTION) ×1 IMPLANT
PAD ARMBOARD 7.5X6 YLW CONV (MISCELLANEOUS) ×2 IMPLANT
PENCIL BUTTON HOLSTER BLD 10FT (ELECTRODE) IMPLANT
RELOAD ENDO RELIATCK 10 HERNIA (MISCELLANEOUS) IMPLANT
RELOAD ENDO RELIATCK 5 HERNIA (MISCELLANEOUS) IMPLANT
RELOAD RELIATACK 10 (MISCELLANEOUS)
RELOAD RELIATACK 5 (MISCELLANEOUS)
SCISSORS LAP 5X35 DISP (ENDOMECHANICALS) IMPLANT
SET TUBE SMOKE EVAC HIGH FLOW (TUBING) ×1 IMPLANT
SHEARS HARMONIC ACE PLUS 36CM (ENDOMECHANICALS) IMPLANT
SLEEVE Z-THREAD 5X100MM (TROCAR) ×1 IMPLANT
STRIP CLOSURE SKIN 1/2X4 (GAUZE/BANDAGES/DRESSINGS) ×1 IMPLANT
SUT MNCRL AB 4-0 PS2 18 (SUTURE) ×1 IMPLANT
SUT MON AB 4-0 PC3 18 (SUTURE) IMPLANT
SUT NOVA NAB DX-16 0-1 5-0 T12 (SUTURE) IMPLANT
SUT PROLENE 0 CT 1 CR/8 (SUTURE) ×1 IMPLANT
SUT VIC AB 0 CT1 27 (SUTURE) ×2
SUT VIC AB 0 CT1 27XBRD ANBCTR (SUTURE) ×2 IMPLANT
SUT VIC AB 3-0 SH 27 (SUTURE) ×2
SUT VIC AB 3-0 SH 27X BRD (SUTURE) IMPLANT
TOWEL GREEN STERILE (TOWEL DISPOSABLE) ×1 IMPLANT
TOWEL GREEN STERILE FF (TOWEL DISPOSABLE) ×1 IMPLANT
TRAY FOLEY W/BAG SLVR 14FR (SET/KITS/TRAYS/PACK) IMPLANT
TRAY LAPAROSCOPIC MC (CUSTOM PROCEDURE TRAY) ×1 IMPLANT
TROCAR 11X100 Z THREAD (TROCAR) ×1 IMPLANT
TROCAR BALLN 12MMX100 BLUNT (TROCAR) IMPLANT
TROCAR Z-THREAD OPTICAL 5X100M (TROCAR) ×1 IMPLANT
WARMER LAPAROSCOPE (MISCELLANEOUS) ×1 IMPLANT
WATER STERILE IRR 1000ML POUR (IV SOLUTION) ×1 IMPLANT

## 2023-02-05 NOTE — H&P (Signed)
62 year old female has got a history of a laparoscopic cholecystectomy as well as a coronary bypass in 2008. She notes a mass in her epigastric region as well as at her umbilicus. These both reduce when she is lying down. Is difficult for her to wear a bra or pants because they both sit at the site of the hernias. She has no issues with her bowel movements. She has no nausea or vomiting. She has undergone evaluation with a CT scan in March which shows some fat stranding the around the umbilicus but I think there is an umbilical hernia there. She also has an epigastric hernia which I can see on that exam as well. She then underwent an MRI last month that shows an epigastric hernia with the defect measured about 2 and half centimeters, and umbilical hernia with a defect measuring a couple centimeters as well as a question of another midline ventral hernia between those 2. There is no bowel involvement. She comes in today to get an opinion on repair.  Review of Systems: A complete review of systems was obtained from the patient. I have reviewed this information and discussed as appropriate with the patient. See HPI as well for other ROS.  Review of Systems  Neurological: Positive for weakness (right side).  Psychiatric/Behavioral: Positive for depression. The patient is nervous/anxious.  All other systems reviewed and are negative.   Medical History: Past Medical History:  Diagnosis Date  Anxiety  Arthritis  Diabetes mellitus without complication (CMS/HHS-HCC)  GERD (gastroesophageal reflux disease)  Heart valve disease  High cholesterol  Hypertension  Sleep apnea  Thyroid disease    Past Surgical History:  Procedure Laterality Date  triple bypass 2008  Back surgery  Shoulder surgery   Allergies  Allergen Reactions  Lurasidone Hcl Shortness Of Breath  Prednisone Rash and Swelling  Coconut Rash  Cyclobenzaprine Other (See Comments)  "almost made heart stop, couldn't  breathe" Morphine Vomiting and Nausea And Vomiting  Meloxicam Itching  Other Rash  Surgical glue   Current Outpatient Medications on File Prior to Visit  Medication Sig Dispense Refill  ACCU-CHEK GUIDE TEST STRIPS test strip TEST BLOOD GLUCOSE ONE TIME DAILY  acetaminophen (TYLENOL) 500 MG tablet Take by mouth every 6 (six) hours  albuterol MDI, PROVENTIL, VENTOLIN, PROAIR, HFA 90 mcg/actuation inhaler  atenoloL (TENORMIN) 25 MG tablet Take 25 mg by mouth once daily  blood glucose diagnostic (ACCU-CHEK GUIDE TEST STRIPS) test strip TEST BLOOD GLUCOSE ONE TIME DAILY  blood glucose diagnostic (GLUCOSE BLOOD) test strip Use as directed to check blood sugar daily  blood glucose meter kit Use as instructed to test blood sugar  diazePAM (VALIUM) 5 MG tablet Take 5 mg by mouth at bedtime  diphenhydrAMINE (BENADRYL) 25 mg tablet Take 25 mg by mouth every 6 (six) hours as needed  DULoxetine (CYMBALTA) 60 MG DR capsule Take 60 mg by mouth 2 (two) times daily  ergocalciferol, vitamin D2, 1,250 mcg (50,000 unit) capsule Take 50,000 Units by mouth every 7 (seven) days  famotidine (PEPCID) 20 MG tablet  fluticasone propionate (FLONASE) 50 mcg/actuation nasal spray Place 2 sprays into both nostrils at bedtime  levothyroxine (SYNTHROID) 100 MCG tablet Take 100 mcg by mouth once daily  loratadine (CLARITIN) 10 mg tablet 1 tablet Orally Once a day for 30 day(s)  naloxone (NARCAN) 4 mg/actuation nasal spray Place 4 mg into one nostril once  omeprazole (PRILOSEC) 40 MG DR capsule  oxyCODONE (OXYIR) 10 mg immediate release tablet Take 10 mg by  mouth every 4 (four) hours as needed  pregabalin (LYRICA) 100 MG capsule Take 100 mg by mouth 3 (three) times daily  rosuvastatin (CRESTOR) 40 MG tablet Take 40 mg by mouth once daily  sucralfate (CARAFATE) 1 gram tablet  tiZANidine (ZANAFLEX) 4 MG tablet Take 4 mg by mouth  triamcinolone 0.1 % cream  aspirin 81 MG EC tablet Take by mouth 2 (two) times daily (Patient  not taking: Reported on 12/08/2022)  azithromycin (ZITHROMAX) 250 MG tablet (Patient not taking: Reported on 12/08/2022)  blood glucose diagnostic (GLUCOSE BLOOD) test strip Use as directed to check blood sugar daily (Patient not taking: Reported on 12/08/2022)  blood glucose meter kit Use as instructed to test blood sugar (Patient not taking: Reported on 12/08/2022)  buPROPion (WELLBUTRIN SR) 150 MG SR tablet Take 150 mg by mouth once daily (Patient not taking: Reported on 12/08/2022)  cyanocobalamin, vitamin B-12, (B-12 COMPLIANCE) 1,000 mcg/mL Kit Inject as directed as directed (Patient not taking: Reported on 12/08/2022)  dicyclomine (BENTYL) 20 mg tablet Take 20 mg by mouth every 6 (six) hours (Patient not taking: Reported on 12/08/2022)  DOCOSAHEXAENOIC ACID ORAL Take by mouth (Patient not taking: Reported on 12/08/2022)  doxycycline (VIBRAMYCIN) 100 MG capsule Take 100 mg by mouth 2 (two) times daily (Patient not taking: Reported on 12/08/2022)  dulaglutide (TRULICITY) 0.75 mg/0.5 mL subcutaneous pen injector Inject subcutaneously (Patient not taking: Reported on 12/08/2022)  ezetimibe (ZETIA) 10 mg tablet Take 10 mg by mouth once daily (Patient not taking: Reported on 12/08/2022)  fenofibrate nanocrystallized (TRICOR) 145 MG tablet Take 145 mg by mouth once daily (Patient not taking: Reported on 12/08/2022)  lactulose (ENULOSE) 10 gram/15 mL oral solution Take 30 mLs by mouth once daily (Patient not taking: Reported on 12/08/2022)  magnesium 250 mg Tab Take 500 mg by mouth at bedtime (Patient not taking: Reported on 12/08/2022)  magnesium oxide-magnesium amino acid chelate (MG-PLUS-PROTEIN) tablet Take 1 tablet by mouth at bedtime (Patient not taking: Reported on 12/08/2022)  nitroGLYcerin (NITROSTAT) 0.4 MG SL tablet Place 0.4 mg under the tongue every 5 (five) minutes as needed (Patient not taking: Reported on 12/08/2022)  ondansetron (ZOFRAN-ODT) 4 MG disintegrating tablet Take 4 mg by mouth every 8 (eight) hours as  needed (Patient not taking: Reported on 12/08/2022)  sennosides-docusate (SENOKOT-S) 8.6-50 mg tablet Take 1 tablet by mouth at bedtime (Patient not taking: Reported on 12/08/2022)  SYNJARDY 12.5-1,000 mg tablet Take 1 tablet by mouth once daily (Patient not taking: Reported on 12/08/2022)  traZODone (DESYREL) 100 MG tablet 1 tablet at bedtime Orally Once a day for 30 day(s) (Patient not taking: Reported on 12/08/2022)   Family History  Problem Relation Age of Onset  Diabetes Mother  Heart disease Mother  Breast cancer Maternal Aunt  Breast cancer Paternal Aunt  Prostate cancer Maternal Grandfather  Cancer Daughter    Social History   Tobacco Use  Smoking Status Every Day  Current packs/day: 0.50  Types: Cigarettes  Smokeless Tobacco Never  Marital status: Married  Tobacco Use  Smoking status: Every Day  Current packs/day: 0.50  Types: Cigarettes  Smokeless tobacco: Never  Vaping Use  Vaping status: Never Used  Substance and Sexual Activity  Alcohol use: Never  Drug use: Never   Objective:   Vitals:  12/08/22 0911  BP: 138/68  Pulse: 58  Temp: 36.7 C (98 F)  SpO2: 98%  Weight: 63.5 kg (140 lb)  Height: 156.2 cm (5' 1.5")  PainSc: 0-No pain  PainLoc: Abdomen  Body mass index is 26.02 kg/m.  Physical Exam Vitals reviewed.  Constitutional:  Appearance: Normal appearance.  Abdominal:  Palpations: Abdomen is soft.  Tenderness: There is no abdominal tenderness.  Comments: Healed lap incisions for chole and sternotomy Has about 2 cm epigastric hernia that is reducible, tender UH measuring 3 cm in size Neurological:  Mental Status: She is alert.   Assessment and Plan:   Incisional hernia, without obstruction or gangrene   We discussed the natural history of hernias. 62 these certainly will likely increase in size and cause her more symptoms that we discussed the small risk of incarceration as well. She would like to consider repair. I am going to need to touch  base with her cardiologist prior to beginning. She is also high risk due to her diabetes as well as her smoking history.  I think that proceeding with a laparoscopic assisted incisional hernia repair with mesh is the most reasonable. I am not sure that she has several hernias it was 1 of these feels just like a diastases on her exam. If this ends up being smaller more discrete areas we could conceivably fix this with a couple pieces of mesh. She is somewhat nervous about having mesh. We discussed the issues with mesh today. Also discussed with her repairing this with 1 large piece of mesh to cover all of the defects. We discussed the diastases that she has would not be repaired at the same time. I may also make incisions over the hernia sacs to move these so that hopefully she can avoid having a bulge at this area. We discussed the risk which include but are not limited to bleeding, infection, recurrence, reoperation as well as injury during the time of surgery requiring repair.

## 2023-02-05 NOTE — Plan of Care (Signed)
Problem: Education: Goal: Knowledge of General Education information will improve Description: Including pain rating scale, medication(s)/side effects and non-pharmacologic comfort measures Outcome: Progressing Pt understands she was admitted into the hospital for bilateral hernia repair.  She understands that she will receive IVF  per MD's orders.     Problem: Clinical Measurements: Goal: Will remain free from infection Outcome: Progressing S/Sx of infection monitored and assessed q-shift.  Pt has remained afebrile thus far.   Problem: Clinical Measurements: Goal: Respiratory complications will improve Outcome: Progressing Respiratory status monitored and assessed q-shift.  Pt is on 2L of O2 via nasal cannula with PO2 at 92% and respiration rate of 16-18 breaths per minute.  Pt has not endorsed c/o SOB or DOE.    Problem: Clinical Measurements: Goal: Cardiovascular complication will be avoided Outcome: Progressing Pt's VS WNL thus far.    Problem: Activity: Goal: Risk for activity intolerance will decrease Outcome: Progressing Pt is independent of all her ADLs.  She can get up x1 assist OOB with a steady gait with the assistance of RN staff.    Problem: Nutrition: Goal: Adequate nutrition will be maintained Outcome: Progressing Pt is on a carb modified diet per MD's orders and can tolerate it w/o s/sx of abdominal pain/ distention or n/v.    Problem: Safety: Goal: Ability to remain free from injury will improve Outcome: Progressing Pt has remained free from falls thus far.  Instructed pt to utilize RN call light for assistance.  Hourly rounds performed.  Bed in lowest position, locked with two upper side rails engaged.  Belongings and call light within reach.    Problem: Skin Integrity: Goal: Risk for impaired skin integrity will decrease Outcome: Progressing Skin integrity monitored and assessed q-shift.  Instructed pt to turn q2 hours to prevent further skin impairment.   Tubes and drains assessed for device related pressures sores.  Pt is continent of both bowel and bladder.      Problem: Pain Managment: Goal: General experience of comfort will improve Outcome: Progressing Pt has endorsed c/o 4-10/10 right sided abdominal pain r/t her bilateral hernia repair.  Reiterated pain scale so she could adequately rate her pain.  Pt stated her pain goal this admission would be 0/10.  Discussed nonpharmacological methods to help reduce s/sx of pain.  Interventions given per pt's request and MD's orders.

## 2023-02-05 NOTE — Anesthesia Procedure Notes (Signed)
Procedure Name: Intubation Date/Time: 02/05/2023 8:01 AM  Performed by: April Holding, CRNAPre-anesthesia Checklist: Patient identified, Emergency Drugs available, Suction available and Patient being monitored Patient Re-evaluated:Patient Re-evaluated prior to induction Oxygen Delivery Method: Circle System Utilized Preoxygenation: Pre-oxygenation with 100% oxygen Induction Type: IV induction Ventilation: Mask ventilation without difficulty Laryngoscope Size: [name] and 2 Grade View: Grade I Tube type: Oral Tube size: 7.0 mm Number of attempts: 1 Airway Equipment and Method: Stylet and Oral airway Placement Confirmation: ETT inserted through vocal cords under direct vision, positive ETCO2 and breath sounds checked- equal and bilateral Secured at: 21 cm Tube secured with: Tape Dental Injury: Teeth and Oropharynx as per pre-operative assessment

## 2023-02-05 NOTE — Discharge Instructions (Signed)
[facility] IN[facility]  A  prescription for pain medication may be given to you upon discharge.  Take your pain medication as prescribed, if needed.  If narcotic pain medicine is not needed, then you may take acetaminophen (Tylenol), naprosyn (Alleve) or ibuprofen (Advil) as needed. Take your usually prescribed medications unless otherwise directed. If you need a refill on your pain medication, please contact your pharmacy.  They will contact our office to request authorization. Prescriptions will not be filled after 5 pm or on week-ends. You should follow a light diet the first 24 hours after arrival home, such as soup and crackers, etc.  Be sure to include lots of fluids daily.  Resume your normal diet the day after surgery. Most patients will experience some swelling and bruising around the umbilicus or in the groin and scrotum.  Ice packs and reclining will help.  Swelling and bruising can take several days to resolve.  It is common to experience some constipation if taking pain medication after surgery.  Increasing fluid intake and taking a stool softener (such as Colace) will usually help or prevent this problem from occurring.  A mild laxative (Milk of Magnesia or Miralax) should be taken according to package directions if there are no bowel movements after 48 hours. Unless discharge instructions indicate otherwise, you may remove your bandages 48 hours after surgery, and you may shower at that time.  You may have steri-strips (small skin tapes) in place directly over the incision.  These strips should be left on the skin for 7-10 days and will come off on their own.  If your surgeon used skin glue on the incision, you may shower in 24 hours.  The glue will flake off over the next 2-3 weeks.  Any sutures or staples will be removed at the office during your follow-up visit. ACTIVITIES:  You may resume regular (light) daily activities beginning the next day--such as  daily self-care, walking, climbing stairs--gradually increasing activities as tolerated.  You may have sexual intercourse when it is comfortable.  Refrain from any heavy lifting or straining until approved by your doctor. You may drive when you are no longer taking prescription pain medication, you can comfortably wear a seatbelt, and you can safely maneuver your car and apply brakes. RETURN TO WORK:  __________________________________________________________ Bonita Quin should see your doctor in the office for a follow-up appointment approximately 2-3 weeks after your surgery.  Make sure that you call for this appointment within a day or two after you arrive home to insure a convenient appointment time. OTHER INSTRUCTIONS:  __________________________________________________________________________________________________________________________________________________________________________________________  WHEN TO CALL YOUR DOCTOR: Fever over 101.0 Inability to urinate Nausea and/or vomiting Extreme swelling or bruising Continued bleeding from incision. Increased pain, redness, or drainage from the incision  The clinic staff is available to answer your questions during regular business hours.  Please don't hesitate to call and ask to speak to one of the nurses for clinical concerns.  If you have a medical emergency, go to the nearest emergency room or call 911.  A surgeon from [facility] is always on call at the hospital   [address], [address], [place], [place]  [address] ?  [address] [address], [place], [place]   [address] [phone] ? [phone] ? FAX [phone] Web site: [email or web address]

## 2023-02-05 NOTE — Progress Notes (Signed)
Pt admitted from the PACU for bilateral hernia repair . Pt was transported via wheelchair accompanied by a transporter. Assisted pt into her room and obtained VS. Reviewed POC with pt and made her aware of her new surroundings. Informed pt to utilize RN call light for assistance. Bed locked in lowest position, with two upper side rails engaged. Belongings and call light within reach.

## 2023-02-05 NOTE — Anesthesia Procedure Notes (Signed)
Anesthesia Regional Block: TAP block   Pre-Anesthetic Checklist: , timeout performed,  Correct Patient, Correct Site, Correct Laterality,  Correct Procedure, Correct Position, site marked,  Risks and benefits discussed,  Surgical consent,  Pre-op evaluation,  At surgeon's request and post-op pain management  Laterality: Left  Prep: chloraprep       Needles:  Injection technique: Single-shot  Needle Type: Echogenic Needle     Needle Length: 9cm  Needle Gauge: 21     Additional Needles:   Narrative:  Start time: 02/05/2023 7:33 AM End time: 02/05/2023 7:37 AM Injection made incrementally with aspirations every 5 mL.  Performed by: Personally  Anesthesiologist: Achille Rich, MD  Additional Notes: Pt tolerated the procedure well.

## 2023-02-05 NOTE — Op Note (Signed)
Preoperative diagnosis: Epigastric incisional hernia, umbilical hernia- total hernia size is 13 cm x 2 cm Postoperative diagnosis: Same as above Procedure: 1.  Diagnostic laparoscopy with 30 minutes of lysis of adhesions 2.  Laparoscopic assisted epigastric hernia repair with 8 cm Ventralex patch, this hernia measured 2.5 cm 3.  Laparoscopic assisted incisional umbilical hernia repair with 8 cm Ventralex patch, this hernia measured 2 cm Surgeon: Dr. Harden [name] Anesthesia: General Complications: None Drains: None Specimens: None Estimated blood loss: Minimal Sponge needle count was correct completion Disposition to recovery stable addition  Indications: This is a 62 year old female with a history of a laparoscopic cholecystectomy and a coronary bypass in the past.  She notes a mass in her epigastric region as well as her umbilicus.  These both have been bothering her.  She underwent a CT scan in March and so some fat stranding around the umbilicus as well as an epigastric hernia.  She underwent an MRI that showed an epigastric hernia with a 2 and half centimeter defect and umbilical hernia with a 2 cm defect with the possibility of another hernia between those.  We discussed proceeding with diagnostic laparoscopy and repair with mesh.  Procedure: After informed consent was obtained she was taken to the operating room.  She was placed under general anesthesia after undergoing a tap block.  She was given antibiotics.  SCDs were in place.  She was prepped and draped in a standard sterile surgical fashion.  Surgical timeout was then performed.  [name] was placed over her abdomen.  I then infiltrated some Marcaine in the left upper quadrant.  I made a small incision.  I then entered into the abdomen with a 5 mm direct optical entry trocar.  This was done without injury.  The abdomen was insufflated 15 mmHg pressure.  I then placed 2 additional 5 mm trocars in the left side of the abdomen.  She had a  fair amount of adhesions from her omentum to the abdominal wall.  To me about 30 minutes I took these down with a combination of sharp dissection as well as the harmonic scalpel when I was sure there was no bowel present.  I was able to free up the entire umbilical hernia.  I then took the adhesions down to the epigastric hernia.  The transverse colon was near this but it was just the omentum that was incarcerated.  I completely reduce this and took the falciform ligament down as well.  Once I done this I measured the defect send I did look to be right about what they appear to be on the MRI.  There were no other hernias noted in her abdominal wall.  She had a small amount of some diastases.  I elected to fix these both separately with an 8 cm Ventralex patch.  I then cut down over the epigastric hernia.  I removed the very large hernia sac which was bothering her.  I then placed an 8 cm Ventralex patch into the abdomen.  I closed the hernia defect with #1 Novafil suture and tacked the ends down.  I then looked at this with the laparoscope.  This was in good position.  I did use secure strap tacks to adhere to the abdominal wall.  This completely covered the defect with good overlap.  I then did the same thing with the umbilicus.  I made a curvilinear incision below the umbilicus and took this down dividing the stalk.  I closed the defect with #  1 Novafil and placed an 8 cm Ventralex patch in there as well.  I used the secure strap to tack it to the abdominal wall.  Both of these were in good position and appeared to completely obliterate the defect.  Hemostasis was observed.  I then removed the trocars and desufflated the abdomen.  I then closed the 2 larger incisions with 3-0 Vicryl and 4-0 Monocryl.  The laparoscopic incisions were closed with 4-0 Monocryl.  Benzoin and Steri-Strips were applied.  Dressings were placed due to her allergy to surgical glue.  She was then awakened and transferred recovery stable.

## 2023-02-05 NOTE — Transfer of Care (Signed)
Immediate Anesthesia Transfer of Care Note  Patient: [name]  Procedure(s) Performed: LAPAROSCOPIC INCISIONAL HERNIA REPAIR WITH MESH x2  Patient Location: PACU  Anesthesia Type:General and Regional  Level of Consciousness: drowsy  Airway & Oxygen Therapy: Patient Spontanous Breathing and Patient connected to nasal cannula oxygen  Post-op Assessment: Report given to RN and Post -op Vital signs reviewed and stable  Post vital signs: Reviewed and stable  Last Vitals:  Vitals Value Taken Time  BP 122/50 02/05/23 0954  Temp    Pulse 53 02/05/23 0955  Resp 16 02/05/23 0955  SpO2 89 % 02/05/23 0955  Vitals shown include unfiled device data.  Last Pain:  Vitals:   02/05/23 0716  TempSrc:   PainSc: 8       Patients Stated Pain Goal: 2 (02/05/23 0716)  Complications: No notable events documented.

## 2023-02-05 NOTE — Interval H&P Note (Signed)
History and Physical Interval Note:  02/05/2023 7:05 AM  [name]  has presented today for surgery, with the diagnosis of INCISIONAL HERNIAS, EPIGASTRIC AND UMBILCUS.  The various methods of treatment have been discussed with the patient and family. After consideration of risks, benefits and other options for treatment, the patient has consented to  Procedure(s): LAPAROSCOPIC INCISIONAL HERNIA REPAIR WITH MESH x2 (N/A) as a surgical intervention.  The patient's history has been reviewed, patient examined, no change in status, stable for surgery.  I have reviewed the patient's chart and labs.  Questions were answered to the patient's satisfaction.     [name]

## 2023-02-05 NOTE — Anesthesia Procedure Notes (Signed)
Anesthesia Regional Block: TAP block   Pre-Anesthetic Checklist: , timeout performed,  Correct Patient, Correct Site, Correct Laterality,  Correct Procedure, Correct Position, site marked,  Risks and benefits discussed,  Surgical consent,  Pre-op evaluation,  At surgeon's request and post-op pain management  Laterality: Right  Prep: chloraprep       Needles:  Injection technique: Single-shot  Needle Type: Echogenic Needle     Needle Length: 9cm  Needle Gauge: 21     Additional Needles:   Narrative:  Start time: 02/05/2023 7:37 AM End time: 02/05/2023 7:43 AM Injection made incrementally with aspirations every 5 mL.  Performed by: Personally  Anesthesiologist: Achille Rich, MD  Additional Notes: Pt tolerated the procedure well.

## 2023-02-06 ENCOUNTER — Encounter (HOSPITAL_COMMUNITY): Payer: Self-pay | Admitting: General Surgery

## 2023-02-06 DIAGNOSIS — K432 Incisional hernia without obstruction or gangrene: Secondary | ICD-10-CM | POA: Diagnosis not present

## 2023-02-06 LAB — GLUCOSE, CAPILLARY
Glucose-Capillary: 135 mg/dL — ABNORMAL HIGH (ref 70–99)
Glucose-Capillary: 130 mg/dL — ABNORMAL HIGH (ref 70–99)

## 2023-02-06 MED ORDER — OXYCODONE HCL 5 MG PO TABS
10.0000 mg | ORAL_TABLET | Freq: Four times a day (QID) | ORAL | 0 refills | Status: AC | PRN
Start: 2023-02-06 — End: ?

## 2023-02-06 NOTE — Plan of Care (Signed)
Patient alert/oriented X4. Patient compliant with medication administration and AVS discharge instructions were explained in detail to patient. Patient PIV removed prior to discharge and patient refused dressing change due to needing to the urgency of attending to her cat. Patient given home wound care supplies to take home with.   Problem: Education: Goal: Ability to describe self-care measures that may prevent or decrease complications (Diabetes Survival Skills Education) will improve Outcome: Adequate for Discharge   Problem: Education: Goal: Individualized Educational Video(s) Outcome: Adequate for Discharge   Problem: Coping: Goal: Ability to adjust to condition or change in health will improve Outcome: Adequate for Discharge   Problem: Fluid Volume: Goal: Ability to maintain a balanced intake and output will improve Outcome: Adequate for Discharge   Problem: Health Behavior/Discharge Planning: Goal: Ability to identify and utilize available resources and services will improve Outcome: Adequate for Discharge   Problem: Nutritional: Goal: Maintenance of adequate nutrition will improve Outcome: Adequate for Discharge   Problem: Nutritional: Goal: Progress toward achieving an optimal weight will improve Outcome: Adequate for Discharge   Problem: Skin Integrity: Goal: Risk for impaired skin integrity will decrease Outcome: Adequate for Discharge   Problem: Tissue Perfusion: Goal: Adequacy of tissue perfusion will improve Outcome: Adequate for Discharge   Problem: Education: Goal: Knowledge of General Education information will improve Description: Including pain rating scale, medication(s)/side effects and non-pharmacologic comfort measures Outcome: Adequate for Discharge   Problem: Health Behavior/Discharge Planning: Goal: Ability to manage health-related needs will improve Outcome: Adequate for Discharge   Problem: Clinical Measurements: Goal: Ability to maintain  clinical measurements within normal limits will improve Outcome: Adequate for Discharge   Problem: Clinical Measurements: Goal: Will remain free from infection Outcome: Adequate for Discharge   Problem: Clinical Measurements: Goal: Diagnostic test results will improve Outcome: Adequate for Discharge   Problem: Clinical Measurements: Goal: Respiratory complications will improve Outcome: Adequate for Discharge   Problem: Clinical Measurements: Goal: Cardiovascular complication will be avoided Outcome: Adequate for Discharge   Problem: Activity: Goal: Risk for activity intolerance will decrease Outcome: Adequate for Discharge   Problem: Nutrition: Goal: Adequate nutrition will be maintained Outcome: Adequate for Discharge   Problem: Coping: Goal: Level of anxiety will decrease Outcome: Adequate for Discharge   Problem: Elimination: Goal: Will not experience complications related to bowel motility Outcome: Adequate for Discharge   Problem: Elimination: Goal: Will not experience complications related to urinary retention Outcome: Adequate for Discharge   Problem: Pain Managment: Goal: General experience of comfort will improve Outcome: Adequate for Discharge   Problem: Safety: Goal: Ability to remain free from injury will improve Outcome: Adequate for Discharge   Problem: Skin Integrity: Goal: Risk for impaired skin integrity will decrease Outcome: Adequate for Discharge

## 2023-02-06 NOTE — Anesthesia Postprocedure Evaluation (Signed)
Anesthesia Post Note  Patient: [name]  Procedure(s) Performed: LAPAROSCOPIC INCISIONAL HERNIA REPAIR WITH MESH x2     Patient location during evaluation: PACU Anesthesia Type: Regional and General Level of consciousness: awake and alert Pain management: pain level controlled Vital Signs Assessment: post-procedure vital signs reviewed and stable Respiratory status: spontaneous breathing, nonlabored ventilation, respiratory function stable and patient connected to nasal cannula oxygen Cardiovascular status: blood pressure returned to baseline and stable Postop Assessment: no apparent nausea or vomiting Anesthetic complications: no   No notable events documented.  Last Vitals:  Vitals:   02/06/23 0043 02/06/23 0520  BP: (!) 100/50 127/64  Pulse: (!) 59 (!) 54  Resp: 16 18  Temp: 36.8 C 36.9 C  SpO2: 94% 92%    Last Pain:  Vitals:   02/06/23 0800  TempSrc:   PainSc: 2                  Jonathon Tan S

## 2023-02-06 NOTE — Discharge Summary (Signed)
Physician Discharge Summary  Patient ID: [name] MRN: [id number] DOB/AGE: 11-27-1960 62 y.o.  Admit date: 02/05/2023 Discharge date: 02/06/2023  Admission Diagnoses: CAD Incisional hernias HTN Hypothyroid  Discharge Diagnoses:  Principal Problem:   Ventral hernia   Discharged Condition: good  Hospital Course: 62 yof s/p lap loa and incisional hernia repair times two with mesh.  She has done well.  Passing flatus.  Tolerating diet, pain controlled. Plan for dc home this am  Consults: None  Significant Diagnostic Studies: none  Treatments: surgery: lap loa and incisional hernia repair times two with mesh  Discharge Exam: Blood pressure 127/64, pulse (!) 54, temperature 98.5 F (36.9 C), temperature source Oral, resp. rate 18, height 5\' 1"  (1.549 m), weight 62 kg, SpO2 92%. General nad Pulm effort normal Cv regular Ab approp tender incisions clean, soft  Disposition: Discharge disposition: 01-Home or Self Care        Allergies as of 02/06/2023       Reactions   Latuda [lurasidone Hcl] Shortness Of Breath   Coconut Flavor Swelling, Rash   Codeine Nausea And Vomiting   "I get deathly sick , vomitting"   Ibuprofen Swelling, Rash   Prednisone Swelling, Rash   Cyclobenzaprine    "almost made heart stop, couldn't breathe"   Meloxicam Itching   Other    Surgical glue   Morphine Nausea And Vomiting        Medication List     TAKE these medications    Accu-Chek FastClix Lancet Kit 1 each by Does not apply route 3 (three) times daily. Use to monitor glucose levels TID; E11.9   Accu-Chek Guide Control Liqd 1 each by Does not apply route as needed. Use to calibrate glucometer prn; E11.9   aspirin EC 81 MG tablet Take 81 mg by mouth daily. Swallow whole.   atenolol 25 MG tablet Commonly known as: TENORMIN Take 25 mg by mouth daily.   carvedilol 3.125 MG tablet Commonly known as: COREG TAKE 1 TABLET TWICE DAILY   diazepam 5 MG  tablet Commonly known as: VALIUM Take 5 mg by mouth at bedtime as needed (sleep).   dicyclomine 20 MG tablet Commonly known as: BENTYL Take 1 tablet (20 mg total) by mouth every 6 (six) hours.   diphenhydrAMINE 25 MG tablet Commonly known as: BENADRYL Take 1 tablet (25 mg total) by mouth every 6 (six) hours as needed.   doxycycline 100 MG capsule Commonly known as: VIBRAMYCIN Take 1 capsule (100 mg total) by mouth 2 (two) times daily.   DULoxetine 60 MG capsule Commonly known as: CYMBALTA Take 60 mg by mouth 2 (two) times daily.   ezetimibe 10 MG tablet Commonly known as: ZETIA Take 1 tablet (10 mg total) by mouth daily. Please make overdue appt with Dr. Elease Hashimoto before anymore refills. 1st attempt   famotidine 20 MG tablet Commonly known as: PEPCID Take 1 tablet (20 mg total) by mouth 2 (two) times daily.   fenofibrate 145 MG tablet Commonly known as: TRICOR Take 1 tablet (145 mg total) by mouth daily. Please make overdue appt with Dr. Elease Hashimoto before anymore refills. 1st attempt   FISH OIL PO Take 2,400 mg by mouth at bedtime.   glucose blood test strip Commonly known as: Accu-Chek Guide Use to check blood sugar 3 times daily.   hydrocortisone 25 MG suppository Commonly known as: ANUSOL-HC Place 1 suppository (25 mg total) rectally 2 (two) times daily.   lactulose 10 GM/15ML solution Commonly known as:  CHRONULAC Take 30 mLs (20 g total) by mouth daily as needed for mild constipation. What changed:  how much to take when to take this   levothyroxine 125 MCG tablet Commonly known as: SYNTHROID Take 125 mcg by mouth daily before breakfast.   lidocaine 2 % solution Commonly known as: XYLOCAINE Use as directed 15 mLs in the mouth or throat as needed for mouth pain.   Magnesium 250 MG Tabs Take 500 mg by mouth at bedtime.   naloxone 4 MG/0.1ML Liqd nasal spray kit Commonly known as: NARCAN Place 1 spray into the nose as needed (opioid overdose).    nitroGLYCERIN 0.4 MG SL tablet Commonly known as: NITROSTAT Place 0.4 mg under the tongue every 5 (five) minutes as needed for chest pain.   omeprazole 40 MG capsule Commonly known as: PRILOSEC TAKE 1 CAPSULE DAILY 30 MINUTES BEFORE A MEAL   ondansetron 4 MG disintegrating tablet Commonly known as: ZOFRAN-ODT Take 1 tablet (4 mg total) by mouth every 8 (eight) hours as needed for nausea or vomiting.   Oxycodone HCl 10 MG Tabs Take 10 mg by mouth every 4 (four) hours as needed (pain). What changed: Another medication with the same name was added. Make sure you understand how and when to take each.   oxyCODONE 5 MG immediate release tablet Commonly known as: Oxy IR/ROXICODONE Take 2 tablets (10 mg total) by mouth every 6 (six) hours as needed. What changed: You were already taking a medication with the same name, and this prescription was added. Make sure you understand how and when to take each.   pantoprazole 40 MG tablet Commonly known as: PROTONIX Take 40 mg by mouth daily.   polyethylene glycol 17 g packet Commonly known as: MIRALAX / GLYCOLAX Take 17 g by mouth in the morning.   pregabalin 100 MG capsule Commonly known as: LYRICA Take 100 mg by mouth 3 (three) times daily.   Procto-Med HC 2.5 % rectal cream Generic drug: hydrocortisone Place 1 application. rectally daily as needed for pain.   rosuvastatin 40 MG tablet Commonly known as: CRESTOR Take 40 mg by mouth at bedtime.   sucralfate 1 g tablet Commonly known as: CARAFATE Take 1 g by mouth 3 (three) times daily.   Synjardy 12.08-998 MG Tabs Generic drug: Empagliflozin-metFORMIN HCl Take 1 tablet by mouth daily.   tiZANidine 4 MG tablet Commonly known as: ZANAFLEX Take 4 mg by mouth 3 (three) times daily as needed for muscle spasms.   Trulicity 1.5 MG/0.5ML Sopn Generic drug: Dulaglutide Inject 1.5 mg into the skin every Saturday.   Vitamin D (Ergocalciferol) 1.25 MG (50000 UNIT) Caps  capsule Commonly known as: DRISDOL Take 50,000 Units by mouth every 7 (seven) days.        Follow-up Information     [name], MD Follow up in 3 week(s).   Specialty: General Surgery Contact information: [address] [place] [address] [phone]                 Signed: [name] 02/06/2023, 7:02 AM

## 2023-04-26 ENCOUNTER — Other Ambulatory Visit: Payer: Self-pay | Admitting: Surgery

## 2023-04-26 DIAGNOSIS — R1012 Left upper quadrant pain: Secondary | ICD-10-CM

## 2023-04-27 ENCOUNTER — Ambulatory Visit
Admission: RE | Admit: 2023-04-27 | Discharge: 2023-04-27 | Disposition: A | Discharge status: Home / Self Care | Payer: Medicare HMO | Source: Ambulatory Visit | Attending: Surgery

## 2023-04-27 DIAGNOSIS — R1012 Left upper quadrant pain: Secondary | ICD-10-CM | POA: Diagnosis present

## 2023-04-27 LAB — POCT I-STAT CREATININE: Creatinine, Ser: 0.6 mg/dL (ref 0.44–1.00)

## 2023-04-27 MED ORDER — IOHEXOL 300 MG/ML  SOLN
80.0000 mL | Freq: Once | INTRAMUSCULAR | Status: AC | PRN
  Administered 2023-04-27: 80 mL via INTRAVENOUS

## 2024-02-11 ENCOUNTER — Encounter (HOSPITAL_COMMUNITY): Payer: Self-pay

## 2024-02-11 ENCOUNTER — Emergency Department (HOSPITAL_COMMUNITY)
Admission: EM | Admit: 2024-02-11 | Discharge: 2024-02-12 | Discharge status: Against Medical Advice | Attending: Emergency Medicine | Admitting: Emergency Medicine

## 2024-02-11 ENCOUNTER — Other Ambulatory Visit: Payer: Self-pay

## 2024-02-11 ENCOUNTER — Emergency Department (HOSPITAL_COMMUNITY)

## 2024-02-11 DIAGNOSIS — E1165 Type 2 diabetes mellitus with hyperglycemia: Secondary | ICD-10-CM | POA: Diagnosis present

## 2024-02-11 DIAGNOSIS — E039 Hypothyroidism, unspecified: Secondary | ICD-10-CM | POA: Insufficient documentation

## 2024-02-11 DIAGNOSIS — R252 Cramp and spasm: Secondary | ICD-10-CM | POA: Insufficient documentation

## 2024-02-11 DIAGNOSIS — Z5321 Procedure and treatment not carried out due to patient leaving prior to being seen by health care provider: Secondary | ICD-10-CM | POA: Diagnosis not present

## 2024-02-11 DIAGNOSIS — R0789 Other chest pain: Secondary | ICD-10-CM | POA: Insufficient documentation

## 2024-02-11 DIAGNOSIS — R110 Nausea: Secondary | ICD-10-CM | POA: Diagnosis not present

## 2024-02-11 LAB — URINALYSIS, ROUTINE W REFLEX MICROSCOPIC
Bacteria, UA: NONE SEEN
Bilirubin Urine: NEGATIVE
Glucose, UA: >=500 mg/dL — AB
Hgb urine dipstick: NEGATIVE
Ketones, ur: NEGATIVE mg/dL
Leukocytes,Ua: NEGATIVE
Nitrite: NEGATIVE
Protein, ur: NEGATIVE mg/dL
Specific Gravity, Urine: 1.006 (ref 1.005–1.030)
pH: 6 (ref 5.0–8.0)

## 2024-02-11 LAB — BASIC METABOLIC PANEL WITH GFR
Anion gap: 13 (ref 5–15)
BUN: 17 mg/dL (ref 8–23)
CO2: 22 mmol/L (ref 22–32)
Calcium: 8.8 mg/dL — ABNORMAL LOW (ref 8.9–10.3)
Chloride: 103 mmol/L (ref 98–111)
Creatinine, Ser: 0.97 mg/dL (ref 0.44–1.00)
GFR, Estimated: >60 mL/min (ref >60)
Glucose, Bld: 182 mg/dL — ABNORMAL HIGH (ref 70–99)
Potassium: 4 mmol/L (ref 3.5–5.1)
Sodium: 138 mmol/L (ref 135–145)

## 2024-02-11 LAB — CBG MONITORING, ED: Glucose-Capillary: 191 mg/dL — ABNORMAL HIGH (ref 70–99)

## 2024-02-11 LAB — CBC
HCT: 48.9 % — ABNORMAL HIGH (ref 36.0–46.0)
Hemoglobin: 15.8 g/dL — ABNORMAL HIGH (ref 12.0–15.0)
MCH: 27.8 pg (ref 26.0–34.0)
MCHC: 32.3 g/dL (ref 30.0–36.0)
MCV: 86.1 fL (ref 80.0–100.0)
Platelets: 218 K/uL (ref 150–400)
RBC: 5.68 MIL/uL — ABNORMAL HIGH (ref 3.87–5.11)
RDW: 14.7 % (ref 11.5–15.5)
WBC: 8.3 K/uL (ref 4.0–10.5)
nRBC: 0 % (ref 0.0–0.2)

## 2024-02-11 LAB — TROPONIN I (HIGH SENSITIVITY): Troponin I (High Sensitivity): 4 ng/L (ref <18)

## 2024-02-11 NOTE — ED Triage Notes (Signed)
 Arrives [facility] from home with c/o hyperglycemia, chest pain, dizziness, and lower leg cramps.   EMS admin 500cc NS prior to arrival.   Woke up this morning with CBG 200 that has progressed throughout the day. Had a steroid injection this morning.

## 2024-02-11 NOTE — ED Provider Triage Note (Signed)
 Emergency Medicine Provider Triage Evaluation Note  [name] , a 63 y.o. female  was evaluated in triage.  Pt complains of this patient is a 63 year old female with a history of hypothyroidism and diabetes.,  She is followed by endocrinology, she has had a recent follow-up that showed that her thyroid  was a little bit overactive and her levothyroxine  was decreased in dosing, she has also had some hyperglycemia the last couple of days with today blood sugar measuring as high as 400 briefly.  She called the paramedics because she was having a vague chest discomfort, she was feeling weak and dizzy and lightheaded, she felt like she was having cramps in all 4 extremities  Review of Systems  Positive: Cramps, nausea Negative: Fevers, vomiting, diarrhea, abdominal pain, coughing or shortness of breath  Physical Exam  BP 131/63 (BP Location: Right Arm)   Pulse 66   Temp 98.1 F (36.7 C)   Resp 14   Ht 1.549 m (5' 1)   Wt 61.2 kg   SpO2 97%   BMI 25.51 kg/m  Gen:   Awake, no distress well-appearing, speaks normally Resp:  Normal effort normal lung sounds MSK:   Moves extremities without difficulty no edema, normal strength Other:  Awake alert and able to perform all the commands, she has normal cranial nerves III through XII, normal speech, coordination, strength in all 4 extremities  Medical Decision Making  Medically screening exam initiated at 9:19 PM.  Appropriate orders placed.  [name] was informed that the remainder of the evaluation will be completed by another provider, this initial triage assessment does not replace that evaluation, and the importance of remaining in the ED until their evaluation is complete.  Exam is unremarkable, vital signs are unremarkable, check labs and EKG   Cleotilde Rogue, MD 02/11/24 2328

## 2024-02-12 LAB — TROPONIN I (HIGH SENSITIVITY): Troponin I (High Sensitivity): 5 ng/L (ref <18)

## 2024-02-12 NOTE — ED Notes (Signed)
 Called pt 3x for vitals, and received no response.

## 2024-05-14 ENCOUNTER — Other Ambulatory Visit: Payer: Self-pay | Admitting: Nurse Practitioner

## 2024-05-14 DIAGNOSIS — Z1231 Encounter for screening mammogram for malignant neoplasm of breast: Secondary | ICD-10-CM

## 2024-05-22 ENCOUNTER — Ambulatory Visit
Admission: RE | Admit: 2024-05-22 | Discharge: 2024-05-22 | Disposition: A | Discharge status: Home / Self Care | Source: Ambulatory Visit | Attending: Nurse Practitioner | Admitting: Nurse Practitioner

## 2024-05-22 DIAGNOSIS — Z1231 Encounter for screening mammogram for malignant neoplasm of breast: Secondary | ICD-10-CM
